# Patient Record
Sex: Male | Born: 1950 | Race: White | Hispanic: No | Marital: Single | Smoking: Never smoker
Health system: Southern US, Community
[De-identification: ages and names within clinical notes are randomized; demographics above are authoritative.]

## PROBLEM LIST (undated history)

## (undated) DIAGNOSIS — E538 Deficiency of other specified B group vitamins: Secondary | ICD-10-CM

## (undated) DIAGNOSIS — C8800 Waldenstrom macroglobulinemia not having achieved remission: Secondary | ICD-10-CM

## (undated) DIAGNOSIS — F0390 Unspecified dementia without behavioral disturbance: Secondary | ICD-10-CM

## (undated) DIAGNOSIS — I7789 Other specified disorders of arteries and arterioles: Secondary | ICD-10-CM

## (undated) DIAGNOSIS — H409 Unspecified glaucoma: Secondary | ICD-10-CM

## (undated) DIAGNOSIS — R350 Frequency of micturition: Secondary | ICD-10-CM

## (undated) DIAGNOSIS — N400 Enlarged prostate without lower urinary tract symptoms: Secondary | ICD-10-CM

## (undated) DIAGNOSIS — I10 Essential (primary) hypertension: Secondary | ICD-10-CM

## (undated) DIAGNOSIS — G629 Polyneuropathy, unspecified: Secondary | ICD-10-CM

## (undated) DIAGNOSIS — E119 Type 2 diabetes mellitus without complications: Secondary | ICD-10-CM

## (undated) HISTORY — DX: Frequency of micturition: R35.0

## (undated) HISTORY — DX: Deficiency of other specified B group vitamins: E53.8

## (undated) HISTORY — DX: Other specified disorders of arteries and arterioles: I77.89

## (undated) HISTORY — PX: KNEE ARTHROSCOPY: SUR90

## (undated) HISTORY — DX: Essential (primary) hypertension: I10

## (undated) HISTORY — PX: LASIK: SHX215

## (undated) HISTORY — PX: COLONOSCOPY: SHX174

## (undated) HISTORY — PX: CATARACT EXTRACTION W/ INTRAOCULAR LENS IMPLANT: SHX1309

---

## 2015-07-01 ENCOUNTER — Encounter: Payer: Self-pay | Admitting: *Deleted

## 2015-07-01 ENCOUNTER — Emergency Department
Admission: EM | Admit: 2015-07-01 | Discharge: 2015-07-01 | Disposition: A | Discharge status: Home / Self Care | Payer: 59 | Attending: Emergency Medicine | Admitting: Emergency Medicine

## 2015-07-01 ENCOUNTER — Emergency Department: Payer: 59

## 2015-07-01 DIAGNOSIS — R410 Disorientation, unspecified: Secondary | ICD-10-CM | POA: Insufficient documentation

## 2015-07-01 DIAGNOSIS — R739 Hyperglycemia, unspecified: Secondary | ICD-10-CM

## 2015-07-01 DIAGNOSIS — R61 Generalized hyperhidrosis: Secondary | ICD-10-CM | POA: Insufficient documentation

## 2015-07-01 DIAGNOSIS — G478 Other sleep disorders: Secondary | ICD-10-CM | POA: Insufficient documentation

## 2015-07-01 DIAGNOSIS — E538 Deficiency of other specified B group vitamins: Secondary | ICD-10-CM | POA: Diagnosis not present

## 2015-07-01 DIAGNOSIS — H409 Unspecified glaucoma: Secondary | ICD-10-CM | POA: Insufficient documentation

## 2015-07-01 DIAGNOSIS — G479 Sleep disorder, unspecified: Secondary | ICD-10-CM

## 2015-07-01 DIAGNOSIS — R413 Other amnesia: Secondary | ICD-10-CM | POA: Diagnosis present

## 2015-07-01 HISTORY — DX: Unspecified glaucoma: H40.9

## 2015-07-01 HISTORY — DX: Benign prostatic hyperplasia without lower urinary tract symptoms: N40.0

## 2015-07-01 LAB — COMPREHENSIVE METABOLIC PANEL
ALBUMIN: 3.8 g/dL (ref 3.5–5.0)
ALT: 11 U/L — ABNORMAL LOW (ref 17–63)
ANION GAP: 8 (ref 5–15)
AST: 18 U/L (ref 15–41)
Alkaline Phosphatase: 52 U/L (ref 38–126)
BILIRUBIN TOTAL: 0.7 mg/dL (ref 0.3–1.2)
BUN: 13 mg/dL (ref 6–20)
CALCIUM: 9.2 mg/dL (ref 8.9–10.3)
CO2: 28 mmol/L (ref 22–32)
Chloride: 103 mmol/L (ref 101–111)
Creatinine, Ser: 1.13 mg/dL (ref 0.61–1.24)
GLUCOSE: 152 mg/dL — AB (ref 65–99)
POTASSIUM: 3.8 mmol/L (ref 3.5–5.1)
Sodium: 139 mmol/L (ref 135–145)
TOTAL PROTEIN: 8.2 g/dL — AB (ref 6.5–8.1)

## 2015-07-01 LAB — ETHANOL: Alcohol, Ethyl (B): <5 mg/dL (ref <5)

## 2015-07-01 LAB — CBC
HCT: 42.3 % (ref 40.0–52.0)
HEMOGLOBIN: 13.7 g/dL (ref 13.0–18.0)
MCH: 30.2 pg (ref 26.0–34.0)
MCHC: 32.4 g/dL (ref 32.0–36.0)
MCV: 93.2 fL (ref 80.0–100.0)
Platelets: 299 10*3/uL (ref 150–440)
RBC: 4.54 MIL/uL (ref 4.40–5.90)
RDW: 13.5 % (ref 11.5–14.5)
WBC: 7 10*3/uL (ref 3.8–10.6)

## 2015-07-01 LAB — URINE DRUG SCREEN, QUALITATIVE (ARMC ONLY)
AMPHETAMINES, UR SCREEN: NOT DETECTED
BENZODIAZEPINE, UR SCRN: NOT DETECTED
Barbiturates, Ur Screen: NOT DETECTED
CANNABINOID 50 NG, UR ~~LOC~~: NOT DETECTED
Cocaine Metabolite,Ur ~~LOC~~: NOT DETECTED
MDMA (Ecstasy)Ur Screen: NOT DETECTED
METHADONE SCREEN, URINE: NOT DETECTED
OPIATE, UR SCREEN: NOT DETECTED
PHENCYCLIDINE (PCP) UR S: NOT DETECTED
TRICYCLIC, UR SCREEN: NOT DETECTED

## 2015-07-01 NOTE — ED Notes (Signed)
Patient reports His doctor in [place] had drawn labs and reported he needed vitamin B-12. Patient came to [place] to visit parents and came to ER to receive B-12 injection if needed. Patient has glaucoma and has some blurry vision and is currently on meds for Glaucoma.

## 2015-07-01 NOTE — ED Notes (Addendum)
Pt reports has had confusion over past few months over dates/numbers/calendars. PCP in [place], [place] believes it is due to Vitamin B12 deficiencies and is needing injections.

## 2015-07-01 NOTE — ED Provider Notes (Signed)
[facility] Emergency Department Provider Note  ____________________________________________  Time seen: Approximately 5:43 PM  I have reviewed the triage vital signs and the nursing notes.   HISTORY  Chief Complaint Memory Loss    HPI Adrian Valdez is a 64 y.o. male with a history of BPH and glaucoma presenting for 2 years of progressive memory loss. The patient normally lives in [place] and is currently living with his parents; they are in the emergency department today "to get evaluated faster because the primary care doctor cannot see [place] until next week." Both the patient and his father who are here denies any new symptoms today. The patient describes that over the past 2 years she has had a progressive decline in his vision due to glaucoma which has made it difficult for him to drive to work. In addition he has had episodes where he sees numbers" it turns into a different number." His father states that the patient has difficulty performing his ADLs and cannot administer his own medications." He is not safe to live by himself as why he is living with [place]." The patient has been evaluated by his primary care physician in [place] and has blood work including negative HIV negative RPR CBC and chemistry that are within normal limits. He has a vitamin B12 deficiency and would like to get this addressed today. The patient denies any fever, chills, acute trauma, substance abuse, nausea or vomiting, diarrhea, abdominal pain, chest pain, palpitations. The patient and his father are both difficult historians have difficulty keeping on track with the story. The patient doesn't have any history of psychiatric illness. He does state that he is having difficulty sleeping and usually sleeps 1-2 hours per night.   Past Medical History  Diagnosis Date  . Glaucoma   . 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  There are  no active problems to display for this patient.   Past Surgical History  Procedure Laterality Date  . Knee arthroscopy      No current outpatient prescriptions on file.  Allergies Review of patient's allergies indicates not on file.  No family history on file.  Social History Social History  Substance Use Topics  . Smoking status: Never Smoker   . Smokeless tobacco: None  . Alcohol Use: No    Review of Systems Constitutional: No fever/chills. No lightheadedness or syncope. Positive for progressive decline in ability to perform ADLs Eyes: No visual changes. ENT: No sore throat. Cardiovascular: Denies chest pain, palpitations. Respiratory: Denies shortness of breath.  No cough. Gastrointestinal: No abdominal pain.  No nausea, no vomiting.  No diarrhea.  No constipation. Genitourinary: Negative for dysuria. Musculoskeletal: Negative for back pain. Skin: Negative for rash. Neurological: Negative for headaches, focal weakness or numbness. Psychiatric:Positive sleep disturbance.  10-point ROS otherwise negative.  ____________________________________________   PHYSICAL EXAM:  VITAL SIGNS: ED Triage Vitals  Enc Vitals Group     BP 07/01/15 1419 148/92 mmHg     Pulse Rate 07/01/15 1419 78     Resp 07/01/15 1419 16     Temp 07/01/15 1419 98.6 F (37 C)     Temp Source 07/01/15 1419 Oral     SpO2 07/01/15 1419 99 %     Weight 07/01/15 1419 205 lb (92.987 kg)     Height 07/01/15 1419 5\' 11"  (1.803 m)     Head Cir --      Peak Flow --  Pain Score --      Pain Loc --      Pain Edu? --      Excl. in [place]? --     Constitutional: Patient is sitting in the bed with some mild diaphoresis, on affect, and difficulty making eye contact. He is alert and oriented and answers questions appropriately although he does get off track.  Eyes: Conjunctivae are normal.  EOMI. no scleral icterus. No discharge. Head: Atraumatic. No raccoon eyes or Battle sign. Nose: No  congestion/rhinnorhea. Mouth/Throat: Mucous membranes are moist.  Neck: No stridor.  Supple.  Trachea is midline. No JVD. Cardiovascular: Normal rate, regular rhythm. No murmurs, rubs or gallops.  Respiratory: Normal respiratory effort.  No retractions. Lungs CTAB.  No wheezes, rales or ronchi. Gastrointestinal: Soft and nontender. No distention. No peritoneal signs. Musculoskeletal: No LE edema.  Neurologic:  Patient is alert and oriented 3. Speech is clear. Face and smile are symmetric. Extra movements are intact and PERRLA. Neck is supple. Moves all extremity swell. Walks without ataxia.  Skin:  Skin is warm, dry and intact. No rash noted. Psychiatric: Patient has a bizarre affect and circuitous thought process. He does have reasonable judgment and is able to answer questions appropriately. He has good insight into why he is here and what his goals of care are.   ____________________________________________   LABS (all labs ordered are listed, but only abnormal results are displayed)  Labs Reviewed  COMPREHENSIVE METABOLIC PANEL - Abnormal; Notable for the following:    Glucose, Bld 152 (*)    Total Protein 8.2 (*)    ALT 11 (*)    All other components within normal limits  CBC  URINE DRUG SCREEN, QUALITATIVE ([facility] ONLY)  ETHANOL   ____________________________________________  EKG  Not indicated ____________________________________________  RADIOLOGY  Ct Head Wo Contrast  07/01/2015  CLINICAL DATA:  64 year old male with confusion for several months. EXAM: CT HEAD WITHOUT CONTRAST TECHNIQUE: Contiguous axial images were obtained from the base of the skull through the vertex without intravenous contrast. COMPARISON:  None. FINDINGS: Mild generalized cerebral atrophy noted. No acute intracranial abnormalities are identified, including mass lesion or mass effect, hydrocephalus, extra-axial fluid collection, midline shift, hemorrhage, or acute infarction. The visualized bony  calvarium is unremarkable. IMPRESSION: No evidence of acute intracranial abnormality. Mild atrophy. Electronically Signed   By: Margarette Canada M.D.   On: 07/01/2015 16:53    ____________________________________________   PROCEDURES  Procedure(s) performed: None  Critical Care performed: No ____________________________________________   INITIAL IMPRESSION / ASSESSMENT AND PLAN / ED COURSE  Pertinent labs & imaging results that were available during my care of the patient were reviewed by me and considered in my medical decision making (see chart for details).  64 y.o. male with a history of glaucoma and BPH presenting with a 2 year decline in ability to do ADLs. On my exam, the patient does have a bizarre affect and some difficulty with staying on course of the conversation. However he does not have any acute illness or any acute symptoms. There are no focal neurologic deficits that would be concerning for mass or CVA. I will check basic labs and get a CT of the head as the patient has not had his brain imaged. It is likely that an MRI will be more helpful in this case. If his workup in the emergency department is negative, I'll plan to speak with an internist to see if they're able to see him in clinic sooner than next  week.  ----------------------------------------- 6:00 PM on 07/01/2015 -----------------------------------------  The patient's workup in the emergency department is reassuring. His CT scan does not show any abnormalities and he does not have any electrolyte abnormalities or positive drug screen. Does have some isolated hyperglycemia. I've spoken with Dr. Gilford Rile at the [facility] clinic who will send a text message to Dr. Ouida Sills to see if he is able to move up the patient's appointment, which ER he has for next Tuesday. I have discussed the plan and the results with the patient and his father who are in agreement. Plan  discharge. ____________________________________________  FINAL CLINICAL IMPRESSION(S) / ED DIAGNOSES  Final diagnoses:  Confusion  Glaucoma  Sleep disturbance  B12 deficiency  Hyperglycemia      NEW MEDICATIONS STARTED DURING THIS VISIT:  New Prescriptions   No medications on file     Eula Listen, MD 07/01/15 1800

## 2015-07-01 NOTE — Discharge Instructions (Signed)
Please return to the emergency department if you have acute changes in your mental status, new confusion, headache, numbness, tingling, difficulty walking, inability to keep down fluids, fever or any other symptoms concerning to you.  Confusion Confusion is the inability to think with your usual speed or clarity. Confusion may come on quickly or slowly over time. How quickly the confusion comes on depends on the cause. Confusion can be due to any number of causes. CAUSES   Concussion, head injury, or head trauma.  Seizures.  Stroke.  Fever.  Brain tumor.  Age related decreased brain function (dementia).  Heightened emotional states like rage or terror.  Mental illness in which the person loses the ability to determine what is real and what is not (hallucinations).  Infections such as a urinary tract infection (UTI).  Toxic effects from alcohol, drugs, or prescription medicines.  Dehydration and an imbalance of salts in the body (electrolytes).  Lack of sleep.  Low blood sugar (diabetes).  Low levels of oxygen from conditions such as chronic lung disorders.  Drug interactions or other medicine side effects.  Nutritional deficiencies, especially niacin, thiamine, vitamin C, or vitamin B.  Sudden drop in body temperature (hypothermia).  Change in routine, such as when traveling or hospitalized. SIGNS AND SYMPTOMS  People often describe their thinking as cloudy or unclear when they are confused. Confusion can also include feeling disoriented. That means you are unaware of where or who you are. You may also not know what the date or time is. If confused, you may also have difficulty paying attention, remembering, and making decisions. Some people also act aggressively when they are confused.  DIAGNOSIS  The medical evaluation of confusion may include:  Blood and urine tests.  X-rays.  Brain and nervous system tests.  Analyzing your brain waves (electroencephalogram or  EEG).  Magnetic resonance imaging (MRI) of your head.  Computed tomography (CT) scan of your head.  Mental status tests in which your health care provider may ask many questions. Some of these questions may seem silly or strange, but they are a very important test to help diagnose and treat confusion. TREATMENT  An admission to the hospital may not be needed, but a person with confusion should not be left alone. Stay with a family member or friend until the confusion clears. Avoid alcohol, pain relievers, or sedative drugs until you have fully recovered. Do not drive until directed by your health care provider. HOME CARE INSTRUCTIONS  What family and friends can do:  To find out if someone is confused, ask the person to state his or her name, age, and the date. If the person is unsure or answers incorrectly, he or she is confused.  Always introduce yourself, no matter how well the person knows you.  Often remind the person of his or her location.  Place a calendar and clock near the confused person.  Help the person with his or her medicines. You may want to use a pill box, an alarm as a reminder, or give the person each dose as prescribed.  Talk about current events and plans for the day.  Try to keep the environment calm, quiet, and peaceful.  Make sure the person keeps follow-up visits with his or her health care provider. PREVENTION  Ways to prevent confusion:  Avoid alcohol.  Eat a balanced diet.  Get enough sleep.  Take medicine only as directed by your health care provider.  Do not become isolated. Spend time with other  people and make plans for your days.  Keep careful watch on your blood sugar levels if you are diabetic. SEEK IMMEDIATE MEDICAL CARE IF:   You develop severe headaches, repeated vomiting, seizures, blackouts, or slurred speech.  There is increasing confusion, weakness, numbness, restlessness, or personality changes.  You develop a loss of balance,  have marked dizziness, feel uncoordinated, or fall.  You have delusions, hallucinations, or develop severe anxiety.  Your family members think you need to be rechecked.   This information is not intended to replace advice given to you by your health care provider. Make sure you discuss any questions you have with your health care provider.   Document Released: 09/29/2004 Document Revised: 09/12/2014 Document Reviewed: 09/27/2013 Elsevier Interactive Patient Education [employer].

## 2015-07-07 ENCOUNTER — Other Ambulatory Visit
Admission: RE | Admit: 2015-07-07 | Discharge: 2015-07-07 | Disposition: A | Discharge status: Home / Self Care | Payer: 59 | Source: Ambulatory Visit | Attending: Internal Medicine | Admitting: Internal Medicine

## 2015-07-07 DIAGNOSIS — F321 Major depressive disorder, single episode, moderate: Secondary | ICD-10-CM | POA: Insufficient documentation

## 2015-07-07 DIAGNOSIS — R410 Disorientation, unspecified: Secondary | ICD-10-CM | POA: Diagnosis present

## 2015-07-07 DIAGNOSIS — I10 Essential (primary) hypertension: Secondary | ICD-10-CM | POA: Insufficient documentation

## 2015-07-07 DIAGNOSIS — F4489 Other dissociative and conversion disorders: Secondary | ICD-10-CM | POA: Insufficient documentation

## 2015-07-07 DIAGNOSIS — R350 Frequency of micturition: Secondary | ICD-10-CM | POA: Insufficient documentation

## 2015-07-07 LAB — AMMONIA: AMMONIA: 15 umol/L (ref 9–35)

## 2015-07-08 ENCOUNTER — Other Ambulatory Visit: Payer: Self-pay | Admitting: Internal Medicine

## 2015-07-08 DIAGNOSIS — R410 Disorientation, unspecified: Secondary | ICD-10-CM

## 2015-07-21 ENCOUNTER — Ambulatory Visit
Admission: RE | Admit: 2015-07-21 | Discharge: 2015-07-21 | Disposition: A | Discharge status: Home / Self Care | Payer: 59 | Source: Ambulatory Visit | Attending: Internal Medicine | Admitting: Internal Medicine

## 2015-07-21 DIAGNOSIS — R899 Unspecified abnormal finding in specimens from other organs, systems and tissues: Secondary | ICD-10-CM | POA: Diagnosis present

## 2015-07-21 DIAGNOSIS — R410 Disorientation, unspecified: Secondary | ICD-10-CM | POA: Diagnosis present

## 2015-07-24 ENCOUNTER — Inpatient Hospital Stay: Payer: 59

## 2015-07-24 ENCOUNTER — Encounter: Payer: Self-pay | Admitting: Internal Medicine

## 2015-07-24 ENCOUNTER — Inpatient Hospital Stay: Payer: 59 | Attending: Internal Medicine | Admitting: Internal Medicine

## 2015-07-24 VITALS — BP 148/90 | HR 65 | Temp 98.8°F | Resp 18 | Ht 71.0 in | Wt 200.8 lb

## 2015-07-24 DIAGNOSIS — D472 Monoclonal gammopathy: Secondary | ICD-10-CM | POA: Insufficient documentation

## 2015-07-24 DIAGNOSIS — R21 Rash and other nonspecific skin eruption: Secondary | ICD-10-CM | POA: Insufficient documentation

## 2015-07-24 DIAGNOSIS — G319 Degenerative disease of nervous system, unspecified: Secondary | ICD-10-CM | POA: Insufficient documentation

## 2015-07-24 DIAGNOSIS — E538 Deficiency of other specified B group vitamins: Secondary | ICD-10-CM | POA: Diagnosis not present

## 2015-07-24 DIAGNOSIS — R899 Unspecified abnormal finding in specimens from other organs, systems and tissues: Secondary | ICD-10-CM

## 2015-07-24 DIAGNOSIS — N401 Enlarged prostate with lower urinary tract symptoms: Secondary | ICD-10-CM | POA: Insufficient documentation

## 2015-07-24 DIAGNOSIS — R350 Frequency of micturition: Secondary | ICD-10-CM | POA: Insufficient documentation

## 2015-07-24 DIAGNOSIS — Z79899 Other long term (current) drug therapy: Secondary | ICD-10-CM | POA: Diagnosis not present

## 2015-07-24 DIAGNOSIS — R410 Disorientation, unspecified: Secondary | ICD-10-CM | POA: Insufficient documentation

## 2015-07-24 DIAGNOSIS — D649 Anemia, unspecified: Secondary | ICD-10-CM | POA: Insufficient documentation

## 2015-07-24 LAB — CBC WITH DIFFERENTIAL/PLATELET
Basophils Absolute: 0.1 10*3/uL (ref 0–0.1)
Basophils Relative: 1 %
EOS ABS: 0.2 10*3/uL (ref 0–0.7)
EOS PCT: 3 %
HCT: 41.4 % (ref 40.0–52.0)
HEMOGLOBIN: 13.6 g/dL (ref 13.0–18.0)
LYMPHS ABS: 1.4 10*3/uL (ref 1.0–3.6)
LYMPHS PCT: 20 %
MCH: 30.5 pg (ref 26.0–34.0)
MCHC: 32.9 g/dL (ref 32.0–36.0)
MCV: 92.5 fL (ref 80.0–100.0)
MONOS PCT: 8 %
Monocytes Absolute: 0.6 10*3/uL (ref 0.2–1.0)
Neutro Abs: 4.8 10*3/uL (ref 1.4–6.5)
Neutrophils Relative %: 68 %
PLATELETS: 331 10*3/uL (ref 150–440)
RBC: 4.48 MIL/uL (ref 4.40–5.90)
RDW: 13.4 % (ref 11.5–14.5)
WBC: 7 10*3/uL (ref 3.8–10.6)

## 2015-07-24 NOTE — Progress Notes (Signed)
[place] @ [facility] Telephone:([phone]  Fax:([phone]) [place] OB: 1951-07-14  MR#: [id number]  FVC#:[id number]  Patient Care Team: Kirk Ruths, MD as PCP - General (Internal Medicine)  CHIEF COMPLAINT:  Chief Complaint  Patient presents with  . New Evaluation  . abnormal blood work SPEP     No history exists.    No flowsheet data found.  HISTORY OF PRESENT ILLNESS:  Adrian Valdez was referred to our clinic for evaluation of newly discovered IgM kappa monoclonal protein. He recently moved to [facility] from [place], and was seen in the emergency room 2 weeks ago for concerns of progressing inability to perform activities of daily living, along with memory problems. He had CT of the head along with MRI of the head, which did not reveal any significant abnormalities, but total protein was elevated, so he was referred to Dr. Ouida Sills with [facility], who performed additional workup, which revealed presence of serum IgM kappa monoclonal protein at 1.7 g/dL, along with urine monoclonal protein. Also, reportedly patient was diagnosed with B12 deficiency, for which he received 2 injections of vitamin B12 over the past months, but no formal repletion has been done. Currently Adrian Valdez feels well. He denies any significant complaints except for acneform rash over his forehead and cheeks, which he attributes to using after shave cream which he purchased recently. Specifically, he denies weight loss, lymphadenopathy, nausea, vomiting, diarrhea, constipation, night sweats. He denies bone pain, but admits to urinary frequency.  REVIEW OF SYSTEMS:   Review of Systems  All other systems reviewed and are negative.    PAST MEDICAL HISTORY: Past Medical History  Diagnosis Date  . Glaucoma   . Enlarged prostate     PAST SURGICAL HISTORY: Past Surgical History  Procedure Laterality Date  . Knee arthroscopy      FAMILY HISTORY Family History  Problem  Relation Age of Onset  . Hypertension Mother   . Hypertension Father   . CAD Father   . Breast cancer Maternal Grandmother   . Ovarian cancer Paternal Grandmother     ADVANCED DIRECTIVES:  No flowsheet data found.  HEALTH MAINTENANCE: Social History  Substance Use Topics  . Smoking status: Never Smoker   . Smokeless tobacco: Never Used  . Alcohol Use: Yes     Comment: social drinker wine couple of times a year     Allergies  Allergen Reactions  . Oxybutynin Other (See Comments)    Headache    Current Outpatient Prescriptions  Medication Sig Dispense Refill  . ciprofloxacin (CIPRO) 500 MG tablet   0  . finasteride (PROSCAR) 5 MG tablet Take 5 mg by mouth daily.    . methazolamide (NEPTAZANE) 50 MG tablet TK 1 T PO BID  1   No current facility-administered medications for this visit.    OBJECTIVE:  There were no vitals filed for this visit.   There is no weight on file to calculate BMI.    ECOG FS:0 - Asymptomatic  Physical Exam  Constitutional: He is oriented to person, place, and time and well-developed, well-nourished, and in no distress. No distress.  HENT:  Head: Normocephalic and atraumatic.  Right Ear: External ear normal.  Left Ear: External ear normal.  Nose: Nose normal.  Mouth/Throat: Oropharynx is clear and moist. No oropharyngeal exudate.  Eyes: Conjunctivae and EOM are normal. Pupils are equal, round, and reactive to light. Right eye exhibits no discharge. Left eye exhibits no discharge. No  scleral icterus.  Neck: Normal range of motion. Neck supple. No JVD present. No tracheal deviation present. No thyromegaly present.  Cardiovascular: Normal rate, regular rhythm, normal heart sounds and intact distal pulses.  Exam reveals no gallop and no friction rub.   No murmur heard. Pulmonary/Chest: Effort normal and breath sounds normal. No stridor. No respiratory distress. He has no wheezes. He has no rales. He exhibits no tenderness.  Abdominal: Soft. Bowel  sounds are normal. He exhibits no distension and no mass. There is no tenderness. There is no rebound and no guarding.  Genitourinary:  Patient deferred  Musculoskeletal: Normal range of motion. He exhibits no edema or tenderness.  Lymphadenopathy:    He has no cervical adenopathy.  Neurological: He is alert and oriented to person, place, and time. He has normal reflexes. No cranial nerve deficit. He exhibits normal muscle tone. Gait normal. Coordination normal. GCS score is 15.  Skin: Skin is warm and dry. No rash noted. He is not diaphoretic. No erythema. No pallor.  Psychiatric: Mood, memory, affect and judgment normal.     LAB RESULTS:  CBC Latest Ref Rng 07/01/2015  WBC 3.8 - 10.6 K/uL 7.0  Hemoglobin 13.0 - 18.0 g/dL 13.7  Hematocrit 40.0 - 52.0 % 42.3  Platelets 150 - 440 K/uL 299    No visits with results within 5 Day(s) from this visit. Latest known visit with results is:  Hospital Outpatient Visit on 07/07/2015  Component Date Value Ref Range Status  . Ammonia 07/07/2015 15  9 - 35 umol/L Final   Care Everywhere Result Report  IFE and PE, Serum - [facility]11/11/2014  [facility]  Component Name Value Range  Total IgG - [facility] 598 (L) 700 - 1600 mg/dL  Immunoglobulin A, Qn, Serum - [facility] 85 61 - 437 mg/dL  IgM - [facility] 2521 (H) Comment: Results confirmed on dilution. 20 - 172 mg/dL  Protein Total - [facility] 7.1 6.0 - 8.5 g/dL  Albumin - [facility] 3.5 2.9 - 4.4 g/dL  Alpha-1-Globulin - [facility] 0.2 0.0 - 0.4 g/dL  Alpha-2-Globulin - [facility] 0.5 0.4 - 1.0 g/dL  Beta Globulin - [facility] 0.7 0.7 - 1.3 g/dL  Gamma Globulin - [facility] 2.2 (H) 0.4 - 1.8 g/dL  M-Spike - [facility] 1.7 (H) Not Observed g/dL  Globulin, Total - [facility] 3.6 2.2 - 3.9 g/dL  A/G Ratio - [facility] 1 0.7 - 1.7   IFE 1 - [facility] Comment Comment: Immunofixation shows IgM monoclonal protein with kappa light chain specificity.   Please note: - [facility] Comment Comment: Protein  electrophoresis scan will follow via computer, mail, or courier delivery.    CBC from 07/07/2015: WBC 6.8, RBC 4.5, hemoglobin 13.7, hematocrit 42.7, MCV 95.4, platelet count 320, absolute neutrophil count 4.26, absolute lymphocyte count 1.67, absolute monocyte count 0.66. CMP from 07/07/2015: Glucose 114, sodium 137, potassium 4.4, chloride 99, bicarbonate 30.1, BUN 10, creatinine 0.8, calcium 9.3, AST 13, ALT 9, alkaline phosphatase 45, albumin 3.9, total bilirubin 0.5, total protein 8.0, Quantitative immunoglobulins from 07/07/2015: IgG 598 (below normal), IgA 85 (within normal limits), IgM 2521 (above upper limits of normal)  Urine protein electrophoresis from 07/09/2015: M spike 32% percent TSH 1.1 (within normal) Sedimentation rate 91 (above upper limits of normal)  STUDIES: Ct Head Wo Contrast  07/01/2015  CLINICAL DATA:  64 year old male with confusion for several months. EXAM: CT HEAD WITHOUT CONTRAST TECHNIQUE: Contiguous axial images were obtained from the base of the skull through the vertex without intravenous contrast.  COMPARISON:  None. FINDINGS: Mild generalized cerebral atrophy noted. No acute intracranial abnormalities are identified, including mass lesion or mass effect, hydrocephalus, extra-axial fluid collection, midline shift, hemorrhage, or acute infarction. The visualized bony calvarium is unremarkable. IMPRESSION: No evidence of acute intracranial abnormality. Mild atrophy. Electronically Signed   By: Margarette Canada M.D.   On: 07/01/2015 16:53   Mr Jodene Valdez Head Wo Contrast  07/21/2015  CLINICAL DATA:  Altered mental status.  Confusion. EXAM: MRA HEAD WITHOUT CONTRAST TECHNIQUE: Angiographic images of the Circle of Willis were obtained using MRA technique without intravenous contrast. COMPARISON:  CT head 07/01/2015 FINDINGS: Teddy Spike is tortuous. This degrades image quality and causes decreased flow related signal in the tortuous vessels especially involving the middle  cerebral artery branches and left posterior cerebral artery. There is also decreased signal in the distal right vertebral artery which may be due to tortuosity. Allowing for these limitations, no definite intracranial atherosclerotic disease or aneurysm. IMPRESSION: Image quality is limited by tortuosity causing decreased signal in the circle Willis. Allowing for this, no large vessel occlusion or flow limiting stenosis is identified. If the patient has cerebral vascular symptoms, CTA may be a better test to evaluate the circle Willis. Electronically Signed   By: Franchot Gallo M.D.   On: 07/21/2015 09:59   I personally reviewed the available images.  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 They may include monoclonal gammopathy of undetermined significance, reactive monoclonal gammopathy, Walden strum disease and IgM multiple myeloma. We will perform a workup, including CT chest, abdomen, pelvis with contrast to evaluate for lymphadenopathy. We will also perform a bone survey to look for lytic bone lesions. We will measure serum free light chains, and at urine protein electrophoresis with immunofixation, to better characterize urine monoclonal protein. At this point we will not plan on performing bone marrow biopsy, but may need to do it if unable to clearly explain his anemia.  Anemia-anemia could be multifactorial, including vitamin B12 deficiency of plasma cell dyscrasia related. We will repeat vitamin B12 levels, check methylmalonic acid and RBC folate levels. We will not pursue bone marrow biopsy at this point, but if anemia persists and worsens, we may need to perform bone marrow biopsy in order to rule out bone marrow involvement with plasma cell dyscrasia or Waldenstrom disease.  Confusion-the exact  etiology is unclear. During regular interview Adrian Valdez appears to be logical, appropriate without bizarre ideation, with normal thought process. He may need to be evaluated by a memory specialist or psychiatrist if symptoms continue to worsen. We will check methylmalonic acid levels, to see if he could have underlying vitamin B12 deficiency as the reason for his neurologic/psychiatric issues.    Patient expressed understanding and was in agreement with this plan. He also understands that He can call clinic at any time with any questions, concerns, or complaints.    No matching staging information was found for the patient.  Roxana Hires, MD   07/24/2015 3:12 PM

## 2015-07-25 LAB — VITAMIN B12: VITAMIN B 12: 154 pg/mL — AB (ref 180–914)

## 2015-07-27 ENCOUNTER — Inpatient Hospital Stay: Payer: 59

## 2015-07-27 ENCOUNTER — Encounter: Payer: Self-pay | Admitting: *Deleted

## 2015-07-27 ENCOUNTER — Other Ambulatory Visit: Payer: Self-pay | Admitting: *Deleted

## 2015-07-27 ENCOUNTER — Telehealth: Payer: Self-pay | Admitting: *Deleted

## 2015-07-27 DIAGNOSIS — D472 Monoclonal gammopathy: Secondary | ICD-10-CM | POA: Diagnosis not present

## 2015-07-27 DIAGNOSIS — E538 Deficiency of other specified B group vitamins: Secondary | ICD-10-CM

## 2015-07-27 HISTORY — DX: Deficiency of other specified B group vitamins: E53.8

## 2015-07-27 LAB — PROTEIN ELECTROPHORESIS, SERUM
A/G Ratio: 0.7 (ref 0.7–1.7)
ALPHA-1-GLOBULIN: 0.3 g/dL (ref 0.0–0.4)
ALPHA-2-GLOBULIN: 0.7 g/dL (ref 0.4–1.0)
Albumin ELP: 3.4 g/dL (ref 2.9–4.4)
BETA GLOBULIN: 0.8 g/dL (ref 0.7–1.3)
Gamma Globulin: 2.9 g/dL — ABNORMAL HIGH (ref 0.4–1.8)
Globulin, Total: 4.7 g/dL — ABNORMAL HIGH (ref 2.2–3.9)
M-Spike, %: 2.4 g/dL — ABNORMAL HIGH
Total Protein ELP: 8.1 g/dL (ref 6.0–8.5)

## 2015-07-27 LAB — KAPPA/LAMBDA LIGHT CHAINS
KAPPA FREE LGHT CHN: 56.65 mg/L — AB (ref 3.30–19.40)
Kappa, lambda light chain ratio: 5.27 — ABNORMAL HIGH (ref 0.26–1.65)
LAMDA FREE LIGHT CHAINS: 10.75 mg/L (ref 5.71–26.30)

## 2015-07-27 LAB — FOLATE RBC
FOLATE, RBC: 979 ng/mL (ref >498)
Folate, Hemolysate: 408.2 ng/mL
HEMATOCRIT: 41.7 % (ref 37.5–51.0)

## 2015-07-27 LAB — IFE AND PE, RANDOM URINE
% BETA, Urine: 30.3 %
ALBUMIN, U: 9.6 %
ALPHA 1 URINE: 6.2 %
ALPHA 2 UR: 11.4 %
GAMMA GLOBULIN URINE: 42.5 %
M-SPIKE %, Urine: 30.8 % — ABNORMAL HIGH
TOTAL PROTEIN, URINE-UPE24: 9.5 mg/dL

## 2015-07-27 MED ORDER — CYANOCOBALAMIN 1000 MCG/ML IJ SOLN
1000.0000 ug | Freq: Once | INTRAMUSCULAR | Status: AC
  Administered 2015-07-27: 1000 ug via INTRAMUSCULAR
  Filled 2015-07-27: qty 1

## 2015-07-27 NOTE — Telephone Encounter (Signed)
Per Dr. Rudean Hitt pt was dx with b12 def nad only got one injection at [facility].  Per md pt needs start up injections at first dx. And wants the patient to come today, tues and wed of this week and the rest of the week the office is closed for thanksgiving.  Then mon and tues. Of next week.  I called home and spoke to the pt's Dad who comes with him due to pt's confusion.  They will be here today and get appt sch. Upstairs where after checking in downstairs where they need to be so that schedulers can give him all the other appts.Marland Kitchen

## 2015-07-28 ENCOUNTER — Inpatient Hospital Stay: Payer: 59

## 2015-07-28 DIAGNOSIS — D472 Monoclonal gammopathy: Secondary | ICD-10-CM | POA: Diagnosis not present

## 2015-07-28 DIAGNOSIS — E538 Deficiency of other specified B group vitamins: Secondary | ICD-10-CM

## 2015-07-28 MED ORDER — CYANOCOBALAMIN 1000 MCG/ML IJ SOLN
1000.0000 ug | Freq: Once | INTRAMUSCULAR | Status: AC
  Administered 2015-07-28: 1000 ug via INTRAMUSCULAR
  Filled 2015-07-28: qty 1

## 2015-07-29 ENCOUNTER — Inpatient Hospital Stay: Payer: 59

## 2015-07-29 DIAGNOSIS — E538 Deficiency of other specified B group vitamins: Secondary | ICD-10-CM

## 2015-07-29 DIAGNOSIS — D472 Monoclonal gammopathy: Secondary | ICD-10-CM | POA: Diagnosis not present

## 2015-07-29 MED ORDER — CYANOCOBALAMIN 1000 MCG/ML IJ SOLN
1000.0000 ug | Freq: Once | INTRAMUSCULAR | Status: AC
  Administered 2015-07-29: 1000 ug via INTRAMUSCULAR
  Filled 2015-07-29: qty 1

## 2015-07-30 LAB — METHYLMALONIC ACID, SERUM: Methylmalonic Acid, Quantitative: 411 nmol/L — ABNORMAL HIGH (ref 0–378)

## 2015-08-03 ENCOUNTER — Inpatient Hospital Stay: Payer: 59

## 2015-08-03 VITALS — BP 157/92 | HR 70 | Temp 96.2°F

## 2015-08-03 DIAGNOSIS — D472 Monoclonal gammopathy: Secondary | ICD-10-CM | POA: Diagnosis not present

## 2015-08-03 DIAGNOSIS — E538 Deficiency of other specified B group vitamins: Secondary | ICD-10-CM

## 2015-08-03 MED ORDER — CYANOCOBALAMIN 1000 MCG/ML IJ SOLN
1000.0000 ug | Freq: Once | INTRAMUSCULAR | Status: AC
  Administered 2015-08-03: 1000 ug via INTRAMUSCULAR
  Filled 2015-08-03: qty 1

## 2015-08-04 ENCOUNTER — Inpatient Hospital Stay: Payer: 59

## 2015-08-04 DIAGNOSIS — E538 Deficiency of other specified B group vitamins: Secondary | ICD-10-CM

## 2015-08-04 DIAGNOSIS — D472 Monoclonal gammopathy: Secondary | ICD-10-CM | POA: Diagnosis not present

## 2015-08-04 MED ORDER — CYANOCOBALAMIN 1000 MCG/ML IJ SOLN
1000.0000 ug | Freq: Once | INTRAMUSCULAR | Status: AC
  Administered 2015-08-04: 1000 ug via INTRAMUSCULAR
  Filled 2015-08-04: qty 1

## 2015-08-12 ENCOUNTER — Ambulatory Visit
Admission: RE | Admit: 2015-08-12 | Discharge: 2015-08-12 | Disposition: A | Discharge status: Home / Self Care | Payer: 59 | Source: Ambulatory Visit | Attending: Internal Medicine | Admitting: Internal Medicine

## 2015-08-12 DIAGNOSIS — D472 Monoclonal gammopathy: Secondary | ICD-10-CM | POA: Diagnosis not present

## 2015-08-12 DIAGNOSIS — R899 Unspecified abnormal finding in specimens from other organs, systems and tissues: Secondary | ICD-10-CM | POA: Diagnosis not present

## 2015-08-12 DIAGNOSIS — K5730 Diverticulosis of large intestine without perforation or abscess without bleeding: Secondary | ICD-10-CM | POA: Diagnosis not present

## 2015-08-12 DIAGNOSIS — K7689 Other specified diseases of liver: Secondary | ICD-10-CM | POA: Insufficient documentation

## 2015-08-12 MED ORDER — IOHEXOL 300 MG/ML  SOLN
100.0000 mL | Freq: Once | INTRAMUSCULAR | Status: AC | PRN
  Administered 2015-08-12: 100 mL via INTRAVENOUS

## 2015-08-14 ENCOUNTER — Inpatient Hospital Stay: Payer: 59

## 2015-08-14 ENCOUNTER — Inpatient Hospital Stay: Payer: 59 | Attending: Internal Medicine | Admitting: Internal Medicine

## 2015-08-14 VITALS — BP 150/92 | HR 75 | Temp 98.0°F | Resp 18 | Ht 71.0 in | Wt 198.4 lb

## 2015-08-14 DIAGNOSIS — R21 Rash and other nonspecific skin eruption: Secondary | ICD-10-CM | POA: Diagnosis not present

## 2015-08-14 DIAGNOSIS — I10 Essential (primary) hypertension: Secondary | ICD-10-CM | POA: Diagnosis not present

## 2015-08-14 DIAGNOSIS — D472 Monoclonal gammopathy: Secondary | ICD-10-CM | POA: Diagnosis not present

## 2015-08-14 DIAGNOSIS — E538 Deficiency of other specified B group vitamins: Secondary | ICD-10-CM

## 2015-08-14 DIAGNOSIS — D519 Vitamin B12 deficiency anemia, unspecified: Secondary | ICD-10-CM | POA: Diagnosis not present

## 2015-08-14 DIAGNOSIS — I712 Thoracic aortic aneurysm, without rupture: Secondary | ICD-10-CM | POA: Insufficient documentation

## 2015-08-14 DIAGNOSIS — R410 Disorientation, unspecified: Secondary | ICD-10-CM | POA: Insufficient documentation

## 2015-08-14 DIAGNOSIS — R350 Frequency of micturition: Secondary | ICD-10-CM

## 2015-08-14 DIAGNOSIS — N400 Enlarged prostate without lower urinary tract symptoms: Secondary | ICD-10-CM

## 2015-08-14 DIAGNOSIS — N401 Enlarged prostate with lower urinary tract symptoms: Secondary | ICD-10-CM | POA: Insufficient documentation

## 2015-08-14 DIAGNOSIS — Z79899 Other long term (current) drug therapy: Secondary | ICD-10-CM | POA: Insufficient documentation

## 2015-08-14 MED ORDER — CYANOCOBALAMIN 1000 MCG/ML IJ SOLN
1000.0000 ug | Freq: Once | INTRAMUSCULAR | Status: AC
  Administered 2015-08-14: 1000 ug via INTRAMUSCULAR
  Filled 2015-08-14: qty 1

## 2015-08-14 NOTE — Addendum Note (Signed)
Addended by: Luella Cook on: 08/14/2015 06:05 PM   Modules accepted: Orders

## 2015-08-14 NOTE — Progress Notes (Signed)
[place] @ [facility] [phone]  Fax:([phone]) [place] OB: [birth year]  MR#: [id number]  OEH#:[id number]  Patient Care Team: Kirk Ruths, MD as PCP - General (Internal Medicine)  CHIEF COMPLAINT:  No chief complaint on file.    No history exists.    Oncology Flowsheet 07/27/2015 07/28/2015 07/29/2015 08/03/2015 08/04/2015  cyanocobalamin ((VITAMIN B-12)) IM 1,000 mcg 1,000 mcg 1,000 mcg 1,000 mcg 1,000 mcg    HISTORY OF PRESENT ILLNESS:  Adrian Valdez was referred to our clinic for evaluation of newly discovered IgM kappa monoclonal protein. He recently moved to [facility] from [place], and was seen in the emergency room 2 weeks ago for concerns of progressing inability to perform activities of daily living, along with memory problems. He had CT of the head along with MRI of the head, which did not reveal any significant abnormalities, but total protein was elevated, so he was referred to Dr. Ouida Sills with [facility], who performed additional workup, which revealed presence of serum IgM kappa monoclonal protein at 1.7 g/dL, along with urine monoclonal protein. Also, reportedly patient was diagnosed with B12 deficiency, for which he received 2 injections of vitamin B12 over the past months, but no formal repletion has been done.   Current status: Adrian Valdez returns to our clinic for a follow-up appointment to discuss the results of the workup. He feels well overall and he has not had any new complaints since his previous appointment. He denies any significant complaints except for acneform rash over his forehead and cheeks, which he attributes to using after shave cream which he purchased recently. Specifically, he denies weight loss, lymphadenopathy, nausea, vomiting, diarrhea, constipation, night sweats. He denies bone pain, but admits to urinary frequency.  REVIEW OF SYSTEMS:   Review of Systems  All other systems reviewed and are  negative.    PAST MEDICAL HISTORY: Past Medical History  Diagnosis Date  . Glaucoma   . Enlarged prostate   . Vitamin B 12 deficiency   . Hypertension   . Urinary frequency   . B12 deficiency 07/27/2015    PAST SURGICAL HISTORY: Past Surgical History  Procedure Laterality Date  . Knee arthroscopy Right     FAMILY HISTORY Family History  Problem Relation Age of Onset  . Hypertension Mother   . Hypertension Father   . CAD Father   . Breast cancer Maternal Grandmother   . Ovarian cancer Paternal Grandmother     ADVANCED DIRECTIVES:  No flowsheet data found.  HEALTH MAINTENANCE: Social History  Substance Use Topics  . Smoking status: Never Smoker   . Smokeless tobacco: Never Used  . Alcohol Use: Yes     Comment: social drinker wine couple of times a year     Allergies  Allergen Reactions  . Oxybutynin Other (See Comments)    Headache    Current Outpatient Prescriptions  Medication Sig Dispense Refill  . ciprofloxacin (CIPRO) 500 MG tablet   0  . finasteride (PROSCAR) 5 MG tablet Take 5 mg by mouth daily.    . methazolamide (NEPTAZANE) 50 MG tablet TK 1 T PO BID  1   No current facility-administered medications for this visit.    OBJECTIVE:  Filed Vitals:   08/14/15 1521  BP: 150/92  Pulse: 75  Temp: 98 F (36.7 C)  Resp: 18     Body mass index is 27.69 kg/(m^2).    ECOG FS:0 - Asymptomatic  Physical Exam  Constitutional: He is oriented  to person, place, and time and well-developed, well-nourished, and in no distress. No distress.  HENT:  Head: Normocephalic and atraumatic.  Right Ear: External ear normal.  Left Ear: External ear normal.  Nose: Nose normal.  Mouth/Throat: Oropharynx is clear and moist. No oropharyngeal exudate.  Eyes: Conjunctivae and EOM are normal. Pupils are equal, round, and reactive to light. Right eye exhibits no discharge. Left eye exhibits no discharge. No scleral icterus.  Neck: Normal range of motion. Neck supple. No  JVD present. No tracheal deviation present. No thyromegaly present.  Cardiovascular: Normal rate, regular rhythm, normal heart sounds and intact distal pulses.  Exam reveals no gallop and no friction rub.   No murmur heard. Pulmonary/Chest: Effort normal and breath sounds normal. No stridor. No respiratory distress. He has no wheezes. He has no rales. He exhibits no tenderness.  Abdominal: Soft. Bowel sounds are normal. He exhibits no distension and no mass. There is no tenderness. There is no rebound and no guarding.  Genitourinary:  Patient deferred  Musculoskeletal: Normal range of motion. He exhibits no edema or tenderness.  Lymphadenopathy:    He has no cervical adenopathy.  Neurological: He is alert and oriented to person, place, and time. He has normal reflexes. No cranial nerve deficit. He exhibits normal muscle tone. Gait normal. Coordination normal. GCS score is 15.  Skin: Skin is warm and dry. No rash noted. He is not diaphoretic. No erythema. No pallor.  Psychiatric: Mood, memory, affect and judgment normal.     LAB RESULTS:  CBC Latest Ref Rng 07/24/2015 07/24/2015  WBC 3.8 - 10.6 K/uL 7.0 -  Hemoglobin 13.0 - 18.0 g/dL 13.6 -  Hematocrit 37.5 - 51.0 % 41.4 41.7  Platelets 150 - 440 K/uL 331 -    No visits with results within 5 Day(s) from this visit. Latest known visit with results is:  Clinical Support on 07/24/2015  Component Date Value Ref Range Status  . Kappa free light chain 07/24/2015 56.65* 3.30 - 19.40 mg/L Final  . Lamda free light chains 07/24/2015 10.75  5.71 - 26.30 mg/L Final  . Kappa, lamda light chain ratio 07/24/2015 5.27* 0.26 - 1.65 Final   Comment: (NOTE) Performed At: [facility] [place], [place] [id number] Lindon Romp MD [id number]   . WBC 07/24/2015 7.0  3.8 - 10.6 K/uL Final  . RBC 07/24/2015 4.48  4.40 - 5.90 MIL/uL Final  . Hemoglobin 07/24/2015 13.6  13.0 - 18.0 g/dL Final  . HCT 07/24/2015 41.4  40.0 -  52.0 % Final  . MCV 07/24/2015 92.5  80.0 - 100.0 fL Final  . MCH 07/24/2015 30.5  26.0 - 34.0 pg Final  . MCHC 07/24/2015 32.9  32.0 - 36.0 g/dL Final  . RDW 07/24/2015 13.4  11.5 - 14.5 % Final  . Platelets 07/24/2015 331  150 - 440 K/uL Final  . Neutrophils Relative % 07/24/2015 68   Final  . Neutro Abs 07/24/2015 4.8  1.4 - 6.5 K/uL Final  . Lymphocytes Relative 07/24/2015 20   Final  . Lymphs Abs 07/24/2015 1.4  1.0 - 3.6 K/uL Final  . Monocytes Relative 07/24/2015 8   Final  . Monocytes Absolute 07/24/2015 0.6  0.2 - 1.0 K/uL Final  . Eosinophils Relative 07/24/2015 3   Final  . Eosinophils Absolute 07/24/2015 0.2  0 - 0.7 K/uL Final  . Basophils Relative 07/24/2015 1   Final  . Basophils Absolute 07/24/2015 0.1  0 - 0.1 K/uL Final  .  Total Protein ELP 07/24/2015 8.1  6.0 - 8.5 g/dL Final  . Albumin ELP 07/24/2015 3.4  2.9 - 4.4 g/dL Final  . Alpha-1-Globulin 07/24/2015 0.3  0.0 - 0.4 g/dL Final  . Alpha-2-Globulin 07/24/2015 0.7  0.4 - 1.0 g/dL Final  . Beta Globulin 07/24/2015 0.8  0.7 - 1.3 g/dL Final  . Gamma Globulin 07/24/2015 2.9* 0.4 - 1.8 g/dL Final  . M-Spike, % 07/24/2015 2.4* Not Observed g/dL Final  . SPE Interp. 07/24/2015 Comment   Final   Comment: (NOTE) The SPE pattern demonstrates a single peak (M-spike) in the gamma region which may represent monoclonal protein. This peak may also be caused by circulating immune complexes, cryoglobulins, C-reactive protein, fibrinogen or hemolysis.  If clinically indicated, the presence of a monoclonal gammopathy may be confirmed by immuno- fixation, as well as an evaluation of the urine for the presence of Bence-Jones protein. Performed At: [facility] [place], [place] [id number] Lindon Romp MD VO:[id number]   . Comment 07/24/2015 Comment   Final   Comment: (NOTE) Protein electrophoresis scan will follow via computer, mail, or courier delivery.   Marland Kitchen GLOBULIN, TOTAL 07/24/2015 4.7* 2.2 -  3.9 g/dL Corrected  . A/G Ratio 07/24/2015 0.7  0.7 - 1.7 Corrected  . Vitamin B-12 07/24/2015 154* 180 - 914 pg/mL Final   Comment: (NOTE) This assay is not validated for testing neonatal or myeloproliferative syndrome specimens for Vitamin B12 levels. Performed at [facility]   . Folate, Hemolysate 07/24/2015 408.2  Not Estab. ng/mL Final  . Hematocrit 07/24/2015 41.7  37.5 - 51.0 % Final  . Folate, RBC 07/24/2015 979  >498 ng/mL Final   Comment: (NOTE) Performed At: [facility] [place], [place] [id number] Lindon Romp MD [id number]   . Total Protein, Urine 07/24/2015 9.5  Not Estab. mg/dL Final  . Albumin, U 07/24/2015 9.6   Final  . ALPHA 1 URINE 07/24/2015 6.2   Final  . Alpha 2, Urine 07/24/2015 11.4   Final  . % Beta 07/24/2015 30.3   Final  . GAMMA GLOBULIN URINE 07/24/2015 42.5   Final  . M-Spike, % 07/24/2015 30.8* Not Observed % Final  . Immunofixation Result, Urine 07/24/2015 Comment   Final   Bence Jones Protein positive; kappa type.  . Note: 07/24/2015 Comment   Final   Comment: (NOTE) Protein electrophoresis scan will follow via computer, mail, or courier delivery. Performed At: [facility] [place], [place] [id number] Lindon Romp MD [id number]    Care Everywhere Result Report  IFE and PE, Serum - [facility]11/11/2014  [facility]  Component Name Value Range  Total IgG - [facility] 598 (L) 700 - 1600 mg/dL  Immunoglobulin A, Qn, Serum - [facility] 85 61 - 437 mg/dL  IgM - [facility] 2521 (H) Comment: Results confirmed on dilution. 20 - 172 mg/dL  Protein Total - [facility] 7.1 6.0 - 8.5 g/dL  Albumin - [facility] 3.5 2.9 - 4.4 g/dL  Alpha-1-Globulin - [facility] 0.2 0.0 - 0.4 g/dL  Alpha-2-Globulin - [facility] 0.5 0.4 - 1.0 g/dL  Beta Globulin - [facility] 0.7 0.7 - 1.3 g/dL  Gamma Globulin - [facility] 2.2 (H) 0.4 - 1.8 g/dL  M-Spike - [facility] 1.7 (H) Not Observed g/dL  Globulin, Total -  [facility] 3.6 2.2 - 3.9 g/dL  A/G Ratio - [facility] 1 0.7 - 1.7   IFE 1 - [facility] Comment Comment: Immunofixation shows IgM monoclonal protein with kappa light  chain specificity.   Please note: - [facility] Comment Comment: Protein electrophoresis scan will follow via computer, mail, or courier delivery.    CBC from 07/07/2015: WBC 6.8, RBC 4.5, hemoglobin 13.7, hematocrit 42.7, MCV 95.4, platelet count 320, absolute neutrophil count 4.26, absolute lymphocyte count 1.67, absolute monocyte count 0.66. CMP from 07/07/2015: Glucose 114, sodium 137, potassium 4.4, chloride 99, bicarbonate 30.1, BUN 10, creatinine 0.8, calcium 9.3, AST 13, ALT 9, alkaline phosphatase 45, albumin 3.9, total bilirubin 0.5, total protein 8.0, Quantitative immunoglobulins from 07/07/2015: IgG 598 (below normal), IgA 85 (within normal limits), IgM 2521 (above upper limits of normal)  Urine protein electrophoresis from 07/09/2015: M spike 32% percent TSH 1.1 (within normal) Sedimentation rate 91 (above upper limits of normal)  STUDIES: Ct Chest W Contrast  08/12/2015  CLINICAL DATA:  IGG abnormality, possible monoclonal gammopathy EXAM: CT CHEST, ABDOMEN, AND PELVIS WITH CONTRAST TECHNIQUE: Multidetector CT imaging of the chest, abdomen and pelvis was performed following the standard protocol during bolus administration of intravenous contrast. CONTRAST:  165m OMNIPAQUE IOHEXOL 300 MG/ML  SOLN COMPARISON:  None. FINDINGS: CT CHEST FINDINGS On lung window images, there is no evidence of lung infiltrate. No pleural effusion is seen. No suspicious lung nodule is noted. The central airway is patent. On bone window images, the thoracic vertebrae are in normal alignment. No compression deformity is seen. No radiolucent lesions are evident. On soft tissue window images, the thyroid gland is unremarkable. The thoracic aorta opacifies. The mid ascending thoracic aorta measures 4.2 cm in diameter. Recommend semi-annual imaging followup  by CTA or MRA and referral to cardiothoracic surgery if not already obtained. This recommendation follows 2010 ACCF/AHA/AATS/ACR/ASA/SCA/SCAI/SIR/STS/SVM Guidelines for the Diagnosis and Management of Patients With Thoracic Aortic Disease. Circulation. 2010; 121: e266-e36 the pulmonary arteries are not as well opacified but no central abnormality is evident. There is calcification of the left anterior descending coronary artery. No pericardial effusion is seen. No mediastinal or hilar adenopathy is noted. CT ABDOMEN PELVIS FINDINGS The liver enhances and multiple low-attenuation lesions are present scattered throughout the entire liver consistent with multiple hepatic cysts. No definite solid hepatic lesion is seen. No ductal dilatation is noted. No calcified gallstones are seen. The pancreas is normal in size in the pancreatic duct is not dilated. The adrenal glands and spleen are unremarkable. The stomach is moderately fluid distended without definite abnormality. The kidneys enhance with no calculus or mass. There is a cyst emanating from the posterior right kidney of 3.0 cm. On delayed images, the pelvocaliceal systems are unremarkable and the proximal ureters are normal in caliber. The distal ureters are normal in caliber. There is a faint calcification in the right urinary bladder which could represent a recently passed calculus. Clinical correlation is recommended. There is also significant enlargement of the prostate noted with the prostate measuring 5.6 x 5.7 cm. The urinary bladder is not distended well but it is somewhat thick-walled which may indicate a degree of bladder outlet obstruction. Fat enters both inguinal canals. There are multiple rectosigmoid colon diverticula present. There is a moderate amount of feces throughout the entire colon. The terminal ileum is unremarkable as is the appendix. The lumbar vertebrae are in normal alignment with mild degenerative change. No compression deformity is  seen. No radiolucent bone lesion is evident by CT. IMPRESSION: 1. No radiolucent bone lesions are seen by CT. 2. No mass is seen in the chest and no mediastinal -hilar adenopathy is noted. 3. The ascending aorta measures 4.2 cm in  maximum diameter. Recommend semi-annual imaging followup by CTA or MRA and referral to cardiothoracic surgery if not already obtained. This recommendation follows 2010 ACCF/AHA/AATS/ACR/ASA/SCA/SCAI/SIR/STS/SVM Guidelines for the Diagnosis and Management of Patients With Thoracic Aortic Disease. Circulation. 2010; 121: e266-e36 4. Calcification of the left anterior descending coronary artery. 5. Multiple hepatic cysts. 6. A calcification appears to be within the posterior urinary bladder which may represent a recently passed calculus. Correlate clinically. 7. Very prominent prostate with somewhat thick-walled urinary bladder which is not distended. Consider a degree of bladder outlet obstruction. 8. Multiple rectosigmoid colon diverticula. Electronically Signed   By: Ivar Drape M.D.   On: 08/12/2015 11:42   Mr Jodene Nam Head Wo Contrast  07/21/2015  CLINICAL DATA:  Altered mental status.  Confusion. EXAM: MRA HEAD WITHOUT CONTRAST TECHNIQUE: Angiographic images of the Circle of Willis were obtained using MRA technique without intravenous contrast. COMPARISON:  CT head 07/01/2015 FINDINGS: Teddy Spike is tortuous. This degrades image quality and causes decreased flow related signal in the tortuous vessels especially involving the middle cerebral artery branches and left posterior cerebral artery. There is also decreased signal in the distal right vertebral artery which may be due to tortuosity. Allowing for these limitations, no definite intracranial atherosclerotic disease or aneurysm. IMPRESSION: Image quality is limited by tortuosity causing decreased signal in the circle Willis. Allowing for this, no large vessel occlusion or flow limiting stenosis is identified. If the patient has  cerebral vascular symptoms, CTA may be a better test to evaluate the circle Willis. Electronically Signed   By: Franchot Gallo M.D.   On: 07/21/2015 09:59   Ct Abdomen Pelvis W Contrast  08/12/2015  CLINICAL DATA:  IGG abnormality, possible monoclonal gammopathy EXAM: CT CHEST, ABDOMEN, AND PELVIS WITH CONTRAST TECHNIQUE: Multidetector CT imaging of the chest, abdomen and pelvis was performed following the standard protocol during bolus administration of intravenous contrast. CONTRAST:  144m OMNIPAQUE IOHEXOL 300 MG/ML  SOLN COMPARISON:  None. FINDINGS: CT CHEST FINDINGS On lung window images, there is no evidence of lung infiltrate. No pleural effusion is seen. No suspicious lung nodule is noted. The central airway is patent. On bone window images, the thoracic vertebrae are in normal alignment. No compression deformity is seen. No radiolucent lesions are evident. On soft tissue window images, the thyroid gland is unremarkable. The thoracic aorta opacifies. The mid ascending thoracic aorta measures 4.2 cm in diameter. Recommend semi-annual imaging followup by CTA or MRA and referral to cardiothoracic surgery if not already obtained. This recommendation follows 2010 ACCF/AHA/AATS/ACR/ASA/SCA/SCAI/SIR/STS/SVM Guidelines for the Diagnosis and Management of Patients With Thoracic Aortic Disease. Circulation. 2010; 121: [id number] the pulmonary arteries are not as well opacified but no central abnormality is evident. There is calcification of the left anterior descending coronary artery. No pericardial effusion is seen. No mediastinal or hilar adenopathy is noted. CT ABDOMEN PELVIS FINDINGS The liver enhances and multiple low-attenuation lesions are present scattered throughout the entire liver consistent with multiple hepatic cysts. No definite solid hepatic lesion is seen. No ductal dilatation is noted. No calcified gallstones are seen. The pancreas is normal in size in the pancreatic duct is not dilated. The  adrenal glands and spleen are unremarkable. The stomach is moderately fluid distended without definite abnormality. The kidneys enhance with no calculus or mass. There is a cyst emanating from the posterior right kidney of 3.0 cm. On delayed images, the pelvocaliceal systems are unremarkable and the proximal ureters are normal in caliber. The distal ureters are normal in caliber. There  is a faint calcification in the right urinary bladder which could represent a recently passed calculus. Clinical correlation is recommended. There is also significant enlargement of the prostate noted with the prostate measuring 5.6 x 5.7 cm. The urinary bladder is not distended well but it is somewhat thick-walled which may indicate a degree of bladder outlet obstruction. Fat enters both inguinal canals. There are multiple rectosigmoid colon diverticula present. There is a moderate amount of feces throughout the entire colon. The terminal ileum is unremarkable as is the appendix. The lumbar vertebrae are in normal alignment with mild degenerative change. No compression deformity is seen. No radiolucent bone lesion is evident by CT. IMPRESSION: 1. No radiolucent bone lesions are seen by CT. 2. No mass is seen in the chest and no mediastinal -hilar adenopathy is noted. 3. The ascending aorta measures 4.2 cm in maximum diameter. Recommend semi-annual imaging followup by CTA or MRA and referral to cardiothoracic surgery if not already obtained. This recommendation follows 2010 ACCF/AHA/AATS/ACR/ASA/SCA/SCAI/SIR/STS/SVM Guidelines for the Diagnosis and Management of Patients With Thoracic Aortic Disease. Circulation. 2010; 121: [id number] 4. Calcification of the left anterior descending coronary artery. 5. Multiple hepatic cysts. 6. A calcification appears to be within the posterior urinary bladder which may represent a recently passed calculus. Correlate clinically. 7. Very prominent prostate with somewhat thick-walled urinary bladder  which is not distended. Consider a degree of bladder outlet obstruction. 8. Multiple rectosigmoid colon diverticula. Electronically Signed   By: Ivar Drape M.D.   On: 08/12/2015 11:42   I personally reviewed the available images.  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 Etienne and his family members regarding the significance of this findings. We underlined the fact that this is a condition which has a very low risk of transformation to either Waldenstrom disease or multiple myeloma, likely around 1% per year, and in general it requires only once every 6 months bloodwork, and annual follow-up visits. We will institute this monitoring regimen.   Anemia- patient has a confirmed vitamin B12 deficiency anemia. We will continue with supplementation. He has completed a weekly portion of treatment, and from now on will be receiving a monthly injections.  Enlarged prostate-patient was previously seen by a urologist specialists in [facility] for [place]. We will refer the patient to our urology colleagues for additional follow-up.  Confusion-the exact etiology is unclear. During regular interview Mr. Diekman appears to be logical, appropriate without bizarre ideation, with normal thought process. He may need to be evaluated by a memory specialist or psychiatrist if symptoms continue to worsen.   Ascending thoracic aorta distention-will request CT of the chest with contrast in 6 months.  Patient expressed understanding and was in agreement with this plan. He also understands that He can call clinic at any time with any questions, concerns, or complaints.    No matching staging information was found for the patient.  Roxana Hires, MD   08/14/2015 3:04 PM

## 2015-08-18 ENCOUNTER — Other Ambulatory Visit: Payer: Self-pay | Admitting: Family Medicine

## 2015-08-18 ENCOUNTER — Encounter: Payer: Self-pay | Admitting: Family Medicine

## 2015-08-18 ENCOUNTER — Encounter: Payer: Self-pay | Admitting: *Deleted

## 2015-08-18 DIAGNOSIS — I7789 Other specified disorders of arteries and arterioles: Secondary | ICD-10-CM

## 2015-08-18 HISTORY — DX: Other specified disorders of arteries and arterioles: I77.89

## 2015-08-19 ENCOUNTER — Telehealth: Payer: Self-pay

## 2015-08-19 NOTE — Telephone Encounter (Signed)
Contacted patient regarding his urology appointment with [facility]. Informed him the appointment was scheduled with Carlyle Lipa at 0900 on December 20th. Patient stated understanding and repeated appointment time. He asked about the approximate cost of the appointment and I gave him the telephone number for [facility] so he could contact them. Patient thanked me for calling.

## 2015-08-25 ENCOUNTER — Telehealth: Payer: Self-pay | Admitting: *Deleted

## 2015-08-25 ENCOUNTER — Ambulatory Visit (INDEPENDENT_AMBULATORY_CARE_PROVIDER_SITE_OTHER): Payer: 59 | Admitting: Obstetrics and Gynecology

## 2015-08-25 ENCOUNTER — Encounter: Payer: Self-pay | Admitting: Obstetrics and Gynecology

## 2015-08-25 VITALS — BP 161/105 | HR 78 | Resp 16 | Ht 71.0 in | Wt 201.1 lb

## 2015-08-25 DIAGNOSIS — R350 Frequency of micturition: Secondary | ICD-10-CM | POA: Diagnosis not present

## 2015-08-25 LAB — MICROSCOPIC EXAMINATION
BACTERIA UA: NONE SEEN
Epithelial Cells (non renal): NONE SEEN /hpf (ref 0–10)
WBC, UA: NONE SEEN /hpf (ref 0–?)

## 2015-08-25 LAB — URINALYSIS, COMPLETE
Bilirubin, UA: NEGATIVE
KETONES UA: NEGATIVE
LEUKOCYTES UA: NEGATIVE
NITRITE UA: NEGATIVE
PH UA: 7 (ref 5.0–7.5)
Protein, UA: NEGATIVE
Specific Gravity, UA: 1.015 (ref 1.005–1.030)
UUROB: 0.2 mg/dL (ref 0.2–1.0)

## 2015-08-25 LAB — BLADDER SCAN AMB NON-IMAGING: Scan Result: 56

## 2015-08-25 NOTE — Telephone Encounter (Signed)
I contacted the pt to remind him of a PSA lab that he missed during his appointment.  He stated that he would be here tomorrow for the lab.  He also left a pair of gloves that he said he would get then.

## 2015-08-25 NOTE — Progress Notes (Signed)
08/25/2015 12:36 PM   Adrian Valdez [birth year] [id number]  Referring provider: Kirk Ruths, MD [facility] [place],  [address]  Chief Complaint  Patient presents with  . Urinary Frequency  . Establish Care    HPI: Patient is a 64 year old male with a history of BPH presenting today as a referral from his primary care provider to urologic care. He recently moved from [place]. Patient states that he was seen by urologist in [place] and was very displeased with his care. He states that he was placed on medications such as oxybutynin and Myrbetriq. He reports that he experienced "horrible" side effects from oxybutynin including hallucinations which ultimately did open and being hospitalized. With Myrbetriq he experienced very severe headaches.  Baseline urinary symptoms include nocturia. 4-5 times per night. Patient reports that this is fluid dependent. He does drink large amounts of coffee throughout the day. He does not restrict his fluid intake prior to bed. Her urinary symptoms include some urgency later in the day as well as a history of dysuria which patient reports has resolved over the last 2-3 weeks.  Patient was seen by his primary care provider on 08/06/15 with similar complaints and was prescribed Flomax. He reports that his urinary symptoms are not very bothersome at this time so it appears that the Flomax is helpful. He denies any adverse side effects from medication.  Today PVR 60mL.   After reviewing patient's previous visits with his primary care provider it is of note that patient is currently undergoing a thorough workup for increasing confusion over the last year. 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 CT head 07/01/15 noting no acute intracranial abnormality. Neurology consult pending.   PMH: Past Medical History  Diagnosis Date  . Glaucoma   . Enlarged prostate   . Vitamin B 12 deficiency   . Hypertension   . Urinary  frequency   . B12 deficiency 07/27/2015  . 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 Glaucoma     Surgical History: Past Surgical History  Procedure Laterality Date  . Knee arthroscopy Right     Home Medications:    Medication List       This list is accurate as of: 08/25/15 12:36 PM.  Always use your most recent med list.               finasteride 5 MG tablet  Commonly known as:  PROSCAR  Take 5 mg by mouth daily.     lisinopril 10 MG tablet  Commonly known as:  PRINIVIL,ZESTRIL  Take by mouth.     methazolamide 50 MG tablet  Commonly known as:  NEPTAZANE  TK 1 T PO BID     tamsulosin 0.4 MG Caps capsule  Commonly known as:  FLOMAX  Take by mouth. Reported on 08/25/2015     timolol 0.5 % ophthalmic solution  Commonly known as:  BETIMOL  Apply to eye.        Allergies:  Allergies  Allergen Reactions  . Oxybutynin Other (See Comments)    Headache  . Myrbetriq [Mirabegron] Other (See Comments)    migraine     Family History: Family History  Problem Relation Age of Onset  . Hypertension Mother   . Hypertension Father   . CAD Father   . Breast cancer Maternal Grandmother   . Ovarian cancer Paternal Grandmother     Social History:  reports that he has never smoked. He has  never used smokeless tobacco. He reports that he drinks alcohol. He reports that he does not use illicit drugs.  ROS: UROLOGY Frequent Urination?: Yes Hard to postpone urination?: Yes Burning/pain with urination?: Yes Get up at night to urinate?: Yes Leakage of urine?: Yes Urine stream starts and stops?: Yes Trouble starting stream?: No Do you have to strain to urinate?: No Blood in urine?: No Urinary tract infection?: No Sexually transmitted disease?: No Injury to kidneys or bladder?: No Painful intercourse?: No Weak stream?: No Erection problems?: No Penile pain?: No  Gastrointestinal Nausea?: No Vomiting?: No Indigestion/heartburn?: No Diarrhea?:  No Constipation?: No  Constitutional Fever: No Night sweats?: No Weight loss?: No Fatigue?: No  Skin Skin rash/lesions?: No Itching?: No  Eyes Blurred vision?: No Double vision?: No  Ears/Nose/Throat Sore throat?: No Sinus problems?: No  Hematologic/Lymphatic Swollen glands?: No Easy bruising?: No  Cardiovascular Leg swelling?: No Chest pain?: No  Respiratory Cough?: No Shortness of breath?: No  Endocrine Excessive thirst?: No  Musculoskeletal Back pain?: No Joint pain?: No  Neurological Headaches?: No Dizziness?: No  Psychologic Depression?: No Anxiety?: No  Physical Exam: BP 161/105 mmHg  Pulse 78  Resp 16  Ht 5\' 11"  (1.803 m)  Wt 201 lb 1.6 oz (91.218 kg)  BMI 28.06 kg/m2  Constitutional:  Alert and oriented, No acute distress. HEENT: [place] AT, moist mucus membranes.  Trachea midline, no masses. Cardiovascular: No clubbing, cyanosis, or edema. Respiratory: Normal respiratory effort, no increased work of breathing. GI: Abdomen is soft, nontender, nondistended, no abdominal masses GU: No CVA tenderness. Normal uncircumcised phallus with easily retractable foreskin, patent urinary meatus,  testicles descended bilaterally without masses or tenderness DRE: +3 prostate, smooth, nontender, no nodularity (chaperone: Richardson Landry CMA) Skin: No rashes, bruises or suspicious lesions. Lymph: No cervical or inguinal adenopathy. Neurologic: Grossly intact, no focal deficits, moving all 4 extremities. Psychiatric: Normal mood and affect.  Laboratory Data:   Urinalysis Results for orders placed or performed in visit on 08/25/15  BLADDER SCAN AMB NON-IMAGING  Result Value Ref Range   Scan Result 56 mL      Pertinent Imaging:   Assessment & Plan:    1. BPH with LUTS-  UA unremarkable. Prostate significantly enlarged on exam. Patient is currently taking daily Flomax for his urinary symptoms. He reports his symptoms are mild at this time. He has experienced  significant adverse reactions with medications in the past (oxybutin and Myrbetriq). I encouraged him to continue his daily Flomax because it seems that he is tolerating the medication well. I do not feel any additional medications need  to be initiated at this time. He is emptying his bladder bladder well. His PVR was minimal today. We will check his PSA.   I encouraged him to decrease his caffeine intake and limiting his fluids 4 hours prior to going to sleep. - Urinalysis, Complete - BLADDER SCAN AMB NON-IMAGING   Return in about 3 months (around 11/23/2015).  These notes generated with voice recognition software. I apologize for typographical errors.  Herbert Moors, [facility] [address], [place], [place] [address] [phone]

## 2015-08-26 LAB — PSA: Prostate Specific Ag, Serum: 3.6 ng/mL (ref 0.0–4.0)

## 2015-09-01 ENCOUNTER — Telehealth: Payer: Self-pay

## 2015-09-01 NOTE — Telephone Encounter (Signed)
Left message with his mother to have pt give a call back.

## 2015-09-01 NOTE — Telephone Encounter (Signed)
-----   Message from Roda Shutters, [place] sent at 08/27/2015  4:50 PM EST ----- Please notify patient that his PSA level was 3.6. This is within the normal range for his age. He should have this checked yearly from now on. I will see him at his follow-up appointment in 3 months. Thanks

## 2015-09-02 NOTE — Telephone Encounter (Signed)
Spoke with pt in reference to PSA results. Pt voiced understanding.  

## 2015-09-17 NOTE — Telephone Encounter (Signed)
Opened in error

## 2015-11-26 ENCOUNTER — Ambulatory Visit: Payer: 59 | Admitting: Urology

## 2015-11-30 ENCOUNTER — Encounter: Payer: Self-pay | Admitting: Urology

## 2015-11-30 ENCOUNTER — Ambulatory Visit (INDEPENDENT_AMBULATORY_CARE_PROVIDER_SITE_OTHER): Payer: 59 | Admitting: Urology

## 2015-11-30 VITALS — BP 160/103 | HR 66 | Ht 71.0 in | Wt 201.1 lb

## 2015-11-30 DIAGNOSIS — R300 Dysuria: Secondary | ICD-10-CM | POA: Diagnosis not present

## 2015-11-30 DIAGNOSIS — R351 Nocturia: Secondary | ICD-10-CM

## 2015-11-30 DIAGNOSIS — R3915 Urgency of urination: Secondary | ICD-10-CM | POA: Diagnosis not present

## 2015-11-30 LAB — URINALYSIS, COMPLETE
Bilirubin, UA: NEGATIVE
Ketones, UA: NEGATIVE
Leukocytes, UA: NEGATIVE
Nitrite, UA: NEGATIVE
Protein, UA: NEGATIVE
Specific Gravity, UA: 1.02 (ref 1.005–1.030)
Urobilinogen, Ur: 0.2 mg/dL (ref 0.2–1.0)
pH, UA: 7 (ref 5.0–7.5)

## 2015-11-30 LAB — MICROSCOPIC EXAMINATION
BACTERIA UA: NONE SEEN
Epithelial Cells (non renal): NONE SEEN /hpf (ref 0–10)

## 2015-11-30 NOTE — Progress Notes (Signed)
10:25 AM   Adrian Valdez Jan 22, 1951 [id number]  Referring provider: Kirk Ruths, MD [facility] [place],  [address]  Chief Complaint  Patient presents with  . Urinary Frequency    3 month follow up    HPI: Patient is a 65 year old Caucasian male who presents today for a 3 month follow up for his BPH with LUTS.  He is not taking the tamsulosin as it felt it was too similar to the oxybutynin and Myrbetriq medication.  He is only on the finasteride.  Background history Patient is a referral from his primary care provider to urologic care. He recently moved from [place]. Patient states that he was seen by urologist in [place] and was very displeased with his care. He states that he was placed on medications such as oxybutynin and Myrbetriq. He reports that he experienced "horrible" side effects from oxybutynin including hallucinations which ultimately did open and being hospitalized. With Myrbetriq he experienced very severe headaches.  His has decreased his coffee consumption to four cups a day and limited his fluid intake, but he has seen minimal benefit in his urinary symptoms.  Baseline urinary symptoms include nocturia. 4-5 times per night. His urinary symptoms include some urgency later in the day as well as a history of dysuria.   His previous PVR was 47mL.       IPSS      11/30/15 0900       International Prostate Symptom Score   How often have you had the sensation of not emptying your bladder? More than half the time     How often have you had to urinate less than every two hours? About half the time     How often have you found you stopped and started again several times when you urinated? Almost always     How often have you found it difficult to postpone urination? Almost always     How often have you had a weak urinary stream? Almost always     How often have you had to strain to start urination? Not at All     How many times  did you typically get up at night to urinate? 3 Times     Total IPSS Score 25     Quality of Life due to urinary symptoms   If you were to spend the rest of your life with your urinary condition just the way it is now how would you feel about that? Mixed        Score:  1-7 Mild 8-19 Moderate 20-35 Severe  After reviewing patient's previous visits with his primary care provider it is of note that patient is currently undergoing a thorough workup for increasing confusion over the last year.   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 CT head 07/01/15 noting no acute intracranial abnormality.  He has a hx of major depressive disorder and B12 deficiency.     PMH: Past Medical History  Diagnosis Date  . Glaucoma   . Enlarged prostate   . Vitamin B 12 deficiency   . Hypertension   . Urinary frequency   . B12 deficiency 07/27/2015  . 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 Glaucoma     Surgical History: Past Surgical History  Procedure Laterality Date  . Knee arthroscopy Right     Home Medications:    Medication List       This list is accurate  as of: 11/30/15 10:25 AM.  Always use your most recent med list.               finasteride 5 MG tablet  Commonly known as:  PROSCAR  Take 5 mg by mouth daily. Reported on 11/30/2015     lisinopril 10 MG tablet  Commonly known as:  PRINIVIL,ZESTRIL  Take by mouth. Reported on 11/30/2015     methazolamide 50 MG tablet  Commonly known as:  NEPTAZANE  TK 1 T PO BID     tamsulosin 0.4 MG Caps capsule  Commonly known as:  FLOMAX  Take by mouth. Reported on 11/30/2015     timolol 0.5 % ophthalmic solution  Commonly known as:  BETIMOL  Apply to eye. Reported on 11/30/2015     Travoprost (BAK Free) 0.004 % Soln ophthalmic solution  Commonly known as:  TRAVATAN  1 drop at bedtime.        Allergies:  Allergies  Allergen Reactions  . Oxybutynin Other (See Comments)    Headache  . Myrbetriq [Mirabegron] Other  (See Comments)    migraine     Family History: Family History  Problem Relation Age of Onset  . Hypertension Mother   . Hypertension Father   . CAD Father   . Breast cancer Maternal Grandmother   . Ovarian cancer Paternal Grandmother   . Kidney disease Neg Hx   . Prostate cancer Neg Hx     Social History:  reports that he has never smoked. He has never used smokeless tobacco. He reports that he drinks alcohol. He reports that he does not use illicit drugs.  ROS: UROLOGY Frequent Urination?: Yes Hard to postpone urination?: Yes Burning/pain with urination?: Yes Get up at night to urinate?: Yes Leakage of urine?: No Urine stream starts and stops?: No Trouble starting stream?: No Do you have to strain to urinate?: No Blood in urine?: No Urinary tract infection?: No Sexually transmitted disease?: No Injury to kidneys or bladder?: No Painful intercourse?: No Weak stream?: No Erection problems?: No Penile pain?: No  Gastrointestinal Nausea?: No Vomiting?: No Indigestion/heartburn?: No Diarrhea?: No Constipation?: No  Constitutional Fever: No Night sweats?: No Weight loss?: No Fatigue?: No  Skin Skin rash/lesions?: No Itching?: No  Eyes Blurred vision?: No Double vision?: No  Ears/Nose/Throat Sore throat?: No Sinus problems?: No  Hematologic/Lymphatic Swollen glands?: No Easy bruising?: No  Cardiovascular Leg swelling?: No Chest pain?: No  Respiratory Cough?: No Shortness of breath?: No  Endocrine Excessive thirst?: No  Musculoskeletal Back pain?: No Joint pain?: No  Neurological Headaches?: No Dizziness?: No  Psychologic Depression?: No Anxiety?: No  Physical Exam: BP 160/103 mmHg  Pulse 66  Ht 5\' 11"  (1.803 m)  Wt 201 lb 1.6 oz (91.218 kg)  BMI 28.06 kg/m2  Constitutional:  Alert and oriented, No acute distress. HEENT: [place] AT, moist mucus membranes.  Trachea midline, no masses. Cardiovascular: No clubbing, cyanosis, or  edema. Respiratory: Normal respiratory effort, no increased work of breathing. GI: Abdomen is soft, nontender, nondistended, no abdominal masses Skin: No rashes, bruises or suspicious lesions. Lymph: No cervical or inguinal adenopathy. Neurologic: Grossly intact, no focal deficits, moving all 4 extremities. Psychiatric: Normal mood and affect.  Laboratory Data:  Urinalysis Results for orders placed or performed in visit on 11/30/15  Microscopic Examination  Result Value Ref Range   WBC, UA 0-5 0 -  5 /hpf   RBC, UA 0-2 0 -  2 /hpf   Epithelial Cells (non renal) None  seen 0 - 10 /hpf   Mucus, UA Present (A) Not Estab.   Bacteria, UA None seen None seen/Few  Urinalysis, Complete  Result Value Ref Range   Specific Gravity, UA 1.020 1.005 - 1.030   pH, UA 7.0 5.0 - 7.5   Color, UA Yellow Yellow   Appearance Ur Clear Clear   Leukocytes, UA Negative Negative   Protein, UA Negative Negative/Trace   Glucose, UA Trace (A) Negative   Ketones, UA Negative Negative   RBC, UA Trace (A) Negative   Bilirubin, UA Negative Negative   Urobilinogen, Ur 0.2 0.2 - 1.0 mg/dL   Nitrite, UA Negative Negative   Microscopic Examination See below:      Assessment & Plan:    1. BPH with LUTS:   IPSS score is 25/3.  He will continue the finasteride 5 mg daily.  His main complaint is urinary urgency.  I did emphasized that the caffeine is irritating to the bladder and he should cut it out completely.  We did discuss PTNS therapy as he is opposed to trying any medication, but he is not interested in that treatment modality.  - Urinalysis, Complete  2. Dysuria:   Patient continues to have irritative voiding symptoms.  His UA is unremarkable at this time.  I would like him to undergo a cystoscopy at this time to evaluate for CIS.    3. Nocturia:   I discussed with the patient to limit fluids before bed time and to reduce his caffeine consumption even further to see if this will reduce his nocturia.      Return for cystoscopy to rule out CIS.  These notes generated with voice recognition software. I apologize for typographical errors.  Zara Council, [facility] [address] [place], [place] [address] [phone]

## 2015-12-01 ENCOUNTER — Telehealth: Payer: Self-pay | Admitting: Urology

## 2015-12-01 NOTE — Telephone Encounter (Signed)
Okay 

## 2015-12-01 NOTE — Telephone Encounter (Signed)
Pt called to cancel his cysto and did not want to reschedule.  He said he's decided against the procedure. Just F.Y.I.

## 2015-12-01 NOTE — Telephone Encounter (Signed)
Okay.  He will need to continue his finasteride and follow up in 6 months.

## 2015-12-14 ENCOUNTER — Other Ambulatory Visit: Payer: 59

## 2016-01-26 NOTE — Telephone Encounter (Signed)
Left message for pt to call back to schedule 6 month follow up.

## 2016-02-12 ENCOUNTER — Inpatient Hospital Stay: Payer: 59 | Attending: Family Medicine

## 2016-07-19 ENCOUNTER — Ambulatory Visit (INDEPENDENT_AMBULATORY_CARE_PROVIDER_SITE_OTHER): Payer: BLUE CROSS/BLUE SHIELD | Admitting: Urology

## 2016-07-19 ENCOUNTER — Telehealth: Payer: Self-pay | Admitting: Urology

## 2016-07-19 VITALS — Ht 71.0 in | Wt 216.3 lb

## 2016-07-19 DIAGNOSIS — N281 Cyst of kidney, acquired: Secondary | ICD-10-CM | POA: Diagnosis not present

## 2016-07-19 DIAGNOSIS — R351 Nocturia: Secondary | ICD-10-CM

## 2016-07-19 DIAGNOSIS — R300 Dysuria: Secondary | ICD-10-CM

## 2016-07-19 DIAGNOSIS — Z125 Encounter for screening for malignant neoplasm of prostate: Secondary | ICD-10-CM | POA: Insufficient documentation

## 2016-07-19 DIAGNOSIS — R3915 Urgency of urination: Secondary | ICD-10-CM | POA: Diagnosis not present

## 2016-07-19 LAB — MICROSCOPIC EXAMINATION
Bacteria, UA: NONE SEEN
Epithelial Cells (non renal): NONE SEEN /hpf (ref 0–10)

## 2016-07-19 LAB — URINALYSIS, COMPLETE
BILIRUBIN UA: NEGATIVE
GLUCOSE, UA: NEGATIVE
KETONES UA: NEGATIVE
Leukocytes, UA: NEGATIVE
NITRITE UA: NEGATIVE
UUROB: 0.2 mg/dL (ref 0.2–1.0)
pH, UA: 5 (ref 5.0–7.5)

## 2016-07-19 NOTE — Telephone Encounter (Signed)
Patient did not want to schd the cysto today. He said that he would rather think about it and call us back if he decides to have it done. He did not want to make a follow up appt.   Sharyn Lull

## 2016-07-19 NOTE — Progress Notes (Signed)
07/19/2016 6:18 AM   Adrian Valdez 25-Aug-1951 [id number]  Referring provider: Kirk Ruths, MD [facility] [place], [place] [address]  No chief complaint on file.   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 Hospitalized with hallucination with trial of anticholinergic. Prostate vol 119mL by CT 2016 with medain lobe. PVR 2017 " 24 mL"  2 - Prostate Screening -  2016 - PSA 3.6 (variable finasteride compliance), Vol 168ml, PSAD 0.026 (very low)  3 - Non-Complex Right Renal Cyst - 3cm noncomplex right mid renal cyst on abd CT 2016. No coarse calcifications, enhancing nodules, or mass effect.  PMH sig for major depression, he is retired professor from [employer] now living in [place] as he has family here.  Today Mr. Montan is seen in f/u above.    PMH: Past Medical History:  Diagnosis Date  . 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 B12 deficiency 07/27/2015  . Enlarged prostate   . Glaucoma   . Glaucoma   . Hypertension   . Urinary frequency   . Vitamin B 12 deficiency     Surgical History: Past Surgical History:  Procedure Laterality Date  . KNEE ARTHROSCOPY Right     Home Medications:    Medication List       Accurate as of 07/19/16  6:18 AM. Always use your most recent med list.          finasteride 5 MG tablet Commonly known as:  PROSCAR Take 5 mg by mouth daily. Reported on 11/30/2015   lisinopril 10 MG tablet Commonly known as:  PRINIVIL,ZESTRIL Take by mouth. Reported on 11/30/2015   methazolamide 50 MG tablet Commonly known as:  NEPTAZANE TK 1 T PO BID   tamsulosin 0.4 MG Caps capsule Commonly known as:  FLOMAX Take by mouth. Reported on 11/30/2015   timolol 0.5 % ophthalmic solution Commonly known as:  BETIMOL Apply to eye. Reported on 11/30/2015   Travoprost (BAK Free) 0.004 % Soln  ophthalmic solution Commonly known as:  TRAVATAN 1 drop at bedtime.       Allergies:  Allergies  Allergen Reactions  . Oxybutynin Other (See Comments)    Headache  . Myrbetriq [Mirabegron] Other (See Comments)    migraine     Family History: Family History  Problem Relation Age of Onset  . Hypertension Mother   . Hypertension Father   . CAD Father   . Breast cancer Maternal Grandmother   . Ovarian cancer Paternal Grandmother   . Kidney disease Neg Hx   . Prostate cancer Neg Hx     Social History:  reports that he has never smoked. He has never used smokeless tobacco. He reports that he drinks alcohol. He reports that he does not use drugs.  ROS:     Review of Systems  Gastrointestinal (upper)  : Negative for upper GI symptoms  Gastrointestinal (lower) : Negative for lower GI symptoms  Constitutional : Negative for symptoms  Skin: Negative for skin symptoms  Eyes: Negative for eye symptoms  Ear/Nose/Throat : Negative for Ear/Nose/Throat symptoms  Hematologic/Lymphatic: Negative for Hematologic/Lymphatic symptoms  Cardiovascular : Negative for cardiovascular symptoms  Respiratory : Negative for respiratory symptoms  Endocrine: Negative for endocrine symptoms  Musculoskeletal: Negative for musculoskeletal symptoms  Neurological: Negative for neurological symptoms  Psychologic: Negative for psychiatric symptoms  Physical Exam: There were no vitals taken for this visit.  Constitutional:  Alert and oriented, No acute distress. Very blunted affect. HEENT: [place] AT, moist mucus membranes.  Trachea midline, no masses. Cardiovascular: No clubbing, cyanosis, or edema. Respiratory: Normal respiratory effort, no increased work of breathing. GI: Abdomen is soft, nontender, nondistended, no abdominal masses GU: No CVA tenderness. DRE >80gm smooth.  Skin: No rashes, bruises or suspicious lesions. Lymph: No cervical or inguinal  adenopathy. Neurologic: Grossly intact, no focal deficits, moving all 4 extremities. Psychiatric: Normal mood and affect.  Laboratory Data: Lab Results  Component Value Date   WBC 7.0 07/24/2015   HGB 13.6 07/24/2015   HCT 41.4 07/24/2015   HCT 41.7 07/24/2015   MCV 92.5 07/24/2015   PLT 331 07/24/2015    Lab Results  Component Value Date   CREATININE 1.13 07/01/2015    No results found for: PSA  No results found for: TESTOSTERONE  No results found for: HGBA1C  Urinalysis    Component Value Date/Time   APPEARANCEUR Clear 11/30/2015 1009   GLUCOSEU Trace (A) 11/30/2015 1009   BILIRUBINUR Negative 11/30/2015 1009   PROTEINUR Negative 11/30/2015 1009   NITRITE Negative 11/30/2015 1009   LEUKOCYTESUR Negative 11/30/2015 1009    Pertinent Imaging: as per HPI  Assessment & Plan:    1 - Very Large Prostate with Lower Urinary Tract Symptoms - symptoms impressive despite max medical therapy. 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 Rec cysto and simple prostatectomy as most definitive means of management as this will otherwise likely continue to progress.   2 - Prostate Screening - up to date..   3 - Non-Complex Right Renal Cyst - no further evaluation warranted, low risk.   At this point he wants to think about surgery. Fortunatley he empties well and this is not emergent whatsoever.  RTC 4 weeks for cysto and rediscuss.     No Follow-up on file.  Alexis Frock, [facility] [address] [place], [place] [address] [phone]

## 2016-08-08 ENCOUNTER — Inpatient Hospital Stay: Payer: BLUE CROSS/BLUE SHIELD | Attending: Hematology and Oncology

## 2016-08-08 ENCOUNTER — Other Ambulatory Visit: Payer: Self-pay

## 2016-08-08 DIAGNOSIS — I10 Essential (primary) hypertension: Secondary | ICD-10-CM | POA: Insufficient documentation

## 2016-08-08 DIAGNOSIS — Z79899 Other long term (current) drug therapy: Secondary | ICD-10-CM | POA: Insufficient documentation

## 2016-08-08 DIAGNOSIS — N401 Enlarged prostate with lower urinary tract symptoms: Secondary | ICD-10-CM | POA: Insufficient documentation

## 2016-08-08 DIAGNOSIS — Z8041 Family history of malignant neoplasm of ovary: Secondary | ICD-10-CM | POA: Insufficient documentation

## 2016-08-08 DIAGNOSIS — G319 Degenerative disease of nervous system, unspecified: Secondary | ICD-10-CM | POA: Diagnosis not present

## 2016-08-08 DIAGNOSIS — R803 Bence Jones proteinuria: Secondary | ICD-10-CM | POA: Insufficient documentation

## 2016-08-08 DIAGNOSIS — C8300 Small cell B-cell lymphoma, unspecified site: Secondary | ICD-10-CM | POA: Diagnosis not present

## 2016-08-08 DIAGNOSIS — Z9181 History of falling: Secondary | ICD-10-CM | POA: Diagnosis not present

## 2016-08-08 DIAGNOSIS — Z803 Family history of malignant neoplasm of breast: Secondary | ICD-10-CM | POA: Insufficient documentation

## 2016-08-08 DIAGNOSIS — R443 Hallucinations, unspecified: Secondary | ICD-10-CM | POA: Diagnosis not present

## 2016-08-08 DIAGNOSIS — R5383 Other fatigue: Secondary | ICD-10-CM | POA: Insufficient documentation

## 2016-08-08 DIAGNOSIS — H409 Unspecified glaucoma: Secondary | ICD-10-CM | POA: Insufficient documentation

## 2016-08-08 DIAGNOSIS — C880 Waldenstrom macroglobulinemia: Secondary | ICD-10-CM | POA: Insufficient documentation

## 2016-08-08 DIAGNOSIS — R410 Disorientation, unspecified: Secondary | ICD-10-CM | POA: Diagnosis not present

## 2016-08-08 DIAGNOSIS — D472 Monoclonal gammopathy: Secondary | ICD-10-CM

## 2016-08-08 DIAGNOSIS — E538 Deficiency of other specified B group vitamins: Secondary | ICD-10-CM | POA: Insufficient documentation

## 2016-08-08 DIAGNOSIS — F0390 Unspecified dementia without behavioral disturbance: Secondary | ICD-10-CM | POA: Diagnosis not present

## 2016-08-08 LAB — COMPREHENSIVE METABOLIC PANEL
ALBUMIN: 4.1 g/dL (ref 3.5–5.0)
ALK PHOS: 53 U/L (ref 38–126)
ALT: 9 U/L — AB (ref 17–63)
AST: 17 U/L (ref 15–41)
Anion gap: 7 (ref 5–15)
BUN: 22 mg/dL — AB (ref 6–20)
CALCIUM: 9.4 mg/dL (ref 8.9–10.3)
CO2: 27 mmol/L (ref 22–32)
CREATININE: 0.96 mg/dL (ref 0.61–1.24)
Chloride: 101 mmol/L (ref 101–111)
GFR calc Af Amer: >60 mL/min (ref >60)
GFR calc non Af Amer: >60 mL/min (ref >60)
GLUCOSE: 191 mg/dL — AB (ref 65–99)
Potassium: 3.7 mmol/L (ref 3.5–5.1)
SODIUM: 135 mmol/L (ref 135–145)
Total Bilirubin: 0.5 mg/dL (ref 0.3–1.2)
Total Protein: 9.2 g/dL — ABNORMAL HIGH (ref 6.5–8.1)

## 2016-08-08 LAB — CBC WITH DIFFERENTIAL/PLATELET
BASOS ABS: 0.1 10*3/uL (ref 0–0.1)
BASOS PCT: 1 %
EOS ABS: 0.1 10*3/uL (ref 0–0.7)
Eosinophils Relative: 2 %
HCT: 42.2 % (ref 40.0–52.0)
HEMOGLOBIN: 14.3 g/dL (ref 13.0–18.0)
LYMPHS ABS: 1.9 10*3/uL (ref 1.0–3.6)
Lymphocytes Relative: 26 %
MCH: 31.2 pg (ref 26.0–34.0)
MCHC: 33.8 g/dL (ref 32.0–36.0)
MCV: 92.2 fL (ref 80.0–100.0)
Monocytes Absolute: 0.6 10*3/uL (ref 0.2–1.0)
Monocytes Relative: 9 %
NEUTROS PCT: 64 %
Neutro Abs: 4.6 10*3/uL (ref 1.4–6.5)
Platelets: 345 10*3/uL (ref 150–440)
RBC: 4.58 MIL/uL (ref 4.40–5.90)
RDW: 13.4 % (ref 11.5–14.5)
WBC: 7.3 10*3/uL (ref 3.8–10.6)

## 2016-08-09 LAB — KAPPA/LAMBDA LIGHT CHAINS
Kappa free light chain: 70.3 mg/L — ABNORMAL HIGH (ref 3.3–19.4)
Kappa, lambda light chain ratio: 6.28 — ABNORMAL HIGH (ref 0.26–1.65)
Lambda free light chains: 11.2 mg/L (ref 5.7–26.3)

## 2016-08-11 LAB — PROTEIN ELECTROPHORESIS, SERUM
A/G RATIO SPE: 0.8 (ref 0.7–1.7)
ALBUMIN ELP: 3.9 g/dL (ref 2.9–4.4)
ALPHA-1-GLOBULIN: 0.3 g/dL (ref 0.0–0.4)
Alpha-2-Globulin: 0.7 g/dL (ref 0.4–1.0)
BETA GLOBULIN: 0.9 g/dL (ref 0.7–1.3)
GAMMA GLOBULIN: 3.3 g/dL — AB (ref 0.4–1.8)
Globulin, Total: 5.2 g/dL — ABNORMAL HIGH (ref 2.2–3.9)
M-Spike, %: 2.8 g/dL — ABNORMAL HIGH
Total Protein ELP: 9.1 g/dL — ABNORMAL HIGH (ref 6.0–8.5)

## 2016-08-14 NOTE — Progress Notes (Signed)
[facility] day:  08/15/2016  Chief Complaint: Adrian Valdez is a 65 y.o. 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  HPI:  The patient previously lived in [place], [place]. He moved to [place] in late summer 2016.  He was seen in the emergency room at [facility] on 07/01/2015 with weight loss issues.  Family noted a 2 year decline in ability to perform activities of daily living.  On exam, he had a bizarre affect and some difficulty staying on course with the conversation. He underwent a workup.   Head CT without contrast on 07/01/2015 revealed mild generalized cerebral atrophy with no acute intracranial findings.  Head MRA on 07/21/2015 revealed no large vessel occlusion or flow-limiting stenosis. Scan quality was limited by tortuosity causing decreased signal in the circle of Willis.  Dr. Ouida Sills in the [facility] performed an additional workup. SPEP on 07/09/2015 revealed an 1.7 g/dL IgM monoclonal protein with kappa light chain specificity.  IgG was 598, IgM 2521 and IgA 85.  CBC on 07/07/2015 revealed a hematocrit of 42.7, hemoglobin 13.7, MCV 95.4, platelets 320,000, white count 6800 with an absolute neutrophil count of 4060. Creatinine was 0.8, calcium 9.3, protein 8.0 and albumin 3.9. Urine protein electrophoresis revealed M spike of 32%.  The patient was seen in consultation by Roxana Hires on 07/24/2015.  CBC included a hematocrit 41.4, he will 13.6, MCV 92.5, platelets 331,000, white count 7000 with an ANC of 4800.  SPEP revealed a 2.4 g/dL monoclonal spike.  Kappa free light chains were 56.65, lambda free light chains 10.75 with a ratio of 5.27 (0.26-1.65).  B12 was 154. Folate was 979. MMA was 441 (high).  UPEP revealed a 30.8% monoclonal spike, Bence Jones protein positive, kappa type.  Chest, abdomen and pelvic CT scan on 08/12/2015 revealed no  radiolucent bone lesions. There was no mediastinal or hilar adenopathy.  The ascending aorta measured 4.2 cm in diameter.  There were multiple hepatic cysts.  There was a calcification within the posterior urinary bladder possibly representing a recently passed calculus.  There was a very prominent prostate with a somewhat thickened wall urinary bladder or multiple rectosigmoid colonic diverticula.  Bone survey was ordered but not performed.  Discussions with Dr. Rudean Hitt and the patient on 08/14/2015 included completing weekly B12 then receiving monthly injections for documented B12 deficiency.  He was felt to have an IgM kappa monoclonal gammopathy of unknown significance (MGUS) for which follow-up was recommended every 6 months.  He was referred to urology. The etiology of his confusion was unclear.  Chest CT with contrast in 6 months was requested for his ascending thoracic aortic distention.  Labs on 08/08/2016 revealed a hematocrit of 42.2, hemoglobin 14.3, MCV 92.2, platelets 345,000, white count 7300 with an ANC of 4600.  Absolute ymphocyte count was 1900.  Creatinine was 0.96, calcium 9.4, protein 9.2 and albumin 4.1.  SPEP revealed a 2.8 g/dL monoclonal spike.  Kappa free light chains were 70.3, lambda free light chains 11.2 and ratio 6.28.  Symptomatically, he denies any fevers, sweats or weight loss. At some point, he had a fall onto ceramic tile.  Vision is foggy secondary to glaucoma.  He has had some issues with medications causing confusion/hallucinations.     Past Medical History:  Diagnosis Date  . 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 B12 deficiency 07/27/2015  . Enlarged prostate   .  Glaucoma   . Glaucoma   . Hypertension   . Urinary frequency   . Vitamin B 12 deficiency     Past Surgical History:  Procedure Laterality Date  . KNEE ARTHROSCOPY Right     Family History  Problem Relation Age of Onset  . Hypertension Mother   . Hypertension Father   . CAD  Father   . Breast cancer Maternal Grandmother   . Ovarian cancer Paternal Grandmother   . Kidney disease Neg Hx   . Prostate cancer Neg Hx     Social History:  reports that he has never smoked. He has never used smokeless tobacco. He reports that he drinks alcohol. He reports that he does not use drugs.  He notes that he has 6 PhDs in fine arts, music, etc from the [employer], [employer], and [employer].  He lives with his parents.  The patient is accompanied by his father  today.  Allergies:  Allergies  Allergen Reactions  . Oxybutynin Other (See Comments)    Headache  . Myrbetriq [Mirabegron] Other (See Comments)    Other reaction(s): Other (See Comments) migraine migraine     Current Medications: Current Outpatient Prescriptions  Medication Sig Dispense Refill  . erythromycin ophthalmic ointment 1 application at bedtime.    . finasteride (PROSCAR) 5 MG tablet Take 5 mg by mouth daily. Reported on 11/30/2015    . lisinopril (PRINIVIL,ZESTRIL) 10 MG tablet Take by mouth. Reported on 11/30/2015    . methazolamide (NEPTAZANE) 50 MG tablet TK 1 T PO BID  1  . timolol (BETIMOL) 0.5 % ophthalmic solution Apply to eye. Reported on 11/30/2015    . Travoprost, BAK Free, (TRAVATAN) 0.004 % SOLN ophthalmic solution 1 drop at bedtime.     No current facility-administered medications for this visit.     Review of Systems:  GENERAL:  Fatigue.  No fevers, sweats or weight loss. PERFORMANCE STATUS (ECOG):  2 HEENT:  Vision foggy secondary to glaucoma.  No runny nose, sore throat, mouth sores or tenderness. Lungs: No shortness of breath or cough.  No hemoptysis. Cardiac:  No chest pain, palpitations, orthopnea, or PND. GI:  No nausea, vomiting, diarrhea, constipation, melena or hematochezia. GU:  Prostate problems.  No urgency, frequency, dysuria, or hematuria. Musculoskeletal:  No back pain.  Right knee pain.  No muscle tenderness. Extremities:  No pain or  swelling. Skin:  No rashes or skin changes. Neuro:  Trouble with thinking.  No headache, numbness or weakness, balance or coordination issues. Endocrine:  No diabetes, thyroid issues, hot flashes or night sweats. Psych:  No mood changes, depression or anxiety. Pain:  No focal pain. Review of systems:  All other systems reviewed and found to be negative.  Physical Exam: Blood pressure 135/36, pulse 79, temperature 97.4 F (36.3 C), temperature source Tympanic, resp. rate 18, weight 210 lb 5.1 oz (95.4 kg). GENERAL:  Well developed, well nourished, sitting comfortably in the exam room in no acute distress. MENTAL STATUS:  Alert and oriented to person, place and time. HEAD:  Pearline Cables hair.  Male pattern baldness.  Normocephalic, atraumatic, face symmetric, no Cushingoid features. EYES: Pupils equal round and reactive to light and accomodation.  No conjunctivitis or scleral icterus. ENT:  Oropharynx clear without lesion.  Tongue normal. Mucous membranes moist.  RESPIRATORY:  Clear to auscultation without rales, wheezes or rhonchi. CARDIOVASCULAR:  Regular rate and rhythm without murmur, rub or gallop. ABDOMEN:  Soft, non-tender, with active bowel sounds, and  no hepatosplenomegaly.  No masses. SKIN:  No rashes, ulcers or lesions. EXTREMITIES: No edema, no skin discoloration or tenderness.  No palpable cords. LYMPH NODES: No palpable cervical, supraclavicular, axillary or inguinal adenopathy  NEUROLOGICAL: Unremarkable. PSYCH:  Appropriate.   No visits with results within 3 Day(s) from this visit.  Latest known visit with results is:  Orders Only on 08/08/2016  Component Date Value Ref Range Status  . Kappa free light chain 08/09/2016 70.3* 3.3 - 19.4 mg/L Final  . Lamda free light chains 08/09/2016 11.2  5.7 - 26.3 mg/L Final  . Kappa, lamda light chain ratio 08/09/2016 6.28* 0.26 - 1.65 Final   Comment: (NOTE) Performed At: [facility] [place], [place]  [id number] Lindon Romp MD [id number]   . Total Protein ELP 08/11/2016 9.1* 6.0 - 8.5 g/dL Final  . Albumin ELP 08/11/2016 3.9  2.9 - 4.4 g/dL Final  . Alpha-1-Globulin 08/11/2016 0.3  0.0 - 0.4 g/dL Final  . Alpha-2-Globulin 08/11/2016 0.7  0.4 - 1.0 g/dL Final  . Beta Globulin 08/11/2016 0.9  0.7 - 1.3 g/dL Final  . Gamma Globulin 08/11/2016 3.3* 0.4 - 1.8 g/dL Final  . M-Spike, % 08/11/2016 2.8* Not Observed g/dL Final  . SPE Interp. 08/11/2016 Comment   Final   Comment: (NOTE) The SPE pattern demonstrates a single peak (M-spike) in the gamma region which may represent monoclonal protein. This peak may also be caused by circulating immune complexes, cryoglobulins, C-reactive protein, fibrinogen or hemolysis.  If clinically indicated, the presence of a monoclonal gammopathy may be confirmed by immuno- fixation, as well as an evaluation of the urine for the presence of Bence-Jones protein.   . Comment 08/11/2016 Comment   Final   Comment: (NOTE) Protein electrophoresis scan will follow via computer, mail, or courier delivery.   Marland Kitchen GLOBULIN, TOTAL 08/11/2016 5.2* 2.2 - 3.9 g/dL Corrected  . A/G Ratio 08/11/2016 0.8  0.7 - 1.7 Corrected  . WBC 08/08/2016 7.3  3.8 - 10.6 K/uL Final  . RBC 08/08/2016 4.58  4.40 - 5.90 MIL/uL Final  . Hemoglobin 08/08/2016 14.3  13.0 - 18.0 g/dL Final  . HCT 08/08/2016 42.2  40.0 - 52.0 % Final  . MCV 08/08/2016 92.2  80.0 - 100.0 fL Final  . MCH 08/08/2016 31.2  26.0 - 34.0 pg Final  . MCHC 08/08/2016 33.8  32.0 - 36.0 g/dL Final  . RDW 08/08/2016 13.4  11.5 - 14.5 % Final  . Platelets 08/08/2016 345  150 - 440 K/uL Final  . Neutrophils Relative % 08/08/2016 64  % Final  . Neutro Abs 08/08/2016 4.6  1.4 - 6.5 K/uL Final  . Lymphocytes Relative 08/08/2016 26  % Final  . Lymphs Abs 08/08/2016 1.9  1.0 - 3.6 K/uL Final  . Monocytes Relative 08/08/2016 9  % Final  . Monocytes Absolute 08/08/2016 0.6  0.2 - 1.0 K/uL Final  . Eosinophils  Relative 08/08/2016 2  % Final  . Eosinophils Absolute 08/08/2016 0.1  0 - 0.7 K/uL Final  . Basophils Relative 08/08/2016 1  % Final  . Basophils Absolute 08/08/2016 0.1  0 - 0.1 K/uL Final  . Sodium 08/08/2016 135  135 - 145 mmol/L Final  . Potassium 08/08/2016 3.7  3.5 - 5.1 mmol/L Final  . Chloride 08/08/2016 101  101 - 111 mmol/L Final  . CO2 08/08/2016 27  22 - 32 mmol/L Final  . Glucose, Bld 08/08/2016 191* 65 - 99 mg/dL Final  . BUN 08/08/2016  22* 6 - 20 mg/dL Final  . Creatinine, Ser 08/08/2016 0.96  0.61 - 1.24 mg/dL Final  . Calcium 08/08/2016 9.4  8.9 - 10.3 mg/dL Final  . Total Protein 08/08/2016 9.2* 6.5 - 8.1 g/dL Final  . Albumin 08/08/2016 4.1  3.5 - 5.0 g/dL Final  . AST 08/08/2016 17  15 - 41 U/L Final  . ALT 08/08/2016 9* 17 - 63 U/L Final  . Alkaline Phosphatase 08/08/2016 53  38 - 126 U/L Final  . Total Bilirubin 08/08/2016 0.5  0.3 - 1.2 mg/dL Final  . GFR calc non Af Amer 08/08/2016 >60  >60 mL/min Final  . GFR calc Af Amer 08/08/2016 >60  >60 mL/min Final   Comment: (NOTE) The eGFR has been calculated using the CKD EPI equation. This calculation has not been validated in all clinical situations. eGFR's persistently <60 mL/min signify possible Chronic Kidney Disease.   . Anion gap 08/08/2016 7  5 - 15 Final    Assessment:  Adrian Valdez is a 65 y.o. male with a monoclonal IgM gammopathy with kappa light chain specificity diagnosed in 07/2015.  SPEP has been monitored:  1.6 gm/dL on 07/09/2015, 2.4 on 07/24/2015, and 2.8 on 08/11/2016.  Kappa free light chains have been monitored:  56.65 (ratio of 5.27) on 07/24/2015 and 70.3 (ratio 6.28) on 08/08/2016.  Chest, abdomen and pelvic CT scan on 08/12/2015 revealed no radiolucent bone lesions. There was no mediastinal or hilar adenopathy.  The ascending aorta measured 4.2 cm in diameter.    He has B12 deficiency on supplementation.  B12 was 154 on 07/24/2015 with an elevated MMA.  Folate was  normal.  Symptomatically, he has had a 2 year decline in his ability to perform activities of daily living.  Exam reveals no adenopathy or hepatosplenomegaly.  Plan: 1.  Discuss entire medical history, diagnosis of IgM monoclonal gammopathy and progression to Waldrenstrom's macroglobulinemia (smoldering or symptomatic), lymphoma, AL amyloidosis and IgM multiple myeloma (rare).  Discuss prior diagnosis of IgM MGUS. This requires an IgM of < 3 gm/dL, < 10% clonal plasma cells in the bone marrow, no CRAB criteria (hypercalciumia, renal insufficiency, anemia or bone lesions) or constitutional symptoms, hyperviscosity, adenopathy or hepatosplenomegaly.  Discuss need for bone marrow and bone survey.  Discuss checking serum viscosity given CNS symptoms. Discuss 24 hour urine.  2.  Labs today:  Immunoglobulins (IgG, IgA, IgM), serum viscosity, LDH, CRP, beta2-microglobulin, hepatitis B surface antigen, hepatitis B core antibody total, hepatitis C antibody.  Check B12 and folate given B12 deficiency on supplementation. 3.  24 hour UPEP with free light chains. 4.  Bone marrow aspirate and biopsy. 5.  Bone survey. 6.  RTC after above for MD assessment and discussion regarding direction of therapy.   Lequita Asal, MD  08/15/2016

## 2016-08-15 ENCOUNTER — Inpatient Hospital Stay (HOSPITAL_BASED_OUTPATIENT_CLINIC_OR_DEPARTMENT_OTHER): Payer: BLUE CROSS/BLUE SHIELD | Admitting: Hematology and Oncology

## 2016-08-15 ENCOUNTER — Other Ambulatory Visit: Payer: Self-pay

## 2016-08-15 ENCOUNTER — Ambulatory Visit: Payer: 59

## 2016-08-15 ENCOUNTER — Inpatient Hospital Stay: Payer: BLUE CROSS/BLUE SHIELD

## 2016-08-15 VITALS — BP 135/36 | HR 79 | Temp 97.4°F | Resp 18 | Wt 210.3 lb

## 2016-08-15 DIAGNOSIS — Z9181 History of falling: Secondary | ICD-10-CM

## 2016-08-15 DIAGNOSIS — E538 Deficiency of other specified B group vitamins: Secondary | ICD-10-CM

## 2016-08-15 DIAGNOSIS — C880 Waldenstrom macroglobulinemia: Secondary | ICD-10-CM | POA: Diagnosis not present

## 2016-08-15 DIAGNOSIS — I10 Essential (primary) hypertension: Secondary | ICD-10-CM

## 2016-08-15 DIAGNOSIS — R803 Bence Jones proteinuria: Secondary | ICD-10-CM

## 2016-08-15 DIAGNOSIS — R5383 Other fatigue: Secondary | ICD-10-CM

## 2016-08-15 DIAGNOSIS — R443 Hallucinations, unspecified: Secondary | ICD-10-CM

## 2016-08-15 DIAGNOSIS — Z8041 Family history of malignant neoplasm of ovary: Secondary | ICD-10-CM

## 2016-08-15 DIAGNOSIS — R410 Disorientation, unspecified: Secondary | ICD-10-CM

## 2016-08-15 DIAGNOSIS — Z79899 Other long term (current) drug therapy: Secondary | ICD-10-CM

## 2016-08-15 DIAGNOSIS — H409 Unspecified glaucoma: Secondary | ICD-10-CM

## 2016-08-15 DIAGNOSIS — G319 Degenerative disease of nervous system, unspecified: Secondary | ICD-10-CM

## 2016-08-15 DIAGNOSIS — D472 Monoclonal gammopathy: Secondary | ICD-10-CM

## 2016-08-15 DIAGNOSIS — Z803 Family history of malignant neoplasm of breast: Secondary | ICD-10-CM

## 2016-08-15 LAB — C-REACTIVE PROTEIN: CRP: <0.8 mg/dL (ref <1.0)

## 2016-08-15 LAB — FOLATE: Folate: 31 ng/mL (ref >5.9)

## 2016-08-15 LAB — LACTATE DEHYDROGENASE: LDH: 102 U/L (ref 98–192)

## 2016-08-15 LAB — VITAMIN B12: Vitamin B-12: 246 pg/mL (ref 180–914)

## 2016-08-15 NOTE — Progress Notes (Signed)
Patient states he had a little tightness in his chest last night.  This has since resolved with no further problems today.  Patient here today for lab results.

## 2016-08-16 ENCOUNTER — Telehealth: Payer: Self-pay | Admitting: *Deleted

## 2016-08-16 ENCOUNTER — Encounter: Payer: Self-pay | Admitting: Urology

## 2016-08-16 ENCOUNTER — Ambulatory Visit: Payer: BLUE CROSS/BLUE SHIELD | Admitting: Urology

## 2016-08-16 ENCOUNTER — Other Ambulatory Visit: Payer: Self-pay | Admitting: *Deleted

## 2016-08-16 VITALS — BP 137/86 | HR 66 | Ht 71.0 in | Wt 209.7 lb

## 2016-08-16 DIAGNOSIS — N401 Enlarged prostate with lower urinary tract symptoms: Secondary | ICD-10-CM

## 2016-08-16 DIAGNOSIS — N138 Other obstructive and reflux uropathy: Secondary | ICD-10-CM | POA: Diagnosis not present

## 2016-08-16 DIAGNOSIS — R3915 Urgency of urination: Secondary | ICD-10-CM | POA: Diagnosis not present

## 2016-08-16 DIAGNOSIS — Z125 Encounter for screening for malignant neoplasm of prostate: Secondary | ICD-10-CM | POA: Diagnosis not present

## 2016-08-16 DIAGNOSIS — R351 Nocturia: Secondary | ICD-10-CM

## 2016-08-16 DIAGNOSIS — E538 Deficiency of other specified B group vitamins: Secondary | ICD-10-CM

## 2016-08-16 DIAGNOSIS — N281 Cyst of kidney, acquired: Secondary | ICD-10-CM | POA: Diagnosis not present

## 2016-08-16 LAB — VISCOSITY, SERUM: Viscosity, Serum: 2.5 rel.saline — ABNORMAL HIGH (ref 1.6–1.9)

## 2016-08-16 LAB — MICROSCOPIC EXAMINATION

## 2016-08-16 LAB — HEPATITIS C ANTIBODY: HCV Ab: <0.1 s/co ratio (ref 0.0–0.9)

## 2016-08-16 LAB — URINALYSIS, COMPLETE
Bilirubin, UA: NEGATIVE
Ketones, UA: NEGATIVE
LEUKOCYTES UA: NEGATIVE
Nitrite, UA: NEGATIVE
PH UA: 5 (ref 5.0–7.5)
Specific Gravity, UA: >1.03 — ABNORMAL HIGH (ref 1.005–1.030)
UUROB: 0.2 mg/dL (ref 0.2–1.0)

## 2016-08-16 LAB — IGG, IGA, IGM
IgA: 86 mg/dL (ref 61–437)
IgG (Immunoglobin G), Serum: 726 mg/dL (ref 700–1600)
IgM, Serum: 3808 mg/dL — ABNORMAL HIGH (ref 20–172)

## 2016-08-16 LAB — HEPATITIS B SURFACE ANTIGEN: Hepatitis B Surface Ag: NEGATIVE

## 2016-08-16 LAB — HEPATITIS B CORE ANTIBODY, TOTAL: Hep B Core Total Ab: NEGATIVE

## 2016-08-16 MED ORDER — CIPROFLOXACIN HCL 500 MG PO TABS
500.0000 mg | ORAL_TABLET | Freq: Once | ORAL | Status: AC
  Administered 2016-08-16: 500 mg via ORAL

## 2016-08-16 MED ORDER — LIDOCAINE HCL 2 % EX GEL
1.0000 "application " | Freq: Once | CUTANEOUS | Status: AC
  Administered 2016-08-16: 1 via URETHRAL

## 2016-08-16 NOTE — Telephone Encounter (Signed)
Attempted to call patient r/t follow up appt, pt not home at present, will try again later.

## 2016-08-16 NOTE — Telephone Encounter (Signed)
Called patient and he was not home, mother Adrian Valdez notified of appointment for bone marrow biopsy on 08-23-16 at 9am NPO after midnight, followup with md on 12-28 at 26 pm.  Mother voiced understanding.

## 2016-08-16 NOTE — Progress Notes (Signed)
08/16/2016 6:52 AM   Adrian Valdez 07-Jul-1951 [id number]  Referring provider: Kirk Ruths, MD [place] [facility] [place], [place] [address]  No chief complaint on file.   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 Hospitalized with hallucination with trial of anticholinergic. Prostate vol 169mL by CT 2016 with medain lobe. PVR 2017 " 24 mL". Previously counseled towards simple prostatetomy / HoLEP. Cysto 08/2016 with very large trilobar hypertrophy only, no strictures / masses. Currently continuing dual medical therapy with consideration of HoLEP should bother progress substantially.   2 - Prostate Screening -  2016 - PSA 3.6 (variable finasteride compliance), Vol 127ml, PSAD 0.026 (very low)  3 - Non-Complex Right Renal Cyst - 3cm noncomplex right mid renal cyst on abd CT 2016. No coarse calcifications, enhancing nodules, or mass effect.  PMH sig for major depression, monoclonal antibody spike (follows Adrian Valdez heme-onc). 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  Today Adrian Valdez is seen in f/u above and cysto. No interval retention.    PMH: Past Medical History:  Diagnosis Date  . 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 B12 deficiency 07/27/2015  . Enlarged prostate   . Glaucoma   . Glaucoma   . Hypertension   . Urinary frequency   . Vitamin B 12 deficiency     Surgical History: Past Surgical History:  Procedure Laterality Date  . KNEE ARTHROSCOPY Right     Home Medications:    Medication List       Accurate as of 08/16/16  6:52 AM. Always use your most recent med list.          erythromycin ophthalmic ointment 1 application at bedtime.   finasteride 5 MG tablet Commonly known as:  PROSCAR Take 5 mg by mouth daily. Reported on 11/30/2015   lisinopril 10 MG  tablet Commonly known as:  PRINIVIL,ZESTRIL Take by mouth. Reported on 11/30/2015   methazolamide 50 MG tablet Commonly known as:  NEPTAZANE TK 1 T PO BID   timolol 0.5 % ophthalmic solution Commonly known as:  BETIMOL Apply to eye. Reported on 11/30/2015   Travoprost (BAK Free) 0.004 % Soln ophthalmic solution Commonly known as:  TRAVATAN 1 drop at bedtime.       Allergies:  Allergies  Allergen Reactions  . Oxybutynin Other (See Comments)    Headache  . Myrbetriq [Mirabegron] Other (See Comments)    Other reaction(s): Other (See Comments) migraine migraine     Family History: Family History  Problem Relation Age of Onset  . Hypertension Mother   . Hypertension Father   . CAD Father   . Breast cancer Maternal Grandmother   . Ovarian cancer Paternal Grandmother   . Kidney disease Neg Hx   . Prostate cancer Neg Hx     Social History:  reports that he has never smoked. He has never used smokeless tobacco. He reports that he drinks alcohol. He reports that he does not use drugs.  ROS:     Review of Systems  Gastrointestinal (upper)  : Negative for upper GI symptoms  Gastrointestinal (lower) : Negative for lower GI symptoms  Constitutional : Negative for symptoms  Skin: Negative for skin symptoms  Eyes: Negative for eye symptoms  Ear/Nose/Throat : Negative for Ear/Nose/Throat symptoms  Hematologic/Lymphatic: Negative for Hematologic/Lymphatic symptoms  Cardiovascular :  Negative for cardiovascular symptoms  Respiratory : Negative for respiratory symptoms  Endocrine: Negative for endocrine symptoms  Musculoskeletal: Negative for musculoskeletal symptoms  Neurological: Negative for neurological symptoms  Psychologic: Negative for psychiatric symptoms       Physical Exam: There were no vitals taken for this visit.  Constitutional:  Alert and oriented, No acute distress. Very blunted affect. HEENT: Adrian Valdez AT, moist mucus membranes.   Trachea midline, no masses. Cardiovascular: No clubbing, cyanosis, or edema. Respiratory: Normal respiratory effort, no increased work of breathing. GI: Abdomen is soft, nontender, nondistended, no abdominal masses GU: No CVA tenderness. DRE >80gm smooth.  Skin: No rashes, bruises or suspicious lesions. Lymph: No cervical or inguinal adenopathy. Neurologic: Grossly intact, no focal deficits, moving all 4 extremities. Psychiatric: Normal mood and affect.  Laboratory Data: Lab Results  Component Value Date   WBC 7.3 08/08/2016   HGB 14.3 08/08/2016   HCT 42.2 08/08/2016   MCV 92.2 08/08/2016   PLT 345 08/08/2016    Lab Results  Component Value Date   CREATININE 0.96 08/08/2016    No results found for: PSA  No results found for: TESTOSTERONE  No results found for: HGBA1C  Urinalysis    Component Value Date/Time   APPEARANCEUR Hazy (A) 07/19/2016 0836   GLUCOSEU Negative 07/19/2016 0836   BILIRUBINUR Negative 07/19/2016 0836   PROTEINUR 1+ (A) 07/19/2016 0836   NITRITE Negative 07/19/2016 0836   LEUKOCYTESUR Negative 07/19/2016 0836    Pertinent Imaging: as per HPI    08/16/16  CC: No chief complaint on file.   HPI:  There were no vitals taken for this visit. NED. A&Ox3. Very blunted affect.   No respiratory distress   Abd soft, NT, ND Normal phallus with bilateral descended testicles  Cystoscopy Procedure Note  Patient identification was confirmed, informed consent was obtained, and patient was prepped using Betadine solution.  Lidocaine jelly was administered per urethral meatus.    Preoperative abx where received prior to procedure.     Pre-Procedure: - Inspection reveals a normal caliber ureteral meatus.  Procedure: The flexible cystoscope was introduced without difficulty - No urethral strictures/lesions are present. 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 Rec simple prostatectomy v. HoLEP as most definitive means of management as this will otherwise likely continue to progress. Fortunately he empties well at present. At this point he wants to continue dual medical therapy and consider HoLEP should bother progress, this is certainly reasonable.   2 - Prostate Screening - up to date..   3 - Non-Complex Right Renal Cyst - no further evaluation warranted, low risk.   RTC 1mos with PVR, then anually with PVR if stable.    Alexis Frock, [facility] [address], [place], [place] [address] [phone]

## 2016-08-16 NOTE — Telephone Encounter (Signed)
Called patient to inform him that he needed to restart his B12 injections and have a lab drawn for MMA prior to injections, voiced understanding.  Informed of his appt 08-30-16 at 130 om. Voiced understanding.

## 2016-08-17 ENCOUNTER — Telehealth: Payer: Self-pay | Admitting: *Deleted

## 2016-08-17 DIAGNOSIS — C880 Waldenstrom macroglobulinemia: Secondary | ICD-10-CM | POA: Diagnosis not present

## 2016-08-17 LAB — BETA 2 MICROGLOBULIN, SERUM: Beta-2 Microglobulin: 2.5 mg/L — ABNORMAL HIGH (ref 0.6–2.4)

## 2016-08-18 ENCOUNTER — Telehealth: Payer: Self-pay | Admitting: *Deleted

## 2016-08-18 ENCOUNTER — Ambulatory Visit
Admission: RE | Admit: 2016-08-18 | Discharge: 2016-08-18 | Disposition: A | Discharge status: Home / Self Care | Payer: BLUE CROSS/BLUE SHIELD | Source: Ambulatory Visit | Attending: Internal Medicine | Admitting: Internal Medicine

## 2016-08-18 ENCOUNTER — Ambulatory Visit
Admission: RE | Admit: 2016-08-18 | Discharge: 2016-08-18 | Disposition: A | Discharge status: Home / Self Care | Payer: BLUE CROSS/BLUE SHIELD | Source: Ambulatory Visit | Attending: Hematology and Oncology | Admitting: Hematology and Oncology

## 2016-08-18 DIAGNOSIS — D472 Monoclonal gammopathy: Secondary | ICD-10-CM

## 2016-08-18 DIAGNOSIS — R899 Unspecified abnormal finding in specimens from other organs, systems and tissues: Secondary | ICD-10-CM

## 2016-08-18 NOTE — Telephone Encounter (Signed)
Spoke with mother and they prefer that he sees a neurologist in [place] [facility], [facility] called and patient made an appointment for 09-20-16 at 0900 with Dr. Manuella Ghazi.  Mother informed of appt voiced understanding.

## 2016-08-18 NOTE — Telephone Encounter (Signed)
Called patient and patient's mother reported that he has eye appointment with Dr. Karren Burly on 09-07-16, called [facility] spoke with [facility]  and asked them to add fundoscopic exam to eye appointment and fax results to Dr. Mike Gip.  Voiced understanding.

## 2016-08-19 ENCOUNTER — Other Ambulatory Visit: Payer: Self-pay | Admitting: *Deleted

## 2016-08-19 DIAGNOSIS — D472 Monoclonal gammopathy: Secondary | ICD-10-CM

## 2016-08-22 ENCOUNTER — Other Ambulatory Visit: Payer: Self-pay | Admitting: Radiology

## 2016-08-22 LAB — IFE+PROTEIN ELECTRO, 24-HR UR
% BETA, Urine: 14.3 %
ALPHA 1 URINE: 0.3 %
Albumin, U: 18.6 %
Alpha 2, Urine: 5.9 %
GAMMA GLOBULIN URINE: 60.8 %
M-SPIKE %, Urine: 32.8 % — ABNORMAL HIGH
M-Spike, Mg/24 Hr: 171 mg/24 hr — ABNORMAL HIGH
PDF: 0
Total Protein, Urine-Ur/day: 521 mg/24 hr — ABNORMAL HIGH (ref 30–150)
Total Protein, Urine: 40.1 mg/dL
Total Volume: 1300

## 2016-08-23 ENCOUNTER — Encounter: Payer: Self-pay | Admitting: Hematology and Oncology

## 2016-08-23 ENCOUNTER — Ambulatory Visit
Admission: RE | Admit: 2016-08-23 | Discharge: 2016-08-23 | Disposition: A | Discharge status: Home / Self Care | Payer: BLUE CROSS/BLUE SHIELD | Source: Ambulatory Visit | Attending: Hematology and Oncology | Admitting: Hematology and Oncology

## 2016-08-23 DIAGNOSIS — D472 Monoclonal gammopathy: Secondary | ICD-10-CM | POA: Diagnosis present

## 2016-08-23 DIAGNOSIS — C8510 Unspecified B-cell lymphoma, unspecified site: Secondary | ICD-10-CM | POA: Diagnosis not present

## 2016-08-23 HISTORY — DX: Unspecified dementia, unspecified severity, without behavioral disturbance, psychotic disturbance, mood disturbance, and anxiety: F03.90

## 2016-08-23 LAB — CBC
HCT: 37.7 % — ABNORMAL LOW (ref 40.0–52.0)
HEMOGLOBIN: 12.6 g/dL — AB (ref 13.0–18.0)
MCH: 31 pg (ref 26.0–34.0)
MCHC: 33.5 g/dL (ref 32.0–36.0)
MCV: 92.6 fL (ref 80.0–100.0)
Platelets: 247 10*3/uL (ref 150–440)
RBC: 4.07 MIL/uL — ABNORMAL LOW (ref 4.40–5.90)
RDW: 13.3 % (ref 11.5–14.5)
WBC: 6.2 10*3/uL (ref 3.8–10.6)

## 2016-08-23 LAB — DIFFERENTIAL
BASOS ABS: 0 10*3/uL (ref 0–0.1)
Band Neutrophils: 0 %
Basophils Relative: 0 %
Blasts: 0 %
EOS ABS: 0.1 10*3/uL (ref 0–0.7)
Eosinophils Relative: 2 %
LYMPHS ABS: 1.8 10*3/uL (ref 1.0–3.6)
Lymphocytes Relative: 29 %
METAMYELOCYTES PCT: 0 %
MONOS PCT: 7 %
MYELOCYTES: 0 %
Monocytes Absolute: 0.4 10*3/uL (ref 0.2–1.0)
NEUTROS PCT: 62 %
Neutro Abs: 3.9 10*3/uL (ref 1.4–6.5)
Other: 0 %
Promyelocytes Absolute: 0 %
nRBC: 0 /100 WBC

## 2016-08-23 MED ORDER — SODIUM CHLORIDE 0.9 % IV SOLN
INTRAVENOUS | Status: DC
  Administered 2016-08-23: 10:00:00 via INTRAVENOUS

## 2016-08-23 MED ORDER — HYDROCODONE-ACETAMINOPHEN 5-325 MG PO TABS
1.0000 | ORAL_TABLET | ORAL | Status: DC | PRN
  Filled 2016-08-23: qty 2

## 2016-08-23 MED ORDER — FENTANYL CITRATE (PF) 100 MCG/2ML IJ SOLN
INTRAMUSCULAR | Status: AC | PRN
  Administered 2016-08-23: 25 ug via INTRAVENOUS

## 2016-08-23 NOTE — Discharge Instructions (Signed)
Needle Biopsy, Care After °Introduction °Refer to this sheet in the next few weeks. These instructions provide you with information about caring for yourself after your procedure. Your health care provider may also give you more specific instructions. Your treatment has been planned according to current medical practices, but problems sometimes occur. Call your health care provider if you have any problems or questions after your procedure. °What can I expect after the procedure? °After your procedure, it is common to have soreness, bruising, or mild pain at the biopsy site. This should go away in a few days. °Follow these instructions at home: °· Rest as directed by your health care provider. °· Take medicines only as directed by your health care provider. °· There are many different ways to close and cover the biopsy site, including stitches (sutures), skin glue, and adhesive strips. Follow your health care provider's instructions about: °¨ Biopsy site care. °¨ Bandage (dressing) changes and removal. °¨ Biopsy site closure removal. °· Check your biopsy site every day for signs of infection. Watch for: °¨ Redness, swelling, or pain. °¨ Fluid, blood, or pus. °Contact a health care provider if: °· You have a fever. °· You have redness, swelling, or pain at the biopsy site that lasts longer than a few days. °· You have fluid, blood, or pus coming from the biopsy site. °· You feel nauseous. °· You vomit. °Get help right away if: °· You have shortness of breath. °· You have trouble breathing. °· You have chest pain. °· You feel dizzy or you faint. °· You have bleeding that does not stop with pressure or a bandage. °· You cough up blood. °· You have pain in your abdomen. °This information is not intended to replace advice given to you by your health care provider. Make sure you discuss any questions you have with your health care provider. °Document Released: 01/06/2015 Document Revised: 01/28/2016 Document Reviewed:  08/18/2014 °© 2017 Elsevier ° °

## 2016-08-23 NOTE — Procedures (Signed)
CT Bone Marrow Biopsy  Complications:  None  Blood Loss: none  See dictation in canopy pacs

## 2016-08-30 ENCOUNTER — Inpatient Hospital Stay: Payer: BLUE CROSS/BLUE SHIELD

## 2016-08-30 DIAGNOSIS — E538 Deficiency of other specified B group vitamins: Secondary | ICD-10-CM

## 2016-08-30 DIAGNOSIS — C880 Waldenstrom macroglobulinemia: Secondary | ICD-10-CM | POA: Diagnosis not present

## 2016-08-30 MED ORDER — CYANOCOBALAMIN 1000 MCG/ML IJ SOLN
1000.0000 ug | Freq: Once | INTRAMUSCULAR | Status: AC
  Administered 2016-08-30: 1000 ug via INTRAMUSCULAR
  Filled 2016-08-30: qty 1

## 2016-08-31 ENCOUNTER — Encounter: Payer: Self-pay | Admitting: Hematology and Oncology

## 2016-09-01 ENCOUNTER — Inpatient Hospital Stay (HOSPITAL_BASED_OUTPATIENT_CLINIC_OR_DEPARTMENT_OTHER): Payer: BLUE CROSS/BLUE SHIELD | Admitting: Hematology and Oncology

## 2016-09-01 VITALS — BP 130/85 | HR 67 | Temp 96.8°F | Resp 18 | Wt 212.3 lb

## 2016-09-01 DIAGNOSIS — Z8041 Family history of malignant neoplasm of ovary: Secondary | ICD-10-CM

## 2016-09-01 DIAGNOSIS — H409 Unspecified glaucoma: Secondary | ICD-10-CM

## 2016-09-01 DIAGNOSIS — Z79899 Other long term (current) drug therapy: Secondary | ICD-10-CM

## 2016-09-01 DIAGNOSIS — E538 Deficiency of other specified B group vitamins: Secondary | ICD-10-CM | POA: Diagnosis not present

## 2016-09-01 DIAGNOSIS — C880 Waldenstrom macroglobulinemia: Secondary | ICD-10-CM

## 2016-09-01 DIAGNOSIS — R4182 Altered mental status, unspecified: Secondary | ICD-10-CM

## 2016-09-01 DIAGNOSIS — R803 Bence Jones proteinuria: Secondary | ICD-10-CM | POA: Diagnosis not present

## 2016-09-01 DIAGNOSIS — I10 Essential (primary) hypertension: Secondary | ICD-10-CM

## 2016-09-01 DIAGNOSIS — C8300 Small cell B-cell lymphoma, unspecified site: Secondary | ICD-10-CM | POA: Diagnosis not present

## 2016-09-01 DIAGNOSIS — F0390 Unspecified dementia without behavioral disturbance: Secondary | ICD-10-CM

## 2016-09-01 DIAGNOSIS — Z803 Family history of malignant neoplasm of breast: Secondary | ICD-10-CM

## 2016-09-01 DIAGNOSIS — N401 Enlarged prostate with lower urinary tract symptoms: Secondary | ICD-10-CM

## 2016-09-01 DIAGNOSIS — G319 Degenerative disease of nervous system, unspecified: Secondary | ICD-10-CM

## 2016-09-01 DIAGNOSIS — Z9181 History of falling: Secondary | ICD-10-CM

## 2016-09-01 LAB — METHYLMALONIC ACID, SERUM: Methylmalonic Acid, Quantitative: 226 nmol/L (ref 0–378)

## 2016-09-01 NOTE — Progress Notes (Signed)
[facility] day:  09/01/2016  Chief Complaint: Adrian Valdez is a 65 y.o. 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  HPI:  The patient was last seen in the medical oncology clinic on 08/15/2016.  At that time he was seen for initial assessment by me.  He had a 2 year decline in his ability to perform activities of daily living.  Exam revealed no adenopathy or hepatosplenomegaly.  Because of a concern for progression to Waldenstrom's macroglobulinemia, decision was made to pursue additional testing (labs, bone survey, and bone marrow).  Labs on 08/08/2016 revealed a hematocrit of 42.2, hemoglobin 14.3, MCV 92.2, platelets 345,000, white count 7300 with an ANC of 4600.  Absolute ymphocyte count was 1900.  Creatinine was 0.96, calcium 9.4, protein 9.2 and albumin 4.1.  SPEP revealed a 2.8 g/dL monoclonal spike.  Kappa free light chains were 70.3, lambda free light chains 11.2 and ratio 6.28.  Labs on 08/15/2016 revealed an IgM of 3808.  Serum viscosity was 2.5 (1.6-1.9).  Hepatitis B surface antigen, hepatitis B core antibody, and hepatitis C antibody were negative.  LDH was 102 (normal).  CRP was < 0.8.  Beta2-microglobulin was 2.5 (0.6-2.4).  Folate was 31 and B12 was 246 (low normal).  Bone survey on 08/18/2016 revealed no evidence of bony metastatic disease.  Bone marrow biopsy on 08/23/2016 revealed a mature B-cell lymphoma with plasmacytic differentiation, predominantly paratrabecular infiltrate of lymphoplasmacytic infiltrate accounts for 10-15% of marrow space involvement.  Marrow was normocellular for age (50-60%) with trilineage hematopoiesis.  There was mild to moderate paratrabecular increase in reticulin fibers associated with lymphoplasmacytic infiltrate.  Congo red stain was negative for amyloid.  Storage iron was  present.    Bone marrow flow cytometry revealed a monotypic B-cell population (0.6% of sample) with a non-specific immunophenotype.  There was abnormal/monotypic plasma cell population (< 0.1% of sample).  Cytogenetics were normal (46, XY).  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  24 hour UPEP on 08/17/2016 revealed 521 mg protein/24 hours with an M-spike of 32.8% (171 mg/24 hours).  Bence Jones protein +, kappa type with immunofixation revealing IgM monoclonal protein with kappa light chain specificity.  Symptomatically, he denies any new symptoms.  He was seen by Dr. Alexis Valdez, urologist, for his enlarged prostate and lower urinary tract symptoms on 08/16/2016.  Decision was to continue medical management.  He notes ongoing "lack of clarity" in vision felt secondary to glaucoma.  He denies any headache, numbness/tingling in fingers or toes.  He denies any B symptoms.    He has an appointment at [facility] on 09/07/2016.  He has an appointment with Dr. Manuella Valdez, neurologist on 09/08/2016.   Past Medical History:  Diagnosis Date  . 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 B12 deficiency 07/27/2015  . Dementia   . Enlarged prostate   . Glaucoma   . Glaucoma   . Hypertension   . Urinary frequency   . Vitamin B 12 deficiency     Past Surgical History:  Procedure Laterality Date  . KNEE ARTHROSCOPY Right   . LASIK      Family History  Problem Relation Age of Onset  . Hypertension Mother   . Hypertension Father   . CAD Father   . Breast cancer Maternal Grandmother   . Ovarian cancer Paternal Grandmother   .  Kidney disease Neg Hx   . Prostate cancer Neg Hx     Social History:  reports that he has never smoked. He has never used smokeless tobacco. He reports that he drinks alcohol. He reports that he does not use drugs.  He has 6 PhDs in fine arts, music, etc from the [employer] and [employer].  He  lives with his parents.  The patient is accompanied by his father  today.  Allergies:  Allergies  Allergen Reactions  . Oxybutynin Other (See Comments)    Headache  . Myrbetriq [Mirabegron] Other (See Comments)    Other reaction(s): Other (See Comments) migraine migraine     Current Medications: Current Outpatient Prescriptions  Medication Sig Dispense Refill  . erythromycin ophthalmic ointment 1 application at bedtime.    . finasteride (PROSCAR) 5 MG tablet Take 5 mg by mouth daily. Reported on 11/30/2015    . lisinopril (PRINIVIL,ZESTRIL) 10 MG tablet Take by mouth. Reported on 11/30/2015    . methazolamide (NEPTAZANE) 50 MG tablet TK 1 T PO BID  1  . timolol (BETIMOL) 0.5 % ophthalmic solution Apply to eye. Reported on 11/30/2015    . Travoprost, BAK Free, (TRAVATAN) 0.004 % SOLN ophthalmic solution 1 drop at bedtime.     No current facility-administered medications for this visit.     Review of Systems:  GENERAL:  No new symptoms.  No fevers, sweats or weight loss. PERFORMANCE STATUS (ECOG):  2 HEENT:  "Lack of clarity in vision".  Glaucoma.  No runny nose, sore throat, mouth sores or tenderness. Lungs: No shortness of breath or cough.  No hemoptysis. Cardiac:  No chest pain, palpitations, orthopnea, or PND. GI:  No nausea, vomiting, diarrhea, constipation, melena or hematochezia. GU:  Lower urinary tract symptoms secondary to enlarged prostate (see HPI).  No urgency, frequency, dysuria, or hematuria. Musculoskeletal:  No back pain.  No joint pain.  No muscle tenderness. Extremities:  No pain or swelling. Skin:  No rashes or skin changes. Neuro:  Trouble thinking.  No headache, numbness or weakness, balance or coordination issues. Endocrine:  No diabetes, thyroid issues, hot flashes or night sweats. Psych:  No mood changes, depression or anxiety. Pain:  No focal pain. Review of systems:  All other systems reviewed and found to be negative.  Physical Exam: Blood pressure  135/36, pulse 79, temperature 97.4 F (36.3 C), temperature source Tympanic, resp. rate 18, weight 210 lb 5.1 oz (95.4 kg). GENERAL:  Well developed, well nourished, gentleman sitting comfortably in the exam room in no acute distress. MENTAL STATUS:  Alert and oriented to person, place and time. HEAD:  Pearline Cables hair.  Male pattern baldness.  Normocephalic, atraumatic, face symmetric, no Cushingoid features. EYES:  No conjunctivitis or scleral icterus. NEUROLOGICAL: Unremarkable. PSYCH:  Flat affect.   Appointment on 08/30/2016  Component Date Value Ref Range Status  . Methylmalonic Acid, Quantitative 09/01/2016 226  0 - 378 nmol/L Final   Comment: (NOTE) Performed At: [facility] [place], [place] [id number] Lindon Romp MD KK:[id number]     Assessment:  Adrian Valdez is a 65 y.o. 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  Work-up on 08/15/2016 revealed an IgM of 3808.  Serum viscosity was 2.5 (1.6-1.9).  Beta2-microglobulin was 2.5 (0.6-2.4).  Negative studies included:  hepatitis B surface antigen, hepatitis B core antibody, hepatitis  C antibody, LDH, and CRP.   Bone marrow biopsy on 08/23/2016 revealed a mature B-cell lymphoma with plasmacytic differentiation, predominantly paratrabecular infiltrate of lymphoplasmacytic infiltrate accounts for 10-15% of marrow space involvement.  Marrow was normocellular for age (50-60%) with trilineage hematopoiesis.  There was mild to moderate paratrabecular increase in reticulin fibers associated with lymphoplasmacytic infiltrate.  Congo red stain was negative for amyloid.  Storage iron was present.    Bone marrow flow cytometry revealed a monotypic B-cell population (0.6% of sample) with a non-specific immunophenotype.  There was abnormal/monotypic plasma cell population (< 0.1%  of sample).  Cytogenetics were normal (46, XY).  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  Bone survey on 08/18/2016 revealed no evidence of bony metastatic disease.  There are no CRAB criteria (hypercalciumia, renal insufficiency, anemia or bone lesions).  Concern is raised for possible contribution of Waldenstrom's to decline in performance status (mentation).  Serum viscosity is mildly elevated.  He has no palpable adenopathy or hepatosplenomegaly.   SPEP has been monitored:  1.6 gm/dL on 07/09/2015, 2.4 on 07/24/2015, and 2.8 on 08/11/2016.  Kappa free light chains have been monitored:  56.65 (ratio of 5.27) on 07/24/2015 and 70.3 (ratio 6.28) on 08/08/2016.  Chest, abdomen and pelvic CT scan on 08/12/2015 revealed no radiolucent bone lesions. There was no mediastinal or hilar adenopathy.  The ascending aorta measured 4.2 cm in diameter.    He has B12 deficiency on supplementation.  B12 was 154 on 07/24/2015 with an elevated MMA.  Folate was 31 and B12 was 246 (low normal) on 08/15/2016.  MMA was 226 (normal) on 08/30/2016.  He receives B12 monthly (last 08/30/2016).  Symptomatically, he has had a 2 year decline in his ability to perform activities of daily living.  He denies any B symptoms.  He denies any headache or paresthesias.  He has visual symptoms felt secondary to glaucoma.  Exam reveals no adenopathy or hepatosplenomegaly.  Plan: 1.  Review bone survey.  No lytic lesions.  No evidence of myeloma. 2.  Review bone marrow.  Diagnosis of Waldenstrom's macroglobulinemia with lymphoplasmacytic lymphoma.  Discuss indications for treatment.  Discuss treatment options.  Discuss head MRI and chest, abdomen, and pelvic CT scans. 3.  Schedule open head MRI in [place]. 4.  Schedule chest, abdomen, and pelvic CT scan: assess for adenopathy or hepatosplenomegaly. 5.  Discuss follow-up with opthalmology. 6.  Discuss follow-up with neurology. 7.  RTC for MD assessment  after interval studies and evaluations.   Lequita Asal, MD  09/01/2016, 3:58 PM

## 2016-09-01 NOTE — Progress Notes (Signed)
Patient here today for CT results. 

## 2016-09-08 ENCOUNTER — Ambulatory Visit (HOSPITAL_COMMUNITY)
Admission: RE | Admit: 2016-09-08 | Discharge: 2016-09-08 | Disposition: A | Discharge status: Home / Self Care | Payer: BLUE CROSS/BLUE SHIELD | Source: Ambulatory Visit | Attending: Hematology and Oncology | Admitting: Hematology and Oncology

## 2016-09-08 DIAGNOSIS — Z8673 Personal history of transient ischemic attack (TIA), and cerebral infarction without residual deficits: Secondary | ICD-10-CM | POA: Diagnosis not present

## 2016-09-08 DIAGNOSIS — R4182 Altered mental status, unspecified: Secondary | ICD-10-CM | POA: Diagnosis not present

## 2016-09-08 DIAGNOSIS — C880 Waldenstrom macroglobulinemia: Secondary | ICD-10-CM | POA: Insufficient documentation

## 2016-09-08 DIAGNOSIS — G319 Degenerative disease of nervous system, unspecified: Secondary | ICD-10-CM | POA: Insufficient documentation

## 2016-09-08 MED ORDER — GADOBENATE DIMEGLUMINE 529 MG/ML IV SOLN
20.0000 mL | Freq: Once | INTRAVENOUS | Status: AC
  Administered 2016-09-08: 20 mL via INTRAVENOUS

## 2016-09-12 ENCOUNTER — Ambulatory Visit
Admission: RE | Admit: 2016-09-12 | Discharge: 2016-09-12 | Disposition: A | Discharge status: Home / Self Care | Payer: BLUE CROSS/BLUE SHIELD | Source: Ambulatory Visit | Attending: Hematology and Oncology | Admitting: Hematology and Oncology

## 2016-09-12 DIAGNOSIS — I712 Thoracic aortic aneurysm, without rupture: Secondary | ICD-10-CM | POA: Insufficient documentation

## 2016-09-12 DIAGNOSIS — C880 Waldenstrom macroglobulinemia: Secondary | ICD-10-CM | POA: Diagnosis present

## 2016-09-12 DIAGNOSIS — N400 Enlarged prostate without lower urinary tract symptoms: Secondary | ICD-10-CM | POA: Diagnosis not present

## 2016-09-12 DIAGNOSIS — K5730 Diverticulosis of large intestine without perforation or abscess without bleeding: Secondary | ICD-10-CM | POA: Diagnosis not present

## 2016-09-12 DIAGNOSIS — K7689 Other specified diseases of liver: Secondary | ICD-10-CM | POA: Insufficient documentation

## 2016-09-12 DIAGNOSIS — K4020 Bilateral inguinal hernia, without obstruction or gangrene, not specified as recurrent: Secondary | ICD-10-CM | POA: Diagnosis not present

## 2016-09-12 MED ORDER — IOPAMIDOL (ISOVUE-300) INJECTION 61%
100.0000 mL | Freq: Once | INTRAVENOUS | Status: AC | PRN
  Administered 2016-09-12: 100 mL via INTRAVENOUS

## 2016-09-15 ENCOUNTER — Inpatient Hospital Stay: Payer: BLUE CROSS/BLUE SHIELD | Attending: Hematology and Oncology | Admitting: Hematology and Oncology

## 2016-09-15 ENCOUNTER — Telehealth: Payer: Self-pay | Admitting: *Deleted

## 2016-09-15 VITALS — BP 148/95 | HR 78 | Temp 95.4°F | Resp 18 | Wt 215.8 lb

## 2016-09-15 DIAGNOSIS — R451 Restlessness and agitation: Secondary | ICD-10-CM | POA: Diagnosis not present

## 2016-09-15 DIAGNOSIS — Z803 Family history of malignant neoplasm of breast: Secondary | ICD-10-CM | POA: Diagnosis not present

## 2016-09-15 DIAGNOSIS — Z79899 Other long term (current) drug therapy: Secondary | ICD-10-CM | POA: Insufficient documentation

## 2016-09-15 DIAGNOSIS — R4182 Altered mental status, unspecified: Secondary | ICD-10-CM

## 2016-09-15 DIAGNOSIS — N400 Enlarged prostate without lower urinary tract symptoms: Secondary | ICD-10-CM | POA: Diagnosis not present

## 2016-09-15 DIAGNOSIS — R51 Headache: Secondary | ICD-10-CM | POA: Diagnosis not present

## 2016-09-15 DIAGNOSIS — N320 Bladder-neck obstruction: Secondary | ICD-10-CM | POA: Diagnosis not present

## 2016-09-15 DIAGNOSIS — Z8673 Personal history of transient ischemic attack (TIA), and cerebral infarction without residual deficits: Secondary | ICD-10-CM | POA: Diagnosis not present

## 2016-09-15 DIAGNOSIS — H409 Unspecified glaucoma: Secondary | ICD-10-CM | POA: Diagnosis not present

## 2016-09-15 DIAGNOSIS — D472 Monoclonal gammopathy: Secondary | ICD-10-CM | POA: Insufficient documentation

## 2016-09-15 DIAGNOSIS — R630 Anorexia: Secondary | ICD-10-CM | POA: Insufficient documentation

## 2016-09-15 DIAGNOSIS — K4020 Bilateral inguinal hernia, without obstruction or gangrene, not specified as recurrent: Secondary | ICD-10-CM | POA: Diagnosis not present

## 2016-09-15 DIAGNOSIS — C880 Waldenstrom macroglobulinemia: Secondary | ICD-10-CM | POA: Diagnosis not present

## 2016-09-15 DIAGNOSIS — F0390 Unspecified dementia without behavioral disturbance: Secondary | ICD-10-CM | POA: Insufficient documentation

## 2016-09-15 DIAGNOSIS — E538 Deficiency of other specified B group vitamins: Secondary | ICD-10-CM | POA: Insufficient documentation

## 2016-09-15 DIAGNOSIS — H538 Other visual disturbances: Secondary | ICD-10-CM | POA: Insufficient documentation

## 2016-09-15 DIAGNOSIS — I10 Essential (primary) hypertension: Secondary | ICD-10-CM | POA: Diagnosis not present

## 2016-09-15 DIAGNOSIS — K7689 Other specified diseases of liver: Secondary | ICD-10-CM | POA: Diagnosis not present

## 2016-09-15 DIAGNOSIS — Z8041 Family history of malignant neoplasm of ovary: Secondary | ICD-10-CM | POA: Diagnosis not present

## 2016-09-15 DIAGNOSIS — R634 Abnormal weight loss: Secondary | ICD-10-CM | POA: Insufficient documentation

## 2016-09-15 NOTE — Progress Notes (Signed)
Patient refused to wear ID bracelet today.  Patient accompanied by father and sister in law, Juliann Pulse.  Juliann Pulse was carrying the bracelet for ID purposes.  Patient seemed agitated when asked to state his name and DOB.  I explained to him the hospital policy for ID and assured him our policy is for his protection/safety.  He hesitated with every question that was asked on the assessment.  Patient states he has not slept in months.  Patient concerned about his progression and anxious about CT results.

## 2016-09-15 NOTE — Patient Instructions (Signed)
Rituximab injection What is this medicine? RITUXIMAB (ri TUX i mab) is a monoclonal antibody. It is used to treat non-Hodgkin lymphoma and chronic lymphocytic leukemia. It is also used to treat rheumatoid arthritis (RA). In RA, this medicine slows the inflammatory process and help reduce joint pain and swelling. This medicine is often used with other cancer or arthritis medications. This medicine may be used for other purposes; ask your health care provider or pharmacist if you have questions. COMMON BRAND NAME(S): Rituxan What should I tell my health care provider before I take this medicine? They need to know if you have any of these conditions: -heart disease -infection (especially a virus infection such as hepatitis B, chickenpox, cold sores, or herpes) -immune system problems -irregular heartbeat -kidney disease -lung or breathing disease, like asthma -recently received or scheduled to receive a vaccine -an unusual or allergic reaction to rituximab, mouse proteins, other medicines, foods, dyes, or preservatives -pregnant or trying to get pregnant -breast-feeding How should I use this medicine? This medicine is for infusion into a vein. It is administered in a hospital or clinic by a specially trained health care professional. A special MedGuide will be given to you by the pharmacist with each prescription and refill. Be sure to read this information carefully each time. Talk to your pediatrician regarding the use of this medicine in children. This medicine is not approved for use in children. Overdosage: If you think you have taken too much of this medicine contact a poison control center or emergency room at once. NOTE: This medicine is only for you. Do not share this medicine with others. What if I miss a dose? It is important not to miss a dose. Call your doctor or health care professional if you are unable to keep an appointment. What may interact with this  medicine? -cisplatin -medicines for blood pressure -some other medicines for arthritis -vaccines This list may not describe all possible interactions. Give your health care provider a list of all the medicines, herbs, non-prescription drugs, or dietary supplements you use. Also tell them if you smoke, drink alcohol, or use illegal drugs. Some items may interact with your medicine. What should I watch for while using this medicine? Report any side effects that you notice during your treatment right away, such as changes in your breathing, fever, chills, dizziness or lightheadedness. These effects are more common with the first dose. Visit your prescriber or health care professional for checks on your progress. You will need to have regular blood work. Report any other side effects. The side effects of this medicine can continue after you finish your treatment. Continue your course of treatment even though you feel ill unless your doctor tells you to stop. Call your doctor or health care professional for advice if you get a fever, chills or sore throat, or other symptoms of a cold or flu. Do not treat yourself. This drug decreases your body's ability to fight infections. Try to avoid being around people who are sick. This medicine may increase your risk to bruise or bleed. Call your doctor or health care professional if you notice any unusual bleeding. Be careful brushing and flossing your teeth or using a toothpick because you may get an infection or bleed more easily. If you have any dental work done, tell your dentist you are receiving this medicine. Avoid taking products that contain aspirin, acetaminophen, ibuprofen, naproxen, or ketoprofen unless instructed by your doctor. These medicines may hide a fever. Do not become pregnant   while taking this medicine. Women should inform their doctor if they wish to become pregnant or think they might be pregnant. There is a potential for serious side effects  to an unborn child. Talk to your health care professional or pharmacist for more information. Do not breast-feed an infant while taking this medicine. What side effects may I notice from receiving this medicine? Side effects that you should report to your doctor or health care professional as soon as possible: -allergic reactions like skin rash, itching or hives, swelling of the face, lips, or tongue -low blood counts - this medicine may decrease the number of white blood cells, red blood cells and platelets. You may be at increased risk for infections and bleeding. -signs of infection - fever or chills, cough, sore throat, pain or difficulty passing urine -signs of decreased platelets or bleeding - bruising, pinpoint red spots on the skin, black, tarry stools, blood in the urine -signs of decreased red blood cells - unusually weak or tired, fainting spells, lightheadedness -breathing problems -confused, not responsive -chest pain -fast, irregular heartbeat -feeling faint or lightheaded, falls -mouth sores -redness, blistering, peeling or loosening of the skin, including inside the mouth -stomach pain -swelling of the ankles, feet, or hands -trouble passing urine or change in the amount of urine Side effects that usually do not require medical attention (report to your doctor or health care professional if they continue or are bothersome): -anxiety -headache -loss of appetite -muscle aches -nausea -night sweats This list may not describe all possible side effects. Call your doctor for medical advice about side effects. You may report side effects to FDA at 1-800-FDA-1088. Where should I keep my medicine? This drug is given in a hospital or clinic and will not be stored at home. NOTE: This sheet is a summary. It may not cover all possible information. If you have questions about this medicine, talk to your doctor, pharmacist, or health care provider.  2017 Elsevier/Gold Standard  (2016-03-03 17:23:26)  

## 2016-09-15 NOTE — Progress Notes (Signed)
[facility] day:  09/15/2016  Chief Complaint: Adrian Valdez is a 66 y.o. male with Waldenstrom's macroglobulinemia who is seen for review of interval CT scans and head MRI and discussion regarding direction of therapy.  HPI:  The patient was last seen in the medical oncology clinic on 09/01/2016.  At that time, interval bone marrow and bone survey were reviewed.  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  We discussed indications for treatment.  Head MRI on 09/08/2016 revealed no acute or reversible findings.  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  There was mild to moderate cerebral atrophy.  Chest, abdomen, and pelvic CT scan on 09/12/2016 revealed mild fusiform enlargement of the ascending aorta (4.2 x 4.1 cm).  There was no adenopathy in the chest, abdomen, or pelvis.  There were numerous hepatic cysts, colonic diverticulosis, an enlarged prostate with partial bladder outlet obstruction, and bilateral inguinal hernias.  Symptomatically, he is not feeling well today.  His blood pressure is elevated.  He states that his face feels hot.  He has a headache.  His vision remains cloudy.  He was seen at [facility].  He was told that he needs an operation (trabeculectomy) for his glaucoma.  He has an appointment with Dr. Manuella Ghazi, neurologist in the near future.   Past Medical History:  Diagnosis Date  . 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 B12 deficiency 07/27/2015  . Dementia   . Enlarged prostate   . Glaucoma   . Glaucoma   . Hypertension   . Urinary frequency   . Vitamin B 12 deficiency     Past Surgical History:  Procedure Laterality Date  . KNEE ARTHROSCOPY Right   . LASIK      Family History  Problem Relation Age of Onset  . Hypertension Mother   . Hypertension Father   . CAD Father   . Breast cancer Maternal Grandmother   . Ovarian cancer Paternal  Grandmother   . Kidney disease Neg Hx   . Prostate cancer Neg Hx     Social History:  reports that he has never smoked. He has never used smokeless tobacco. He reports that he drinks alcohol. He reports that he does not use drugs.  He notes that he has 6 PhDs in fine arts, music, etc from the [place], [place], and [employer].  He lives with his parents.  The patient is accompanied by his father and sister-in-law, Juliann Pulse, today.   Allergies:  Allergies  Allergen Reactions  . Oxybutynin Other (See Comments)    Headache  . Myrbetriq [Mirabegron] Other (See Comments)    Other reaction(s): Other (See Comments) migraine migraine     Current Medications: Current Outpatient Prescriptions  Medication Sig Dispense Refill  . erythromycin ophthalmic ointment 1 application at bedtime.    . finasteride (PROSCAR) 5 MG tablet Take 5 mg by mouth daily. Reported on 11/30/2015    . lisinopril (PRINIVIL,ZESTRIL) 10 MG tablet Take by mouth. Reported on 11/30/2015    . methazolamide (NEPTAZANE) 50 MG tablet TK 1 T PO BID  1  . timolol (BETIMOL) 0.5 % ophthalmic solution Apply to eye. Reported on 11/30/2015    . Travoprost, BAK Free, (TRAVATAN) 0.004 % SOLN ophthalmic solution 1 drop at bedtime.     No current facility-administered medications for this visit.     Review of Systems:  GENERAL:  Feels bad today.  No  fevers, sweats or weight loss. PERFORMANCE STATUS (ECOG):  2 HEENT:  Vision foggy secondary to glaucoma (needs surgery- see HPI).  No runny nose, sore throat, mouth sores or tenderness. Lungs: No shortness of breath or cough.  No hemoptysis. Cardiac:  No chest pain, palpitations, orthopnea, or PND. GI:  Appetite slightly decreased.  No nausea, vomiting, diarrhea, constipation, melena or hematochezia. GU:  Enlarged prostate with urinary symptoms.  No urgency, frequency, dysuria, or hematuria. Musculoskeletal:  No back pain.  Right knee pain.  No muscle  tenderness. Extremities:  No pain or swelling. Skin:  No rashes or skin changes. Neuro:  Trouble with thinking.  Headache today .  No numbness or weakness, balance or coordination issues. Endocrine:  No diabetes, thyroid issues, hot flashes or night sweats. Psych:  Not sleeping well.  No mood changes, depression or anxiety. Pain:  No focal pain. Review of systems:  All other systems reviewed and found to be negative.  Physical Exam: Blood pressure 135/36, pulse 79, temperature 97.4 F (36.3 C), temperature source Tympanic, resp. rate 18, weight 210 lb 5.1 oz (95.4 kg). GENERAL:  Well developed, well nourished, gentleman sitting in the exam room.  He is somewhat uncomfortable/agitated today. MENTAL STATUS:  Alert and oriented to person, place and time. HEAD:  Pearline Cables hair.  Male pattern baldness.  Normocephalic, atraumatic, face symmetric, no Cushingoid features. EYES:  No conjunctivitis or scleral icterus. NEUROLOGICAL: Unremarkable. PSYCH:  Flat affect.  Slighty anxious.   No visits with results within 3 Day(s) from this visit.  Latest known visit with results is:  Appointment on 08/30/2016  Component Date Value Ref Range Status  . Methylmalonic Acid, Quantitative 09/01/2016 226  0 - 378 nmol/L Final   Comment: (NOTE) Performed At: [facility] [place], [place] [id number] Lindon Romp MD WU:[id number]     Assessment:  [name] is a 66 y.o. 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  Work-up on 08/15/2016 revealed an IgM of 3808.  Serum viscosity was 2.5 (1.6-1.9).  Beta2-microglobulin was 2.5 (0.6-2.4).  Negative studies included:  hepatitis B surface antigen, hepatitis B core antibody, hepatitis C antibody, LDH, and CRP.   Bone marrow biopsy on 08/23/2016 revealed a mature B-cell lymphoma with  plasmacytic differentiation, predominantly paratrabecular infiltrate of lymphoplasmacytic infiltrate accounts for 10-15% of marrow space involvement.  Marrow was normocellular for age (50-60%) with trilineage hematopoiesis.  There was mild to moderate paratrabecular increase in reticulin fibers associated with lymphoplasmacytic infiltrate.  Congo red stain was negative for amyloid.  Storage iron was present.    Bone marrow flow cytometry revealed a monotypic B-cell population (0.6% of sample) with a non-specific immunophenotype.  There was abnormal/monotypic plasma cell population (< 0.1% of sample).  Cytogenetics were normal (46, XY).  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  Bone survey on 08/18/2016 revealed no evidence of bony metastatic disease.  There are no CRAB criteria (hypercalciumia, renal insufficiency, anemia or bone lesions).  Concern is raised for possible contribution of Waldenstrom's to decline in performance status (mentation).  Serum viscosity is mildly elevated.  He has no palpable adenopathy or hepatosplenomegaly.   SPEP has been monitored:  1.6 gm/dL on 07/09/2015, 2.4 on 07/24/2015, and 2.8 on 08/11/2016.  Kappa free light chains have been monitored:  56.65 (ratio of 5.27) on 07/24/2015 and 70.3 (ratio 6.28) on 08/08/2016.  Chest, abdomen and  pelvic CT scan on 08/12/2015 revealed no radiolucent bone lesions. There was no mediastinal or hilar adenopathy.  The ascending aorta measured 4.2 cm in diameter.  Chest, abdomen, and pelvic CT scan on 09/12/2016 revealed no adenopathy or hepatosplenomegaly.  There was mild fusiform enlargement of the ascending aorta (4.2 x 4.1 cm).  There were numerous hepatic cysts, colonic diverticulosis, an enlarged prostate with partial bladder outlet obstruction, and bilateral inguinal hernias.  Head MRI on 09/08/2016 revealed no acute or reversible findings.  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  There  was mild to moderate cerebral atrophy.  He has B12 deficiency on supplementation.  B12 was 154 on 07/24/2015 with an elevated MMA.  Folate was 31 and B12 was 246 (low normal) on 08/15/2016.  MMA was 226 (normal) on 08/30/2016.  He receives B12 monthly (last 08/30/2016).  Symptomatically, he has had a 2 year decline in his ability to perform activities of daily living. He denies any B symptoms.  He denies any headache or paresthesias.  He has visual symptoms felt secondary to glaucoma.  Exam reveals no adenopathy or hepatosplenomegaly.  Plan: 1.  Review head MRI.  No acute abnormality. 2.  Review chest, abdomen, and pelvic CT scan.  No adenopathy.  Discuss need to follow-up aneurysm. 3.  Discuss indications for treatment: hyperviscosity, neuropathy, organomegaly, adenopathy, amyloidosis, cryoglobulinemia, or cytopenia.  Discuss no indication for treatment except for possible hyperviscosity affecting vision and mentation.  Information on Rituxan. 4.  Present at tumor board. 5.  Phone follow-up with neurology and opthalmology. 6.  RTC on 09/23/2016 for MD assessment.   Lequita Asal, MD  09/15/2016, 3:54 AM

## 2016-09-15 NOTE — Telephone Encounter (Signed)
Called Dr. Tonette Bihari office and spoke with RN Patty and reported pts increased BP to him.  He stated that he would call the patient this afternoon and possibly adjust his bp medication.  Pt and family informed, voiced understanding.

## 2016-09-22 ENCOUNTER — Encounter: Payer: Self-pay | Admitting: Hematology and Oncology

## 2016-09-22 DIAGNOSIS — R4182 Altered mental status, unspecified: Secondary | ICD-10-CM | POA: Insufficient documentation

## 2016-09-23 ENCOUNTER — Inpatient Hospital Stay (HOSPITAL_BASED_OUTPATIENT_CLINIC_OR_DEPARTMENT_OTHER): Payer: BLUE CROSS/BLUE SHIELD | Admitting: Hematology and Oncology

## 2016-09-23 VITALS — BP 154/97 | HR 67 | Temp 97.9°F | Resp 18 | Wt 211.9 lb

## 2016-09-23 DIAGNOSIS — N400 Enlarged prostate without lower urinary tract symptoms: Secondary | ICD-10-CM

## 2016-09-23 DIAGNOSIS — Z8673 Personal history of transient ischemic attack (TIA), and cerebral infarction without residual deficits: Secondary | ICD-10-CM

## 2016-09-23 DIAGNOSIS — Z79899 Other long term (current) drug therapy: Secondary | ICD-10-CM

## 2016-09-23 DIAGNOSIS — H538 Other visual disturbances: Secondary | ICD-10-CM

## 2016-09-23 DIAGNOSIS — R4182 Altered mental status, unspecified: Secondary | ICD-10-CM | POA: Diagnosis not present

## 2016-09-23 DIAGNOSIS — Z803 Family history of malignant neoplasm of breast: Secondary | ICD-10-CM

## 2016-09-23 DIAGNOSIS — E538 Deficiency of other specified B group vitamins: Secondary | ICD-10-CM | POA: Diagnosis not present

## 2016-09-23 DIAGNOSIS — D472 Monoclonal gammopathy: Secondary | ICD-10-CM

## 2016-09-23 DIAGNOSIS — R451 Restlessness and agitation: Secondary | ICD-10-CM

## 2016-09-23 DIAGNOSIS — Z8041 Family history of malignant neoplasm of ovary: Secondary | ICD-10-CM

## 2016-09-23 DIAGNOSIS — N320 Bladder-neck obstruction: Secondary | ICD-10-CM

## 2016-09-23 DIAGNOSIS — H409 Unspecified glaucoma: Secondary | ICD-10-CM

## 2016-09-23 DIAGNOSIS — C880 Waldenstrom macroglobulinemia: Secondary | ICD-10-CM

## 2016-09-23 DIAGNOSIS — F0390 Unspecified dementia without behavioral disturbance: Secondary | ICD-10-CM

## 2016-09-23 DIAGNOSIS — R634 Abnormal weight loss: Secondary | ICD-10-CM

## 2016-09-23 DIAGNOSIS — K4020 Bilateral inguinal hernia, without obstruction or gangrene, not specified as recurrent: Secondary | ICD-10-CM

## 2016-09-23 DIAGNOSIS — I10 Essential (primary) hypertension: Secondary | ICD-10-CM

## 2016-09-23 DIAGNOSIS — R630 Anorexia: Secondary | ICD-10-CM

## 2016-09-23 DIAGNOSIS — K7689 Other specified diseases of liver: Secondary | ICD-10-CM

## 2016-09-23 NOTE — Progress Notes (Signed)
Adrian Valdez day:  09/23/2016  Chief Complaint: Adrian Valdez is a 66 y.o. male with Adrian Valdez who is seen for 1 week assessment after interval neurology consult.  HPI:  The patient was last seen in the medical oncology [facility]dez on 09/15/2016.  At that time, he was not feeling well.  His blood pressure was elevated.  He had a headache.  His vision remained cloudy.  We reviewed his CT scans.  There was no adenopathy or hepatosplenomegaly.  We discussed no indication for treatment except for possible hyperviscosity affecting vision and mentation.  Information was provided on Rituxan.   During the interim, he took extra doses of Aricept which caused him to have emesis.  His sister-in-law is now helping him with his pills.  He states that he is "not letting anything into my body ever again".  He has follow-up with Dr. Manuella Ghazi.  He has an EEG on 09/27/2016.   Past Medical History:  Diagnosis Date  . 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 B12 deficiency 07/27/2015  . Dementia   . Enlarged prostate   . Glaucoma   . Glaucoma   . Hypertension   . Urinary frequency   . Vitamin B 12 deficiency     Past Surgical History:  Procedure Laterality Date  . KNEE ARTHROSCOPY Right   . LASIK      Family History  Problem Relation Age of Onset  . Hypertension Mother   . Hypertension Father   . CAD Father   . Breast cancer Maternal Grandmother   . Ovarian cancer Paternal Grandmother   . Kidney disease Neg Hx   . Prostate cancer Neg Hx     Social History:  reports that he has never smoked. He has never used smokeless tobacco. He reports that he drinks alcohol. He reports that he does not use drugs.  He notes that he has 6 PhDs in fine arts, music, etc from the [place], [place], and [employer].  He lives with his parents.  The patient is accompanied by his father today.   Allergies:  Allergies   Allergen Reactions  . Oxybutynin Other (See Comments)    Headache  . Myrbetriq [Mirabegron] Other (See Comments)    Other reaction(s): Other (See Comments) migraine migraine     Current Medications: Current Outpatient Prescriptions  Medication Sig Dispense Refill  . donepezil (ARICEPT) 5 MG tablet Take by mouth.    . erythromycin ophthalmic ointment 1 application at bedtime.    . finasteride (PROSCAR) 5 MG tablet Take 5 mg by mouth daily. Reported on 11/30/2015    . lisinopril (PRINIVIL,ZESTRIL) 10 MG tablet Take by mouth. Reported on 11/30/2015    . methazolamide (NEPTAZANE) 50 MG tablet TK 1 T PO BID  1  . tamsulosin (FLOMAX) 0.4 MG CAPS capsule TAKE ONE CAPSULE BY MOUTH ONCE DAILY, 30 MINUTES AFTER THE SAME MEAL EACH DAY    . timolol (BETIMOL) 0.5 % ophthalmic solution Apply to eye. Reported on 11/30/2015    . Travoprost, BAK Free, (TRAVATAN) 0.004 % SOLN ophthalmic solution 1 drop at bedtime.    . donepezil (ARICEPT) 5 MG tablet   1   No current facility-administered medications for this visit.     Review of Systems:  GENERAL:  Feels "not very well".  No fevers or sweats.  Weight loss of 4 pounds. PERFORMANCE STATUS (ECOG):  2 HEENT:  Vision foggy secondary to glaucoma.  No runny nose, sore throat, mouth sores or tenderness. Lungs: No shortness of breath or cough.  No hemoptysis. Cardiac:  No chest pain, palpitations, orthopnea, or PND. GI:  Nausea and vomiting after taking excess Aricept.  Constipation.  No further nausea, vomiting, diarrhea, melena or hematochezia. GU:  Enlarged prostate.  No urgency, frequency, dysuria, or hematuria. Musculoskeletal:  No back pain.  Right knee issues.  No muscle tenderness. Extremities:  No pain or swelling. Skin:  No rashes or skin changes. Neuro:  Trouble with thinking.  No headache, numbness or weakness, balance or coordination issues. Endocrine:  No diabetes, thyroid issues, hot flashes or night sweats. Psych:  Not sleeping well.  No  mood changes, depression or anxiety. Pain:  No focal pain. Review of systems:  All other systems reviewed and found to be negative.  Physical Exam: Blood pressure 135/36, pulse 79, temperature 97.4 F (36.3 C), temperature source Tympanic, resp. rate 18, weight 210 lb 5.1 oz (95.4 kg). GENERAL:  Well developed, well nourished, gentleman sitting in the exam room.  He is somewhat uncomfortable/agitated today. MENTAL STATUS:  Alert and oriented to person and place.  Knows it is 2018. HEAD:  Pearline Cables hair.  Male pattern baldness.  Normocephalic, atraumatic, face symmetric, no Cushingoid features. EYES:  No conjunctivitis or scleral icterus. NEUROLOGICAL: Trouble with multi-step commands.  Confusion with numbers.  Unable to remember 3 words. PSYCH:  Flat affect.  Slighty anxious.   No visits with results within 3 Day(s) from this visit.  Latest known visit with results is:  Appointment on 08/30/2016  Component Date Value Ref Range Status  . Methylmalonic Acid, Quantitative 08/30/2016 226  0 - 378 nmol/L Final   Comment: (NOTE) Performed At: [facility] [place], [place] [id number] Lindon Romp MD OV:[id number]     Assessment:  Adrian Valdez is a 66 y.o. male with Adrian Valdez secondary to lymphoplasmacytic lymphoma.  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  Work-up on 08/15/2016 revealed an IgM of 3808.  Serum viscosity was 2.5 (1.6-1.9).  Beta2-microglobulin was 2.5 (0.6-2.4).  Negative studies included:  hepatitis B surface antigen, hepatitis B core antibody, hepatitis C antibody, LDH, and CRP.   Bone marrow biopsy on 08/23/2016 revealed a mature B-cell lymphoma with plasmacytic differentiation, predominantly paratrabecular infiltrate of lymphoplasmacytic infiltrate accounts for 10-15% of marrow space involvement.  Marrow was normocellular for age (50-60%) with trilineage  hematopoiesis.  There was mild to moderate paratrabecular increase in reticulin fibers associated with lymphoplasmacytic infiltrate.  Congo red stain was negative for amyloid.  Storage iron was present.    Bone marrow flow cytometry revealed a monotypic B-cell population (0.6% of sample) with a non-specific immunophenotype.  There was abnormal/monotypic plasma cell population (< 0.1% of sample).  Cytogenetics were normal (46, XY).  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  Bone survey on 08/18/2016 revealed no evidence of bony metastatic disease.  There are no CRAB criteria (hypercalciumia, renal insufficiency, anemia or bone lesions).  Concern is raised for possible contribution of Adrian to decline in performance status (mentation).  Serum viscosity is mildly elevated.  He has no palpable adenopathy or hepatosplenomegaly.   SPEP has been monitored:  1.6 gm/dL on 07/09/2015, 2.4 on 07/24/2015, and 2.8 on 08/11/2016.  Kappa free light chains have been monitored:  56.65 (ratio of 5.27) on 07/24/2015 and 70.3 (ratio 6.28) on 08/08/2016.  Chest, abdomen and pelvic CT scan on 08/12/2015  revealed no radiolucent bone lesions. There was no mediastinal or hilar adenopathy.  The ascending aorta measured 4.2 cm in diameter.  Chest, abdomen, and pelvic CT scan on 09/12/2016 revealed no adenopathy or hepatosplenomegaly.  There was mild fusiform enlargement of the ascending aorta (4.2 x 4.1 cm).  There were numerous hepatic cysts, colonic diverticulosis, an enlarged prostate with partial bladder outlet obstruction, and bilateral inguinal hernias.  Head MRI on 09/08/2016 revealed no acute or reversible findings.  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  There was mild to moderate cerebral atrophy.  He has B12 deficiency on supplementation.  B12 was 154 on 07/24/2015 with an elevated MMA.  Folate was 31 and B12 was 246 (low normal) on 08/15/2016.  MMA was 226  (normal) on 08/30/2016.  He receives B12 monthly (last 08/30/2016).  Symptomatically, he has had a 2 year decline in his ability to perform activities of daily living. He recently took an excess amount of Aricept with resultant nausea and vomiting.  He denies any B symptoms.  He denies any headache or paresthesias.  He has visual symptoms felt secondary to glaucoma.  Exam reveals no adenopathy or hepatosplenomegaly.  Plan: 1.  Discuss interval events. 3.  Discuss indications for treatment: hyperviscosity, neuropathy, organomegaly, adenopathy, amyloidosis, cryoglobulinemia, or cytopenia.  Discuss no indication for treatment except for possible hyperviscosity affecting vision and mentation.  Patient hesistant about any treatment given recent event with Aricept.  Reviewed side effects of Rituxan.  Discus potential chemotherapy. 4.  Present at tumor board on 09/29/2016. 5.  Phone follow-up with opthalmology. 6.  Anticipate EEG and lumbar puncture. 7.  Continue monthly B12.  Next due 09/27/2016. 8.  RTC after upcoming neurology assessment (patient';s father to call).   Lequita Asal, MD  09/23/2016, 11:31 AM

## 2016-09-23 NOTE — Progress Notes (Signed)
Patient states he was very sick for about 4 days post treatment.  States he was very nauseated and threw up multiple times each day for 4 days.  BP elevated today. 161/100 HR 74.  Recheck 162/97 HR 75

## 2016-09-27 ENCOUNTER — Inpatient Hospital Stay: Payer: BLUE CROSS/BLUE SHIELD

## 2016-09-27 DIAGNOSIS — C880 Waldenstrom macroglobulinemia: Secondary | ICD-10-CM | POA: Diagnosis not present

## 2016-09-27 DIAGNOSIS — E538 Deficiency of other specified B group vitamins: Secondary | ICD-10-CM

## 2016-09-27 MED ORDER — CYANOCOBALAMIN 1000 MCG/ML IJ SOLN
1000.0000 ug | Freq: Once | INTRAMUSCULAR | Status: AC
  Administered 2016-09-27: 1000 ug via INTRAMUSCULAR
  Filled 2016-09-27: qty 1

## 2016-10-11 ENCOUNTER — Telehealth: Payer: Self-pay | Admitting: *Deleted

## 2016-10-11 NOTE — Telephone Encounter (Signed)
Called patient's father and he reported that he saw the neurologist about 2 weeks ago, information sent to scheduling to have patient to have a f/u with Dr. Mike Gip. Father informed they would call him with appt. Voiced understanding.

## 2016-10-20 ENCOUNTER — Encounter: Payer: Self-pay | Admitting: Hematology and Oncology

## 2016-10-20 ENCOUNTER — Inpatient Hospital Stay: Payer: BLUE CROSS/BLUE SHIELD | Attending: Hematology and Oncology | Admitting: Hematology and Oncology

## 2016-10-20 VITALS — BP 129/89 | HR 75 | Temp 94.8°F | Resp 18 | Wt 208.1 lb

## 2016-10-20 DIAGNOSIS — Z803 Family history of malignant neoplasm of breast: Secondary | ICD-10-CM | POA: Diagnosis not present

## 2016-10-20 DIAGNOSIS — Z8041 Family history of malignant neoplasm of ovary: Secondary | ICD-10-CM | POA: Diagnosis not present

## 2016-10-20 DIAGNOSIS — I10 Essential (primary) hypertension: Secondary | ICD-10-CM | POA: Diagnosis not present

## 2016-10-20 DIAGNOSIS — R7989 Other specified abnormal findings of blood chemistry: Secondary | ICD-10-CM | POA: Insufficient documentation

## 2016-10-20 DIAGNOSIS — G4700 Insomnia, unspecified: Secondary | ICD-10-CM

## 2016-10-20 DIAGNOSIS — Z5111 Encounter for antineoplastic chemotherapy: Secondary | ICD-10-CM | POA: Diagnosis not present

## 2016-10-20 DIAGNOSIS — N400 Enlarged prostate without lower urinary tract symptoms: Secondary | ICD-10-CM | POA: Diagnosis not present

## 2016-10-20 DIAGNOSIS — F0390 Unspecified dementia without behavioral disturbance: Secondary | ICD-10-CM | POA: Insufficient documentation

## 2016-10-20 DIAGNOSIS — Z79899 Other long term (current) drug therapy: Secondary | ICD-10-CM | POA: Diagnosis not present

## 2016-10-20 DIAGNOSIS — E538 Deficiency of other specified B group vitamins: Secondary | ICD-10-CM | POA: Diagnosis not present

## 2016-10-20 DIAGNOSIS — H538 Other visual disturbances: Secondary | ICD-10-CM

## 2016-10-20 DIAGNOSIS — R410 Disorientation, unspecified: Secondary | ICD-10-CM | POA: Diagnosis not present

## 2016-10-20 DIAGNOSIS — F329 Major depressive disorder, single episode, unspecified: Secondary | ICD-10-CM | POA: Insufficient documentation

## 2016-10-20 DIAGNOSIS — C880 Waldenstrom macroglobulinemia: Secondary | ICD-10-CM | POA: Diagnosis present

## 2016-10-20 DIAGNOSIS — H409 Unspecified glaucoma: Secondary | ICD-10-CM | POA: Diagnosis not present

## 2016-10-20 NOTE — Progress Notes (Addendum)
[facility]-  Cancer Center  Clinic day:  10/20/2016  Chief Complaint: Adrian Valdez is a 65 y.o. male with Waldenstrom's macroglobulinemia who is seen for 1 month assessment.  HPI:  The patient was last seen in the medical oncology clinic on 09/23/2016.  At that time, he had some recent medication confusion with resultant nausea and vomiting.  He was hesistant about any treatment.   He was presented at tumor board on 09/29/2016.  Although it was unclear if treatment would result in improvement in his mentation, discussion was held regarding initiating treatment.  Testing was sent for MYD88.  Testing could not be done.  I spoke to his opthalmologist's staff.  He has no retinal vein abnormalities.  Vision issues are related to glaucoma.  He has followed up with Dr. Shah.  He underwent EEG.  EEG on 09/27/2016 revealed right temporal epileptiform discharge and left temporal focal slowing.  Findings do not necessarily indicate seizure activity, but more cortical irritability.  He does not have epileptic encephalopathy.  Paraneoplastic panel was negative.  He has not has a lumbar puncture secondary to concerns about costs.  Symptomatically, he denies any new complaints.   Past Medical History:  Diagnosis Date  . 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 B12 deficiency 07/27/2015  . Dementia   . Enlarged prostate   . Glaucoma   . Glaucoma   . Hypertension   . Urinary frequency   . Vitamin B 12 deficiency     Past Surgical History:  Procedure Laterality Date  . KNEE ARTHROSCOPY Right   . LASIK      Family History  Problem Relation Age of Onset  . Hypertension Mother   . Hypertension Father   . CAD Father   . Breast cancer Maternal Grandmother   . Ovarian cancer Paternal Grandmother   . Kidney disease Neg Hx   . Prostate cancer Neg Hx     Social History:  reports that he has never smoked. He has never used smokeless tobacco. He reports that he drinks  alcohol. He reports that he does not use drugs.  He notes that he has 6 PhDs in fine arts, music, etc from the [employer], [employer], and [employer].  He lives with his parents.  The patient is accompanied by his his brother, [name], today.   Allergies:  Allergies  Allergen Reactions  . Oxybutynin Other (See Comments)    Headache  . Myrbetriq [Mirabegron] Other (See Comments)    Other reaction(s): Other (See Comments) migraine migraine     Current Medications: Current Outpatient Prescriptions  Medication Sig Dispense Refill  . donepezil (ARICEPT) 5 MG tablet Take by mouth.    . donepezil (ARICEPT) 5 MG tablet   1  . erythromycin ophthalmic ointment 1 application at bedtime.    . finasteride (PROSCAR) 5 MG tablet Take 5 mg by mouth daily. Reported on 11/30/2015    . lisinopril (PRINIVIL,ZESTRIL) 10 MG tablet Take by mouth. Reported on 11/30/2015    . methazolamide (NEPTAZANE) 50 MG tablet TK 1 T PO BID  1  . tamsulosin (FLOMAX) 0.4 MG CAPS capsule TAKE ONE CAPSULE BY MOUTH ONCE DAILY, 30 MINUTES AFTER THE SAME MEAL EACH DAY    . timolol (BETIMOL) 0.5 % ophthalmic solution Apply to eye. Reported on 11/30/2015    . Travoprost, BAK Free, (TRAVATAN) 0.004 % SOLN ophthalmic solution 1 drop at bedtime.     No current facility-administered medications for this   visit.     Review of Systems:  GENERAL:  Feels "okl".  No fevers or sweats.  Weight loss of 3 pounds. PERFORMANCE STATUS (ECOG):  2 HEENT:  Vision foggy secondary to glaucoma.  No runny nose, sore throat, mouth sores or tenderness. Lungs: No shortness of breath or cough.  No hemoptysis. Cardiac:  No chest pain, palpitations, orthopnea, or PND. GI:  Appetite 75%.  No further nausea, vomiting, diarrhea, constipation, melena or hematochezia. GU:  Enlarged prostate.  No urgency, frequency, dysuria, or hematuria. Musculoskeletal:  No back pain.  Right knee issues.  No muscle tenderness. Extremities:  No pain or  swelling. Skin:  No rashes or skin changes. Neuro:  Trouble with thinking.  No headache, numbness or weakness, balance or coordination issues. Endocrine:  No diabetes, thyroid issues, hot flashes or night sweats. Psych:  Poor sleep.  No mood changes, depression or anxiety. Pain:  No focal pain. Review of systems:  All other systems reviewed and found to be negative.  Physical Exam: Blood pressure 135/36, pulse 79, temperature 97.4 F (36.3 C), temperature source Tympanic, resp. rate 18, weight 210 lb 5.1 oz (95.4 kg). GENERAL:  Well developed, well nourished, gentleman sitting in the exam room.  He is somewhat uncomfortable/agitated today. MENTAL STATUS:  Alert and oriented to person and place.  Date: January 2017 (at first states his birthday). HEAD:  Gray hair.  Male pattern baldness.  Normocephalic, atraumatic, face symmetric, no Cushingoid features. EYES:  No conjunctivitis or scleral icterus. NEUROLOGICAL:  Knows current and past president.  Counting backward by 3 (100, 96, 94, 91 then "can't go").  Remembers 3 of 3 words immediately and only 1 of 3 even with prompting. PSYCH:  Flat affect.  Quiet.   No visits with results within 3 Day(s) from this visit.  Latest known visit with results is:  Appointment on 08/30/2016  Component Date Value Ref Range Status  . Methylmalonic Acid, Quantitative 08/30/2016 226  0 - 378 nmol/L Final   Comment: (NOTE) Performed At: [facility] [address] [place], [place] [id number] [place] William F MD Ph:[phone]     Assessment:  Adrian Valdez is a 65 y.o. 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  Work-up on 08/15/2016 revealed an IgM of 3808.  Serum viscosity was 2.5 (1.6-1.9).  Beta2-microglobulin was 2.5 (0.6-2.4).  Negative studies included:  hepatitis B surface antigen, hepatitis B  core antibody, hepatitis C antibody, LDH, and CRP.   Bone marrow biopsy on 08/23/2016 revealed a mature B-cell lymphoma with plasmacytic differentiation, predominantly paratrabecular infiltrate of lymphoplasmacytic infiltrate accounts for 10-15% of marrow space involvement.  Marrow was normocellular for age (50-60%) with trilineage hematopoiesis.  There was mild to moderate paratrabecular increase in reticulin fibers associated with lymphoplasmacytic infiltrate.  Congo red stain was negative for amyloid.  Storage iron was present.    Bone marrow flow cytometry revealed a monotypic B-cell population (0.6% of sample) with a non-specific immunophenotype.  There was abnormal/monotypic plasma cell population (< 0.1% of sample).  Cytogenetics were normal (46, XY).  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  Bone survey on 08/18/2016 revealed no evidence of bony metastatic disease.  There are no CRAB criteria (hypercalciumia, renal insufficiency, anemia or bone lesions).  Concern is raised for possible contribution of Waldenstrom's to decline in performance status (mentation).  Serum viscosity is mildly elevated.  He has no   palpable adenopathy or hepatosplenomegaly.   SPEP has been monitored:  1.6 gm/dL on 07/09/2015, 2.4 on 07/24/2015, and 2.8 on 08/11/2016.  Kappa free light chains have been monitored:  56.65 (ratio of 5.27) on 07/24/2015 and 70.3 (ratio 6.28) on 08/08/2016.  Chest, abdomen and pelvic CT scan on 08/12/2015 revealed no radiolucent bone lesions. There was no mediastinal or hilar adenopathy.  The ascending aorta measured 4.2 cm in diameter.  Chest, abdomen, and pelvic CT scan on 09/12/2016 revealed no adenopathy or hepatosplenomegaly.  There was mild fusiform enlargement of the ascending aorta (4.2 x 4.1 cm).  There were numerous hepatic cysts, colonic diverticulosis, an enlarged prostate with partial bladder outlet obstruction, and bilateral inguinal  hernias.  Head MRI on 09/08/2016 revealed no acute or reversible findings.  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  There was mild to moderate cerebral atrophy.  EEG on 09/27/2016 revealed right temporal epileptiform discharge and left temporal focal slowing.  Paraneoplastic panel was negative.  He has not had a LP.  He has B12 deficiency on supplementation.  B12 was 154 on 07/24/2015 with an elevated MMA.  Folate was 31 and B12 was 246 (low normal) on 08/15/2016.  MMA was 226 (normal) on 08/30/2016.  He receives B12 monthly (last 09/27/2016).  Symptomatically, he has had a 2 year decline in his ability to perform activities of daily living.  He denies any B symptoms.  He denies any headache or paresthesias.  He has visual symptoms secondary to glaucoma.  Exam reveals no adenopathy or hepatosplenomegaly.  Plan: 1.  Discuss interval events.  Discuss EEG.  Discuss patient and family's thoughts about an LP.  Procedure discussed in detail with patient.  Patient plans on following up with Dr. Shah tomorrow. 2.  Discuss tumor board recommendations to try treatment.  Unclear if improvement in IgM will improve mentation.  Discuss planned treatment with Rituxan, Velcade and Decadron.  Side effects of therapy discussed in detail.  Discuss plan to begin Rituxan with second cycle secondary to potential IgM flare and hyperviscosity. Based on study published in [facility] (04/2008; Primary therapy of Waldenstrom's macroglobulinemia with bortezomib, dexamethasone, and rituximab: [facility] clinical trial 05-180), anticipate overall response rate of 96% and major response rate of 83%.  Responses occurred in a median of 1.4 months.  Discussed prophylaxis for herpes zoster. 3.  Planned treatment:            Induction:  Velcade 1.3 mg/m2 SQ on day 1,4,8, and 11.  Decadron 20-40 mg IV on day 1,4,8,11. Rituxan 375 mg/m2 IV on day 11 every 21 days x 4 cycles.           Followed by: Velcade 1.3 mg/m2 SQ on day  1,4,8, and 11.  Decadron 20-40 mg IV on day 1,4,8,and 11.  Rituxan 375 mg/m2 on day 1.  Cycle length is every 3 months x 4 cycles starting 12 weeks after the completion of induction. 4.  Phone follow-up with Dr. Shah-done. 5.  Preauth Rituxan, Velcade, Decadron. 6.  Chemotherapy class. 7.  RTC on 10/31/2016 for MD assessment, labs (CBC with diff, CMP, SPEP, IgM), and initiation of Velcade on day 1, 4, 8, 11.   Melissa C Corcoran, MD  10/20/2016, 4:17 AM  

## 2016-10-20 NOTE — Progress Notes (Signed)
Patient on plan of care prior to pathways. 

## 2016-10-20 NOTE — Patient Instructions (Signed)

## 2016-10-20 NOTE — Progress Notes (Signed)
Patient does have questions for MD today.

## 2016-10-25 ENCOUNTER — Inpatient Hospital Stay: Payer: BLUE CROSS/BLUE SHIELD

## 2016-10-30 ENCOUNTER — Other Ambulatory Visit: Payer: Self-pay | Admitting: Hematology and Oncology

## 2016-10-30 DIAGNOSIS — Z5111 Encounter for antineoplastic chemotherapy: Secondary | ICD-10-CM | POA: Insufficient documentation

## 2016-10-30 NOTE — Progress Notes (Signed)
[facility] day:  10/31/2016  Chief Complaint: Adrian Valdez is a 66 y.o. male with Waldenstrom's macroglobulinemia who is seen for assessment prior to initiation of Velcade, Decadron, and Rituxan.  HPI:  The patient was last seen in the medical oncology clinic on 10/20/2016.  At that time, he denied any new complaints.  We discuss plans to initiate chemotherapy with Velcade, Decadron, and Rituxan.  He was scheduled to see Dr. Manuella Ghazi the next day.  He saw Dr. Manuella Ghazi on 10/21/2016.  The patient was concerned about the cost of having a lumbar puncture performed.  He has decided to postpone the lumbar puncture.  He attended chemotherapy class.  During the interim, he states he has been feeling depressed. He is ready to begin treatment today. He attended chemotherapy class and does not have any additional questions. He is still uncertain about lumbar puncture due to the cost.  He denies any worsening of symptoms.   Past Medical History:  Diagnosis Date  . Ascending aorta enlargement ([place]) 08/18/2015  . B12 deficiency 07/27/2015  . Dementia   . Enlarged prostate   . Glaucoma   . Glaucoma   . Hypertension   . Urinary frequency   . Vitamin B 12 deficiency     Past Surgical History:  Procedure Laterality Date  . KNEE ARTHROSCOPY Right   . LASIK      Family History  Problem Relation Age of Onset  . Hypertension Mother   . Hypertension Father   . CAD Father   . Breast cancer Maternal Grandmother   . Ovarian cancer Paternal Grandmother   . Kidney disease Neg Hx   . Prostate cancer Neg Hx     Social History:  reports that he has never smoked. He has never used smokeless tobacco. He reports that he drinks alcohol. He reports that he does not use drugs.  He notes that he has 6 PhDs in fine arts, music, etc from the [employer], and [employer].  He lives with his parents.  The patient is accompanied by his his  sister-in-law, Tye [place], today.   Allergies:  Allergies  Allergen Reactions  . Oxybutynin Other (See Comments)    Headache  . Myrbetriq [Mirabegron] Other (See Comments)    Other reaction(s): Other (See Comments) migraine migraine     Current Medications: Current Outpatient Prescriptions  Medication Sig Dispense Refill  . donepezil (ARICEPT) 5 MG tablet Take 5 mg by mouth at bedtime.     . finasteride (PROSCAR) 5 MG tablet Take 5 mg by mouth daily. Reported on 11/30/2015    . lisinopril (PRINIVIL,ZESTRIL) 10 MG tablet Take by mouth. Reported on 11/30/2015    . methazolamide (NEPTAZANE) 50 MG tablet TK 1 T PO BID  1  . timolol (BETIMOL) 0.5 % ophthalmic solution Apply to eye. Reported on 11/30/2015    . Travoprost, BAK Free, (TRAVATAN) 0.004 % SOLN ophthalmic solution 1 drop at bedtime.    Marland Kitchen acyclovir (ZOVIRAX) 400 MG tablet Take 1 tablet (400 mg total) by mouth 2 (two) times daily. 60 tablet 3  . dexamethasone (DECADRON) 4 MG tablet Take 5 tablets in the morning prior to chemotherapy (day 1,4,8, and 11). 20 tablet 3  . erythromycin ophthalmic ointment 1 application at bedtime.    . ondansetron (ZOFRAN) 8 MG tablet Take 1 tablet (8 mg total) by mouth 2 (two) times daily as needed (Nausea or vomiting). 30 tablet 1  .  tamsulosin (FLOMAX) 0.4 MG CAPS capsule TAKE ONE CAPSULE BY MOUTH ONCE DAILY, 30 MINUTES AFTER THE SAME MEAL EACH DAY     No current facility-administered medications for this visit.     Review of Systems:  GENERAL:  Feels "ok".  No fevers or sweats.  Weight up 3 pounds. PERFORMANCE STATUS (ECOG):  2 HEENT:  Vision foggy secondary to glaucoma.  No runny nose, sore throat, mouth sores or tenderness. Lungs: No shortness of breath or cough.  No hemoptysis. Cardiac:  No chest pain, palpitations, orthopnea, or PND. GI:  Appetite ok.  No  nausea, vomiting, diarrhea, constipation, melena or hematochezia. GU:  Enlarged prostate.  No urgency, frequency, dysuria, or  hematuria. Musculoskeletal:  No back pain.  Right knee issues.  No muscle tenderness. Extremities:  No pain or swelling. Skin:  No rashes or skin changes. Neuro:  Trouble with thinking.  No headache, numbness or weakness, balance or coordination issues. Endocrine:  No diabetes, thyroid issues, hot flashes or night sweats. Psych:  Poor sleep.  No mood changes, depression or anxiety. Pain:  No focal pain. Review of systems:  All other systems reviewed and found to be negative.  Physical Exam: Blood pressure 148/96, pulse 67, temperature 97.7 F (36.3 C), temperature source Tympanic, resp. rate 18, weight 211 lb 5.1 oz (95.4 kg). GENERAL:  Well developed, well nourished, gentleman sitting in the exam room.  He is somewhat uncomfortable/agitated today. MENTAL STATUS:  Alert and oriented to person and place. Unsure of date today.  HEAD:  Pearline Cables hair.  Male pattern baldness.  Normocephalic, atraumatic, face symmetric, no Cushingoid features. EYES: Pupils equal round and reactive to light and accomodation.  No conjunctivitis or scleral icterus. ENT:  Oropharynx clear without lesion.  Tongue normal. Mucous membranes moist.  RESPIRATORY:  Clear to auscultation without rales, wheezes or rhonchi. CARDIOVASCULAR:  Regular rate and rhythm without murmur, rub or gallop. ABDOMEN:  Soft, non-tender, with active bowel sounds, and no hepatosplenomegaly.  No masses. SKIN:  No rashes, ulcers or lesions. EXTREMITIES: No edema, no skin discoloration or tenderness.  No palpable cords. LYMPH NODES: No palpable cervical, supraclavicular, axillary or inguinal adenopathy  NEUROLOGICAL:  No change. PSYCH:  Flat affect.   Appointment on 10/31/2016  Component Date Value Ref Range Status  . WBC 10/31/2016 6.3  3.8 - 10.6 K/uL Final  . RBC 10/31/2016 4.20* 4.40 - 5.90 MIL/uL Final  . Hemoglobin 10/31/2016 13.4  13.0 - 18.0 g/dL Final  . HCT 10/31/2016 38.8* 40.0 - 52.0 % Final  . MCV 10/31/2016 92.4  80.0 - 100.0 fL  Final  . MCH 10/31/2016 32.0  26.0 - 34.0 pg Final  . MCHC 10/31/2016 34.6  32.0 - 36.0 g/dL Final  . RDW 10/31/2016 12.9  11.5 - 14.5 % Final  . Platelets 10/31/2016 294  150 - 440 K/uL Final  . Neutrophils Relative % 10/31/2016 65  % Final  . Neutro Abs 10/31/2016 4.2  1.4 - 6.5 K/uL Final  . Lymphocytes Relative 10/31/2016 23  % Final  . Lymphs Abs 10/31/2016 1.4  1.0 - 3.6 K/uL Final  . Monocytes Relative 10/31/2016 8  % Final  . Monocytes Absolute 10/31/2016 0.5  0.2 - 1.0 K/uL Final  . Eosinophils Relative 10/31/2016 3  % Final  . Eosinophils Absolute 10/31/2016 0.2  0 - 0.7 K/uL Final  . Basophils Relative 10/31/2016 1  % Final  . Basophils Absolute 10/31/2016 0.1  0 - 0.1 K/uL Final  . Sodium 10/31/2016 136  135 - 145 mmol/L Final  . Potassium 10/31/2016 3.8  3.5 - 5.1 mmol/L Final  . Chloride 10/31/2016 101  101 - 111 mmol/L Final  . CO2 10/31/2016 29  22 - 32 mmol/L Final  . Glucose, Bld 10/31/2016 176* 65 - 99 mg/dL Final  . BUN 10/31/2016 13  6 - 20 mg/dL Final  . Creatinine, Ser 10/31/2016 0.86  0.61 - 1.24 mg/dL Final  . Calcium 10/31/2016 8.9  8.9 - 10.3 mg/dL Final  . Total Protein 10/31/2016 8.8* 6.5 - 8.1 g/dL Final  . Albumin 10/31/2016 3.8  3.5 - 5.0 g/dL Final  . AST 10/31/2016 20  15 - 41 U/L Final  . ALT 10/31/2016 12* 17 - 63 U/L Final  . Alkaline Phosphatase 10/31/2016 55  38 - 126 U/L Final  . Total Bilirubin 10/31/2016 0.8  0.3 - 1.2 mg/dL Final  . GFR calc non Af Amer 10/31/2016 >60  >60 mL/min Final  . GFR calc Af Amer 10/31/2016 >60  >60 mL/min Final   Comment: (NOTE) The eGFR has been calculated using the CKD EPI equation. This calculation has not been validated in all clinical situations. eGFR's persistently <60 mL/min signify possible Chronic Kidney Disease.   . Anion gap 10/31/2016 6  5 - 15 Final    Assessment:  Adrian Valdez is a 66 y.o. 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  Work-up on 08/15/2016 revealed an IgM of 3808.  Serum viscosity was 2.5 (1.6-1.9).  Beta2-microglobulin was 2.5 (0.6-2.4).  Negative studies included:  hepatitis B surface antigen, hepatitis B core antibody, hepatitis C antibody, LDH, and CRP.   Bone marrow biopsy on 08/23/2016 revealed a mature B-cell lymphoma with plasmacytic differentiation, predominantly paratrabecular infiltrate of lymphoplasmacytic infiltrate accounts for 10-15% of marrow space involvement.  Marrow was normocellular for age (50-60%) with trilineage hematopoiesis.  There was mild to moderate paratrabecular increase in reticulin fibers associated with lymphoplasmacytic infiltrate.  Congo red stain was negative for amyloid.  Storage iron was present.    Bone marrow flow cytometry revealed a monotypic B-cell population (0.6% of sample) with a non-specific immunophenotype.  There was abnormal/monotypic plasma cell population (< 0.1% of sample).  Cytogenetics were normal (46, XY).  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  Bone survey on 08/18/2016 revealed no evidence of bony metastatic disease.  There are no CRAB criteria (hypercalciumia, renal insufficiency, anemia or bone lesions).  Concern is raised for possible contribution of Waldenstrom's to decline in performance status (mentation).  Serum viscosity is mildly elevated.  He has no palpable adenopathy or hepatosplenomegaly.   SPEP has been monitored:  1.6 gm/dL on 07/09/2015, 2.4 on 07/24/2015, and 2.8 on 08/11/2016.  Kappa free light chains have been monitored:  56.65 (ratio of 5.27) on 07/24/2015 and 70.3 (ratio 6.28) on 08/08/2016.  Chest, abdomen and pelvic CT scan on 08/12/2015 revealed no radiolucent bone lesions. There was no mediastinal or hilar adenopathy.  The ascending aorta measured 4.2 cm in diameter.  Chest, abdomen, and pelvic CT scan on  09/12/2016 revealed no adenopathy or hepatosplenomegaly.  There was mild fusiform enlargement of the ascending aorta (4.2 x 4.1 cm).  There were numerous hepatic cysts, colonic diverticulosis, an enlarged prostate with partial bladder outlet obstruction, and bilateral inguinal hernias.  Head MRI on 09/08/2016 revealed no acute or reversible findings.  There were a few old small vessel cerebellar  infarctions.  There was mild to moderate cerebral atrophy.  EEG on 09/27/2016 revealed right temporal epileptiform discharge and left temporal focal slowing.  Paraneoplastic panel was negative.  He has not had an LP.  He has B12 deficiency on supplementation.  B12 was 154 on 07/24/2015 with an elevated MMA.  Folate was 31 and B12 was 246 (low normal) on 08/15/2016.  MMA was 226 (normal) on 08/30/2016.  He receives B12 monthly (last 09/27/2016).  Symptomatically, he is feeling depressed. He is ready to begin treatment. He denies any B symptoms. He has visual symptoms secondary to glaucoma. Exam reveals no adenopathy or hepatosplenomegaly.  Plan: 1.  Labs today:  CBC with diff, CMP, SPEP, IgM. 2.  Discuss treatment plan.  Discuss Velcade SQ day 1,4,8, and 11.  Discuss oral antiemetics (ondansetron).  Discuss Decadron and potential side effects (hypertension, hyperglycemia, and potential mood changes).  Discuss trying 10 mg IV today to ensure he does well.  If he does well, will increase to 20 mg orally to be given at home on the morning of his treatments.  Patient consented to treatment. 3.  Discuss acyclovir for zoster prophylaxis.   4.  Rx: Decadron, ondansetron and acyclovir. 5.  Monthly B12 today. 6.  RTC for Velcade on day 4, 8, 11 (already scheduled) 7.  RTC on day 8 (11/07/2016) for MD assessment, labs (CBC with diff, CMP, Mg), and Velcade  The patient was seen and examined.  The assessment and plan was discussed with the patient.  Numerous questions were asked and answered.   Faythe Casa,  NP  Lequita Asal, MD  10/31/2016, 5:34 PM

## 2016-10-31 ENCOUNTER — Inpatient Hospital Stay (HOSPITAL_BASED_OUTPATIENT_CLINIC_OR_DEPARTMENT_OTHER): Payer: BLUE CROSS/BLUE SHIELD | Admitting: Hematology and Oncology

## 2016-10-31 ENCOUNTER — Inpatient Hospital Stay: Payer: BLUE CROSS/BLUE SHIELD

## 2016-10-31 ENCOUNTER — Other Ambulatory Visit: Payer: Self-pay | Admitting: Hematology and Oncology

## 2016-10-31 ENCOUNTER — Ambulatory Visit: Payer: BLUE CROSS/BLUE SHIELD

## 2016-10-31 ENCOUNTER — Encounter: Payer: Self-pay | Admitting: Hematology and Oncology

## 2016-10-31 VITALS — BP 148/96 | HR 67 | Temp 97.7°F | Resp 18 | Wt 211.1 lb

## 2016-10-31 DIAGNOSIS — Z79899 Other long term (current) drug therapy: Secondary | ICD-10-CM

## 2016-10-31 DIAGNOSIS — H409 Unspecified glaucoma: Secondary | ICD-10-CM

## 2016-10-31 DIAGNOSIS — G4700 Insomnia, unspecified: Secondary | ICD-10-CM

## 2016-10-31 DIAGNOSIS — F0390 Unspecified dementia without behavioral disturbance: Secondary | ICD-10-CM | POA: Diagnosis not present

## 2016-10-31 DIAGNOSIS — F329 Major depressive disorder, single episode, unspecified: Secondary | ICD-10-CM

## 2016-10-31 DIAGNOSIS — I10 Essential (primary) hypertension: Secondary | ICD-10-CM

## 2016-10-31 DIAGNOSIS — C880 Waldenstrom macroglobulinemia: Secondary | ICD-10-CM

## 2016-10-31 DIAGNOSIS — E538 Deficiency of other specified B group vitamins: Secondary | ICD-10-CM

## 2016-10-31 DIAGNOSIS — N400 Enlarged prostate without lower urinary tract symptoms: Secondary | ICD-10-CM | POA: Diagnosis not present

## 2016-10-31 DIAGNOSIS — H538 Other visual disturbances: Secondary | ICD-10-CM

## 2016-10-31 DIAGNOSIS — Z5111 Encounter for antineoplastic chemotherapy: Secondary | ICD-10-CM

## 2016-10-31 DIAGNOSIS — R7989 Other specified abnormal findings of blood chemistry: Secondary | ICD-10-CM

## 2016-10-31 DIAGNOSIS — Z803 Family history of malignant neoplasm of breast: Secondary | ICD-10-CM

## 2016-10-31 DIAGNOSIS — Z8041 Family history of malignant neoplasm of ovary: Secondary | ICD-10-CM

## 2016-10-31 LAB — CBC WITH DIFFERENTIAL/PLATELET
Basophils Absolute: 0.1 10*3/uL (ref 0–0.1)
Basophils Relative: 1 %
Eosinophils Absolute: 0.2 10*3/uL (ref 0–0.7)
Eosinophils Relative: 3 %
HCT: 38.8 % — ABNORMAL LOW (ref 40.0–52.0)
Hemoglobin: 13.4 g/dL (ref 13.0–18.0)
Lymphocytes Relative: 23 %
Lymphs Abs: 1.4 10*3/uL (ref 1.0–3.6)
MCH: 32 pg (ref 26.0–34.0)
MCHC: 34.6 g/dL (ref 32.0–36.0)
MCV: 92.4 fL (ref 80.0–100.0)
Monocytes Absolute: 0.5 10*3/uL (ref 0.2–1.0)
Monocytes Relative: 8 %
Neutro Abs: 4.2 10*3/uL (ref 1.4–6.5)
Neutrophils Relative %: 65 %
Platelets: 294 10*3/uL (ref 150–440)
RBC: 4.2 MIL/uL — ABNORMAL LOW (ref 4.40–5.90)
RDW: 12.9 % (ref 11.5–14.5)
WBC: 6.3 10*3/uL (ref 3.8–10.6)

## 2016-10-31 LAB — COMPREHENSIVE METABOLIC PANEL
ALT: 12 U/L — ABNORMAL LOW (ref 17–63)
AST: 20 U/L (ref 15–41)
Albumin: 3.8 g/dL (ref 3.5–5.0)
Alkaline Phosphatase: 55 U/L (ref 38–126)
Anion gap: 6 (ref 5–15)
BUN: 13 mg/dL (ref 6–20)
CO2: 29 mmol/L (ref 22–32)
Calcium: 8.9 mg/dL (ref 8.9–10.3)
Chloride: 101 mmol/L (ref 101–111)
Creatinine, Ser: 0.86 mg/dL (ref 0.61–1.24)
GFR calc Af Amer: >60 mL/min (ref >60)
GFR calc non Af Amer: >60 mL/min (ref >60)
Glucose, Bld: 176 mg/dL — ABNORMAL HIGH (ref 65–99)
Potassium: 3.8 mmol/L (ref 3.5–5.1)
Sodium: 136 mmol/L (ref 135–145)
Total Bilirubin: 0.8 mg/dL (ref 0.3–1.2)
Total Protein: 8.8 g/dL — ABNORMAL HIGH (ref 6.5–8.1)

## 2016-10-31 MED ORDER — ONDANSETRON HCL 8 MG PO TABS: 8.0000 mg | ORAL_TABLET | Freq: Two times a day (BID) | ORAL | 1 refills | Status: DC | PRN

## 2016-10-31 MED ORDER — BORTEZOMIB CHEMO SQ INJECTION 3.5 MG (2.5MG/ML)
1.3000 mg/m2 | Freq: Once | INTRAMUSCULAR | Status: AC
  Administered 2016-10-31: 2.75 mg via SUBCUTANEOUS
  Filled 2016-10-31: qty 2.75

## 2016-10-31 MED ORDER — ONDANSETRON HCL 4 MG PO TABS
8.0000 mg | ORAL_TABLET | Freq: Once | ORAL | Status: AC
  Administered 2016-10-31: 8 mg via ORAL
  Filled 2016-10-31: qty 2

## 2016-10-31 MED ORDER — DEXAMETHASONE 4 MG PO TABS: ORAL_TABLET | ORAL | 3 refills | Status: DC

## 2016-10-31 MED ORDER — ACYCLOVIR 400 MG PO TABS: 400.0000 mg | ORAL_TABLET | Freq: Two times a day (BID) | ORAL | 3 refills | Status: DC

## 2016-10-31 MED ORDER — SODIUM CHLORIDE 0.9 % IV SOLN
10.0000 mg | Freq: Once | INTRAVENOUS | Status: AC
  Administered 2016-10-31: 10 mg via INTRAVENOUS
  Filled 2016-10-31: qty 1

## 2016-10-31 MED ORDER — CYANOCOBALAMIN 1000 MCG/ML IJ SOLN
1000.0000 ug | Freq: Once | INTRAMUSCULAR | Status: AC
  Administered 2016-10-31: 1000 ug via INTRAMUSCULAR
  Filled 2016-10-31: qty 1

## 2016-10-31 MED ORDER — SODIUM CHLORIDE 0.9 % IV SOLN
Freq: Once | INTRAVENOUS | Status: AC
  Administered 2016-10-31: 12:00:00 via INTRAVENOUS
  Filled 2016-10-31: qty 1000

## 2016-11-01 LAB — PROTEIN ELECTROPHORESIS, SERUM
A/G Ratio: 1 (ref 0.7–1.7)
Albumin ELP: 4.2 g/dL (ref 2.9–4.4)
Alpha-1-Globulin: 0.2 g/dL (ref 0.0–0.4)
Alpha-2-Globulin: 0.6 g/dL (ref 0.4–1.0)
Beta Globulin: 0.7 g/dL (ref 0.7–1.3)
Gamma Globulin: 2.6 g/dL — ABNORMAL HIGH (ref 0.4–1.8)
Globulin, Total: 4.1 g/dL — ABNORMAL HIGH (ref 2.2–3.9)
M-Spike, %: 2 g/dL — ABNORMAL HIGH
Total Protein ELP: 8.3 g/dL (ref 6.0–8.5)

## 2016-11-01 LAB — IGM: IgM, Serum: 3531 mg/dL — ABNORMAL HIGH (ref 20–172)

## 2016-11-03 ENCOUNTER — Other Ambulatory Visit: Payer: Self-pay | Admitting: Hematology and Oncology

## 2016-11-03 ENCOUNTER — Inpatient Hospital Stay: Payer: BLUE CROSS/BLUE SHIELD | Attending: Hematology and Oncology

## 2016-11-03 DIAGNOSIS — N400 Enlarged prostate without lower urinary tract symptoms: Secondary | ICD-10-CM | POA: Diagnosis not present

## 2016-11-03 DIAGNOSIS — Z79899 Other long term (current) drug therapy: Secondary | ICD-10-CM | POA: Diagnosis not present

## 2016-11-03 DIAGNOSIS — E538 Deficiency of other specified B group vitamins: Secondary | ICD-10-CM | POA: Insufficient documentation

## 2016-11-03 DIAGNOSIS — H409 Unspecified glaucoma: Secondary | ICD-10-CM | POA: Insufficient documentation

## 2016-11-03 DIAGNOSIS — R739 Hyperglycemia, unspecified: Secondary | ICD-10-CM | POA: Insufficient documentation

## 2016-11-03 DIAGNOSIS — R634 Abnormal weight loss: Secondary | ICD-10-CM | POA: Insufficient documentation

## 2016-11-03 DIAGNOSIS — F0390 Unspecified dementia without behavioral disturbance: Secondary | ICD-10-CM | POA: Diagnosis not present

## 2016-11-03 DIAGNOSIS — Z8673 Personal history of transient ischemic attack (TIA), and cerebral infarction without residual deficits: Secondary | ICD-10-CM | POA: Insufficient documentation

## 2016-11-03 DIAGNOSIS — C880 Waldenstrom macroglobulinemia: Secondary | ICD-10-CM | POA: Insufficient documentation

## 2016-11-03 DIAGNOSIS — Z8041 Family history of malignant neoplasm of ovary: Secondary | ICD-10-CM | POA: Insufficient documentation

## 2016-11-03 DIAGNOSIS — Z5111 Encounter for antineoplastic chemotherapy: Secondary | ICD-10-CM | POA: Diagnosis not present

## 2016-11-03 DIAGNOSIS — D472 Monoclonal gammopathy: Secondary | ICD-10-CM | POA: Diagnosis not present

## 2016-11-03 DIAGNOSIS — K5900 Constipation, unspecified: Secondary | ICD-10-CM | POA: Insufficient documentation

## 2016-11-03 DIAGNOSIS — R0789 Other chest pain: Secondary | ICD-10-CM | POA: Diagnosis not present

## 2016-11-03 DIAGNOSIS — I10 Essential (primary) hypertension: Secondary | ICD-10-CM | POA: Insufficient documentation

## 2016-11-03 DIAGNOSIS — B370 Candidal stomatitis: Secondary | ICD-10-CM | POA: Diagnosis not present

## 2016-11-03 DIAGNOSIS — Z803 Family history of malignant neoplasm of breast: Secondary | ICD-10-CM | POA: Insufficient documentation

## 2016-11-03 DIAGNOSIS — G4700 Insomnia, unspecified: Secondary | ICD-10-CM | POA: Insufficient documentation

## 2016-11-03 MED ORDER — ONDANSETRON HCL 4 MG PO TABS
8.0000 mg | ORAL_TABLET | Freq: Once | ORAL | Status: AC
  Administered 2016-11-03: 8 mg via ORAL
  Filled 2016-11-03: qty 2

## 2016-11-03 MED ORDER — BORTEZOMIB CHEMO SQ INJECTION 3.5 MG (2.5MG/ML)
1.3000 mg/m2 | Freq: Once | INTRAMUSCULAR | Status: AC
  Administered 2016-11-03: 2.75 mg via SUBCUTANEOUS
  Filled 2016-11-03: qty 2.75

## 2016-11-04 ENCOUNTER — Ambulatory Visit: Payer: BLUE CROSS/BLUE SHIELD

## 2016-11-06 NOTE — Progress Notes (Signed)
[facility] day:  11/09/2016  Chief Complaint: Therin Vetsch is a 66 y.o. male with Waldenstrom's macroglobulinemia who is seen for assessment prior to initiation of Velcade, Decadron, and Rituxan.  HPI:  Patient returns to clinic today for further evaluation and continuation of treatment. He continues to have problems urinating but this is chronic and unchanged. He also admits to occasional constipation. He otherwise feels well. He has no neurologic complaints. He does not complain of weakness fatigue today. He denies any recent fevers. He has no chest pain or shortness of breath. He denies any nausea, vomiting, or diarrhea. Patient offers no further specific complaints today.   Past Medical History:  Diagnosis Date  . 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 B12 deficiency 07/27/2015  . Dementia   . Enlarged prostate   . Glaucoma   . Glaucoma   . Hypertension   . Urinary frequency   . Vitamin B 12 deficiency     Past Surgical History:  Procedure Laterality Date  . KNEE ARTHROSCOPY Right   . LASIK      Family History  Problem Relation Age of Onset  . Hypertension Mother   . Hypertension Father   . CAD Father   . Breast cancer Maternal Grandmother   . Ovarian cancer Paternal Grandmother   . Kidney disease Neg Hx   . Prostate cancer Neg Hx     Social History:  reports that he has never smoked. He has never used smokeless tobacco. He reports that he drinks alcohol. He reports that he does not use drugs.  He notes that he has 6 PhDs in fine arts, music, etc from the [place], [place], and [employer].  He lives with his parents.  The patient is accompanied by his his sister-in-law, Tye [place], today.   Allergies:  Allergies  Allergen Reactions  . Oxybutynin Other (See Comments)    Headache  . Myrbetriq [Mirabegron] Other (See Comments)    Other reaction(s): Other (See Comments) migraine migraine      Current Medications: Current Outpatient Prescriptions  Medication Sig Dispense Refill  . acyclovir (ZOVIRAX) 400 MG tablet Take 1 tablet (400 mg total) by mouth 2 (two) times daily. 60 tablet 3  . dexamethasone (DECADRON) 4 MG tablet Take 5 tablets in the morning prior to chemotherapy (day 1,4,8, and 11). 20 tablet 3  . donepezil (ARICEPT) 5 MG tablet Take 5 mg by mouth at bedtime.     Marland Kitchen erythromycin ophthalmic ointment 1 application at bedtime.    Marland Kitchen lisinopril (PRINIVIL,ZESTRIL) 10 MG tablet Take by mouth. Reported on 11/30/2015    . methazolamide (NEPTAZANE) 50 MG tablet TK 1 T PO BID  1  . ondansetron (ZOFRAN) 8 MG tablet Take 1 tablet (8 mg total) by mouth 2 (two) times daily as needed (Nausea or vomiting). 30 tablet 1  . tamsulosin (FLOMAX) 0.4 MG CAPS capsule TAKE ONE CAPSULE BY MOUTH ONCE DAILY, 30 MINUTES AFTER THE SAME MEAL EACH DAY    . timolol (BETIMOL) 0.5 % ophthalmic solution Apply to eye. Reported on 11/30/2015    . Travoprost, BAK Free, (TRAVATAN) 0.004 % SOLN ophthalmic solution 1 drop at bedtime.    . finasteride (PROSCAR) 5 MG tablet Take 5 mg by mouth daily. Reported on 11/30/2015     No current facility-administered medications for this visit.     Review of Systems:  GENERAL:  Feels "ok".  No fevers or sweats.  PERFORMANCE STATUS (ECOG):  2 HEENT:  Vision foggy secondary to glaucoma.  No runny nose, sore throat, mouth sores or tenderness. Lungs: No shortness of breath or cough.  No hemoptysis. Cardiac:  No chest pain, palpitations, orthopnea, or PND. GI:  Appetite ok.  No  nausea, vomiting, diarrhea, constipation, melena or hematochezia. GU:  Enlarged prostate.  No urgency, frequency, dysuria, or hematuria. Musculoskeletal:  No back pain.  Right knee issues.  No muscle tenderness. Extremities:  No pain or swelling. Skin:  No rashes or skin changes. Neuro:  Trouble with thinking.  No headache, numbness or weakness, balance or coordination issues. Endocrine:  No  diabetes, thyroid issues, hot flashes or night sweats. Psych:  Poor sleep.  No mood changes, depression or anxiety. Pain:  No focal pain.  Review of systems:  All other systems reviewed and found to be negative.  Physical Exam: Vitals:   11/07/16 1335  BP: (!) 163/93  Pulse: 71  Resp: 18  Temp: 98.2 F (36.8 C)    GENERAL:  Well developed, well nourished, gentleman sitting in the exam room.  He is somewhat uncomfortable/agitated today. MENTAL STATUS:  Alert and oriented to person and place.   HEAD:  Pearline Cables hair.  Male pattern baldness.  Normocephalic, atraumatic, face symmetric, no Cushingoid features. EYES: Pupils equal round and reactive to light and accomodation.  No conjunctivitis or scleral icterus. ENT:  Oropharynx clear without lesion.  Tongue normal. Mucous membranes moist.  RESPIRATORY:  Clear to auscultation without rales, wheezes or rhonchi. CARDIOVASCULAR:  Regular rate and rhythm without murmur, rub or gallop. ABDOMEN:  Soft, non-tender, with active bowel sounds, and no hepatosplenomegaly.  No masses. SKIN:  No rashes, ulcers or lesions. EXTREMITIES: No edema, no skin discoloration or tenderness.  No palpable cords. LYMPH NODES: No palpable cervical, supraclavicular, axillary or inguinal adenopathy  NEUROLOGICAL:  No change. PSYCH:  Flat affect.   Appointment on 11/07/2016  Component Date Value Ref Range Status  . Sodium 11/07/2016 133* 135 - 145 mmol/L Final  . Potassium 11/07/2016 3.6  3.5 - 5.1 mmol/L Final  . Chloride 11/07/2016 99* 101 - 111 mmol/L Final  . CO2 11/07/2016 30  22 - 32 mmol/L Final  . Glucose, Bld 11/07/2016 247* 65 - 99 mg/dL Final  . BUN 11/07/2016 17  6 - 20 mg/dL Final  . Creatinine, Ser 11/07/2016 0.78  0.61 - 1.24 mg/dL Final  . Calcium 11/07/2016 9.2  8.9 - 10.3 mg/dL Final  . Total Protein 11/07/2016 7.8  6.5 - 8.1 g/dL Final  . Albumin 11/07/2016 3.5  3.5 - 5.0 g/dL Final  . AST 11/07/2016 14* 15 - 41 U/L Final  . ALT 11/07/2016 11*  17 - 63 U/L Final  . Alkaline Phosphatase 11/07/2016 54  38 - 126 U/L Final  . Total Bilirubin 11/07/2016 0.4  0.3 - 1.2 mg/dL Final  . GFR calc non Af Amer 11/07/2016 >60  >60 mL/min Final  . GFR calc Af Amer 11/07/2016 >60  >60 mL/min Final   Comment: (NOTE) The eGFR has been calculated using the CKD EPI equation. This calculation has not been validated in all clinical situations. eGFR's persistently <60 mL/min signify possible Chronic Kidney Disease.   . Anion gap 11/07/2016 4* 5 - 15 Final  . WBC 11/07/2016 7.9  3.8 - 10.6 K/uL Final  . RBC 11/07/2016 3.99* 4.40 - 5.90 MIL/uL Final  . Hemoglobin 11/07/2016 12.6* 13.0 - 18.0 g/dL Final  . HCT 11/07/2016 37.2* 40.0 - 52.0 %  Final  . MCV 11/07/2016 93.1  80.0 - 100.0 fL Final  . MCH 11/07/2016 31.6  26.0 - 34.0 pg Final  . MCHC 11/07/2016 34.0  32.0 - 36.0 g/dL Final  . RDW 11/07/2016 13.2  11.5 - 14.5 % Final  . Platelets 11/07/2016 252  150 - 440 K/uL Final  . Neutrophils Relative % 11/07/2016 66  % Final  . Neutro Abs 11/07/2016 5.3  1.4 - 6.5 K/uL Final  . Lymphocytes Relative 11/07/2016 23  % Final  . Lymphs Abs 11/07/2016 1.8  1.0 - 3.6 K/uL Final  . Monocytes Relative 11/07/2016 8  % Final  . Monocytes Absolute 11/07/2016 0.6  0.2 - 1.0 K/uL Final  . Eosinophils Relative 11/07/2016 2  % Final  . Eosinophils Absolute 11/07/2016 0.1  0 - 0.7 K/uL Final  . Basophils Relative 11/07/2016 1  % Final  . Basophils Absolute 11/07/2016 0.0  0 - 0.1 K/uL Final  . Magnesium 11/07/2016 2.0  1.7 - 2.4 mg/dL Final    Assessment:  Coden Brayon Bielefeld is a 66 y.o. 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  Work-up on 08/15/2016 revealed an IgM of 3808.  Serum viscosity was 2.5 (1.6-1.9).  Beta2-microglobulin was 2.5 (0.6-2.4).  Negative studies included:  hepatitis B surface antigen, hepatitis B  core antibody, hepatitis C antibody, LDH, and CRP.   Bone marrow biopsy on 08/23/2016 revealed a mature B-cell lymphoma with plasmacytic differentiation, predominantly paratrabecular infiltrate of lymphoplasmacytic infiltrate accounts for 10-15% of marrow space involvement.  Marrow was normocellular for age (50-60%) with trilineage hematopoiesis.  There was mild to moderate paratrabecular increase in reticulin fibers associated with lymphoplasmacytic infiltrate.  Congo red stain was negative for amyloid.  Storage iron was present.    Bone marrow flow cytometry revealed a monotypic B-cell population (0.6% of sample) with a non-specific immunophenotype.  There was abnormal/monotypic plasma cell population (< 0.1% of sample).  Cytogenetics were normal (46, XY).  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  Bone survey on 08/18/2016 revealed no evidence of bony metastatic disease.  There are no CRAB criteria (hypercalciumia, renal insufficiency, anemia or bone lesions).  Concern is raised for possible contribution of Waldenstrom's to decline in performance status (mentation).  Serum viscosity is mildly elevated.  He has no palpable adenopathy or hepatosplenomegaly.   SPEP has been monitored:  1.6 gm/dL on 07/09/2015, 2.4 on 07/24/2015, and 2.8 on 08/11/2016.  Kappa free light chains have been monitored:  56.65 (ratio of 5.27) on 07/24/2015 and 70.3 (ratio 6.28) on 08/08/2016.  Chest, abdomen and pelvic CT scan on 08/12/2015 revealed no radiolucent bone lesions. There was no mediastinal or hilar adenopathy.  The ascending aorta measured 4.2 cm in diameter.  Chest, abdomen, and pelvic CT scan on 09/12/2016 revealed no adenopathy or hepatosplenomegaly.  There was mild fusiform enlargement of the ascending aorta (4.2 x 4.1 cm).  There were numerous hepatic cysts, colonic diverticulosis, an enlarged prostate with partial bladder outlet obstruction, and bilateral inguinal  hernias.  Head MRI on 09/08/2016 revealed no acute or reversible findings.  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  There was mild to moderate cerebral atrophy.  EEG on 09/27/2016 revealed right temporal epileptiform discharge and left temporal focal slowing.  Paraneoplastic panel was negative.  He has not had an LP.  He has B12 deficiency on supplementation.  B12 was 154 on  07/24/2015 with an elevated MMA.  Folate was 31 and B12 was 246 (low normal) on 08/15/2016.  MMA was 226 (normal) on 08/30/2016.  He receives B12 monthly (last 09/27/2016).   Plan:  1.  Labs today:  CBC with diff, CMP, SPEP, IgM. 2.  Discuss treatment plan.  Continue sub-cutaneous Velcade today. 3.  Continue acyclovir for zoster prophylaxis.   4.  Continue Decadron, ondansetron and acyclovir as prescribed. 5.  Monthly B12 today. 6.  RTC on Thursday for further evaluation, and continuation of Velcade, an additional treatment planning. 7. Hypertension: Continue current treatment. Monitor.   Lloyd Huger, MD  11/09/2016, 3:38 PM

## 2016-11-07 ENCOUNTER — Other Ambulatory Visit: Payer: Self-pay | Admitting: Hematology and Oncology

## 2016-11-07 ENCOUNTER — Inpatient Hospital Stay: Payer: BLUE CROSS/BLUE SHIELD

## 2016-11-07 ENCOUNTER — Inpatient Hospital Stay (HOSPITAL_BASED_OUTPATIENT_CLINIC_OR_DEPARTMENT_OTHER): Payer: BLUE CROSS/BLUE SHIELD | Admitting: Oncology

## 2016-11-07 VITALS — BP 163/93 | HR 71 | Temp 98.2°F | Resp 18 | Wt 214.7 lb

## 2016-11-07 DIAGNOSIS — D472 Monoclonal gammopathy: Secondary | ICD-10-CM | POA: Diagnosis not present

## 2016-11-07 DIAGNOSIS — F0390 Unspecified dementia without behavioral disturbance: Secondary | ICD-10-CM

## 2016-11-07 DIAGNOSIS — C880 Waldenstrom macroglobulinemia: Secondary | ICD-10-CM

## 2016-11-07 DIAGNOSIS — E538 Deficiency of other specified B group vitamins: Secondary | ICD-10-CM

## 2016-11-07 DIAGNOSIS — H409 Unspecified glaucoma: Secondary | ICD-10-CM | POA: Diagnosis not present

## 2016-11-07 DIAGNOSIS — K5900 Constipation, unspecified: Secondary | ICD-10-CM

## 2016-11-07 DIAGNOSIS — Z8041 Family history of malignant neoplasm of ovary: Secondary | ICD-10-CM | POA: Diagnosis not present

## 2016-11-07 DIAGNOSIS — Z803 Family history of malignant neoplasm of breast: Secondary | ICD-10-CM | POA: Diagnosis not present

## 2016-11-07 DIAGNOSIS — Z79899 Other long term (current) drug therapy: Secondary | ICD-10-CM

## 2016-11-07 DIAGNOSIS — I10 Essential (primary) hypertension: Secondary | ICD-10-CM

## 2016-11-07 DIAGNOSIS — Z5111 Encounter for antineoplastic chemotherapy: Secondary | ICD-10-CM

## 2016-11-07 DIAGNOSIS — N400 Enlarged prostate without lower urinary tract symptoms: Secondary | ICD-10-CM

## 2016-11-07 LAB — CBC WITH DIFFERENTIAL/PLATELET
Basophils Absolute: 0 10*3/uL (ref 0–0.1)
Basophils Relative: 1 %
Eosinophils Absolute: 0.1 10*3/uL (ref 0–0.7)
Eosinophils Relative: 2 %
HCT: 37.2 % — ABNORMAL LOW (ref 40.0–52.0)
Hemoglobin: 12.6 g/dL — ABNORMAL LOW (ref 13.0–18.0)
Lymphocytes Relative: 23 %
Lymphs Abs: 1.8 10*3/uL (ref 1.0–3.6)
MCH: 31.6 pg (ref 26.0–34.0)
MCHC: 34 g/dL (ref 32.0–36.0)
MCV: 93.1 fL (ref 80.0–100.0)
Monocytes Absolute: 0.6 10*3/uL (ref 0.2–1.0)
Monocytes Relative: 8 %
Neutro Abs: 5.3 10*3/uL (ref 1.4–6.5)
Neutrophils Relative %: 66 %
Platelets: 252 10*3/uL (ref 150–440)
RBC: 3.99 MIL/uL — ABNORMAL LOW (ref 4.40–5.90)
RDW: 13.2 % (ref 11.5–14.5)
WBC: 7.9 10*3/uL (ref 3.8–10.6)

## 2016-11-07 LAB — COMPREHENSIVE METABOLIC PANEL
ALT: 11 U/L — ABNORMAL LOW (ref 17–63)
AST: 14 U/L — ABNORMAL LOW (ref 15–41)
Albumin: 3.5 g/dL (ref 3.5–5.0)
Alkaline Phosphatase: 54 U/L (ref 38–126)
Anion gap: 4 — ABNORMAL LOW (ref 5–15)
BUN: 17 mg/dL (ref 6–20)
CO2: 30 mmol/L (ref 22–32)
Calcium: 9.2 mg/dL (ref 8.9–10.3)
Chloride: 99 mmol/L — ABNORMAL LOW (ref 101–111)
Creatinine, Ser: 0.78 mg/dL (ref 0.61–1.24)
GFR calc Af Amer: >60 mL/min (ref >60)
GFR calc non Af Amer: >60 mL/min (ref >60)
Glucose, Bld: 247 mg/dL — ABNORMAL HIGH (ref 65–99)
Potassium: 3.6 mmol/L (ref 3.5–5.1)
Sodium: 133 mmol/L — ABNORMAL LOW (ref 135–145)
Total Bilirubin: 0.4 mg/dL (ref 0.3–1.2)
Total Protein: 7.8 g/dL (ref 6.5–8.1)

## 2016-11-07 LAB — MAGNESIUM: Magnesium: 2 mg/dL (ref 1.7–2.4)

## 2016-11-07 MED ORDER — BORTEZOMIB CHEMO SQ INJECTION 3.5 MG (2.5MG/ML)
1.3000 mg/m2 | Freq: Once | INTRAMUSCULAR | Status: AC
  Administered 2016-11-07: 2.75 mg via SUBCUTANEOUS
  Filled 2016-11-07: qty 2.75

## 2016-11-07 MED ORDER — ONDANSETRON HCL 4 MG PO TABS
8.0000 mg | ORAL_TABLET | Freq: Once | ORAL | Status: AC
  Administered 2016-11-07: 8 mg via ORAL
  Filled 2016-11-07: qty 2

## 2016-11-07 NOTE — Progress Notes (Signed)
Patient states he has problems urinating. Also having problems moving his bowels. States he does not sleep well and is not eating as well as he has been.  BP elevated  today 163/93 HR 71.  *Patient refused to allow registration to put on his ID bracelet.  Had patient show me the bracelet and give me his name and birthdate.

## 2016-11-08 ENCOUNTER — Ambulatory Visit: Payer: BLUE CROSS/BLUE SHIELD

## 2016-11-09 ENCOUNTER — Other Ambulatory Visit: Payer: Self-pay | Admitting: *Deleted

## 2016-11-09 DIAGNOSIS — N400 Enlarged prostate without lower urinary tract symptoms: Secondary | ICD-10-CM

## 2016-11-10 ENCOUNTER — Inpatient Hospital Stay: Payer: BLUE CROSS/BLUE SHIELD

## 2016-11-10 ENCOUNTER — Ambulatory Visit: Payer: BLUE CROSS/BLUE SHIELD

## 2016-11-10 ENCOUNTER — Other Ambulatory Visit: Payer: Self-pay | Admitting: Hematology and Oncology

## 2016-11-10 ENCOUNTER — Inpatient Hospital Stay (HOSPITAL_BASED_OUTPATIENT_CLINIC_OR_DEPARTMENT_OTHER): Payer: BLUE CROSS/BLUE SHIELD | Admitting: Hematology and Oncology

## 2016-11-10 VITALS — BP 124/84 | HR 73 | Temp 96.9°F | Resp 18 | Wt 218.7 lb

## 2016-11-10 DIAGNOSIS — C880 Waldenstrom macroglobulinemia: Secondary | ICD-10-CM

## 2016-11-10 DIAGNOSIS — I10 Essential (primary) hypertension: Secondary | ICD-10-CM

## 2016-11-10 DIAGNOSIS — Z5112 Encounter for antineoplastic immunotherapy: Secondary | ICD-10-CM

## 2016-11-10 DIAGNOSIS — Z8673 Personal history of transient ischemic attack (TIA), and cerebral infarction without residual deficits: Secondary | ICD-10-CM | POA: Diagnosis not present

## 2016-11-10 DIAGNOSIS — E538 Deficiency of other specified B group vitamins: Secondary | ICD-10-CM

## 2016-11-10 DIAGNOSIS — G4700 Insomnia, unspecified: Secondary | ICD-10-CM | POA: Diagnosis not present

## 2016-11-10 DIAGNOSIS — K5900 Constipation, unspecified: Secondary | ICD-10-CM | POA: Diagnosis not present

## 2016-11-10 DIAGNOSIS — Z803 Family history of malignant neoplasm of breast: Secondary | ICD-10-CM

## 2016-11-10 DIAGNOSIS — H409 Unspecified glaucoma: Secondary | ICD-10-CM | POA: Diagnosis not present

## 2016-11-10 DIAGNOSIS — Z8041 Family history of malignant neoplasm of ovary: Secondary | ICD-10-CM

## 2016-11-10 DIAGNOSIS — N400 Enlarged prostate without lower urinary tract symptoms: Secondary | ICD-10-CM

## 2016-11-10 DIAGNOSIS — F0390 Unspecified dementia without behavioral disturbance: Secondary | ICD-10-CM

## 2016-11-10 DIAGNOSIS — R0789 Other chest pain: Secondary | ICD-10-CM

## 2016-11-10 DIAGNOSIS — Z79899 Other long term (current) drug therapy: Secondary | ICD-10-CM | POA: Diagnosis not present

## 2016-11-10 DIAGNOSIS — D472 Monoclonal gammopathy: Secondary | ICD-10-CM

## 2016-11-10 DIAGNOSIS — Z5111 Encounter for antineoplastic chemotherapy: Secondary | ICD-10-CM

## 2016-11-10 LAB — COMPREHENSIVE METABOLIC PANEL
ALT: 11 U/L — ABNORMAL LOW (ref 17–63)
AST: 16 U/L (ref 15–41)
Albumin: 3.5 g/dL (ref 3.5–5.0)
Alkaline Phosphatase: 54 U/L (ref 38–126)
Anion gap: 10 (ref 5–15)
BUN: 20 mg/dL (ref 6–20)
CO2: 29 mmol/L (ref 22–32)
Calcium: 9.1 mg/dL (ref 8.9–10.3)
Chloride: 95 mmol/L — ABNORMAL LOW (ref 101–111)
Creatinine, Ser: 0.83 mg/dL (ref 0.61–1.24)
GFR calc Af Amer: >60 mL/min (ref >60)
GFR calc non Af Amer: >60 mL/min (ref >60)
Glucose, Bld: 303 mg/dL — ABNORMAL HIGH (ref 65–99)
Potassium: 3.6 mmol/L (ref 3.5–5.1)
Sodium: 134 mmol/L — ABNORMAL LOW (ref 135–145)
Total Bilirubin: 0.5 mg/dL (ref 0.3–1.2)
Total Protein: 7.7 g/dL (ref 6.5–8.1)

## 2016-11-10 LAB — URIC ACID: Uric Acid, Serum: 3.8 mg/dL — ABNORMAL LOW (ref 4.4–7.6)

## 2016-11-10 LAB — CBC WITH DIFFERENTIAL/PLATELET
Basophils Absolute: 0.1 10*3/uL (ref 0–0.1)
Basophils Relative: 1 %
Eosinophils Absolute: 0.1 10*3/uL (ref 0–0.7)
Eosinophils Relative: 1 %
HCT: 39 % — ABNORMAL LOW (ref 40.0–52.0)
Hemoglobin: 13.2 g/dL (ref 13.0–18.0)
Lymphocytes Relative: 18 %
Lymphs Abs: 2 10*3/uL (ref 1.0–3.6)
MCH: 31.7 pg (ref 26.0–34.0)
MCHC: 33.7 g/dL (ref 32.0–36.0)
MCV: 93.9 fL (ref 80.0–100.0)
Monocytes Absolute: 1.1 10*3/uL — ABNORMAL HIGH (ref 0.2–1.0)
Monocytes Relative: 10 %
Neutro Abs: 8.1 10*3/uL — ABNORMAL HIGH (ref 1.4–6.5)
Neutrophils Relative %: 70 %
Platelets: 243 10*3/uL (ref 150–440)
RBC: 4.15 MIL/uL — ABNORMAL LOW (ref 4.40–5.90)
RDW: 13.5 % (ref 11.5–14.5)
WBC: 11.3 10*3/uL — ABNORMAL HIGH (ref 3.8–10.6)

## 2016-11-10 MED ORDER — BORTEZOMIB CHEMO SQ INJECTION 3.5 MG (2.5MG/ML)
1.3000 mg/m2 | Freq: Once | INTRAMUSCULAR | Status: AC
  Administered 2016-11-10: 2.75 mg via SUBCUTANEOUS
  Filled 2016-11-10: qty 2.75

## 2016-11-10 MED ORDER — DIPHENHYDRAMINE HCL 50 MG/ML IJ SOLN
50.0000 mg | Freq: Once | INTRAMUSCULAR | Status: AC
  Administered 2016-11-10: 50 mg via INTRAVENOUS
  Filled 2016-11-10: qty 1

## 2016-11-10 MED ORDER — ACETAMINOPHEN 325 MG PO TABS
650.0000 mg | ORAL_TABLET | Freq: Once | ORAL | Status: AC
  Administered 2016-11-10: 650 mg via ORAL
  Filled 2016-11-10: qty 2

## 2016-11-10 MED ORDER — SODIUM CHLORIDE 0.9 % IV SOLN
Freq: Once | INTRAVENOUS | Status: AC
  Administered 2016-11-10: 12:00:00 via INTRAVENOUS
  Filled 2016-11-10: qty 1000

## 2016-11-10 MED ORDER — SODIUM CHLORIDE 0.9 % IV SOLN
375.0000 mg/m2 | Freq: Once | INTRAVENOUS | Status: AC
  Administered 2016-11-10: 800 mg via INTRAVENOUS
  Filled 2016-11-10: qty 50

## 2016-11-10 MED ORDER — ONDANSETRON HCL 4 MG PO TABS
8.0000 mg | ORAL_TABLET | Freq: Once | ORAL | Status: AC
  Administered 2016-11-10: 8 mg via ORAL
  Filled 2016-11-10: qty 2

## 2016-11-10 NOTE — Progress Notes (Signed)
Patient states for past 2 weeks he has had tightness in his mid chest, made worse with movement.  Otherwise no complaints.

## 2016-11-10 NOTE — Progress Notes (Signed)
[facility] day:  11/10/2016  Chief Complaint: Adrian Valdez is a 66 y.o. male with Waldenstrom's macroglobulinemia who is seen for assessment prior day 11 of cycle #1 Velcade, Decadron, and Rituxan.  HPI:  The patient was last seen in the medical oncology clinic by me on 10/31/2016.  At that time, he received day 1 Velcade and Decadron 10 mg IV.  He tolerated treatment well.  SPEP revealed a 2.0 gm/dL monoclonal spike.  IgM level was 3531.  He received day 4 on 11/03/2016 with oral Decdaron 20 mg.  He saw Dr. Grayland Ormond in my absence for day 8 treatment on 11/07/2016. He received SQ Velcade.  He took oral Decadron at home.  During the interim, he has noticed some chest tightness, worse with movement.  He denies any shortness of breath.  He denies any side effects with his oral Decadon.   Past Medical History:  Diagnosis Date  . 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 B12 deficiency 07/27/2015  . Dementia   . Enlarged prostate   . Glaucoma   . Glaucoma   . Hypertension   . Urinary frequency   . Vitamin B 12 deficiency     Past Surgical History:  Procedure Laterality Date  . KNEE ARTHROSCOPY Right   . LASIK      Family History  Problem Relation Age of Onset  . Hypertension Mother   . Hypertension Father   . CAD Father   . Breast cancer Maternal Grandmother   . Ovarian cancer Paternal Grandmother   . Kidney disease Neg Hx   . Prostate cancer Neg Hx     Social History:  reports that he has never smoked. He has never used smokeless tobacco. He reports that he drinks alcohol. He reports that he does not use drugs.  He notes that he has 6 PhDs in fine arts, music, etc from the [employer], [place], and [employer].  He lives with his parents.  The patient is accompanied by his his sister-in-law, Tye [place], today.   Allergies:  Allergies  Allergen Reactions  . Oxybutynin Other (See Comments)    Headache   . Myrbetriq [Mirabegron] Other (See Comments)    Other reaction(s): Other (See Comments) migraine migraine     Current Medications: Current Outpatient Prescriptions  Medication Sig Dispense Refill  . acyclovir (ZOVIRAX) 400 MG tablet Take 1 tablet (400 mg total) by mouth 2 (two) times daily. 60 tablet 3  . dexamethasone (DECADRON) 4 MG tablet Take 5 tablets in the morning prior to chemotherapy (day 1,4,8, and 11). 20 tablet 3  . donepezil (ARICEPT) 5 MG tablet Take 5 mg by mouth at bedtime.     Marland Kitchen erythromycin ophthalmic ointment 1 application at bedtime.    . finasteride (PROSCAR) 5 MG tablet Take 5 mg by mouth daily. Reported on 11/30/2015    . lisinopril (PRINIVIL,ZESTRIL) 10 MG tablet Take by mouth. Reported on 11/30/2015    . methazolamide (NEPTAZANE) 50 MG tablet TK 1 T PO BID  1  . ondansetron (ZOFRAN) 8 MG tablet Take 1 tablet (8 mg total) by mouth 2 (two) times daily as needed (Nausea or vomiting). 30 tablet 1  . tamsulosin (FLOMAX) 0.4 MG CAPS capsule TAKE ONE CAPSULE BY MOUTH ONCE DAILY, 30 MINUTES AFTER THE SAME MEAL EACH DAY    . timolol (BETIMOL) 0.5 % ophthalmic solution Apply to eye. Reported on 11/30/2015    . Travoprost, BAK  Free, (TRAVATAN) 0.004 % SOLN ophthalmic solution 1 drop at bedtime.     No current facility-administered medications for this visit.     Review of Systems:  GENERAL:  Feels "ok".  No fevers or sweats.  Weight up 4 pounds. PERFORMANCE STATUS (ECOG):  2 HEENT:  Vision foggy secondary to glaucoma.  No runny nose, sore throat, mouth sores or tenderness. Lungs: No shortness of breath or cough.  Some chest tightness, exacerbated by movement.  No hemoptysis. Cardiac:  No chest pain, palpitations, orthopnea, or PND. GI:  Appetite ok.  No  nausea, vomiting, diarrhea, constipation, melena or hematochezia. GU:  Enlarged prostate.  No urgency, frequency, dysuria, or hematuria. Musculoskeletal:  No back pain.  Right knee issues.  No muscle  tenderness. Extremities:  No pain or swelling. Skin:  No rashes or skin changes. Neuro:  Trouble with thinking.  No headache, numbness or weakness, balance or coordination issues. Endocrine:  No diabetes, thyroid issues, hot flashes or night sweats. Psych:  Poor sleep.  No mood changes, depression or anxiety. Pain:  No focal pain. Review of systems:  All other systems reviewed and found to be negative.  Physical Exam: BP 124/84 (BP Location: Left Arm, Patient Position: Sitting)   Pulse 73   Temp (!) 96.9 F (36.1 C)   Resp 18   Wt 218 lb 11.1 oz (99.2 kg)   BMI 30.50 kg/m   GENERAL:  Well developed, well nourished, gentleman sitting in the exam room. MENTAL STATUS:  Alert and oriented to person and place. Unsure of date today.  HEAD:  Pearline Cables hair.  Male pattern baldness.  Normocephalic, atraumatic, face symmetric, no Cushingoid features. EYES: Pupils equal round and reactive to light and accomodation.  No conjunctivitis or scleral icterus. ENT:  Oropharynx clear without lesion.  Tongue normal. Mucous membranes moist.  RESPIRATORY:  Clear to auscultation without rales, wheezes or rhonchi. CARDIOVASCULAR:  Regular rate and rhythm without murmur, rub or gallop. ABDOMEN:  Soft, non-tender, with active bowel sounds, and no hepatosplenomegaly.  No masses. SKIN:  No rashes, ulcers or lesions. EXTREMITIES: No edema, no skin discoloration or tenderness.  No palpable cords. LYMPH NODES: No palpable cervical, supraclavicular, axillary or inguinal adenopathy  NEUROLOGICAL:  No change. PSYCH:  Flat affect. Conversant.   Appointment on 11/10/2016  Component Date Value Ref Range Status  . WBC 11/10/2016 11.3* 3.8 - 10.6 K/uL Final  . RBC 11/10/2016 4.15* 4.40 - 5.90 MIL/uL Final  . Hemoglobin 11/10/2016 13.2  13.0 - 18.0 g/dL Final  . HCT 11/10/2016 39.0* 40.0 - 52.0 % Final  . MCV 11/10/2016 93.9  80.0 - 100.0 fL Final  . MCH 11/10/2016 31.7  26.0 - 34.0 pg Final  . MCHC 11/10/2016 33.7   32.0 - 36.0 g/dL Final  . RDW 11/10/2016 13.5  11.5 - 14.5 % Final  . Platelets 11/10/2016 243  150 - 440 K/uL Final  . Neutrophils Relative % 11/10/2016 70  % Final  . Neutro Abs 11/10/2016 8.1* 1.4 - 6.5 K/uL Final  . Lymphocytes Relative 11/10/2016 18  % Final  . Lymphs Abs 11/10/2016 2.0  1.0 - 3.6 K/uL Final  . Monocytes Relative 11/10/2016 10  % Final  . Monocytes Absolute 11/10/2016 1.1* 0.2 - 1.0 K/uL Final  . Eosinophils Relative 11/10/2016 1  % Final  . Eosinophils Absolute 11/10/2016 0.1  0 - 0.7 K/uL Final  . Basophils Relative 11/10/2016 1  % Final  . Basophils Absolute 11/10/2016 0.1  0 - 0.1  K/uL Final  . Sodium 11/10/2016 134* 135 - 145 mmol/L Final  . Potassium 11/10/2016 3.6  3.5 - 5.1 mmol/L Final  . Chloride 11/10/2016 95* 101 - 111 mmol/L Final  . CO2 11/10/2016 29  22 - 32 mmol/L Final  . Glucose, Bld 11/10/2016 303* 65 - 99 mg/dL Final  . BUN 11/10/2016 20  6 - 20 mg/dL Final  . Creatinine, Ser 11/10/2016 0.83  0.61 - 1.24 mg/dL Final  . Calcium 11/10/2016 9.1  8.9 - 10.3 mg/dL Final  . Total Protein 11/10/2016 7.7  6.5 - 8.1 g/dL Final  . Albumin 11/10/2016 3.5  3.5 - 5.0 g/dL Final  . AST 11/10/2016 16  15 - 41 U/L Final  . ALT 11/10/2016 11* 17 - 63 U/L Final  . Alkaline Phosphatase 11/10/2016 54  38 - 126 U/L Final  . Total Bilirubin 11/10/2016 0.5  0.3 - 1.2 mg/dL Final  . GFR calc non Af Amer 11/10/2016 >60  >60 mL/min Final  . GFR calc Af Amer 11/10/2016 >60  >60 mL/min Final   Comment: (NOTE) The eGFR has been calculated using the CKD EPI equation. This calculation has not been validated in all clinical situations. eGFR's persistently <60 mL/min signify possible Chronic Kidney Disease.   . Anion gap 11/10/2016 10  5 - 15 Final  . Uric Acid, Serum 11/10/2016 3.8* 4.4 - 7.6 mg/dL Final    Assessment:  Adrian Valdez is a 66 y.o. 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  He was diagnosed with a monoclonal  IgM gammopathy with kappa light chain specificity diagnosed in 07/2015.  Work-up on 08/15/2016 revealed an IgM of 3808.  Serum viscosity was 2.5 (1.6-1.9).  Beta2-microglobulin was 2.5 (0.6-2.4).  Negative studies included:  hepatitis B surface antigen, hepatitis B core antibody, hepatitis C antibody, LDH, and CRP.   Bone marrow biopsy on 08/23/2016 revealed a mature B-cell lymphoma with plasmacytic differentiation, predominantly paratrabecular infiltrate of lymphoplasmacytic infiltrate accounts for 10-15% of marrow space involvement.  Marrow was normocellular for age (50-60%) with trilineage hematopoiesis.  There was mild to moderate paratrabecular increase in reticulin fibers associated with lymphoplasmacytic infiltrate.  Congo red stain was negative for amyloid.  Storage iron was present.    Bone marrow flow cytometry revealed a monotypic B-cell population (0.6% of sample) with a non-specific immunophenotype.  There was abnormal/monotypic plasma cell population (< 0.1% of sample).  Cytogenetics were normal (46, XY).  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  Bone survey on 08/18/2016 revealed no evidence of bony metastatic disease.  There are no CRAB criteria (hypercalciumia, renal insufficiency, anemia or bone lesions).  Concern is raised for possible contribution of Waldenstrom's to decline in performance status (mentation).  Serum viscosity is mildly elevated.  He has no palpable adenopathy or hepatosplenomegaly.   SPEP has been monitored:  1.6 gm/dL on 07/09/2015, 2.4 on 07/24/2015, 2.8 on 08/11/2016, and 2.0 on 10/31/2016.  IgM has been monitored:  3808 on 08/15/2016 and 3531 on 10/25/2016.  Kappa free light chains have been monitored:  56.65 (ratio of 5.27) on 07/24/2015 and 70.3 (ratio 6.28) on 08/08/2016.  Chest, abdomen and pelvic CT scan on 08/12/2015 revealed no radiolucent bone lesions. There was no mediastinal or hilar adenopathy.  The ascending aorta  measured 4.2 cm in diameter.  Chest, abdomen, and pelvic CT scan on 09/12/2016 revealed no adenopathy or hepatosplenomegaly.  There was mild fusiform enlargement of the ascending aorta (4.2 x 4.1 cm).  There were numerous hepatic  cysts, colonic diverticulosis, an enlarged prostate with partial bladder outlet obstruction, and bilateral inguinal hernias.  Head MRI on 09/08/2016 revealed no acute or reversible findings.  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  There was mild to moderate cerebral atrophy.  EEG on 09/27/2016 revealed right temporal epileptiform discharge and left temporal focal slowing.  Paraneoplastic panel was negative.  He has not had an LP.  He has B12 deficiency on supplementation.  B12 was 154 on 07/24/2015 with an elevated MMA.  Folate was 31 and B12 was 246 (low normal) on 08/15/2016.  MMA was 226 (normal) on 08/30/2016.  He receives B12 monthly (last 10/31/2016).  He is day 11 of cycle #1 Velcade, Decadron, and Rituxan (began 10/31/2016).  He is tolerating treatment well.  Symptomatically, he notes some chest tightness exacerbated by movement.  Exam reveals no adenopathy or hepatosplenomegaly.  Plan: 1.  Labs today:  CBC with diff, CMP, uric acid. 2.  Review chemotherapy plan today.  Patient has received Velcade and Decadron.  Discuss 1st infusion of Rituxan today.  Potential side effects reviewed.  Discussed possibility of IgM flare (increased IgM) with increased viscosity.  On protocol, patients underwent prophylactic plasmapheresis if IgM >= 5000.  Level is 3531.  Review signs and symptoms of hyperviscosity and plan to call if any concerns. 3.  Discuss symptoms of chest tightness.  No shortness of breath or chest pain.  Patient minimizes symptoms.  Discuss follow-up in clinic or ER if concerning symptoms. 4.  Discuss hyperglycemia.  Phone follow-up with Dr. Ouida Sills today. 5.  Day 11 today (Velcade and Rituxan).  Patient took Decadron at home. 6.   Continue acyclovir prophylaxis. 7.  Continue monthly B12 (next due 11/28/2016). 8.  RTC on 11/21/2016 for MD assessment, labs (CBC with diff, CMP, Mg, SPEP, IgM), and day 1 of cycle #2 Velcade, Decadron, and Rituxan.   Lequita Asal, MD  11/10/2016, 10:31 AM

## 2016-11-11 ENCOUNTER — Ambulatory Visit: Payer: BLUE CROSS/BLUE SHIELD

## 2016-11-20 ENCOUNTER — Encounter: Payer: Self-pay | Admitting: Hematology and Oncology

## 2016-11-20 DIAGNOSIS — Z5112 Encounter for antineoplastic immunotherapy: Secondary | ICD-10-CM

## 2016-11-20 DIAGNOSIS — Z7189 Other specified counseling: Secondary | ICD-10-CM | POA: Insufficient documentation

## 2016-11-21 ENCOUNTER — Encounter: Payer: Self-pay | Admitting: Hematology and Oncology

## 2016-11-21 ENCOUNTER — Inpatient Hospital Stay (HOSPITAL_BASED_OUTPATIENT_CLINIC_OR_DEPARTMENT_OTHER): Payer: BLUE CROSS/BLUE SHIELD | Admitting: Hematology and Oncology

## 2016-11-21 ENCOUNTER — Inpatient Hospital Stay: Payer: BLUE CROSS/BLUE SHIELD

## 2016-11-21 ENCOUNTER — Other Ambulatory Visit: Payer: Self-pay | Admitting: Hematology and Oncology

## 2016-11-21 ENCOUNTER — Other Ambulatory Visit: Payer: Self-pay | Admitting: *Deleted

## 2016-11-21 VITALS — BP 171/96 | HR 76 | Temp 98.2°F | Resp 18 | Ht 71.0 in | Wt 213.8 lb

## 2016-11-21 DIAGNOSIS — H409 Unspecified glaucoma: Secondary | ICD-10-CM

## 2016-11-21 DIAGNOSIS — N400 Enlarged prostate without lower urinary tract symptoms: Secondary | ICD-10-CM

## 2016-11-21 DIAGNOSIS — E538 Deficiency of other specified B group vitamins: Secondary | ICD-10-CM

## 2016-11-21 DIAGNOSIS — R634 Abnormal weight loss: Secondary | ICD-10-CM | POA: Diagnosis not present

## 2016-11-21 DIAGNOSIS — C880 Waldenstrom macroglobulinemia: Secondary | ICD-10-CM

## 2016-11-21 DIAGNOSIS — I10 Essential (primary) hypertension: Secondary | ICD-10-CM

## 2016-11-21 DIAGNOSIS — D472 Monoclonal gammopathy: Secondary | ICD-10-CM

## 2016-11-21 DIAGNOSIS — Z79899 Other long term (current) drug therapy: Secondary | ICD-10-CM | POA: Diagnosis not present

## 2016-11-21 DIAGNOSIS — B370 Candidal stomatitis: Secondary | ICD-10-CM

## 2016-11-21 DIAGNOSIS — G4700 Insomnia, unspecified: Secondary | ICD-10-CM | POA: Diagnosis not present

## 2016-11-21 DIAGNOSIS — F0390 Unspecified dementia without behavioral disturbance: Secondary | ICD-10-CM | POA: Diagnosis not present

## 2016-11-21 DIAGNOSIS — Z5111 Encounter for antineoplastic chemotherapy: Secondary | ICD-10-CM

## 2016-11-21 DIAGNOSIS — Z8673 Personal history of transient ischemic attack (TIA), and cerebral infarction without residual deficits: Secondary | ICD-10-CM

## 2016-11-21 DIAGNOSIS — R739 Hyperglycemia, unspecified: Secondary | ICD-10-CM | POA: Diagnosis not present

## 2016-11-21 LAB — COMPREHENSIVE METABOLIC PANEL
ALT: 11 U/L — ABNORMAL LOW (ref 17–63)
AST: 16 U/L (ref 15–41)
Albumin: 3.5 g/dL (ref 3.5–5.0)
Alkaline Phosphatase: 54 U/L (ref 38–126)
Anion gap: 6 (ref 5–15)
BUN: 16 mg/dL (ref 6–20)
CO2: 28 mmol/L (ref 22–32)
Calcium: 9.1 mg/dL (ref 8.9–10.3)
Chloride: 101 mmol/L (ref 101–111)
Creatinine, Ser: 0.9 mg/dL (ref 0.61–1.24)
GFR calc Af Amer: >60 mL/min (ref >60)
GFR calc non Af Amer: >60 mL/min (ref >60)
Glucose, Bld: 217 mg/dL — ABNORMAL HIGH (ref 65–99)
Potassium: 3.7 mmol/L (ref 3.5–5.1)
Sodium: 135 mmol/L (ref 135–145)
Total Bilirubin: 0.5 mg/dL (ref 0.3–1.2)
Total Protein: 7.8 g/dL (ref 6.5–8.1)

## 2016-11-21 LAB — CBC WITH DIFFERENTIAL/PLATELET
Basophils Absolute: 0 10*3/uL (ref 0–0.1)
Basophils Relative: 1 %
Eosinophils Absolute: 0.1 10*3/uL (ref 0–0.7)
Eosinophils Relative: 2 %
HCT: 37.9 % — ABNORMAL LOW (ref 40.0–52.0)
Hemoglobin: 13 g/dL (ref 13.0–18.0)
Lymphocytes Relative: 19 %
Lymphs Abs: 1.1 10*3/uL (ref 1.0–3.6)
MCH: 31.9 pg (ref 26.0–34.0)
MCHC: 34.4 g/dL (ref 32.0–36.0)
MCV: 92.7 fL (ref 80.0–100.0)
Monocytes Absolute: 0.4 10*3/uL (ref 0.2–1.0)
Monocytes Relative: 8 %
Neutro Abs: 4.1 10*3/uL (ref 1.4–6.5)
Neutrophils Relative %: 70 %
Platelets: 313 10*3/uL (ref 150–440)
RBC: 4.09 MIL/uL — ABNORMAL LOW (ref 4.40–5.90)
RDW: 13 % (ref 11.5–14.5)
WBC: 5.9 10*3/uL (ref 3.8–10.6)

## 2016-11-21 LAB — MAGNESIUM: Magnesium: 1.9 mg/dL (ref 1.7–2.4)

## 2016-11-21 MED ORDER — NYSTATIN 100000 UNIT/ML MT SUSP: 5.0000 mL | Freq: Four times a day (QID) | OROMUCOSAL | 1 refills | Status: DC

## 2016-11-21 MED ORDER — BORTEZOMIB CHEMO SQ INJECTION 3.5 MG (2.5MG/ML)
1.3000 mg/m2 | Freq: Once | INTRAMUSCULAR | Status: AC
  Administered 2016-11-21: 2.75 mg via SUBCUTANEOUS
  Filled 2016-11-21: qty 2.75

## 2016-11-21 MED ORDER — ONDANSETRON HCL 4 MG PO TABS
8.0000 mg | ORAL_TABLET | Freq: Once | ORAL | Status: AC
  Administered 2016-11-21: 8 mg via ORAL
  Filled 2016-11-21: qty 2

## 2016-11-21 NOTE — Progress Notes (Signed)
[facility] day:  11/21/2016  Chief Complaint: Adrian Valdez is a 66 y.o. male with Waldenstrom's macroglobulinemia who is seen for assessment prior day 1 of cycle #2 Velcade, Decadron, and Rituxan.  HPI:  The patient was last seen in the medical oncology clinic on 11/10/2016.  At that time, he received day 11 of cycle #1 Velcade, Decadron, and Rituxan.  He tolerated his first Rituxan infusion well.  He notes "no difference" in how he feels.  He denies any side effects of treatment.  He has had issues with his blood sugar (elevated).  He is on a "medication for blood sugar".  He takes his Decadron prior to treatment days.  He is taking acyclovir prophylaxis.   Past Medical History:  Diagnosis Date  . 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 B12 deficiency 07/27/2015  . Dementia   . Enlarged prostate   . Glaucoma   . Glaucoma   . Hypertension   . Urinary frequency   . Vitamin B 12 deficiency     Past Surgical History:  Procedure Laterality Date  . KNEE ARTHROSCOPY Right   . LASIK      Family History  Problem Relation Age of Onset  . Hypertension Mother   . Hypertension Father   . CAD Father   . Breast cancer Maternal Grandmother   . Ovarian cancer Paternal Grandmother   . Kidney disease Neg Hx   . Prostate cancer Neg Hx     Social History:  reports that he has never smoked. He has never used smokeless tobacco. He reports that he drinks alcohol. He reports that he does not use drugs.  He notes that he has 6 PhDs in fine arts, music, etc from the [employer] and [employer].  He lives with his parents.  The patient is accompanied by his his father today.   Allergies:  Allergies  Allergen Reactions  . Oxybutynin Other (See Comments)    Headache  . Myrbetriq [Mirabegron] Other (See Comments)    Other reaction(s): Other (See Comments) migraine migraine     Current  Medications: Current Outpatient Prescriptions  Medication Sig Dispense Refill  . acyclovir (ZOVIRAX) 400 MG tablet Take 1 tablet (400 mg total) by mouth 2 (two) times daily. 60 tablet 3  . dexamethasone (DECADRON) 4 MG tablet Take 5 tablets in the morning prior to chemotherapy (day 1,4,8, and 11). 20 tablet 3  . erythromycin ophthalmic ointment 1 application at bedtime.    . finasteride (PROSCAR) 5 MG tablet Take 5 mg by mouth daily. Reported on 11/30/2015    . lisinopril (PRINIVIL,ZESTRIL) 10 MG tablet Take by mouth. Reported on 11/30/2015    . methazolamide (NEPTAZANE) 50 MG tablet TK 1 T PO BID  1  . ondansetron (ZOFRAN) 8 MG tablet Take 1 tablet (8 mg total) by mouth 2 (two) times daily as needed (Nausea or vomiting). 30 tablet 1  . tamsulosin (FLOMAX) 0.4 MG CAPS capsule TAKE ONE CAPSULE BY MOUTH ONCE DAILY, 30 MINUTES AFTER THE SAME MEAL EACH DAY    . timolol (BETIMOL) 0.5 % ophthalmic solution Apply to eye. Reported on 11/30/2015    . Travoprost, BAK Free, (TRAVATAN) 0.004 % SOLN ophthalmic solution 1 drop at bedtime.    . donepezil (ARICEPT) 5 MG tablet Take 5 mg by mouth at bedtime.      No current facility-administered medications for this visit.     Review of  Systems:  GENERAL:  Feels "the same".  No fevers or sweats.  Weight down 5 pounds. PERFORMANCE STATUS (ECOG):  2 HEENT:  Vision foggy secondary to glaucoma.  No runny nose, sore throat, mouth sores or tenderness. Lungs: No shortness of breath or cough.  No hemoptysis. Cardiac:  No chest pain, palpitations, orthopnea, or PND. GI:  Appetite "the same".  No  nausea, vomiting, diarrhea, constipation, melena or hematochezia. GU:  Enlarged prostate.  No urgency, frequency, dysuria, or hematuria. Musculoskeletal:  No back pain.  Right knee issues.  No muscle tenderness. Extremities:  No pain or swelling. Skin:  No rashes or skin changes. Neuro:  Trouble with thinking.  No headache, numbness or weakness, balance or coordination  issues. Endocrine:  Elevated blood sugar.  No thyroid issues, hot flashes or night sweats. Psych:  Poor sleep.  No mood changes, depression or anxiety. Pain:  No focal pain. Review of systems:  All other systems reviewed and found to be negative.  Physical Exam: BP (!) 171/96   Pulse 76   Temp 98.2 F (36.8 C) (Tympanic)   Resp 18   Ht _0  (1.803 m)   Wt 213 lb 13.5 oz (97 kg)   BMI 29.83 kg/m   GENERAL:  Well developed, well nourished, gentleman sitting comfortably in the exam room. MENTAL STATUS:  Alert and oriented to person and place. Unsure of date today.  HEAD:  Pearline Cables hair.  Male pattern baldness.  Normocephalic, atraumatic, face symmetric, no Cushingoid features. EYES: Pupils equal round and reactive to light and accomodation.  No conjunctivitis or scleral icterus. ENT:  Buccal mucosa thrush.  Tongue normal. Mucous membranes moist.  RESPIRATORY:  Clear to auscultation without rales, wheezes or rhonchi. CARDIOVASCULAR:  Regular rate and rhythm without murmur, rub or gallop. ABDOMEN:  Soft, non-tender, with active bowel sounds, and no hepatosplenomegaly.  No masses. SKIN:  No rashes, ulcers or lesions. EXTREMITIES: No edema, no skin discoloration or tenderness.  No palpable cords. LYMPH NODES: No palpable cervical, supraclavicular, axillary or inguinal adenopathy  NEUROLOGICAL:  No change. PSYCH:  Flat affect. Conversant.   Appointment on 11/21/2016  Component Date Value Ref Range Status  . WBC 11/21/2016 5.9  3.8 - 10.6 K/uL Final  . RBC 11/21/2016 4.09* 4.40 - 5.90 MIL/uL Final  . Hemoglobin 11/21/2016 13.0  13.0 - 18.0 g/dL Final  . HCT 11/21/2016 37.9* 40.0 - 52.0 % Final  . MCV 11/21/2016 92.7  80.0 - 100.0 fL Final  . MCH 11/21/2016 31.9  26.0 - 34.0 pg Final  . MCHC 11/21/2016 34.4  32.0 - 36.0 g/dL Final  . RDW 11/21/2016 13.0  11.5 - 14.5 % Final  . Platelets 11/21/2016 313  150 - 440 K/uL Final  . Neutrophils Relative % 11/21/2016 70  % Final  . Neutro Abs  11/21/2016 4.1  1.4 - 6.5 K/uL Final  . Lymphocytes Relative 11/21/2016 19  % Final  . Lymphs Abs 11/21/2016 1.1  1.0 - 3.6 K/uL Final  . Monocytes Relative 11/21/2016 8  % Final  . Monocytes Absolute 11/21/2016 0.4  0.2 - 1.0 K/uL Final  . Eosinophils Relative 11/21/2016 2  % Final  . Eosinophils Absolute 11/21/2016 0.1  0 - 0.7 K/uL Final  . Basophils Relative 11/21/2016 1  % Final  . Basophils Absolute 11/21/2016 0.0  0 - 0.1 K/uL Final  . Sodium 11/21/2016 135  135 - 145 mmol/L Final  . Potassium 11/21/2016 3.7  3.5 - 5.1 mmol/L Final  .  Chloride 11/21/2016 101  101 - 111 mmol/L Final  . CO2 11/21/2016 28  22 - 32 mmol/L Final  . Glucose, Bld 11/21/2016 217* 65 - 99 mg/dL Final  . BUN 11/21/2016 16  6 - 20 mg/dL Final  . Creatinine, Ser 11/21/2016 0.90  0.61 - 1.24 mg/dL Final  . Calcium 11/21/2016 9.1  8.9 - 10.3 mg/dL Final  . Total Protein 11/21/2016 7.8  6.5 - 8.1 g/dL Final  . Albumin 11/21/2016 3.5  3.5 - 5.0 g/dL Final  . AST 11/21/2016 16  15 - 41 U/L Final  . ALT 11/21/2016 11* 17 - 63 U/L Final  . Alkaline Phosphatase 11/21/2016 54  38 - 126 U/L Final  . Total Bilirubin 11/21/2016 0.5  0.3 - 1.2 mg/dL Final  . GFR calc non Af Amer 11/21/2016 >60  >60 mL/min Final  . GFR calc Af Amer 11/21/2016 >60  >60 mL/min Final   Comment: (NOTE) The eGFR has been calculated using the CKD EPI equation. This calculation has not been validated in all clinical situations. eGFR's persistently <60 mL/min signify possible Chronic Kidney Disease.   . Anion gap 11/21/2016 6  5 - 15 Final  . Magnesium 11/21/2016 1.9  1.7 - 2.4 mg/dL Final    Assessment:  Adrian Valdez is a 66 y.o. 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  Work-up on 08/15/2016 revealed an IgM of 3808.  Serum viscosity was 2.5 (1.6-1.9).  Beta2-microglobulin was 2.5  (0.6-2.4).  Negative studies included:  hepatitis B surface antigen, hepatitis B core antibody, hepatitis C antibody, LDH, and CRP.   Bone marrow biopsy on 08/23/2016 revealed a mature B-cell lymphoma with plasmacytic differentiation, predominantly paratrabecular infiltrate of lymphoplasmacytic infiltrate accounts for 10-15% of marrow space involvement.  Marrow was normocellular for age (50-60%) with trilineage hematopoiesis.  There was mild to moderate paratrabecular increase in reticulin fibers associated with lymphoplasmacytic infiltrate.  Congo red stain was negative for amyloid.  Storage iron was present.    Bone marrow flow cytometry revealed a monotypic B-cell population (0.6% of sample) with a non-specific immunophenotype.  There was abnormal/monotypic plasma cell population (< 0.1% of sample).  Cytogenetics were normal (46, XY).  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  Bone survey on 08/18/2016 revealed no evidence of bony metastatic disease.  There are no CRAB criteria (hypercalciumia, renal insufficiency, anemia or bone lesions).  Concern is raised for possible contribution of Waldenstrom's to decline in performance status (mentation).  Serum viscosity is mildly elevated.  He has no palpable adenopathy or hepatosplenomegaly.   SPEP has been monitored:  1.6 gm/dL on 07/09/2015, 2.4 on 07/24/2015, 2.8 on 08/11/2016, 2.0 on 10/31/2016, and 1.8 on 11/21/2016.  IgM has been monitored:  3808 on 08/15/2016, 3531 on 10/25/2016, and 2757 on 11/21/2016.  Kappa free light chains have been monitored:  56.65 (ratio of 5.27) on 07/24/2015 and 70.3 (ratio 6.28) on 08/08/2016.  Chest, abdomen and pelvic CT scan on 08/12/2015 revealed no radiolucent bone lesions. There was no mediastinal or hilar adenopathy.  The ascending aorta measured 4.2 cm in diameter.  Chest, abdomen, and pelvic CT scan on 09/12/2016 revealed no adenopathy or hepatosplenomegaly.  There was mild fusiform  enlargement of the ascending aorta (4.2 x 4.1 cm).  There were numerous hepatic cysts, colonic diverticulosis, an enlarged prostate with partial bladder outlet obstruction, and bilateral inguinal hernias.  Head MRI on  09/08/2016 revealed no acute or reversible findings.  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  There was mild to moderate cerebral atrophy.  EEG on 09/27/2016 revealed right temporal epileptiform discharge and left temporal focal slowing.  Paraneoplastic panel was negative.  He has not had an LP.  He has B12 deficiency on supplementation.  B12 was 154 on 07/24/2015 with an elevated MMA.  Folate was 31 and B12 was 246 (low normal) on 08/15/2016.  MMA was 226 (normal) on 08/30/2016.  He receives B12 monthly (last 10/31/2016).  He is day 1of cycle #2 Velcade, Decadron, and Rituxan (10/31/2016 - 11/21/2016).  He is tolerating treatment well.  Symptomatically, he denies any complaints.  His blood sugar has been elevated.  Exam reveals no adenopathy or hepatosplenomegaly.  He has thrush.  Plan: 1.  Labs today:  CBC with diff, CMP, Mg, SPEP, IgM. 2.  Discuss hyperglycemia.  Phone follow-up with Dr. Ouida Sills today. 3.  Continue acyclovir prophylaxis. 4.  Continue monthly B12 (next due 11/28/2016). 5.  Rx:  Nystatin swish and spit. 6.  Day 1 of cycle #2 Velcade today. 7.  RTC on 11/24/2016 and 3/26 for labs (CBC with diff, CMP) and Velcade. 8.  RTC on 12/01/2016 for MD assessment, labs (CBC with diff, CMP), Velcade and Rituxan. 9.  RTC in 3 weeks for MD assessment, labs (CBC with diff, CMP, SPEP, IgM, Mg), and day 1 of cycle #3 Velcade   Lequita Asal, MD  11/21/2016, 9:01 AM

## 2016-11-21 NOTE — Progress Notes (Signed)
Pt anxious today, tired of coming over. States he is eating and drinking and no pain.

## 2016-11-22 LAB — PROTEIN ELECTROPHORESIS, SERUM
A/G Ratio: 0.9 (ref 0.7–1.7)
Albumin ELP: 3.4 g/dL (ref 2.9–4.4)
Alpha-1-Globulin: 0.2 g/dL (ref 0.0–0.4)
Alpha-2-Globulin: 0.7 g/dL (ref 0.4–1.0)
Beta Globulin: 0.8 g/dL (ref 0.7–1.3)
Gamma Globulin: 2.2 g/dL — ABNORMAL HIGH (ref 0.4–1.8)
Globulin, Total: 3.9 g/dL (ref 2.2–3.9)
M-Spike, %: 1.8 g/dL — ABNORMAL HIGH
Total Protein ELP: 7.3 g/dL (ref 6.0–8.5)

## 2016-11-22 LAB — IGM: IgM, Serum: 2757 mg/dL — ABNORMAL HIGH (ref 20–172)

## 2016-11-25 ENCOUNTER — Inpatient Hospital Stay: Payer: BLUE CROSS/BLUE SHIELD

## 2016-11-25 VITALS — BP 132/85 | HR 84 | Temp 97.9°F | Resp 18

## 2016-11-25 DIAGNOSIS — C880 Waldenstrom macroglobulinemia: Secondary | ICD-10-CM | POA: Diagnosis not present

## 2016-11-25 LAB — COMPREHENSIVE METABOLIC PANEL
ALT: 11 U/L — ABNORMAL LOW (ref 17–63)
AST: 16 U/L (ref 15–41)
Albumin: 3.5 g/dL (ref 3.5–5.0)
Alkaline Phosphatase: 58 U/L (ref 38–126)
Anion gap: 7 (ref 5–15)
BUN: 18 mg/dL (ref 6–20)
CO2: 28 mmol/L (ref 22–32)
Calcium: 8.7 mg/dL — ABNORMAL LOW (ref 8.9–10.3)
Chloride: 98 mmol/L — ABNORMAL LOW (ref 101–111)
Creatinine, Ser: 0.85 mg/dL (ref 0.61–1.24)
GFR calc Af Amer: >60 mL/min (ref >60)
GFR calc non Af Amer: >60 mL/min (ref >60)
Glucose, Bld: 290 mg/dL — ABNORMAL HIGH (ref 65–99)
Potassium: 3.7 mmol/L (ref 3.5–5.1)
Sodium: 133 mmol/L — ABNORMAL LOW (ref 135–145)
Total Bilirubin: 0.6 mg/dL (ref 0.3–1.2)
Total Protein: 7.8 g/dL (ref 6.5–8.1)

## 2016-11-25 LAB — CBC WITH DIFFERENTIAL/PLATELET
Basophils Absolute: 0 10*3/uL (ref 0–0.1)
Basophils Relative: 1 %
Eosinophils Absolute: 0.1 10*3/uL (ref 0–0.7)
Eosinophils Relative: 1 %
HCT: 37.1 % — ABNORMAL LOW (ref 40.0–52.0)
Hemoglobin: 12.5 g/dL — ABNORMAL LOW (ref 13.0–18.0)
Lymphocytes Relative: 15 %
Lymphs Abs: 1 10*3/uL (ref 1.0–3.6)
MCH: 31.5 pg (ref 26.0–34.0)
MCHC: 33.7 g/dL (ref 32.0–36.0)
MCV: 93.5 fL (ref 80.0–100.0)
Monocytes Absolute: 0.5 10*3/uL (ref 0.2–1.0)
Monocytes Relative: 8 %
Neutro Abs: 5 10*3/uL (ref 1.4–6.5)
Neutrophils Relative %: 75 %
Platelets: 293 10*3/uL (ref 150–440)
RBC: 3.97 MIL/uL — ABNORMAL LOW (ref 4.40–5.90)
RDW: 13.2 % (ref 11.5–14.5)
WBC: 6.6 10*3/uL (ref 3.8–10.6)

## 2016-11-25 MED ORDER — ONDANSETRON HCL 4 MG PO TABS
8.0000 mg | ORAL_TABLET | Freq: Once | ORAL | Status: AC
  Administered 2016-11-25: 8 mg via ORAL
  Filled 2016-11-25: qty 2

## 2016-11-25 MED ORDER — BORTEZOMIB CHEMO SQ INJECTION 3.5 MG (2.5MG/ML)
1.3000 mg/m2 | Freq: Once | INTRAMUSCULAR | Status: AC
  Administered 2016-11-25: 2.75 mg via SUBCUTANEOUS
  Filled 2016-11-25: qty 2.75

## 2016-11-28 ENCOUNTER — Inpatient Hospital Stay: Payer: BLUE CROSS/BLUE SHIELD

## 2016-11-28 ENCOUNTER — Other Ambulatory Visit: Payer: Self-pay | Admitting: Hematology and Oncology

## 2016-11-28 VITALS — BP 132/80 | HR 67 | Temp 96.9°F | Resp 16

## 2016-11-28 DIAGNOSIS — C880 Waldenstrom macroglobulinemia: Secondary | ICD-10-CM

## 2016-11-28 LAB — CBC WITH DIFFERENTIAL/PLATELET
Basophils Absolute: 0 10*3/uL (ref 0–0.1)
Basophils Relative: 0 %
Eosinophils Absolute: 0.1 10*3/uL (ref 0–0.7)
Eosinophils Relative: 1 %
HCT: 36.3 % — ABNORMAL LOW (ref 40.0–52.0)
Hemoglobin: 12.1 g/dL — ABNORMAL LOW (ref 13.0–18.0)
Lymphocytes Relative: 13 %
Lymphs Abs: 1 10*3/uL (ref 1.0–3.6)
MCH: 31.3 pg (ref 26.0–34.0)
MCHC: 33.2 g/dL (ref 32.0–36.0)
MCV: 94.3 fL (ref 80.0–100.0)
Monocytes Absolute: 0.7 10*3/uL (ref 0.2–1.0)
Monocytes Relative: 9 %
Neutro Abs: 5.5 10*3/uL (ref 1.4–6.5)
Neutrophils Relative %: 77 %
Platelets: 199 10*3/uL (ref 150–440)
RBC: 3.85 MIL/uL — ABNORMAL LOW (ref 4.40–5.90)
RDW: 13.5 % (ref 11.5–14.5)
WBC: 7.2 10*3/uL (ref 3.8–10.6)

## 2016-11-28 LAB — COMPREHENSIVE METABOLIC PANEL
ALT: 12 U/L — ABNORMAL LOW (ref 17–63)
AST: 15 U/L (ref 15–41)
Albumin: 3.3 g/dL — ABNORMAL LOW (ref 3.5–5.0)
Alkaline Phosphatase: 46 U/L (ref 38–126)
Anion gap: 6 (ref 5–15)
BUN: 25 mg/dL — ABNORMAL HIGH (ref 6–20)
CO2: 29 mmol/L (ref 22–32)
Calcium: 8.5 mg/dL — ABNORMAL LOW (ref 8.9–10.3)
Chloride: 100 mmol/L — ABNORMAL LOW (ref 101–111)
Creatinine, Ser: 0.92 mg/dL (ref 0.61–1.24)
GFR calc Af Amer: >60 mL/min (ref >60)
GFR calc non Af Amer: >60 mL/min (ref >60)
Glucose, Bld: 316 mg/dL — ABNORMAL HIGH (ref 65–99)
Potassium: 3.8 mmol/L (ref 3.5–5.1)
Sodium: 135 mmol/L (ref 135–145)
Total Bilirubin: 0.6 mg/dL (ref 0.3–1.2)
Total Protein: 7.2 g/dL (ref 6.5–8.1)

## 2016-11-28 MED ORDER — BORTEZOMIB CHEMO SQ INJECTION 3.5 MG (2.5MG/ML)
1.3000 mg/m2 | Freq: Once | INTRAMUSCULAR | Status: AC
  Administered 2016-11-28: 2.75 mg via SUBCUTANEOUS
  Filled 2016-11-28: qty 2.75

## 2016-11-28 MED ORDER — ONDANSETRON HCL 4 MG PO TABS
8.0000 mg | ORAL_TABLET | Freq: Once | ORAL | Status: AC
  Administered 2016-11-28: 8 mg via ORAL
  Filled 2016-11-28: qty 2

## 2016-11-28 NOTE — Progress Notes (Signed)
Blood glucose noted to be 316 today.  Okay to proceed with velcade treatment today per Dr. Mike Gip.

## 2016-12-01 ENCOUNTER — Inpatient Hospital Stay: Payer: BLUE CROSS/BLUE SHIELD

## 2016-12-01 ENCOUNTER — Inpatient Hospital Stay: Payer: BLUE CROSS/BLUE SHIELD | Admitting: Hematology and Oncology

## 2016-12-01 ENCOUNTER — Other Ambulatory Visit: Payer: BLUE CROSS/BLUE SHIELD

## 2016-12-01 ENCOUNTER — Ambulatory Visit: Payer: BLUE CROSS/BLUE SHIELD

## 2016-12-02 ENCOUNTER — Inpatient Hospital Stay: Payer: BLUE CROSS/BLUE SHIELD

## 2016-12-02 ENCOUNTER — Other Ambulatory Visit: Payer: Self-pay | Admitting: Hematology and Oncology

## 2016-12-02 DIAGNOSIS — C880 Waldenstrom macroglobulinemia: Secondary | ICD-10-CM

## 2016-12-02 DIAGNOSIS — E538 Deficiency of other specified B group vitamins: Secondary | ICD-10-CM

## 2016-12-02 LAB — COMPREHENSIVE METABOLIC PANEL
ALT: 10 U/L — ABNORMAL LOW (ref 17–63)
AST: 14 U/L — ABNORMAL LOW (ref 15–41)
Albumin: 3.4 g/dL — ABNORMAL LOW (ref 3.5–5.0)
Alkaline Phosphatase: 43 U/L (ref 38–126)
Anion gap: 5 (ref 5–15)
BUN: 24 mg/dL — ABNORMAL HIGH (ref 6–20)
CO2: 27 mmol/L (ref 22–32)
Calcium: 8.5 mg/dL — ABNORMAL LOW (ref 8.9–10.3)
Chloride: 101 mmol/L (ref 101–111)
Creatinine, Ser: 0.87 mg/dL (ref 0.61–1.24)
GFR calc Af Amer: >60 mL/min (ref >60)
GFR calc non Af Amer: >60 mL/min (ref >60)
Glucose, Bld: 230 mg/dL — ABNORMAL HIGH (ref 65–99)
Potassium: 3.8 mmol/L (ref 3.5–5.1)
Sodium: 133 mmol/L — ABNORMAL LOW (ref 135–145)
Total Bilirubin: 0.6 mg/dL (ref 0.3–1.2)
Total Protein: 7.5 g/dL (ref 6.5–8.1)

## 2016-12-02 LAB — CBC WITH DIFFERENTIAL/PLATELET
Basophils Absolute: 0 10*3/uL (ref 0–0.1)
Basophils Relative: 0 %
Eosinophils Absolute: 0.1 10*3/uL (ref 0–0.7)
Eosinophils Relative: 1 %
HCT: 35.9 % — ABNORMAL LOW (ref 40.0–52.0)
Hemoglobin: 12.4 g/dL — ABNORMAL LOW (ref 13.0–18.0)
Lymphocytes Relative: 20 %
Lymphs Abs: 1.3 10*3/uL (ref 1.0–3.6)
MCH: 31.9 pg (ref 26.0–34.0)
MCHC: 34.4 g/dL (ref 32.0–36.0)
MCV: 92.6 fL (ref 80.0–100.0)
Monocytes Absolute: 0.7 10*3/uL (ref 0.2–1.0)
Monocytes Relative: 11 %
Neutro Abs: 4.5 10*3/uL (ref 1.4–6.5)
Neutrophils Relative %: 68 %
Platelets: 161 10*3/uL (ref 150–440)
RBC: 3.88 MIL/uL — ABNORMAL LOW (ref 4.40–5.90)
RDW: 13.1 % (ref 11.5–14.5)
WBC: 6.6 10*3/uL (ref 3.8–10.6)

## 2016-12-02 MED ORDER — SODIUM CHLORIDE 0.9 % IV SOLN
375.0000 mg/m2 | Freq: Once | INTRAVENOUS | Status: AC
  Administered 2016-12-02: 800 mg via INTRAVENOUS
  Filled 2016-12-02: qty 50

## 2016-12-02 MED ORDER — CYANOCOBALAMIN 1000 MCG/ML IJ SOLN
1000.0000 ug | Freq: Once | INTRAMUSCULAR | Status: AC
  Administered 2016-12-02: 1000 ug via INTRAMUSCULAR
  Filled 2016-12-02: qty 1

## 2016-12-02 MED ORDER — BORTEZOMIB CHEMO SQ INJECTION 3.5 MG (2.5MG/ML)
1.3000 mg/m2 | Freq: Once | INTRAMUSCULAR | Status: AC
Start: 2016-12-02 — End: 2016-12-02
  Administered 2016-12-02: 2.75 mg via SUBCUTANEOUS
  Filled 2016-12-02: qty 2.75

## 2016-12-02 MED ORDER — SODIUM CHLORIDE 0.9 % IV SOLN
Freq: Once | INTRAVENOUS | Status: AC
  Administered 2016-12-02: 09:00:00 via INTRAVENOUS
  Filled 2016-12-02: qty 1000

## 2016-12-02 MED ORDER — ACETAMINOPHEN 325 MG PO TABS
650.0000 mg | ORAL_TABLET | Freq: Once | ORAL | Status: AC
  Administered 2016-12-02: 650 mg via ORAL
  Filled 2016-12-02: qty 2

## 2016-12-02 MED ORDER — SODIUM CHLORIDE 0.9 % IV SOLN: 375.0000 mg/m2 | Freq: Once | INTRAVENOUS | Status: DC

## 2016-12-02 MED ORDER — DIPHENHYDRAMINE HCL 50 MG/ML IJ SOLN
50.0000 mg | Freq: Once | INTRAMUSCULAR | Status: AC
  Administered 2016-12-02: 50 mg via INTRAVENOUS
  Filled 2016-12-02: qty 1

## 2016-12-02 MED ORDER — ONDANSETRON HCL 4 MG PO TABS
8.0000 mg | ORAL_TABLET | Freq: Once | ORAL | Status: AC
  Administered 2016-12-02: 8 mg via ORAL
  Filled 2016-12-02: qty 2

## 2016-12-12 ENCOUNTER — Inpatient Hospital Stay (HOSPITAL_BASED_OUTPATIENT_CLINIC_OR_DEPARTMENT_OTHER): Payer: BLUE CROSS/BLUE SHIELD | Admitting: Hematology and Oncology

## 2016-12-12 ENCOUNTER — Inpatient Hospital Stay: Payer: BLUE CROSS/BLUE SHIELD | Attending: Hematology and Oncology

## 2016-12-12 ENCOUNTER — Encounter: Payer: Self-pay | Admitting: Hematology and Oncology

## 2016-12-12 ENCOUNTER — Inpatient Hospital Stay: Payer: BLUE CROSS/BLUE SHIELD

## 2016-12-12 ENCOUNTER — Other Ambulatory Visit: Payer: Self-pay | Admitting: Hematology and Oncology

## 2016-12-12 ENCOUNTER — Ambulatory Visit
Admission: RE | Admit: 2016-12-12 | Discharge: 2016-12-12 | Disposition: A | Discharge status: Home / Self Care | Payer: BLUE CROSS/BLUE SHIELD | Source: Ambulatory Visit | Attending: Hematology and Oncology | Admitting: Hematology and Oncology

## 2016-12-12 VITALS — BP 140/88 | HR 93 | Temp 97.4°F | Resp 18 | Wt 221.2 lb

## 2016-12-12 DIAGNOSIS — Z5111 Encounter for antineoplastic chemotherapy: Secondary | ICD-10-CM

## 2016-12-12 DIAGNOSIS — D472 Monoclonal gammopathy: Secondary | ICD-10-CM | POA: Diagnosis not present

## 2016-12-12 DIAGNOSIS — E538 Deficiency of other specified B group vitamins: Secondary | ICD-10-CM | POA: Insufficient documentation

## 2016-12-12 DIAGNOSIS — H409 Unspecified glaucoma: Secondary | ICD-10-CM | POA: Insufficient documentation

## 2016-12-12 DIAGNOSIS — Z8041 Family history of malignant neoplasm of ovary: Secondary | ICD-10-CM

## 2016-12-12 DIAGNOSIS — Z7984 Long term (current) use of oral hypoglycemic drugs: Secondary | ICD-10-CM | POA: Diagnosis not present

## 2016-12-12 DIAGNOSIS — R739 Hyperglycemia, unspecified: Secondary | ICD-10-CM | POA: Diagnosis not present

## 2016-12-12 DIAGNOSIS — R600 Localized edema: Secondary | ICD-10-CM

## 2016-12-12 DIAGNOSIS — Z8673 Personal history of transient ischemic attack (TIA), and cerebral infarction without residual deficits: Secondary | ICD-10-CM | POA: Diagnosis not present

## 2016-12-12 DIAGNOSIS — F0390 Unspecified dementia without behavioral disturbance: Secondary | ICD-10-CM

## 2016-12-12 DIAGNOSIS — Z803 Family history of malignant neoplasm of breast: Secondary | ICD-10-CM

## 2016-12-12 DIAGNOSIS — R197 Diarrhea, unspecified: Secondary | ICD-10-CM | POA: Diagnosis not present

## 2016-12-12 DIAGNOSIS — Z79899 Other long term (current) drug therapy: Secondary | ICD-10-CM | POA: Diagnosis not present

## 2016-12-12 DIAGNOSIS — C8800 Waldenstrom macroglobulinemia not having achieved remission: Secondary | ICD-10-CM

## 2016-12-12 DIAGNOSIS — R21 Rash and other nonspecific skin eruption: Secondary | ICD-10-CM | POA: Diagnosis not present

## 2016-12-12 DIAGNOSIS — N400 Enlarged prostate without lower urinary tract symptoms: Secondary | ICD-10-CM

## 2016-12-12 DIAGNOSIS — R350 Frequency of micturition: Secondary | ICD-10-CM | POA: Diagnosis not present

## 2016-12-12 DIAGNOSIS — Z5112 Encounter for antineoplastic immunotherapy: Secondary | ICD-10-CM | POA: Insufficient documentation

## 2016-12-12 DIAGNOSIS — I10 Essential (primary) hypertension: Secondary | ICD-10-CM | POA: Diagnosis not present

## 2016-12-12 DIAGNOSIS — R1030 Lower abdominal pain, unspecified: Secondary | ICD-10-CM | POA: Insufficient documentation

## 2016-12-12 DIAGNOSIS — R3915 Urgency of urination: Secondary | ICD-10-CM | POA: Diagnosis not present

## 2016-12-12 LAB — COMPREHENSIVE METABOLIC PANEL
ALT: 13 U/L — ABNORMAL LOW (ref 17–63)
AST: 18 U/L (ref 15–41)
Albumin: 3.5 g/dL (ref 3.5–5.0)
Alkaline Phosphatase: 50 U/L (ref 38–126)
Anion gap: 8 (ref 5–15)
BUN: 17 mg/dL (ref 6–20)
CO2: 27 mmol/L (ref 22–32)
Calcium: 9 mg/dL (ref 8.9–10.3)
Chloride: 101 mmol/L (ref 101–111)
Creatinine, Ser: 0.79 mg/dL (ref 0.61–1.24)
GFR calc Af Amer: >60 mL/min (ref >60)
GFR calc non Af Amer: >60 mL/min (ref >60)
Glucose, Bld: 269 mg/dL — ABNORMAL HIGH (ref 65–99)
Potassium: 3.6 mmol/L (ref 3.5–5.1)
Sodium: 136 mmol/L (ref 135–145)
Total Bilirubin: 0.6 mg/dL (ref 0.3–1.2)
Total Protein: 7.5 g/dL (ref 6.5–8.1)

## 2016-12-12 LAB — CBC WITH DIFFERENTIAL/PLATELET
Basophils Absolute: 0 10*3/uL (ref 0–0.1)
Basophils Relative: 1 %
Eosinophils Absolute: 0.1 10*3/uL (ref 0–0.7)
Eosinophils Relative: 1 %
HCT: 37.5 % — ABNORMAL LOW (ref 40.0–52.0)
Hemoglobin: 12.8 g/dL — ABNORMAL LOW (ref 13.0–18.0)
Lymphocytes Relative: 15 %
Lymphs Abs: 1.1 10*3/uL (ref 1.0–3.6)
MCH: 31.8 pg (ref 26.0–34.0)
MCHC: 34.2 g/dL (ref 32.0–36.0)
MCV: 93 fL (ref 80.0–100.0)
Monocytes Absolute: 0.7 10*3/uL (ref 0.2–1.0)
Monocytes Relative: 9 %
Neutro Abs: 5.5 10*3/uL (ref 1.4–6.5)
Neutrophils Relative %: 74 %
Platelets: 245 10*3/uL (ref 150–440)
RBC: 4.03 MIL/uL — ABNORMAL LOW (ref 4.40–5.90)
RDW: 13.2 % (ref 11.5–14.5)
WBC: 7.4 10*3/uL (ref 3.8–10.6)

## 2016-12-12 MED ORDER — ONDANSETRON HCL 4 MG PO TABS
8.0000 mg | ORAL_TABLET | Freq: Once | ORAL | Status: AC
  Administered 2016-12-12: 8 mg via ORAL
  Filled 2016-12-12: qty 2

## 2016-12-12 MED ORDER — BORTEZOMIB CHEMO SQ INJECTION 3.5 MG (2.5MG/ML)
1.3000 mg/m2 | Freq: Once | INTRAMUSCULAR | Status: AC
  Administered 2016-12-12: 2.75 mg via SUBCUTANEOUS
  Filled 2016-12-12: qty 2.75

## 2016-12-12 NOTE — Progress Notes (Signed)
Patient is accompanied by his sister in law who is interested in how the treatment is working.

## 2016-12-12 NOTE — Progress Notes (Signed)
[facility] day:  12/12/2016  Chief Complaint: Adrian Valdez is a 66 y.o. male with Waldenstrom's macroglobulinemia who is seen for assessment prior day 1 of cycle #3 Velcade, Decadron, and Rituxan.  HPI:  The patient was last seen in the medical oncology clinic on 11/21/2016.  At that time, he was seen for assessment prior to cycle #2.  He had thrush.  Nystatin swish and spit was prescribed.  He had issues with hyperglycemia on Decadron.  He had follow-up with Dr. Ouida Sills.  He received Velcade on 03/19, 03/23, 03/26, and 12/02/2016.  He received Rituxan on 12/02/2016.  During the interim, he has has issues with hyperglycemia.  He is on metformin.  He notes diarrhea yesterday.  He beleeves it is from contrast  Taken from his CT scan (09/2016).  He denies any sick contacts.  No there family members have diarrhea.  He denies any new medications.  His vision remains poor.  He has glaucoma and is seeing Dr. Remo Lipps Dingeldein at [facility].  He has a rash on the right side of his face.  His mentation remains the same.   Past Medical History:  Diagnosis Date  . 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 B12 deficiency 07/27/2015  . Dementia   . Enlarged prostate   . Glaucoma   . Glaucoma   . Hypertension   . Urinary frequency   . Vitamin B 12 deficiency     Past Surgical History:  Procedure Laterality Date  . KNEE ARTHROSCOPY Right   . LASIK      Family History  Problem Relation Age of Onset  . Hypertension Mother   . Hypertension Father   . CAD Father   . Breast cancer Maternal Grandmother   . Ovarian cancer Paternal Grandmother   . Kidney disease Neg Hx   . Prostate cancer Neg Hx     Social History:  reports that he has never smoked. He has never used smokeless tobacco. He reports that he drinks alcohol. He reports that he does not use drugs.  He notes that he has 6 PhDs in fine arts, music, etc from the [employer], and [employer].  He lives with his parents.  The patient is accompanied by his sister-in-law today.  Allergies:  Allergies  Allergen Reactions  . Oxybutynin Other (See Comments)    Headache  . Myrbetriq [Mirabegron] Other (See Comments)    Other reaction(s): Other (See Comments) migraine migraine     Current Medications: Current Outpatient Prescriptions  Medication Sig Dispense Refill  . acyclovir (ZOVIRAX) 400 MG tablet Take 1 tablet (400 mg total) by mouth 2 (two) times daily. 60 tablet 3  . dexamethasone (DECADRON) 4 MG tablet Take 5 tablets in the morning prior to chemotherapy (day 1,4,8, and 11). 20 tablet 3  . donepezil (ARICEPT) 5 MG tablet Take 5 mg by mouth at bedtime.     Valdez Kitchen erythromycin ophthalmic ointment 1 application at bedtime.    . finasteride (PROSCAR) 5 MG tablet Take 5 mg by mouth daily. Reported on 11/30/2015    . lisinopril (PRINIVIL,ZESTRIL) 10 MG tablet Take by mouth. Reported on 11/30/2015    . metFORMIN (GLUMETZA) 500 MG (MOD) 24 hr tablet Take 500 mg by mouth. Take 2 tablets (1000 mg) by mouth daily with dinner.    . methazolamide (NEPTAZANE) 50 MG tablet TK 1 T PO BID  1  . nystatin (MYCOSTATIN)  100000 UNIT/ML suspension Take 5 mLs (500,000 Units total) by mouth 4 (four) times daily. 200 mL 1  . ondansetron (ZOFRAN) 8 MG tablet Take 1 tablet (8 mg total) by mouth 2 (two) times daily as needed (Nausea or vomiting). 30 tablet 1  . tamsulosin (FLOMAX) 0.4 MG CAPS capsule TAKE ONE CAPSULE BY MOUTH ONCE DAILY, 30 MINUTES AFTER THE SAME MEAL EACH DAY    . timolol (BETIMOL) 0.5 % ophthalmic solution Apply to eye. Reported on 11/30/2015    . Travoprost, BAK Free, (TRAVATAN) 0.004 % SOLN ophthalmic solution 1 drop at bedtime.     No current facility-administered medications for this visit.     Review of Systems:  GENERAL:  Feels "the same".  No fevers or sweats.  Weight up 8 pounds. PERFORMANCE STATUS (ECOG):  2 HEENT:  Vision  cloudy secondary to glaucoma.  No runny nose, sore throat, mouth sores or tenderness. Lungs: No shortness of breath or cough.  No hemoptysis. Cardiac:  No chest pain, palpitations, orthopnea, or PND. GI:  Appetite good.  Diarrhea.  No  nausea, diarrhea, constipation, melena or hematochezia. GU:  Enlarged prostate.  No urgency, frequency, dysuria, or hematuria. Musculoskeletal:  No back pain.  Right knee issues.  No muscle tenderness. Extremities:  No pain or swelling. Skin:  Right sided facial rash.  Neuro:  Trouble with thinking (chronic).  No headache, numbness or weakness, balance or coordination issues. Endocrine:  Elevated blood sugar on Metformin.  No thyroid issues, hot flashes or night sweats. Psych:  Poor sleep.  No mood changes, depression or anxiety. Pain:  No focal pain. Review of systems:  All other systems reviewed and found to be negative.  Physical Exam: BP (!) 146/91 (BP Location: Left Arm, Patient Position: Sitting)   Pulse 83   Temp 97.4 F (36.3 C) (Tympanic)   Resp 18   Wt 221 lb 3 oz (100.3 kg)   BMI 30.85 kg/m   GENERAL:  Well developed, well nourished, gentleman sitting comfortably in the exam room. MENTAL STATUS:  Alert and oriented to person and place. Unsure of date today.  HEAD:  Pearline Cables hair.  Male pattern baldness.  Normocephalic, atraumatic, face symmetric, no Cushingoid features. EYES: Left eye stye.  Pupils equal round and reactive to light and accomodation.  No conjunctivitis or scleral icterus. ENT:  No oral lesions.  Tongue normal. Mucous membranes moist.  RESPIRATORY:  Clear to auscultation without rales, wheezes or rhonchi. CARDIOVASCULAR:  Regular rate and rhythm without murmur, rub or gallop. ABDOMEN:  Soft, non-tender, with active bowel sounds, and no hepatosplenomegaly.  No masses. SKIN:  Right sided slightly brown facial rash.  No vesicles.  No ulcers or lesions. EXTREMITIES: Right lower extremity edema.  No skin discoloration or tenderness.  No  palpable cords. LYMPH NODES: No palpable cervical, supraclavicular, axillary or inguinal adenopathy  NEUROLOGICAL:  No change. PSYCH:  Flat affect. Conversant.   Appointment on 12/12/2016  Component Date Value Ref Range Status  . WBC 12/12/2016 7.4  3.8 - 10.6 K/uL Final  . RBC 12/12/2016 4.03* 4.40 - 5.90 MIL/uL Final  . Hemoglobin 12/12/2016 12.8* 13.0 - 18.0 g/dL Final  . HCT 12/12/2016 37.5* 40.0 - 52.0 % Final  . MCV 12/12/2016 93.0  80.0 - 100.0 fL Final  . MCH 12/12/2016 31.8  26.0 - 34.0 pg Final  . MCHC 12/12/2016 34.2  32.0 - 36.0 g/dL Final  . RDW 12/12/2016 13.2  11.5 - 14.5 % Final  . Platelets 12/12/2016 245  150 - 440 K/uL Final  . Neutrophils Relative % 12/12/2016 74  % Final  . Neutro Abs 12/12/2016 5.5  1.4 - 6.5 K/uL Final  . Lymphocytes Relative 12/12/2016 15  % Final  . Lymphs Abs 12/12/2016 1.1  1.0 - 3.6 K/uL Final  . Monocytes Relative 12/12/2016 9  % Final  . Monocytes Absolute 12/12/2016 0.7  0.2 - 1.0 K/uL Final  . Eosinophils Relative 12/12/2016 1  % Final  . Eosinophils Absolute 12/12/2016 0.1  0 - 0.7 K/uL Final  . Basophils Relative 12/12/2016 1  % Final  . Basophils Absolute 12/12/2016 0.0  0 - 0.1 K/uL Final  . Sodium 12/12/2016 136  135 - 145 mmol/L Final  . Potassium 12/12/2016 3.6  3.5 - 5.1 mmol/L Final  . Chloride 12/12/2016 101  101 - 111 mmol/L Final  . CO2 12/12/2016 27  22 - 32 mmol/L Final  . Glucose, Bld 12/12/2016 269* 65 - 99 mg/dL Final  . BUN 12/12/2016 17  6 - 20 mg/dL Final  . Creatinine, Ser 12/12/2016 0.79  0.61 - 1.24 mg/dL Final  . Calcium 12/12/2016 9.0  8.9 - 10.3 mg/dL Final  . Total Protein 12/12/2016 7.5  6.5 - 8.1 g/dL Final  . Albumin 12/12/2016 3.5  3.5 - 5.0 g/dL Final  . AST 12/12/2016 18  15 - 41 U/L Final  . ALT 12/12/2016 13* 17 - 63 U/L Final  . Alkaline Phosphatase 12/12/2016 50  38 - 126 U/L Final  . Total Bilirubin 12/12/2016 0.6  0.3 - 1.2 mg/dL Final  . GFR calc non Af Amer 12/12/2016 >60  >60 mL/min  Final  . GFR calc Af Amer 12/12/2016 >60  >60 mL/min Final   Comment: (NOTE) The eGFR has been calculated using the CKD EPI equation. This calculation has not been validated in all clinical situations. eGFR's persistently <60 mL/min signify possible Chronic Kidney Disease.   . Anion gap 12/12/2016 8  5 - 15 Final    Assessment:  Adrian Valdez is a 66 y.o. 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  Work-up on 08/15/2016 revealed an IgM of 3808.  Serum viscosity was 2.5 (1.6-1.9).  Beta2-microglobulin was 2.5 (0.6-2.4).  Negative studies included:  hepatitis B surface antigen, hepatitis B core antibody, hepatitis C antibody, LDH, and CRP.   Bone marrow biopsy on 08/23/2016 revealed a mature B-cell lymphoma with plasmacytic differentiation, predominantly paratrabecular infiltrate of lymphoplasmacytic infiltrate accounts for 10-15% of marrow space involvement.  Marrow was normocellular for age (50-60%) with trilineage hematopoiesis.  There was mild to moderate paratrabecular increase in reticulin fibers associated with lymphoplasmacytic infiltrate.  Congo red stain was negative for amyloid.  Storage iron was present.    Bone marrow flow cytometry revealed a monotypic B-cell population (0.6% of sample) with a non-specific immunophenotype.  There was abnormal/monotypic plasma cell population (< 0.1% of sample).  Cytogenetics were normal (46, XY).  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  Bone survey on 08/18/2016 revealed no evidence of bony metastatic disease.  There are no CRAB criteria (hypercalciumia, renal insufficiency, anemia or bone lesions).  Concern is raised for possible contribution of Waldenstrom's to decline in performance status (mentation).  Serum viscosity is mildly elevated.  He has no palpable adenopathy or  hepatosplenomegaly.   SPEP has been monitored:  1.6 gm/dL on 07/09/2015, 2.4 on 07/24/2015, 2.8 on 08/11/2016, 2.0 on  10/31/2016, and 1.8 on 11/21/2016.  IgM has been monitored:  3808 on 08/15/2016, 3531 on 10/25/2016, and 2757 on 11/21/2016.  Kappa free light chains have been monitored:  56.65 (ratio of 5.27) on 07/24/2015 and 70.3 (ratio 6.28) on 08/08/2016.  Chest, abdomen and pelvic CT scan on 08/12/2015 revealed no radiolucent bone lesions. There was no mediastinal or hilar adenopathy.  The ascending aorta measured 4.2 cm in diameter.  Chest, abdomen, and pelvic CT scan on 09/12/2016 revealed no adenopathy or hepatosplenomegaly.  There was mild fusiform enlargement of the ascending aorta (4.2 x 4.1 cm).  There were numerous hepatic cysts, colonic diverticulosis, an enlarged prostate with partial bladder outlet obstruction, and bilateral inguinal hernias.  Head MRI on 09/08/2016 revealed no acute or reversible findings.  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  There was mild to moderate cerebral atrophy.  EEG on 09/27/2016 revealed right temporal epileptiform discharge and left temporal focal slowing.  Paraneoplastic panel was negative.  He has not had an LP.  He has B12 deficiency on supplementation.  B12 was 154 on 07/24/2015 with an elevated MMA.  Folate was 31 and B12 was 246 (low normal) on 08/15/2016.  MMA was 226 (normal) on 08/30/2016.  He receives B12 monthly (last 12/02/2016).  He is day 1 of cycle #3 Velcade, Decadron, and Rituxan (10/31/2016 - 12/12/2016).  He is tolerating treatment well.  Symptomatically, his blood sugar has been elevated.  He has a 1 day history of diarrhea.  Exam reveals no adenopathy or hepatosplenomegaly.  He has a new right sided facial rash.  Blood sugar is 269.  Plan: 1.  Labs today:  CBC with diff, CMP, Mg, SPEP, IgM. 2.  Begin cycle #3 day 1 Velcade today. 3.  Right lower extremity duplex today after chemotherapy. 4.  Stool  studies (C diff, ova and parasites). 5.  Dermatology consult- right facial rash 6.  Continue acyclovir prophylaxis. 7.  Continue monthly B12 (next due 12/30/2016). 8.  RTC on 12/15/2016 and 04/16 for labs (CBC with diff, CMP) and Velcade. 9.  RTC on 12/22/2016 for MD assessment, labs (CBC with diff, CMP), Velcade and Rituxan. 10.  RTC in 3 weeks for MD assessment, labs (CBC with diff, CMP, SPEP, IgM, Mg), and day 1 of cycle #4 Velcade.  Addendum:  Right lower extremity duplex today revealed no evidence of DVT.   Lequita Asal, MD  12/12/2016, 11:00 AM

## 2016-12-15 ENCOUNTER — Other Ambulatory Visit: Payer: Self-pay | Admitting: Hematology and Oncology

## 2016-12-15 ENCOUNTER — Inpatient Hospital Stay: Payer: BLUE CROSS/BLUE SHIELD

## 2016-12-15 VITALS — BP 131/82 | HR 81 | Temp 98.9°F

## 2016-12-15 DIAGNOSIS — Z5111 Encounter for antineoplastic chemotherapy: Secondary | ICD-10-CM

## 2016-12-15 DIAGNOSIS — R600 Localized edema: Secondary | ICD-10-CM

## 2016-12-15 DIAGNOSIS — R21 Rash and other nonspecific skin eruption: Secondary | ICD-10-CM

## 2016-12-15 DIAGNOSIS — R197 Diarrhea, unspecified: Secondary | ICD-10-CM

## 2016-12-15 DIAGNOSIS — E538 Deficiency of other specified B group vitamins: Secondary | ICD-10-CM

## 2016-12-15 DIAGNOSIS — C880 Waldenstrom macroglobulinemia: Secondary | ICD-10-CM | POA: Diagnosis not present

## 2016-12-15 LAB — COMPREHENSIVE METABOLIC PANEL
ALT: 14 U/L — ABNORMAL LOW (ref 17–63)
AST: 19 U/L (ref 15–41)
Albumin: 3.6 g/dL (ref 3.5–5.0)
Alkaline Phosphatase: 51 U/L (ref 38–126)
Anion gap: 6 (ref 5–15)
BUN: 23 mg/dL — ABNORMAL HIGH (ref 6–20)
CO2: 26 mmol/L (ref 22–32)
Calcium: 8.9 mg/dL (ref 8.9–10.3)
Chloride: 104 mmol/L (ref 101–111)
Creatinine, Ser: 0.77 mg/dL (ref 0.61–1.24)
GFR calc Af Amer: >60 mL/min (ref >60)
GFR calc non Af Amer: >60 mL/min (ref >60)
Glucose, Bld: 192 mg/dL — ABNORMAL HIGH (ref 65–99)
Potassium: 3.7 mmol/L (ref 3.5–5.1)
Sodium: 136 mmol/L (ref 135–145)
Total Bilirubin: 0.4 mg/dL (ref 0.3–1.2)
Total Protein: 7.5 g/dL (ref 6.5–8.1)

## 2016-12-15 LAB — CBC WITH DIFFERENTIAL/PLATELET
Basophils Absolute: 0.1 10*3/uL (ref 0–0.1)
Basophils Relative: 1 %
Eosinophils Absolute: 0.1 10*3/uL (ref 0–0.7)
Eosinophils Relative: 2 %
HCT: 37.4 % — ABNORMAL LOW (ref 40.0–52.0)
Hemoglobin: 12.8 g/dL — ABNORMAL LOW (ref 13.0–18.0)
Lymphocytes Relative: 16 %
Lymphs Abs: 1.4 10*3/uL (ref 1.0–3.6)
MCH: 32.1 pg (ref 26.0–34.0)
MCHC: 34.3 g/dL (ref 32.0–36.0)
MCV: 93.4 fL (ref 80.0–100.0)
Monocytes Absolute: 0.7 10*3/uL (ref 0.2–1.0)
Monocytes Relative: 8 %
Neutro Abs: 6.6 10*3/uL — ABNORMAL HIGH (ref 1.4–6.5)
Neutrophils Relative %: 73 %
Platelets: 282 10*3/uL (ref 150–440)
RBC: 4 MIL/uL — ABNORMAL LOW (ref 4.40–5.90)
RDW: 13.2 % (ref 11.5–14.5)
WBC: 8.9 10*3/uL (ref 3.8–10.6)

## 2016-12-15 MED ORDER — BORTEZOMIB CHEMO SQ INJECTION 3.5 MG (2.5MG/ML)
1.3000 mg/m2 | Freq: Once | INTRAMUSCULAR | Status: AC
  Administered 2016-12-15: 2.75 mg via SUBCUTANEOUS
  Filled 2016-12-15: qty 2.75

## 2016-12-15 MED ORDER — ONDANSETRON HCL 4 MG PO TABS
8.0000 mg | ORAL_TABLET | Freq: Once | ORAL | Status: AC
  Administered 2016-12-15: 8 mg via ORAL
  Filled 2016-12-15: qty 2

## 2016-12-19 ENCOUNTER — Inpatient Hospital Stay: Payer: BLUE CROSS/BLUE SHIELD

## 2016-12-19 VITALS — BP 128/87 | HR 66 | Temp 96.7°F | Resp 16

## 2016-12-19 DIAGNOSIS — C880 Waldenstrom macroglobulinemia: Secondary | ICD-10-CM | POA: Diagnosis not present

## 2016-12-19 DIAGNOSIS — R21 Rash and other nonspecific skin eruption: Secondary | ICD-10-CM

## 2016-12-19 DIAGNOSIS — R197 Diarrhea, unspecified: Secondary | ICD-10-CM

## 2016-12-19 DIAGNOSIS — E538 Deficiency of other specified B group vitamins: Secondary | ICD-10-CM

## 2016-12-19 DIAGNOSIS — Z5111 Encounter for antineoplastic chemotherapy: Secondary | ICD-10-CM

## 2016-12-19 DIAGNOSIS — R600 Localized edema: Secondary | ICD-10-CM

## 2016-12-19 LAB — CBC WITH DIFFERENTIAL/PLATELET
Basophils Absolute: 0.1 10*3/uL (ref 0–0.1)
Basophils Relative: 1 %
Eosinophils Absolute: 0.1 10*3/uL (ref 0–0.7)
Eosinophils Relative: 2 %
HCT: 37.2 % — ABNORMAL LOW (ref 40.0–52.0)
Hemoglobin: 12.7 g/dL — ABNORMAL LOW (ref 13.0–18.0)
Lymphocytes Relative: 18 %
Lymphs Abs: 1.2 10*3/uL (ref 1.0–3.6)
MCH: 32.1 pg (ref 26.0–34.0)
MCHC: 34.1 g/dL (ref 32.0–36.0)
MCV: 94.3 fL (ref 80.0–100.0)
Monocytes Absolute: 0.6 10*3/uL (ref 0.2–1.0)
Monocytes Relative: 10 %
Neutro Abs: 4.7 10*3/uL (ref 1.4–6.5)
Neutrophils Relative %: 69 %
Platelets: 220 10*3/uL (ref 150–440)
RBC: 3.94 MIL/uL — ABNORMAL LOW (ref 4.40–5.90)
RDW: 13.5 % (ref 11.5–14.5)
WBC: 6.7 10*3/uL (ref 3.8–10.6)

## 2016-12-19 LAB — COMPREHENSIVE METABOLIC PANEL
ALT: 11 U/L — ABNORMAL LOW (ref 17–63)
AST: 14 U/L — ABNORMAL LOW (ref 15–41)
Albumin: 3.6 g/dL (ref 3.5–5.0)
Alkaline Phosphatase: 41 U/L (ref 38–126)
Anion gap: 6 (ref 5–15)
BUN: 21 mg/dL — ABNORMAL HIGH (ref 6–20)
CO2: 29 mmol/L (ref 22–32)
Calcium: 9.2 mg/dL (ref 8.9–10.3)
Chloride: 101 mmol/L (ref 101–111)
Creatinine, Ser: 0.82 mg/dL (ref 0.61–1.24)
GFR calc Af Amer: >60 mL/min (ref >60)
GFR calc non Af Amer: >60 mL/min (ref >60)
Glucose, Bld: 189 mg/dL — ABNORMAL HIGH (ref 65–99)
Potassium: 3.5 mmol/L (ref 3.5–5.1)
Sodium: 136 mmol/L (ref 135–145)
Total Bilirubin: 0.5 mg/dL (ref 0.3–1.2)
Total Protein: 7.2 g/dL (ref 6.5–8.1)

## 2016-12-19 MED ORDER — ONDANSETRON HCL 4 MG PO TABS
8.0000 mg | ORAL_TABLET | Freq: Once | ORAL | Status: AC
  Administered 2016-12-19: 8 mg via ORAL
  Filled 2016-12-19: qty 2

## 2016-12-19 MED ORDER — BORTEZOMIB CHEMO SQ INJECTION 3.5 MG (2.5MG/ML)
1.3000 mg/m2 | Freq: Once | INTRAMUSCULAR | Status: AC
  Administered 2016-12-19: 2.75 mg via SUBCUTANEOUS
  Filled 2016-12-19: qty 2.75

## 2016-12-22 ENCOUNTER — Inpatient Hospital Stay (HOSPITAL_BASED_OUTPATIENT_CLINIC_OR_DEPARTMENT_OTHER): Payer: BLUE CROSS/BLUE SHIELD | Admitting: Hematology and Oncology

## 2016-12-22 ENCOUNTER — Other Ambulatory Visit: Payer: Self-pay | Admitting: Hematology and Oncology

## 2016-12-22 ENCOUNTER — Encounter: Payer: Self-pay | Admitting: Hematology and Oncology

## 2016-12-22 ENCOUNTER — Inpatient Hospital Stay: Payer: BLUE CROSS/BLUE SHIELD

## 2016-12-22 ENCOUNTER — Telehealth: Payer: Self-pay | Admitting: *Deleted

## 2016-12-22 VITALS — BP 136/90 | HR 76 | Temp 97.4°F | Resp 18 | Wt 229.6 lb

## 2016-12-22 DIAGNOSIS — R600 Localized edema: Secondary | ICD-10-CM

## 2016-12-22 DIAGNOSIS — E538 Deficiency of other specified B group vitamins: Secondary | ICD-10-CM | POA: Diagnosis not present

## 2016-12-22 DIAGNOSIS — Z8041 Family history of malignant neoplasm of ovary: Secondary | ICD-10-CM

## 2016-12-22 DIAGNOSIS — N400 Enlarged prostate without lower urinary tract symptoms: Secondary | ICD-10-CM

## 2016-12-22 DIAGNOSIS — I10 Essential (primary) hypertension: Secondary | ICD-10-CM | POA: Diagnosis not present

## 2016-12-22 DIAGNOSIS — Z5111 Encounter for antineoplastic chemotherapy: Secondary | ICD-10-CM

## 2016-12-22 DIAGNOSIS — C880 Waldenstrom macroglobulinemia: Secondary | ICD-10-CM

## 2016-12-22 DIAGNOSIS — F0390 Unspecified dementia without behavioral disturbance: Secondary | ICD-10-CM

## 2016-12-22 DIAGNOSIS — Z7984 Long term (current) use of oral hypoglycemic drugs: Secondary | ICD-10-CM

## 2016-12-22 DIAGNOSIS — Z8673 Personal history of transient ischemic attack (TIA), and cerebral infarction without residual deficits: Secondary | ICD-10-CM

## 2016-12-22 DIAGNOSIS — R739 Hyperglycemia, unspecified: Secondary | ICD-10-CM

## 2016-12-22 DIAGNOSIS — Z5112 Encounter for antineoplastic immunotherapy: Secondary | ICD-10-CM | POA: Diagnosis not present

## 2016-12-22 DIAGNOSIS — R21 Rash and other nonspecific skin eruption: Secondary | ICD-10-CM

## 2016-12-22 DIAGNOSIS — Z803 Family history of malignant neoplasm of breast: Secondary | ICD-10-CM

## 2016-12-22 DIAGNOSIS — Z79899 Other long term (current) drug therapy: Secondary | ICD-10-CM

## 2016-12-22 DIAGNOSIS — R197 Diarrhea, unspecified: Secondary | ICD-10-CM

## 2016-12-22 DIAGNOSIS — D472 Monoclonal gammopathy: Secondary | ICD-10-CM

## 2016-12-22 DIAGNOSIS — H409 Unspecified glaucoma: Secondary | ICD-10-CM

## 2016-12-22 LAB — COMPREHENSIVE METABOLIC PANEL
ALT: 11 U/L — ABNORMAL LOW (ref 17–63)
AST: 13 U/L — ABNORMAL LOW (ref 15–41)
Albumin: 3.3 g/dL — ABNORMAL LOW (ref 3.5–5.0)
Alkaline Phosphatase: 47 U/L (ref 38–126)
Anion gap: 3 — ABNORMAL LOW (ref 5–15)
BUN: 23 mg/dL — ABNORMAL HIGH (ref 6–20)
CO2: 30 mmol/L (ref 22–32)
Calcium: 8.7 mg/dL — ABNORMAL LOW (ref 8.9–10.3)
Chloride: 102 mmol/L (ref 101–111)
Creatinine, Ser: 0.93 mg/dL (ref 0.61–1.24)
GFR calc Af Amer: >60 mL/min (ref >60)
GFR calc non Af Amer: >60 mL/min (ref >60)
Glucose, Bld: 283 mg/dL — ABNORMAL HIGH (ref 65–99)
Potassium: 3.7 mmol/L (ref 3.5–5.1)
Sodium: 135 mmol/L (ref 135–145)
Total Bilirubin: 0.5 mg/dL (ref 0.3–1.2)
Total Protein: 6.8 g/dL (ref 6.5–8.1)

## 2016-12-22 LAB — CBC WITH DIFFERENTIAL/PLATELET
Basophils Absolute: 0 10*3/uL (ref 0–0.1)
Basophils Relative: 0 %
Eosinophils Absolute: 0.1 10*3/uL (ref 0–0.7)
Eosinophils Relative: 1 %
HCT: 36.9 % — ABNORMAL LOW (ref 40.0–52.0)
Hemoglobin: 12.4 g/dL — ABNORMAL LOW (ref 13.0–18.0)
Lymphocytes Relative: 15 %
Lymphs Abs: 1.3 10*3/uL (ref 1.0–3.6)
MCH: 32 pg (ref 26.0–34.0)
MCHC: 33.7 g/dL (ref 32.0–36.0)
MCV: 95.2 fL (ref 80.0–100.0)
Monocytes Absolute: 1 10*3/uL (ref 0.2–1.0)
Monocytes Relative: 12 %
Neutro Abs: 5.8 10*3/uL (ref 1.4–6.5)
Neutrophils Relative %: 72 %
Platelets: 151 10*3/uL (ref 150–440)
RBC: 3.88 MIL/uL — ABNORMAL LOW (ref 4.40–5.90)
RDW: 14.1 % (ref 11.5–14.5)
WBC: 8.2 10*3/uL (ref 3.8–10.6)

## 2016-12-22 MED ORDER — DIPHENHYDRAMINE HCL 50 MG/ML IJ SOLN
50.0000 mg | Freq: Once | INTRAMUSCULAR | Status: AC
  Administered 2016-12-22: 50 mg via INTRAVENOUS
  Filled 2016-12-22: qty 1

## 2016-12-22 MED ORDER — SODIUM CHLORIDE 0.9 % IV SOLN
375.0000 mg/m2 | Freq: Once | INTRAVENOUS | Status: AC
  Administered 2016-12-22: 800 mg via INTRAVENOUS
  Filled 2016-12-22: qty 50

## 2016-12-22 MED ORDER — ONDANSETRON HCL 4 MG PO TABS
8.0000 mg | ORAL_TABLET | Freq: Once | ORAL | Status: AC
  Administered 2016-12-22: 8 mg via ORAL
  Filled 2016-12-22: qty 2

## 2016-12-22 MED ORDER — ACETAMINOPHEN 325 MG PO TABS
650.0000 mg | ORAL_TABLET | Freq: Once | ORAL | Status: AC
  Administered 2016-12-22: 650 mg via ORAL
  Filled 2016-12-22: qty 2

## 2016-12-22 MED ORDER — BORTEZOMIB CHEMO SQ INJECTION 3.5 MG (2.5MG/ML)
1.3000 mg/m2 | Freq: Once | INTRAMUSCULAR | Status: AC
  Administered 2016-12-22: 2.75 mg via SUBCUTANEOUS
  Filled 2016-12-22: qty 2.75

## 2016-12-22 MED ORDER — SODIUM CHLORIDE 0.9 % IV SOLN: 375.0000 mg/m2 | Freq: Once | INTRAVENOUS | Status: DC

## 2016-12-22 MED ORDER — SODIUM CHLORIDE 0.9 % IV SOLN
Freq: Once | INTRAVENOUS | Status: AC
  Administered 2016-12-22: 10:00:00 via INTRAVENOUS
  Filled 2016-12-22: qty 1000

## 2016-12-22 NOTE — Progress Notes (Signed)
[facility]  Clinic day:  12/22/2016  Chief Complaint: Adrian Valdez is a 66 y.o. male with Waldenstrom's macroglobulinemia who is seen for assessment prior day 11 of cycle #3 Velcade, Decadron, and Rituxan.  HPI:  The patient was last seen in the medical oncology clinic on 12/12/2016.  At that time, he had ongoing issues with elevated blood sugars up. He had a 1 day history of diarrhea. He had a new right-sided facial rash.  He had a right lower extremity edema.  Duplex study revealed no evidence of DVT.  He began cycle #3 Velcade, Decadron and Rituxan.  During the interim, he has been "fine, I think".  He did not turn in a stool sample as his diarrhea resolved.  He has 2 formed bowel movements a day. He continues to have issues with his blood sugar.  He has a rash on his abdomen at the sites of his Velcade.   Past Medical History:  Diagnosis Date  . 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 B12 deficiency 07/27/2015  . Dementia   . Enlarged prostate   . Glaucoma   . Glaucoma   . Hypertension   . Urinary frequency   . Vitamin B 12 deficiency     Past Surgical History:  Procedure Laterality Date  . KNEE ARTHROSCOPY Right   . LASIK      Family History  Problem Relation Age of Onset  . Hypertension Mother   . Hypertension Father   . CAD Father   . Breast cancer Maternal Grandmother   . Ovarian cancer Paternal Grandmother   . Kidney disease Neg Hx   . Prostate cancer Neg Hx     Social History:  reports that he has never smoked. He has never used smokeless tobacco. He reports that he drinks alcohol. He reports that he does not use drugs.  He notes that he has 6 PhDs in fine arts, music, etc from the [employer] [place], [address], [place], and [employer].  He lives with his parents.  The patient is accompanied by his sister-in-law today.  Allergies:  Allergies  Allergen Reactions  . Oxybutynin Other (See Comments)     Headache  . Myrbetriq [Mirabegron] Other (See Comments)    Other reaction(s): Other (See Comments) migraine migraine     Current Medications: Current Outpatient Prescriptions  Medication Sig Dispense Refill  . acyclovir (ZOVIRAX) 400 MG tablet Take 1 tablet (400 mg total) by mouth 2 (two) times daily. 60 tablet 3  . dexamethasone (DECADRON) 4 MG tablet Take 5 tablets in the morning prior to chemotherapy (day 1,4,8, and 11). 20 tablet 3  . donepezil (ARICEPT) 5 MG tablet Take 5 mg by mouth at bedtime.     Marland Kitchen erythromycin ophthalmic ointment 1 application at bedtime.    . finasteride (PROSCAR) 5 MG tablet Take 5 mg by mouth daily. Reported on 11/30/2015    . lisinopril (PRINIVIL,ZESTRIL) 10 MG tablet Take by mouth. Reported on 11/30/2015    . metFORMIN (GLUMETZA) 500 MG (MOD) 24 hr tablet Take 500 mg by mouth. Take 2 tablets (1000 mg) by mouth daily with dinner.    . methazolamide (NEPTAZANE) 50 MG tablet TK 1 T PO BID  1  . nystatin (MYCOSTATIN) 100000 UNIT/ML suspension Take 5 mLs (500,000 Units total) by mouth 4 (four) times daily. 200 mL 1  . ondansetron (ZOFRAN) 8 MG tablet Take 1 tablet (8 mg total) by mouth 2 (two) times daily  as needed (Nausea or vomiting). 30 tablet 1  . tamsulosin (FLOMAX) 0.4 MG CAPS capsule TAKE ONE CAPSULE BY MOUTH ONCE DAILY, 30 MINUTES AFTER THE SAME MEAL EACH DAY    . timolol (BETIMOL) 0.5 % ophthalmic solution Apply to eye. Reported on 11/30/2015    . Travoprost, BAK Free, (TRAVATAN) 0.004 % SOLN ophthalmic solution 1 drop at bedtime.     No current facility-administered medications for this visit.     Review of Systems:  GENERAL:  Feels "fine".  No fevers or sweats.  Weight up 8 pounds. PERFORMANCE STATUS (ECOG):  2 HEENT:  Vision cloudy secondary to glaucoma (stable).  No runny nose, sore throat, mouth sores or tenderness. Lungs: No shortness of breath or cough.  No hemoptysis. Cardiac:  No chest pain, palpitations, orthopnea, or PND. GI:  Appetite  good. Two formed bowel movements/day.  No nausea, diarrhea, constipation, melena or hematochezia. GU:  Enlarged prostate.  No urgency, frequency, dysuria, or hematuria. Musculoskeletal:  No back pain.  Right knee issues.  No muscle tenderness. Extremities:  No pain or swelling. Skin:  Right sided facial rash.  Abdominal rash s/p Velcade injections. Neuro:  Trouble thinking (chronic).  No headache, numbness or weakness, balance or coordination issues. Endocrine:  Elevated blood sugar on Metformin.  No thyroid issues, hot flashes or night sweats. Psych:  Poor sleep.  No mood changes, depression or anxiety. Pain:  No focal pain. Review of systems:  All other systems reviewed and found to be negative.  Physical Exam: BP 136/90 (BP Location: Left Arm, Patient Position: Sitting)   Pulse 76   Temp 97.4 F (36.3 C) (Tympanic)   Resp 18   Wt 229 lb 9 oz (104.1 kg)   BMI 32.02 kg/m  GENERAL:  Well developed, well nourished, gentleman sitting comfortably in the exam room. MENTAL STATUS:  Alert and oriented to person and place. Unsure of date today.  HEAD:  Pearline Cables hair.  Male pattern baldness.  Normocephalic, atraumatic, face symmetric, no Cushingoid features. EYES:  Waring dark glasses.  Pupils equal round and reactive to light and accomodation.  No conjunctivitis or scleral icterus. ENT:  No oral lesions.  Tongue normal. Mucous membranes moist.  RESPIRATORY:  Clear to auscultation without rales, wheezes or rhonchi. CARDIOVASCULAR:  Regular rate and rhythm without murmur, rub or gallop. ABDOMEN:  Soft, non-tender, with active bowel sounds, and no hepatosplenomegaly.  No masses. SKIN:  Right sided slightly brown facial rash.  Ecchymosis s/p Velcade injections.  No vesicles.  No ulcers or lesions. EXTREMITIES:  Chronic 1+ right lower extremity edema.  No skin discoloration or tenderness.  No palpable cords. LYMPH NODES: No palpable cervical, supraclavicular, axillary or inguinal adenopathy   NEUROLOGICAL:  No change. PSYCH:  Flat affect. Conversant.   Appointment on 12/22/2016  Component Date Value Ref Range Status  . Sodium 12/22/2016 135  135 - 145 mmol/L Final  . Potassium 12/22/2016 3.7  3.5 - 5.1 mmol/L Final  . Chloride 12/22/2016 102  101 - 111 mmol/L Final  . CO2 12/22/2016 30  22 - 32 mmol/L Final  . Glucose, Bld 12/22/2016 283* 65 - 99 mg/dL Final  . BUN 12/22/2016 23* 6 - 20 mg/dL Final  . Creatinine, Ser 12/22/2016 0.93  0.61 - 1.24 mg/dL Final  . Calcium 12/22/2016 8.7* 8.9 - 10.3 mg/dL Final  . Total Protein 12/22/2016 6.8  6.5 - 8.1 g/dL Final  . Albumin 12/22/2016 3.3* 3.5 - 5.0 g/dL Final  . AST 12/22/2016 13* 15 -  41 U/L Final  . ALT 12/22/2016 11* 17 - 63 U/L Final  . Alkaline Phosphatase 12/22/2016 47  38 - 126 U/L Final  . Total Bilirubin 12/22/2016 0.5  0.3 - 1.2 mg/dL Final  . GFR calc non Af Amer 12/22/2016 >60  >60 mL/min Final  . GFR calc Af Amer 12/22/2016 >60  >60 mL/min Final   Comment: (NOTE) The eGFR has been calculated using the CKD EPI equation. This calculation has not been validated in all clinical situations. eGFR's persistently <60 mL/min signify possible Chronic Kidney Disease.   . Anion gap 12/22/2016 3* 5 - 15 Final  . WBC 12/22/2016 8.2  3.8 - 10.6 K/uL Final  . RBC 12/22/2016 3.88* 4.40 - 5.90 MIL/uL Final  . Hemoglobin 12/22/2016 12.4* 13.0 - 18.0 g/dL Final  . HCT 12/22/2016 36.9* 40.0 - 52.0 % Final  . MCV 12/22/2016 95.2  80.0 - 100.0 fL Final  . MCH 12/22/2016 32.0  26.0 - 34.0 pg Final  . MCHC 12/22/2016 33.7  32.0 - 36.0 g/dL Final  . RDW 12/22/2016 14.1  11.5 - 14.5 % Final  . Platelets 12/22/2016 151  150 - 440 K/uL Final  . Neutrophils Relative % 12/22/2016 72  % Final  . Neutro Abs 12/22/2016 5.8  1.4 - 6.5 K/uL Final  . Lymphocytes Relative 12/22/2016 15  % Final  . Lymphs Abs 12/22/2016 1.3  1.0 - 3.6 K/uL Final  . Monocytes Relative 12/22/2016 12  % Final  . Monocytes Absolute 12/22/2016 1.0  0.2 - 1.0  K/uL Final  . Eosinophils Relative 12/22/2016 1  % Final  . Eosinophils Absolute 12/22/2016 0.1  0 - 0.7 K/uL Final  . Basophils Relative 12/22/2016 0  % Final  . Basophils Absolute 12/22/2016 0.0  0 - 0.1 K/uL Final    Assessment:  Adrian Valdez is a 67 y.o. 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  Work-up on 08/15/2016 revealed an IgM of 3808.  Serum viscosity was 2.5 (1.6-1.9).  Beta2-microglobulin was 2.5 (0.6-2.4).  Negative studies included:  hepatitis B surface antigen, hepatitis B core antibody, hepatitis C antibody, LDH, and CRP.   Bone marrow biopsy on 08/23/2016 revealed a mature B-cell lymphoma with plasmacytic differentiation, predominantly paratrabecular infiltrate of lymphoplasmacytic infiltrate accounts for 10-15% of marrow space involvement.  Marrow was normocellular for age (50-60%) with trilineage hematopoiesis.  There was mild to moderate paratrabecular increase in reticulin fibers associated with lymphoplasmacytic infiltrate.  Congo red stain was negative for amyloid.  Storage iron was present.    Bone marrow flow cytometry revealed a monotypic B-cell population (0.6% of sample) with a non-specific immunophenotype.  There was abnormal/monotypic plasma cell population (< 0.1% of sample).  Cytogenetics were normal (46, XY).  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  Bone survey on 08/18/2016 revealed no evidence of bony metastatic disease.  There are no CRAB criteria (hypercalciumia, renal insufficiency, anemia or bone lesions).  Concern is raised for possible contribution of Waldenstrom's to decline in performance status (mentation).  Serum viscosity is mildly elevated.  He has no palpable adenopathy or hepatosplenomegaly.   SPEP has been monitored:  1.6 gm/dL on 07/09/2015, 2.4 on 07/24/2015, 2.8 on  08/11/2016, 2.0 on 10/31/2016, and 1.8 on 11/21/2016.  IgM has been monitored:  3808 on 08/15/2016, 3531 on 10/25/2016, and 2757 on 11/21/2016.  Kappa free light chains have been monitored:  56.65 (ratio of 5.27) on 07/24/2015 and 70.3 (ratio 6.28) on 08/08/2016.  Chest, abdomen and pelvic CT scan on 08/12/2015 revealed no radiolucent bone lesions. There was no mediastinal or hilar adenopathy.  The ascending aorta measured 4.2 cm in diameter.  Chest, abdomen, and pelvic CT scan on 09/12/2016 revealed no adenopathy or hepatosplenomegaly.  There was mild fusiform enlargement of the ascending aorta (4.2 x 4.1 cm).  There were numerous hepatic cysts, colonic diverticulosis, an enlarged prostate with partial bladder outlet obstruction, and bilateral inguinal hernias.  Head MRI on 09/08/2016 revealed no acute or reversible findings.  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  There was mild to moderate cerebral atrophy.  EEG on 09/27/2016 revealed right temporal epileptiform discharge and left temporal focal slowing.  Paraneoplastic panel was negative.  He has not had an LP.  He has B12 deficiency on supplementation.  B12 was 154 on 07/24/2015 with an elevated MMA.  Folate was 31 and B12 was 246 (low normal) on 08/15/2016.  MMA was 226 (normal) on 08/30/2016.  He receives B12 monthly (last 12/02/2016).  He is day 11 of cycle #3 Velcade, Decadron, and Rituxan (10/31/2016 - 12/12/2016).  He is tolerating treatment well.  Symptomatically, his blood sugar remains elevated.  Exam reveals no adenopathy or hepatosplenomegaly.   Plan: 1.  Labs today:  CBC with diff, CMP, Mg, SPEP, IgM. 2.  Day 11 of cycle #3 Velcade and Rituxan today. 3.  Nurse to contact Dr. Tonette Bihari office re: blood sugar 4.  Continue monthly B12 (next due 12/30/2016). 5.  RTC on 01/02/2017 for MD assessment, labs (CBC with diff, CMP, SPEP, IgM, Mg), and day 1 of cycle #4 Velcade   Lequita Asal, MD   12/22/2016, 9:02 AM

## 2016-12-22 NOTE — Progress Notes (Signed)
Patient offers no complaints. 

## 2016-12-22 NOTE — Telephone Encounter (Signed)
Message left for Dr. Tonette Bihari nurse that the patients blood glucose was elevated to 283 with thee use of steroids in his treatment plan.

## 2017-01-01 ENCOUNTER — Other Ambulatory Visit: Payer: Self-pay | Admitting: Hematology and Oncology

## 2017-01-01 NOTE — Progress Notes (Signed)
[facility] day:  01/02/2017  Chief Complaint: Adrian Valdez is a 66 y.o. male with Waldenstrom's macroglobulinemia who is seen for assessment prior day 1 of cycle #4 Velcade, Decadron, and Rituxan.  HPI:  The patient was last seen in the medical oncology clinic on 12/22/2016.  At that time, he denied any complaint.  Blood sugar remained elevated.  He received day 11 of cycle #3 Velcade and Rituxan.  During the interim, he denies any new complaints.  He has had some urinary symptoms (increased frequency, burning, urgency and lower abdominal discomfort).  His thinking may be a little better.   Past Medical History:  Diagnosis Date  . Ascending aorta enlargement ([place]) 08/18/2015  . B12 deficiency 07/27/2015  . Dementia   . Enlarged prostate   . Glaucoma   . Glaucoma   . Hypertension   . Urinary frequency   . Vitamin B 12 deficiency     Past Surgical History:  Procedure Laterality Date  . KNEE ARTHROSCOPY Right   . LASIK      Family History  Problem Relation Age of Onset  . Hypertension Mother   . Hypertension Father   . CAD Father   . Breast cancer Maternal Grandmother   . Ovarian cancer Paternal Grandmother   . Kidney disease Neg Hx   . Prostate cancer Neg Hx     Social History:  reports that he has never smoked. He has never used smokeless tobacco. He reports that he drinks alcohol. He reports that he does not use drugs.  He notes that he has 6 PhDs in fine arts, music, etc from the [employer].  He lives with his parents.  The patient is accompanied by his sister-in-law today.  Allergies:  Allergies  Allergen Reactions  . Oxybutynin Other (See Comments)    Headache  . Myrbetriq [Mirabegron] Other (See Comments)    Other reaction(s): Other (See Comments) migraine migraine     Current Medications: Current Outpatient Prescriptions  Medication Sig Dispense Refill  .  acyclovir (ZOVIRAX) 400 MG tablet Take 1 tablet (400 mg total) by mouth 2 (two) times daily. 60 tablet 3  . dexamethasone (DECADRON) 4 MG tablet Take 5 tablets in the morning prior to chemotherapy (day 1,4,8, and 11). 20 tablet 3  . finasteride (PROSCAR) 5 MG tablet Take 5 mg by mouth daily. Reported on 11/30/2015    . lisinopril (PRINIVIL,ZESTRIL) 10 MG tablet Take by mouth. Reported on 11/30/2015    . metFORMIN (GLUMETZA) 500 MG (MOD) 24 hr tablet Take 500 mg by mouth. Take 2 tablets (1000 mg) by mouth daily with dinner.    . tamsulosin (FLOMAX) 0.4 MG CAPS capsule TAKE ONE CAPSULE BY MOUTH ONCE DAILY, 30 MINUTES AFTER THE SAME MEAL EACH DAY    . timolol (BETIMOL) 0.5 % ophthalmic solution Apply to eye. Reported on 11/30/2015    . Travoprost, BAK Free, (TRAVATAN) 0.004 % SOLN ophthalmic solution 1 drop at bedtime.    . donepezil (ARICEPT) 5 MG tablet Take 5 mg by mouth at bedtime.     Marland Kitchen erythromycin ophthalmic ointment 1 application at bedtime.    . methazolamide (NEPTAZANE) 50 MG tablet TK 1 T PO BID  1  . nystatin (MYCOSTATIN) 100000 UNIT/ML suspension Take 5 mLs (500,000 Units total) by mouth 4 (four) times daily. (Patient not taking: Reported on 01/02/2017) 200 mL 1  . ondansetron (ZOFRAN) 8 MG tablet  Take 1 tablet (8 mg total) by mouth 2 (two) times daily as needed (Nausea or vomiting). (Patient not taking: Reported on 01/02/2017) 30 tablet 1   No current facility-administered medications for this visit.     Review of Systems:  GENERAL:  Feels "ok".  No fevers or sweats.  Weight down 1 pound. PERFORMANCE STATUS (ECOG):  2 HEENT:  Vision cloudy secondary to glaucoma (stable).  No runny nose, sore throat, mouth sores or tenderness. Lungs: No shortness of breath or cough.  No hemoptysis. Cardiac:  No chest pain, palpitations, orthopnea, or PND. GI:  Appetite good. Two formed bowel movements/day.  No nausea, diarrhea, constipation, melena or hematochezia. GU:  Enlarged prostate.  Urgency,  frequency, burning.  No hematuria. Musculoskeletal:  No back pain.  Right knee issues.  No muscle tenderness. Extremities:  No pain or swelling. Skin:  Right sided facial rash, resolved.  Abdominal bruising  s/p Velcade injections. Neuro:  Trouble thinking (improved).  No headache, numbness or weakness, balance or coordination issues. Endocrine:  Elevated blood sugar on Metformin.  No thyroid issues, hot flashes or night sweats. Psych:  Poor sleep.  No mood changes, depression or anxiety. Pain:  No focal pain. Review of systems:  All other systems reviewed and found to be negative.  Physical Exam: BP (!) 142/75 (BP Location: Right Arm, Patient Position: Sitting)   Pulse 77   Temp 97.4 F (36.3 C) (Tympanic)   Resp 16   Wt 230 lb 14.4 oz (104.7 kg)   BMI 32.20 kg/m  GENERAL:  Well developed, well nourished, gentleman sitting comfortably in the exam room. MENTAL STATUS:  Alert and oriented to person and place. Believes it is 1997 today.  HEAD:  Pearline Cables hair.  Male pattern baldness.  Normocephalic, atraumatic, face symmetric, no Cushingoid features. EYES:  Waring dark glasses.  Pupils equal round and reactive to light and accomodation.  No conjunctivitis or scleral icterus. ENT:  No oral lesions.  Tongue normal. Mucous membranes moist.  RESPIRATORY:  Clear to auscultation without rales, wheezes or rhonchi. CARDIOVASCULAR:  Regular rate and rhythm without murmur, rub or gallop. ABDOMEN:  Soft, non-tender, with active bowel sounds, and no hepatosplenomegaly.  No masses. SKIN:  Right sided slightly brown facial rash.  Ecchymosis s/p Velcade injections.  No vesicles.  No ulcers or lesions. EXTREMITIES:  Chronic 1+ right lower extremity edema.  No skin discoloration or tenderness.  No palpable cords. LYMPH NODES: No palpable cervical, supraclavicular, axillary or inguinal adenopathy  NEUROLOGICAL:  Knows the current president and past 2 president.  Knows current and past talk show hosts. Unknown  season. Knows 4 quarters in a dollar. Unsure of how many nickels are in a quarter.  Remembers 3 of 3 objects immediately and 2 of 3 in 5 minutes with prompting. PSYCH:  Flat affect. Conversant.   Appointment on 01/02/2017  Component Date Value Ref Range Status  . WBC 01/02/2017 8.2  3.8 - 10.6 K/uL Final  . RBC 01/02/2017 3.99* 4.40 - 5.90 MIL/uL Final  . Hemoglobin 01/02/2017 12.7* 13.0 - 18.0 g/dL Final  . HCT 01/02/2017 37.4* 40.0 - 52.0 % Final  . MCV 01/02/2017 93.7  80.0 - 100.0 fL Final  . MCH 01/02/2017 31.9  26.0 - 34.0 pg Final  . MCHC 01/02/2017 34.0  32.0 - 36.0 g/dL Final  . RDW 01/02/2017 13.8  11.5 - 14.5 % Final  . Platelets 01/02/2017 243  150 - 440 K/uL Final  . Neutrophils Relative % 01/02/2017 71  %  Final  . Neutro Abs 01/02/2017 5.8  1.4 - 6.5 K/uL Final  . Lymphocytes Relative 01/02/2017 17  % Final  . Lymphs Abs 01/02/2017 1.4  1.0 - 3.6 K/uL Final  . Monocytes Relative 01/02/2017 10  % Final  . Monocytes Absolute 01/02/2017 0.8  0.2 - 1.0 K/uL Final  . Eosinophils Relative 01/02/2017 1  % Final  . Eosinophils Absolute 01/02/2017 0.1  0 - 0.7 K/uL Final  . Basophils Relative 01/02/2017 1  % Final  . Basophils Absolute 01/02/2017 0.1  0 - 0.1 K/uL Final  . Sodium 01/02/2017 135  135 - 145 mmol/L Final  . Potassium 01/02/2017 3.6  3.5 - 5.1 mmol/L Final  . Chloride 01/02/2017 102  101 - 111 mmol/L Final  . CO2 01/02/2017 27  22 - 32 mmol/L Final  . Glucose, Bld 01/02/2017 217* 65 - 99 mg/dL Final  . BUN 01/02/2017 14  6 - 20 mg/dL Final  . Creatinine, Ser 01/02/2017 0.78  0.61 - 1.24 mg/dL Final  . Calcium 01/02/2017 8.9  8.9 - 10.3 mg/dL Final  . Total Protein 01/02/2017 7.0  6.5 - 8.1 g/dL Final  . Albumin 01/02/2017 3.5  3.5 - 5.0 g/dL Final  . AST 01/02/2017 17  15 - 41 U/L Final  . ALT 01/02/2017 13* 17 - 63 U/L Final  . Alkaline Phosphatase 01/02/2017 63  38 - 126 U/L Final  . Total Bilirubin 01/02/2017 0.5  0.3 - 1.2 mg/dL Final  . GFR calc non Af  Amer 01/02/2017 >60  >60 mL/min Final  . GFR calc Af Amer 01/02/2017 >60  >60 mL/min Final   Comment: (NOTE) The eGFR has been calculated using the CKD EPI equation. This calculation has not been validated in all clinical situations. eGFR's persistently <60 mL/min signify possible Chronic Kidney Disease.   . Anion gap 01/02/2017 6  5 - 15 Final  . Magnesium 01/02/2017 1.9  1.7 - 2.4 mg/dL Final    Assessment:  Athol Sahith Nurse is a 66 y.o. 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  Work-up on 08/15/2016 revealed an IgM of 3808.  Serum viscosity was 2.5 (1.6-1.9).  Beta2-microglobulin was 2.5 (0.6-2.4).  Negative studies included:  hepatitis B surface antigen, hepatitis B core antibody, hepatitis C antibody, LDH, and CRP.   Bone marrow biopsy on 08/23/2016 revealed a mature B-cell lymphoma with plasmacytic differentiation, predominantly paratrabecular infiltrate of lymphoplasmacytic infiltrate accounts for 10-15% of marrow space involvement.  Marrow was normocellular for age (50-60%) with trilineage hematopoiesis.  There was mild to moderate paratrabecular increase in reticulin fibers associated with lymphoplasmacytic infiltrate.  Congo red stain was negative for amyloid.  Storage iron was present.    Bone marrow flow cytometry revealed a monotypic B-cell population (0.6% of sample) with a non-specific immunophenotype.  There was abnormal/monotypic plasma cell population (< 0.1% of sample).  Cytogenetics were normal (46, XY).  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  Bone survey on 08/18/2016 revealed no evidence of bony metastatic disease.  There are no CRAB criteria (hypercalciumia, renal insufficiency, anemia or bone lesions).  Concern is raised for possible contribution of Waldenstrom's to decline in performance status  (mentation).  Serum viscosity is mildly elevated.  He has no palpable adenopathy or hepatosplenomegaly.   SPEP has been monitored:  1.6 gm/dL on 07/09/2015, 2.4 on 07/24/2015, 2.8 on 08/11/2016, 2.0 on 10/31/2016, and 1.8  on 11/21/2016.  IgM has been monitored:  3808 on 08/15/2016, 3531 on 10/25/2016, and 2757 on 11/21/2016.  Kappa free light chains have been monitored:  56.65 (ratio of 5.27) on 07/24/2015 and 70.3 (ratio 6.28) on 08/08/2016.  Chest, abdomen and pelvic CT scan on 08/12/2015 revealed no radiolucent bone lesions. There was no mediastinal or hilar adenopathy.  The ascending aorta measured 4.2 cm in diameter.  Chest, abdomen, and pelvic CT scan on 09/12/2016 revealed no adenopathy or hepatosplenomegaly.  There was mild fusiform enlargement of the ascending aorta (4.2 x 4.1 cm).  There were numerous hepatic cysts, colonic diverticulosis, an enlarged prostate with partial bladder outlet obstruction, and bilateral inguinal hernias.  Head MRI on 09/08/2016 revealed no acute or reversible findings.  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  There was mild to moderate cerebral atrophy.  EEG on 09/27/2016 revealed right temporal epileptiform discharge and left temporal focal slowing.  Paraneoplastic panel was negative.  He has not had an LP.  He has B12 deficiency on supplementation.  B12 was 154 on 07/24/2015 with an elevated MMA.  Folate was 31 and B12 was 246 (low normal) on 08/15/2016.  MMA was 226 (normal) on 08/30/2016.  He receives B12 monthly (last 12/02/2016).  He is day 1 of cycle #4 Velcade, Decadron, and Rituxan (10/31/2016 - 01/02/2017).  He is tolerating treatment well.  Symptomatically, he has urgency, frequency, and dysuria.  Memory is slightly improved.  Exam reveals no adenopathy or hepatosplenomegaly.   Plan: 1.  Labs today:  CBC with diff, CMP, Mg, SPEP, IgM. 2.  Day 1 of cycle #4 Velcade today. 3.  B12 today and monthly. 4.  Urinalysis and  culture. 5.  Discuss the plan for maintenance chemotherapy after this cycle.  Begin 12 weeks after this cycle.  At that time, continue current regimen every 3 months. 6.  RTC on 01/05/2017 and 01/09/2017 for labs (CBC with diff, CMP) and Velcade. 7.  RTC on 01/12/2017 for labs (CBC with diff, CMP) and Velcade + Rituxan. 8.  RTC on 01/31/2017 for MD assessment and labs (CBC with diff, CMP, SPEP, IgM) 9.  RTC in 3 months for MD assessment, labs (CBC with diff, CMP, SPEP, IgM) and cycle #1 maintenance RVD.  Addendum:  Urinalysis reveals no evidence of infection.  Urine reveals excess glucose (> 500 mg/dL).   Lequita Asal, MD  01/02/2017, 2:31 PM

## 2017-01-02 ENCOUNTER — Inpatient Hospital Stay: Payer: BLUE CROSS/BLUE SHIELD

## 2017-01-02 ENCOUNTER — Encounter: Payer: Self-pay | Admitting: Hematology and Oncology

## 2017-01-02 ENCOUNTER — Inpatient Hospital Stay (HOSPITAL_BASED_OUTPATIENT_CLINIC_OR_DEPARTMENT_OTHER): Payer: BLUE CROSS/BLUE SHIELD | Admitting: Hematology and Oncology

## 2017-01-02 ENCOUNTER — Other Ambulatory Visit: Payer: Self-pay

## 2017-01-02 ENCOUNTER — Other Ambulatory Visit: Payer: Self-pay | Admitting: *Deleted

## 2017-01-02 VITALS — BP 142/75 | HR 77 | Temp 97.4°F | Resp 16 | Wt 230.9 lb

## 2017-01-02 DIAGNOSIS — C880 Waldenstrom macroglobulinemia: Secondary | ICD-10-CM

## 2017-01-02 DIAGNOSIS — D472 Monoclonal gammopathy: Secondary | ICD-10-CM | POA: Diagnosis not present

## 2017-01-02 DIAGNOSIS — Z5112 Encounter for antineoplastic immunotherapy: Secondary | ICD-10-CM

## 2017-01-02 DIAGNOSIS — E538 Deficiency of other specified B group vitamins: Secondary | ICD-10-CM | POA: Diagnosis not present

## 2017-01-02 DIAGNOSIS — R350 Frequency of micturition: Secondary | ICD-10-CM

## 2017-01-02 DIAGNOSIS — R1030 Lower abdominal pain, unspecified: Secondary | ICD-10-CM

## 2017-01-02 DIAGNOSIS — N400 Enlarged prostate without lower urinary tract symptoms: Secondary | ICD-10-CM

## 2017-01-02 DIAGNOSIS — R739 Hyperglycemia, unspecified: Secondary | ICD-10-CM

## 2017-01-02 DIAGNOSIS — Z803 Family history of malignant neoplasm of breast: Secondary | ICD-10-CM

## 2017-01-02 DIAGNOSIS — Z5111 Encounter for antineoplastic chemotherapy: Secondary | ICD-10-CM

## 2017-01-02 DIAGNOSIS — H409 Unspecified glaucoma: Secondary | ICD-10-CM

## 2017-01-02 DIAGNOSIS — R600 Localized edema: Secondary | ICD-10-CM

## 2017-01-02 DIAGNOSIS — Z7984 Long term (current) use of oral hypoglycemic drugs: Secondary | ICD-10-CM

## 2017-01-02 DIAGNOSIS — R3915 Urgency of urination: Secondary | ICD-10-CM

## 2017-01-02 DIAGNOSIS — I10 Essential (primary) hypertension: Secondary | ICD-10-CM | POA: Diagnosis not present

## 2017-01-02 DIAGNOSIS — Z8673 Personal history of transient ischemic attack (TIA), and cerebral infarction without residual deficits: Secondary | ICD-10-CM

## 2017-01-02 DIAGNOSIS — R21 Rash and other nonspecific skin eruption: Secondary | ICD-10-CM

## 2017-01-02 DIAGNOSIS — Z8041 Family history of malignant neoplasm of ovary: Secondary | ICD-10-CM

## 2017-01-02 DIAGNOSIS — F0390 Unspecified dementia without behavioral disturbance: Secondary | ICD-10-CM

## 2017-01-02 DIAGNOSIS — Z79899 Other long term (current) drug therapy: Secondary | ICD-10-CM

## 2017-01-02 DIAGNOSIS — R197 Diarrhea, unspecified: Secondary | ICD-10-CM

## 2017-01-02 LAB — CBC WITH DIFFERENTIAL/PLATELET
Basophils Absolute: 0.1 10*3/uL (ref 0–0.1)
Basophils Relative: 1 %
Eosinophils Absolute: 0.1 10*3/uL (ref 0–0.7)
Eosinophils Relative: 1 %
HCT: 37.4 % — ABNORMAL LOW (ref 40.0–52.0)
Hemoglobin: 12.7 g/dL — ABNORMAL LOW (ref 13.0–18.0)
Lymphocytes Relative: 17 %
Lymphs Abs: 1.4 10*3/uL (ref 1.0–3.6)
MCH: 31.9 pg (ref 26.0–34.0)
MCHC: 34 g/dL (ref 32.0–36.0)
MCV: 93.7 fL (ref 80.0–100.0)
Monocytes Absolute: 0.8 10*3/uL (ref 0.2–1.0)
Monocytes Relative: 10 %
Neutro Abs: 5.8 10*3/uL (ref 1.4–6.5)
Neutrophils Relative %: 71 %
Platelets: 243 10*3/uL (ref 150–440)
RBC: 3.99 MIL/uL — ABNORMAL LOW (ref 4.40–5.90)
RDW: 13.8 % (ref 11.5–14.5)
WBC: 8.2 10*3/uL (ref 3.8–10.6)

## 2017-01-02 LAB — COMPREHENSIVE METABOLIC PANEL
ALT: 13 U/L — ABNORMAL LOW (ref 17–63)
AST: 17 U/L (ref 15–41)
Albumin: 3.5 g/dL (ref 3.5–5.0)
Alkaline Phosphatase: 63 U/L (ref 38–126)
Anion gap: 6 (ref 5–15)
BUN: 14 mg/dL (ref 6–20)
CO2: 27 mmol/L (ref 22–32)
Calcium: 8.9 mg/dL (ref 8.9–10.3)
Chloride: 102 mmol/L (ref 101–111)
Creatinine, Ser: 0.78 mg/dL (ref 0.61–1.24)
GFR calc Af Amer: >60 mL/min (ref >60)
GFR calc non Af Amer: >60 mL/min (ref >60)
Glucose, Bld: 217 mg/dL — ABNORMAL HIGH (ref 65–99)
Potassium: 3.6 mmol/L (ref 3.5–5.1)
Sodium: 135 mmol/L (ref 135–145)
Total Bilirubin: 0.5 mg/dL (ref 0.3–1.2)
Total Protein: 7 g/dL (ref 6.5–8.1)

## 2017-01-02 LAB — URINALYSIS, COMPLETE (UACMP) WITH MICROSCOPIC
Bilirubin Urine: NEGATIVE
Glucose, UA: >=500 mg/dL — AB
Hgb urine dipstick: NEGATIVE
Ketones, ur: NEGATIVE mg/dL
Leukocytes, UA: NEGATIVE
Nitrite: NEGATIVE
Protein, ur: NEGATIVE mg/dL
Specific Gravity, Urine: 1.016 (ref 1.005–1.030)
Squamous Epithelial / LPF: NONE SEEN
pH: 6 (ref 5.0–8.0)

## 2017-01-02 LAB — MAGNESIUM: Magnesium: 1.9 mg/dL (ref 1.7–2.4)

## 2017-01-02 MED ORDER — BORTEZOMIB CHEMO SQ INJECTION 3.5 MG (2.5MG/ML)
1.3000 mg/m2 | Freq: Once | INTRAMUSCULAR | Status: AC
  Administered 2017-01-02: 2.75 mg via SUBCUTANEOUS
  Filled 2017-01-02: qty 2.75

## 2017-01-02 MED ORDER — CYANOCOBALAMIN 1000 MCG/ML IJ SOLN
1000.0000 ug | Freq: Once | INTRAMUSCULAR | Status: AC
  Administered 2017-01-02: 1000 ug via INTRAMUSCULAR
  Filled 2017-01-02: qty 1

## 2017-01-02 MED ORDER — ONDANSETRON HCL 4 MG PO TABS
8.0000 mg | ORAL_TABLET | Freq: Once | ORAL | Status: AC
  Administered 2017-01-02: 8 mg via ORAL
  Filled 2017-01-02: qty 2

## 2017-01-02 NOTE — Progress Notes (Signed)
Patient complains of UTI symptoms x 1 day. Reports increased frequency, burning, urgency and pressure to lower abdomen area. Denies history of UTI's. Also reports stronger odor to urine.

## 2017-01-03 ENCOUNTER — Telehealth: Payer: Self-pay | Admitting: *Deleted

## 2017-01-03 LAB — PROTEIN ELECTROPHORESIS, SERUM
A/G Ratio: 1.1 (ref 0.7–1.7)
Albumin ELP: 3.5 g/dL (ref 2.9–4.4)
Alpha-1-Globulin: 0.2 g/dL (ref 0.0–0.4)
Alpha-2-Globulin: 0.7 g/dL (ref 0.4–1.0)
Beta Globulin: 0.8 g/dL (ref 0.7–1.3)
Gamma Globulin: 1.5 g/dL (ref 0.4–1.8)
Globulin, Total: 3.2 g/dL (ref 2.2–3.9)
M-Spike, %: 1.1 g/dL — ABNORMAL HIGH
Total Protein ELP: 6.7 g/dL (ref 6.0–8.5)

## 2017-01-03 LAB — IGM: IgM, Serum: 1740 mg/dL — ABNORMAL HIGH (ref 20–172)

## 2017-01-03 NOTE — Telephone Encounter (Signed)
Notified father.

## 2017-01-03 NOTE — Telephone Encounter (Signed)
  He had symptoms of a UTI yesterday.  UA did not reveal UTI- no antibiotics needed.  M

## 2017-01-03 NOTE — Telephone Encounter (Signed)
Called and spoke with father and informed him that the patients urine was without infection and that no antibiotics were needed at this time, voiced understanding.

## 2017-01-03 NOTE — Telephone Encounter (Signed)
Patient called and said he was supposed to have had a prescription sent to pharmacy yesterday.  States he went by pharmacy and they did not have anything.  Patient was told we would check on it and call him when sent.

## 2017-01-04 LAB — URINE CULTURE

## 2017-01-05 ENCOUNTER — Other Ambulatory Visit: Payer: Self-pay | Admitting: Hematology and Oncology

## 2017-01-05 ENCOUNTER — Inpatient Hospital Stay: Payer: BLUE CROSS/BLUE SHIELD

## 2017-01-05 ENCOUNTER — Inpatient Hospital Stay: Payer: BLUE CROSS/BLUE SHIELD | Attending: Hematology and Oncology

## 2017-01-05 VITALS — BP 165/99 | HR 87 | Temp 98.4°F | Resp 20

## 2017-01-05 DIAGNOSIS — C8800 Waldenstrom macroglobulinemia not having achieved remission: Secondary | ICD-10-CM

## 2017-01-05 DIAGNOSIS — I10 Essential (primary) hypertension: Secondary | ICD-10-CM | POA: Diagnosis not present

## 2017-01-05 DIAGNOSIS — Z7984 Long term (current) use of oral hypoglycemic drugs: Secondary | ICD-10-CM | POA: Insufficient documentation

## 2017-01-05 DIAGNOSIS — Z5112 Encounter for antineoplastic immunotherapy: Secondary | ICD-10-CM | POA: Diagnosis not present

## 2017-01-05 DIAGNOSIS — M7989 Other specified soft tissue disorders: Secondary | ICD-10-CM | POA: Diagnosis not present

## 2017-01-05 DIAGNOSIS — N400 Enlarged prostate without lower urinary tract symptoms: Secondary | ICD-10-CM | POA: Diagnosis not present

## 2017-01-05 DIAGNOSIS — D472 Monoclonal gammopathy: Secondary | ICD-10-CM | POA: Insufficient documentation

## 2017-01-05 DIAGNOSIS — F0390 Unspecified dementia without behavioral disturbance: Secondary | ICD-10-CM | POA: Diagnosis not present

## 2017-01-05 DIAGNOSIS — I358 Other nonrheumatic aortic valve disorders: Secondary | ICD-10-CM | POA: Insufficient documentation

## 2017-01-05 DIAGNOSIS — Z79899 Other long term (current) drug therapy: Secondary | ICD-10-CM | POA: Diagnosis not present

## 2017-01-05 DIAGNOSIS — E538 Deficiency of other specified B group vitamins: Secondary | ICD-10-CM | POA: Insufficient documentation

## 2017-01-05 DIAGNOSIS — Z8673 Personal history of transient ischemic attack (TIA), and cerebral infarction without residual deficits: Secondary | ICD-10-CM | POA: Insufficient documentation

## 2017-01-05 DIAGNOSIS — Z803 Family history of malignant neoplasm of breast: Secondary | ICD-10-CM | POA: Insufficient documentation

## 2017-01-05 DIAGNOSIS — Z853 Personal history of malignant neoplasm of breast: Secondary | ICD-10-CM | POA: Diagnosis not present

## 2017-01-05 LAB — COMPREHENSIVE METABOLIC PANEL
ALT: 13 U/L — ABNORMAL LOW (ref 17–63)
AST: 17 U/L (ref 15–41)
Albumin: 3.5 g/dL (ref 3.5–5.0)
Alkaline Phosphatase: 57 U/L (ref 38–126)
Anion gap: 7 (ref 5–15)
BUN: 21 mg/dL — ABNORMAL HIGH (ref 6–20)
CO2: 28 mmol/L (ref 22–32)
Calcium: 8.9 mg/dL (ref 8.9–10.3)
Chloride: 99 mmol/L — ABNORMAL LOW (ref 101–111)
Creatinine, Ser: 0.79 mg/dL (ref 0.61–1.24)
GFR calc Af Amer: >60 mL/min (ref >60)
GFR calc non Af Amer: >60 mL/min (ref >60)
Glucose, Bld: 247 mg/dL — ABNORMAL HIGH (ref 65–99)
Potassium: 3.7 mmol/L (ref 3.5–5.1)
Sodium: 134 mmol/L — ABNORMAL LOW (ref 135–145)
Total Bilirubin: 0.6 mg/dL (ref 0.3–1.2)
Total Protein: 6.7 g/dL (ref 6.5–8.1)

## 2017-01-05 LAB — CBC WITH DIFFERENTIAL/PLATELET
Basophils Absolute: 0 10*3/uL (ref 0–0.1)
Basophils Relative: 1 %
Eosinophils Absolute: 0.1 10*3/uL (ref 0–0.7)
Eosinophils Relative: 1 %
HCT: 38 % — ABNORMAL LOW (ref 40.0–52.0)
Hemoglobin: 12.7 g/dL — ABNORMAL LOW (ref 13.0–18.0)
Lymphocytes Relative: 14 %
Lymphs Abs: 1.3 10*3/uL (ref 1.0–3.6)
MCH: 31.6 pg (ref 26.0–34.0)
MCHC: 33.5 g/dL (ref 32.0–36.0)
MCV: 94.5 fL (ref 80.0–100.0)
Monocytes Absolute: 0.7 10*3/uL (ref 0.2–1.0)
Monocytes Relative: 7 %
Neutro Abs: 7.2 10*3/uL — ABNORMAL HIGH (ref 1.4–6.5)
Neutrophils Relative %: 77 %
Platelets: 236 10*3/uL (ref 150–440)
RBC: 4.02 MIL/uL — ABNORMAL LOW (ref 4.40–5.90)
RDW: 13.8 % (ref 11.5–14.5)
WBC: 9.3 10*3/uL (ref 3.8–10.6)

## 2017-01-05 MED ORDER — ONDANSETRON HCL 4 MG PO TABS
8.0000 mg | ORAL_TABLET | Freq: Once | ORAL | Status: AC
  Administered 2017-01-05: 8 mg via ORAL
  Filled 2017-01-05: qty 2

## 2017-01-05 MED ORDER — SODIUM CHLORIDE 0.9 % IV SOLN
Freq: Once | INTRAVENOUS | Status: DC
  Filled 2017-01-05: qty 1000

## 2017-01-05 MED ORDER — BORTEZOMIB CHEMO SQ INJECTION 3.5 MG (2.5MG/ML)
1.3000 mg/m2 | Freq: Once | INTRAMUSCULAR | Status: AC
  Administered 2017-01-05: 2.75 mg via SUBCUTANEOUS
  Filled 2017-01-05: qty 2.75

## 2017-01-09 ENCOUNTER — Inpatient Hospital Stay: Payer: BLUE CROSS/BLUE SHIELD

## 2017-01-09 VITALS — BP 153/95 | HR 71 | Temp 98.6°F | Resp 18

## 2017-01-09 DIAGNOSIS — C880 Waldenstrom macroglobulinemia: Secondary | ICD-10-CM

## 2017-01-09 LAB — CBC WITH DIFFERENTIAL/PLATELET
Basophils Absolute: 0 10*3/uL (ref 0–0.1)
Basophils Relative: 0 %
Eosinophils Absolute: 0.1 10*3/uL (ref 0–0.7)
Eosinophils Relative: 1 %
HCT: 38.3 % — ABNORMAL LOW (ref 40.0–52.0)
Hemoglobin: 13 g/dL (ref 13.0–18.0)
Lymphocytes Relative: 22 %
Lymphs Abs: 1.4 10*3/uL (ref 1.0–3.6)
MCH: 31.8 pg (ref 26.0–34.0)
MCHC: 33.9 g/dL (ref 32.0–36.0)
MCV: 93.8 fL (ref 80.0–100.0)
Monocytes Absolute: 0.7 10*3/uL (ref 0.2–1.0)
Monocytes Relative: 10 %
Neutro Abs: 4.2 10*3/uL (ref 1.4–6.5)
Neutrophils Relative %: 67 %
Platelets: 191 10*3/uL (ref 150–440)
RBC: 4.08 MIL/uL — ABNORMAL LOW (ref 4.40–5.90)
RDW: 13.5 % (ref 11.5–14.5)
WBC: 6.4 10*3/uL (ref 3.8–10.6)

## 2017-01-09 LAB — COMPREHENSIVE METABOLIC PANEL
ALT: 10 U/L — ABNORMAL LOW (ref 17–63)
AST: 13 U/L — ABNORMAL LOW (ref 15–41)
Albumin: 3.7 g/dL (ref 3.5–5.0)
Alkaline Phosphatase: 51 U/L (ref 38–126)
Anion gap: 6 (ref 5–15)
BUN: 17 mg/dL (ref 6–20)
CO2: 28 mmol/L (ref 22–32)
Calcium: 8.9 mg/dL (ref 8.9–10.3)
Chloride: 102 mmol/L (ref 101–111)
Creatinine, Ser: 0.88 mg/dL (ref 0.61–1.24)
GFR calc Af Amer: >60 mL/min (ref >60)
GFR calc non Af Amer: >60 mL/min (ref >60)
Glucose, Bld: 126 mg/dL — ABNORMAL HIGH (ref 65–99)
Potassium: 3.4 mmol/L — ABNORMAL LOW (ref 3.5–5.1)
Sodium: 136 mmol/L (ref 135–145)
Total Bilirubin: 0.7 mg/dL (ref 0.3–1.2)
Total Protein: 7.1 g/dL (ref 6.5–8.1)

## 2017-01-09 MED ORDER — ONDANSETRON HCL 4 MG PO TABS
8.0000 mg | ORAL_TABLET | Freq: Once | ORAL | Status: AC
  Administered 2017-01-09: 8 mg via ORAL
  Filled 2017-01-09: qty 2

## 2017-01-09 MED ORDER — BORTEZOMIB CHEMO SQ INJECTION 3.5 MG (2.5MG/ML)
1.3000 mg/m2 | Freq: Once | INTRAMUSCULAR | Status: AC
  Administered 2017-01-09: 2.75 mg via SUBCUTANEOUS
  Filled 2017-01-09: qty 2.75

## 2017-01-11 ENCOUNTER — Telehealth: Payer: Self-pay | Admitting: *Deleted

## 2017-01-11 NOTE — Telephone Encounter (Signed)
Called father and discussed that patients potassium was a little low and to increase potassium rich foods in diet, examples given, voiced understandings.

## 2017-01-12 ENCOUNTER — Inpatient Hospital Stay: Payer: BLUE CROSS/BLUE SHIELD

## 2017-01-12 ENCOUNTER — Other Ambulatory Visit: Payer: Self-pay | Admitting: Hematology and Oncology

## 2017-01-12 VITALS — BP 134/87 | HR 69 | Temp 97.3°F | Resp 18

## 2017-01-12 DIAGNOSIS — C880 Waldenstrom macroglobulinemia: Secondary | ICD-10-CM | POA: Diagnosis not present

## 2017-01-12 LAB — CBC WITH DIFFERENTIAL/PLATELET
Basophils Absolute: 0 10*3/uL (ref 0–0.1)
Basophils Relative: 0 %
Eosinophils Absolute: 0.1 10*3/uL (ref 0–0.7)
Eosinophils Relative: 1 %
HCT: 38 % — ABNORMAL LOW (ref 40.0–52.0)
Hemoglobin: 12.9 g/dL — ABNORMAL LOW (ref 13.0–18.0)
Lymphocytes Relative: 18 %
Lymphs Abs: 1.3 10*3/uL (ref 1.0–3.6)
MCH: 31.8 pg (ref 26.0–34.0)
MCHC: 33.9 g/dL (ref 32.0–36.0)
MCV: 94 fL (ref 80.0–100.0)
Monocytes Absolute: 0.8 10*3/uL (ref 0.2–1.0)
Monocytes Relative: 11 %
Neutro Abs: 5.1 10*3/uL (ref 1.4–6.5)
Neutrophils Relative %: 70 %
Platelets: 156 10*3/uL (ref 150–440)
RBC: 4.04 MIL/uL — ABNORMAL LOW (ref 4.40–5.90)
RDW: 13.3 % (ref 11.5–14.5)
WBC: 7.2 10*3/uL (ref 3.8–10.6)

## 2017-01-12 LAB — COMPREHENSIVE METABOLIC PANEL
ALT: 9 U/L — ABNORMAL LOW (ref 17–63)
AST: 13 U/L — ABNORMAL LOW (ref 15–41)
Albumin: 3.4 g/dL — ABNORMAL LOW (ref 3.5–5.0)
Alkaline Phosphatase: 46 U/L (ref 38–126)
Anion gap: 6 (ref 5–15)
BUN: 19 mg/dL (ref 6–20)
CO2: 29 mmol/L (ref 22–32)
Calcium: 8.9 mg/dL (ref 8.9–10.3)
Chloride: 101 mmol/L (ref 101–111)
Creatinine, Ser: 0.87 mg/dL (ref 0.61–1.24)
GFR calc Af Amer: >60 mL/min (ref >60)
GFR calc non Af Amer: >60 mL/min (ref >60)
Glucose, Bld: 170 mg/dL — ABNORMAL HIGH (ref 65–99)
Potassium: 3.7 mmol/L (ref 3.5–5.1)
Sodium: 136 mmol/L (ref 135–145)
Total Bilirubin: 0.6 mg/dL (ref 0.3–1.2)
Total Protein: 6.7 g/dL (ref 6.5–8.1)

## 2017-01-12 MED ORDER — BORTEZOMIB CHEMO SQ INJECTION 3.5 MG (2.5MG/ML)
1.3000 mg/m2 | Freq: Once | INTRAMUSCULAR | Status: AC
  Administered 2017-01-12: 2.75 mg via SUBCUTANEOUS
  Filled 2017-01-12: qty 2.75

## 2017-01-12 MED ORDER — DIPHENHYDRAMINE HCL 50 MG/ML IJ SOLN
50.0000 mg | Freq: Once | INTRAMUSCULAR | Status: AC
  Administered 2017-01-12: 50 mg via INTRAVENOUS
  Filled 2017-01-12: qty 1

## 2017-01-12 MED ORDER — HEPARIN SOD (PORK) LOCK FLUSH 100 UNIT/ML IV SOLN
500.0000 [IU] | Freq: Once | INTRAVENOUS | Status: DC | PRN
  Filled 2017-01-12: qty 5

## 2017-01-12 MED ORDER — ACETAMINOPHEN 325 MG PO TABS
650.0000 mg | ORAL_TABLET | Freq: Once | ORAL | Status: AC
  Administered 2017-01-12: 650 mg via ORAL
  Filled 2017-01-12: qty 2

## 2017-01-12 MED ORDER — SODIUM CHLORIDE 0.9 % IV SOLN: 375.0000 mg/m2 | Freq: Once | INTRAVENOUS | Status: DC

## 2017-01-12 MED ORDER — SODIUM CHLORIDE 0.9 % IV SOLN
Freq: Once | INTRAVENOUS | Status: AC
  Administered 2017-01-12: 10:00:00 via INTRAVENOUS
  Filled 2017-01-12: qty 1000

## 2017-01-12 MED ORDER — SODIUM CHLORIDE 0.9 % IV SOLN
800.0000 mg | Freq: Once | INTRAVENOUS | Status: AC
  Administered 2017-01-12: 800 mg via INTRAVENOUS
  Filled 2017-01-12: qty 50

## 2017-01-12 MED ORDER — ONDANSETRON HCL 4 MG PO TABS
8.0000 mg | ORAL_TABLET | Freq: Once | ORAL | Status: AC
  Administered 2017-01-12: 8 mg via ORAL
  Filled 2017-01-12: qty 2

## 2017-01-31 ENCOUNTER — Inpatient Hospital Stay (HOSPITAL_BASED_OUTPATIENT_CLINIC_OR_DEPARTMENT_OTHER): Payer: BLUE CROSS/BLUE SHIELD | Admitting: Hematology and Oncology

## 2017-01-31 ENCOUNTER — Other Ambulatory Visit: Payer: Self-pay | Admitting: *Deleted

## 2017-01-31 ENCOUNTER — Inpatient Hospital Stay: Payer: BLUE CROSS/BLUE SHIELD

## 2017-01-31 VITALS — BP 125/82 | HR 79 | Temp 98.3°F | Resp 18 | Wt 223.6 lb

## 2017-01-31 DIAGNOSIS — Z803 Family history of malignant neoplasm of breast: Secondary | ICD-10-CM

## 2017-01-31 DIAGNOSIS — C880 Waldenstrom macroglobulinemia: Secondary | ICD-10-CM | POA: Diagnosis not present

## 2017-01-31 DIAGNOSIS — Z79899 Other long term (current) drug therapy: Secondary | ICD-10-CM | POA: Diagnosis not present

## 2017-01-31 DIAGNOSIS — I358 Other nonrheumatic aortic valve disorders: Secondary | ICD-10-CM

## 2017-01-31 DIAGNOSIS — Z8673 Personal history of transient ischemic attack (TIA), and cerebral infarction without residual deficits: Secondary | ICD-10-CM

## 2017-01-31 DIAGNOSIS — Z7984 Long term (current) use of oral hypoglycemic drugs: Secondary | ICD-10-CM

## 2017-01-31 DIAGNOSIS — I10 Essential (primary) hypertension: Secondary | ICD-10-CM

## 2017-01-31 DIAGNOSIS — E538 Deficiency of other specified B group vitamins: Secondary | ICD-10-CM

## 2017-01-31 DIAGNOSIS — N400 Enlarged prostate without lower urinary tract symptoms: Secondary | ICD-10-CM

## 2017-01-31 DIAGNOSIS — D472 Monoclonal gammopathy: Secondary | ICD-10-CM

## 2017-01-31 DIAGNOSIS — M7989 Other specified soft tissue disorders: Secondary | ICD-10-CM

## 2017-01-31 DIAGNOSIS — F0390 Unspecified dementia without behavioral disturbance: Secondary | ICD-10-CM

## 2017-01-31 DIAGNOSIS — Z853 Personal history of malignant neoplasm of breast: Secondary | ICD-10-CM

## 2017-01-31 LAB — CBC WITH DIFFERENTIAL/PLATELET
Basophils Absolute: 0 10*3/uL (ref 0–0.1)
Basophils Relative: 1 %
Eosinophils Absolute: 0.1 10*3/uL (ref 0–0.7)
Eosinophils Relative: 2 %
HCT: 39.4 % — ABNORMAL LOW (ref 40.0–52.0)
Hemoglobin: 13.6 g/dL (ref 13.0–18.0)
Lymphocytes Relative: 19 %
Lymphs Abs: 1.1 10*3/uL (ref 1.0–3.6)
MCH: 32 pg (ref 26.0–34.0)
MCHC: 34.4 g/dL (ref 32.0–36.0)
MCV: 92.9 fL (ref 80.0–100.0)
Monocytes Absolute: 0.6 10*3/uL (ref 0.2–1.0)
Monocytes Relative: 10 %
Neutro Abs: 3.9 10*3/uL (ref 1.4–6.5)
Neutrophils Relative %: 68 %
Platelets: 274 10*3/uL (ref 150–440)
RBC: 4.24 MIL/uL — ABNORMAL LOW (ref 4.40–5.90)
RDW: 13.4 % (ref 11.5–14.5)
WBC: 5.8 10*3/uL (ref 3.8–10.6)

## 2017-01-31 LAB — COMPREHENSIVE METABOLIC PANEL
ALT: 10 U/L — ABNORMAL LOW (ref 17–63)
AST: 18 U/L (ref 15–41)
Albumin: 4.1 g/dL (ref 3.5–5.0)
Alkaline Phosphatase: 50 U/L (ref 38–126)
Anion gap: 6 (ref 5–15)
BUN: 17 mg/dL (ref 6–20)
CO2: 29 mmol/L (ref 22–32)
Calcium: 9.2 mg/dL (ref 8.9–10.3)
Chloride: 101 mmol/L (ref 101–111)
Creatinine, Ser: 0.89 mg/dL (ref 0.61–1.24)
GFR calc Af Amer: >60 mL/min (ref >60)
GFR calc non Af Amer: >60 mL/min (ref >60)
Glucose, Bld: 152 mg/dL — ABNORMAL HIGH (ref 65–99)
Potassium: 3.8 mmol/L (ref 3.5–5.1)
Sodium: 136 mmol/L (ref 135–145)
Total Bilirubin: 0.8 mg/dL (ref 0.3–1.2)
Total Protein: 7 g/dL (ref 6.5–8.1)

## 2017-01-31 NOTE — Progress Notes (Signed)
[facility] day:  01/31/2017  Chief Complaint: Adrian Valdez is a 66 y.o. male with Waldenstrom's macroglobulinemia who is seen for assessment after 4 cycles of Velcade, Decadron, and Rituxan.  HPI:  The patient was last seen in the medical oncology clinic on 01/02/2017.  At that time, memory was slightly improved.  Exam revealed no adenopathy or hepatosplenomegaly.  He began cycle #4 of Velcade.  Cycle #4 completed on 01/12/2017.  During the interim, he feels "ok, I guess".  He notes a "tummy thing" with soreness at old sites of injection.  He states that his feet are cramoy and swollen.  His "eyes are fill of clouds secondary to glaucoma".    Past Medical History:  Diagnosis Date  . 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 B12 deficiency 07/27/2015  . Dementia   . Enlarged prostate   . Glaucoma   . Glaucoma   . Hypertension   . Urinary frequency   . Vitamin B 12 deficiency     Past Surgical History:  Procedure Laterality Date  . KNEE ARTHROSCOPY Right   . LASIK      Family History  Problem Relation Age of Onset  . Hypertension Mother   . Hypertension Father   . CAD Father   . Breast cancer Maternal Grandmother   . Ovarian cancer Paternal Grandmother   . Kidney disease Neg Hx   . Prostate cancer Neg Hx     Social History:  reports that he has never smoked. He has never used smokeless tobacco. He reports that he drinks alcohol. He reports that he does not use drugs.  He notes that he has 6 PhDs in fine arts, music, etc from the [employer].  He lives with his parents.  The patient is accompanied by his sister-in-law today.  Allergies:  Allergies  Allergen Reactions  . Oxybutynin Other (See Comments)    Headache  . Myrbetriq [Mirabegron] Other (See Comments)    Other reaction(s): Other (See Comments) migraine migraine     Current Medications: Current  Outpatient Prescriptions  Medication Sig Dispense Refill  . acyclovir (ZOVIRAX) 400 MG tablet Take 1 tablet (400 mg total) by mouth 2 (two) times daily. 60 tablet 3  . donepezil (ARICEPT) 5 MG tablet Take 5 mg by mouth at bedtime.     . finasteride (PROSCAR) 5 MG tablet Take 5 mg by mouth daily. Reported on 11/30/2015    . glimepiride (AMARYL) 2 MG tablet Take 2 mg by mouth 2 (two) times daily.    Marland Kitchen lisinopril (PRINIVIL,ZESTRIL) 10 MG tablet Take by mouth. Reported on 11/30/2015    . metFORMIN (GLUMETZA) 500 MG (MOD) 24 hr tablet Take 500 mg by mouth. Take 2 tablets (1000 mg) by mouth daily with dinner.    . methazolamide (NEPTAZANE) 50 MG tablet TK 1 T PO BID  1  . tamsulosin (FLOMAX) 0.4 MG CAPS capsule TAKE ONE CAPSULE BY MOUTH ONCE DAILY, 30 MINUTES AFTER THE SAME MEAL EACH DAY    . timolol (BETIMOL) 0.5 % ophthalmic solution Apply to eye. Reported on 11/30/2015    . Travoprost, BAK Free, (TRAVATAN) 0.004 % SOLN ophthalmic solution 1 drop at bedtime.    Marland Kitchen dexamethasone (DECADRON) 4 MG tablet Take 5 tablets in the morning prior to chemotherapy (day 1,4,8, and 11). (Patient not taking: Reported on 01/31/2017) 20 tablet 3  . erythromycin ophthalmic ointment 1 application at bedtime.    Marland Kitchen  nystatin (MYCOSTATIN) 100000 UNIT/ML suspension Take 5 mLs (500,000 Units total) by mouth 4 (four) times daily. (Patient not taking: Reported on 01/02/2017) 200 mL 1  . ondansetron (ZOFRAN) 8 MG tablet Take 1 tablet (8 mg total) by mouth 2 (two) times daily as needed (Nausea or vomiting). (Patient not taking: Reported on 01/02/2017) 30 tablet 1   No current facility-administered medications for this visit.     Review of Systems:  GENERAL:  Feels "ok".  No fevers or sweats.  Weight down 7 pounds. PERFORMANCE STATUS (ECOG):  2 HEENT:  Vision cloudy secondary to glaucoma (stable).  No runny nose, sore throat, mouth sores or tenderness. Lungs: No shortness of breath or cough.  No hemoptysis. Cardiac:  No chest pain,  palpitations, orthopnea, or PND. GI:  Appetite good. Two formed bowel movements/day.  No nausea, diarrhea, constipation, melena or hematochezia. GU:  Enlarged prostate.  Urgency, frequency, burning.  No hematuria. Musculoskeletal:  No back pain.  Right knee issues.  No muscle tenderness. Extremities:  No pain or swelling. Skin:  Right sided facial rash, resolved.  Abdominal bruising and tenderness s/p Velcade injections. Neuro:  Trouble thinking.  No headache, numbness or weakness, balance or coordination issues. Endocrine:  Elevated blood sugar on Metformin.  No thyroid issues, hot flashes or night sweats. Psych:  Poor sleep.  No mood changes, depression or anxiety. Pain:  No focal pain. Review of systems:  All other systems reviewed and found to be negative.  Physical Exam: BP 125/82 (BP Location: Left Arm, Patient Position: Sitting)   Pulse 79   Temp 98.3 F (36.8 C) (Tympanic)   Resp 18   Wt 223 lb 9 oz (101.4 kg)   BMI 31.18 kg/m  GENERAL:  Well developed, well nourished, gentleman sitting comfortably in the exam room. MENTAL STATUS:  Alert and oriented to person and place. Believes it is 1997 today.  HEAD:  Pearline Cables hair.  Male pattern baldness.  Normocephalic, atraumatic, face symmetric, no Cushingoid features. EYES:  Waring dark glasses.  Pupils equal round and reactive to light and accomodation.  No conjunctivitis or scleral icterus. ENT:  No oral lesions.  Tongue normal. Mucous membranes moist.  RESPIRATORY:  Clear to auscultation without rales, wheezes or rhonchi. CARDIOVASCULAR:  Regular rate and rhythm without murmur, rub or gallop. ABDOMEN:  Soft, non-tender, with active bowel sounds, and no hepatosplenomegaly.  No masses. SKIN:  Right sided slightly brown facial rash.  Ecchymosis s/p Velcade injections.  No vesicles.  No evidence of infection.  No ulcers or lesions. EXTREMITIES:  Chronic 1+ right lower extremity edema.  No skin discoloration or tenderness.  No palpable  cords. LYMPH NODES: No palpable cervical, supraclavicular, axillary or inguinal adenopathy  NEUROLOGICAL:  Knows the current president and past 2 president.  Knows current and past talk show hosts. Unknown season. Knows 4 quarters in a dollar. Unsure of how many nickels are in a quarter.  Remembers 3 of 3 objects immediately and 2 of 3 in 5 minutes with prompting. PSYCH:  Flat affect. Conversant.   Appointment on 01/31/2017  Component Date Value Ref Range Status  . WBC 01/31/2017 5.8  3.8 - 10.6 K/uL Final  . RBC 01/31/2017 4.24* 4.40 - 5.90 MIL/uL Final  . Hemoglobin 01/31/2017 13.6  13.0 - 18.0 g/dL Final  . HCT 01/31/2017 39.4* 40.0 - 52.0 % Final  . MCV 01/31/2017 92.9  80.0 - 100.0 fL Final  . MCH 01/31/2017 32.0  26.0 - 34.0 pg Final  . MCHC  01/31/2017 34.4  32.0 - 36.0 g/dL Final  . RDW 01/31/2017 13.4  11.5 - 14.5 % Final  . Platelets 01/31/2017 274  150 - 440 K/uL Final  . Neutrophils Relative % 01/31/2017 68  % Final  . Neutro Abs 01/31/2017 3.9  1.4 - 6.5 K/uL Final  . Lymphocytes Relative 01/31/2017 19  % Final  . Lymphs Abs 01/31/2017 1.1  1.0 - 3.6 K/uL Final  . Monocytes Relative 01/31/2017 10  % Final  . Monocytes Absolute 01/31/2017 0.6  0.2 - 1.0 K/uL Final  . Eosinophils Relative 01/31/2017 2  % Final  . Eosinophils Absolute 01/31/2017 0.1  0 - 0.7 K/uL Final  . Basophils Relative 01/31/2017 1  % Final  . Basophils Absolute 01/31/2017 0.0  0 - 0.1 K/uL Final  . Sodium 01/31/2017 136  135 - 145 mmol/L Final  . Potassium 01/31/2017 3.8  3.5 - 5.1 mmol/L Final  . Chloride 01/31/2017 101  101 - 111 mmol/L Final  . CO2 01/31/2017 29  22 - 32 mmol/L Final  . Glucose, Bld 01/31/2017 152* 65 - 99 mg/dL Final  . BUN 01/31/2017 17  6 - 20 mg/dL Final  . Creatinine, Ser 01/31/2017 0.89  0.61 - 1.24 mg/dL Final  . Calcium 01/31/2017 9.2  8.9 - 10.3 mg/dL Final  . Total Protein 01/31/2017 7.0  6.5 - 8.1 g/dL Final  . Albumin 01/31/2017 4.1  3.5 - 5.0 g/dL Final  . AST  01/31/2017 18  15 - 41 U/L Final  . ALT 01/31/2017 10* 17 - 63 U/L Final  . Alkaline Phosphatase 01/31/2017 50  38 - 126 U/L Final  . Total Bilirubin 01/31/2017 0.8  0.3 - 1.2 mg/dL Final  . GFR calc non Af Amer 01/31/2017 >60  >60 mL/min Final  . GFR calc Af Amer 01/31/2017 >60  >60 mL/min Final   Comment: (NOTE) The eGFR has been calculated using the CKD EPI equation. This calculation has not been validated in all clinical situations. eGFR's persistently <60 mL/min signify possible Chronic Kidney Disease.   . Anion gap 01/31/2017 6  5 - 15 Final    Assessment:  Adrian Valdez is a 66 y.o. 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  Work-up on 08/15/2016 revealed an IgM of 3808.  Serum viscosity was 2.5 (1.6-1.9).  Beta2-microglobulin was 2.5 (0.6-2.4).  Negative studies included:  hepatitis B surface antigen, hepatitis B core antibody, hepatitis C antibody, LDH, and CRP.   Bone marrow biopsy on 08/23/2016 revealed a mature B-cell lymphoma with plasmacytic differentiation, predominantly paratrabecular infiltrate of lymphoplasmacytic infiltrate accounts for 10-15% of marrow space involvement.  Marrow was normocellular for age (50-60%) with trilineage hematopoiesis.  There was mild to moderate paratrabecular increase in reticulin fibers associated with lymphoplasmacytic infiltrate.  [place]ongo red stain was negative for amyloid.  Storage iron was present.    Bone marrow flow cytometry revealed a monotypic B-cell population (0.6% of sample) with a non-specific immunophenotype.  There was abnormal/monotypic plasma cell population (< 0.1% of sample).  Cytogenetics were normal (46, XY).  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  Bone survey on 08/18/2016 revealed no evidence of bony metastatic disease.  There are no CRAB criteria  (hypercalciumia, renal insufficiency, anemia or bone lesions).  Concern is raised for possible contribution of Waldenstrom's to decline in performance status (mentation).  Serum viscosity is mildly elevated.  He  has no palpable adenopathy or hepatosplenomegaly.   SPEP has been monitored:  1.6 gm/dL on 07/09/2015, 2.4 on 07/24/2015, 2.8 on 08/11/2016, 2.0 on 10/31/2016, 1.8 on 11/21/2016, and 1.1 on 01/02/2017.  IgM has been monitored:  3808 on 08/15/2016, 3531 on 10/25/2016, 2757 on 11/21/2016, and 1740 on 01/02/2017.  Kappa free light chains have been monitored:  56.65 (ratio of 5.27) on 07/24/2015 and 70.3 (ratio 6.28) on 08/08/2016.  Chest, abdomen and pelvic CT scan on 08/12/2015 revealed no radiolucent bone lesions. There was no mediastinal or hilar adenopathy.  The ascending aorta measured 4.2 cm in diameter.  Chest, abdomen, and pelvic CT scan on 09/12/2016 revealed no adenopathy or hepatosplenomegaly.  There was mild fusiform enlargement of the ascending aorta (4.2 x 4.1 cm).  There were numerous hepatic cysts, colonic diverticulosis, an enlarged prostate with partial bladder outlet obstruction, and bilateral inguinal hernias.  Head MRI on 09/08/2016 revealed no acute or reversible findings.  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  There was mild to moderate cerebral atrophy.  EEG on 09/27/2016 revealed right temporal epileptiform discharge and left temporal focal slowing.  Paraneoplastic panel was negative.  He has not had an LP.  He has B12 deficiency on supplementation.  B12 was 154 on 07/24/2015 with an elevated MMA.  Folate was 31 and B12 was 246 (low normal) on 08/15/2016.  MMA was 226 (normal) on 08/30/2016.  He receives B12 monthly (last 12/02/2016).  He received 4 cycles of  Velcade, Decadron, and Rituxan (10/31/2016 - 01/02/2017).  He is tolerated treatment well.  Symptomatically,his memory appears unchanged.  Exam reveals no adenopathy or hepatosplenomegaly.    Plan: 1.  Labs today:  CBC with diff, CMP, Mg, SPEP, IgM. 2.  Discuss improvement in M-spike and IgM level without improvement in mentation.  Discuss unclear benefit of additional therapy.  Discuss postponing maintenance therapy. 3.  Cancel chemotherapy scheduled for 04/03/2017. 4.  B12 as scheduled on 06/04. 5.  RTC in 1 month after 06/04 for B12. 6.  RTC in 2 months after 06/04 for MD assessment, labs (CBC with diff, SPEP, IgM), and B12.   Lequita Asal, MD  01/31/2017, 4:27 PM

## 2017-01-31 NOTE — Progress Notes (Signed)
Patient states his digestive system is bad due to contrast he had when he had a CT.   States he is having cramping in his feet. Went to [employer] for [employer] which is supposed to help with cramping.

## 2017-02-01 LAB — IGM: IgM, Serum: 1067 mg/dL — ABNORMAL HIGH (ref 20–172)

## 2017-02-01 LAB — PROTEIN ELECTROPHORESIS, SERUM
A/G Ratio: 1.4 (ref 0.7–1.7)
Albumin ELP: 3.7 g/dL (ref 2.9–4.4)
Alpha-1-Globulin: 0.2 g/dL (ref 0.0–0.4)
Alpha-2-Globulin: 0.7 g/dL (ref 0.4–1.0)
Beta Globulin: 0.8 g/dL (ref 0.7–1.3)
Gamma Globulin: 1 g/dL (ref 0.4–1.8)
Globulin, Total: 2.7 g/dL (ref 2.2–3.9)
M-Spike, %: 0.7 g/dL — ABNORMAL HIGH
Total Protein ELP: 6.4 g/dL (ref 6.0–8.5)

## 2017-02-06 ENCOUNTER — Inpatient Hospital Stay: Payer: BLUE CROSS/BLUE SHIELD | Attending: Hematology and Oncology

## 2017-02-06 DIAGNOSIS — D538 Other specified nutritional anemias: Secondary | ICD-10-CM | POA: Insufficient documentation

## 2017-02-06 DIAGNOSIS — C880 Waldenstrom macroglobulinemia: Secondary | ICD-10-CM | POA: Insufficient documentation

## 2017-02-06 DIAGNOSIS — E538 Deficiency of other specified B group vitamins: Secondary | ICD-10-CM

## 2017-02-06 MED ORDER — CYANOCOBALAMIN 1000 MCG/ML IJ SOLN
1000.0000 ug | Freq: Once | INTRAMUSCULAR | Status: AC
Start: 2017-02-06 — End: 2017-02-06
  Administered 2017-02-06: 1000 ug via INTRAMUSCULAR
  Filled 2017-02-06: qty 1

## 2017-02-06 NOTE — Progress Notes (Signed)
02/07/2017 9:51 AM   Adrian Valdez [birth year] [id number]  Referring provider: Kirk Ruths, MD [facility] [place], [place] [address]  No chief complaint on file.   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 Hospitalized with hallucination with trial of anticholinergic. Prostate vol 146mL by CT 2016 with medain lobe. PVR 2017 " 24 mL". Previously counseled towards simple prostatetomy / HoLEP. Cysto 08/2016 with very large trilobar hypertrophy only, no strictures / masses. Currently continuing dual medical therapy with consideration of HoLEP should bother progress substantially.   2 - Prostate Screening -  2016 - PSA 3.6 (variable finasteride compliance), Vol 165ml, PSAD 0.026 (very low)  3 - Non-Complex Right Renal Cyst - 3cm noncomplex right mid renal cyst on abd CT 2016. No coarse calcifications, enhancing nodules, or mass effect.  PMH sig for major depression, monoclonal antibody spike (follows [facility]-on[facility], on chemo). 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  Today Adrian Valdez is seen in f/u above and PVR check. He is now on chemo for Waldenstrom's.    PMH: Past Medical History:  Diagnosis Date  . 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 B12 deficiency 07/27/2015  . Dementia   . Enlarged prostate   . Glaucoma   . Glaucoma   . Hypertension   . Urinary frequency   . Vitamin B 12 deficiency     Surgical History: Past Surgical History:  Procedure Laterality Date  . KNEE ARTHROSCOPY Right   . LASIK      Home Medications:  Allergies as of 02/07/2017      Reactions   Oxybutynin Other (See Comments)   Headache   Myrbetriq [mirabegron] Other (See Comments)   Other reaction(s): Other (See Comments) migraine migraine      Medication List       Accurate as of 02/06/17   9:51 AM. Always use your most recent med list.          acyclovir 400 MG tablet Commonly known as:  ZOVIRAX Take 1 tablet (400 mg total) by mouth 2 (two) times daily.   dexamethasone 4 MG tablet Commonly known as:  DECADRON Take 5 tablets in the morning prior to chemotherapy (day 1,4,8, and 11).   donepezil 5 MG tablet Commonly known as:  ARICEPT Take 5 mg by mouth at bedtime.   erythromycin ophthalmic ointment 1 application at bedtime.   finasteride 5 MG tablet Commonly known as:  PROSCAR Take 5 mg by mouth daily. Reported on 11/30/2015   glimepiride 2 MG tablet Commonly known as:  AMARYL Take 2 mg by mouth 2 (two) times daily.   lisinopril 10 MG tablet Commonly known as:  PRINIVIL,ZESTRIL Take by mouth. Reported on 11/30/2015   metFORMIN 500 MG (MOD) 24 hr tablet Commonly known as:  GLUMETZA Take 500 mg by mouth. Take 2 tablets (1000 mg) by mouth daily with dinner.   methazolamide 50 MG tablet Commonly known as:  NEPTAZANE TK 1 T PO BID   nystatin 100000 UNIT/ML suspension Commonly known as:  MYCOSTATIN Take 5 mLs (500,000 Units total) by mouth 4 (four) times daily.   ondansetron 8 MG tablet Commonly known as:  ZOFRAN Take 1 tablet (8 mg total) by mouth 2 (two) times daily as needed (Nausea or vomiting).   tamsulosin 0.4 MG Caps capsule  Commonly known as:  FLOMAX TAKE ONE CAPSULE BY MOUTH ONCE DAILY, 30 MINUTES AFTER THE SAME MEAL EACH DAY   timolol 0.5 % ophthalmic solution Commonly known as:  BETIMOL Apply to eye. Reported on 11/30/2015   Travoprost (BAK Free) 0.004 % Soln ophthalmic solution Commonly known as:  TRAVATAN 1 drop at bedtime.       Allergies:  Allergies  Allergen Reactions  . Oxybutynin Other (See Comments)    Headache  . Myrbetriq [Mirabegron] Other (See Comments)    Other reaction(s): Other (See Comments) migraine migraine     Family History: Family History  Problem Relation Age of Onset  . Hypertension Mother   .  Hypertension Father   . CAD Father   . Breast cancer Maternal Grandmother   . Ovarian cancer Paternal Grandmother   . Kidney disease Neg Hx   . Prostate cancer Neg Hx     Social History:  reports that he has never smoked. He has never used smokeless tobacco. He reports that he drinks alcohol. He reports that he does not use drugs.  ROS:     Review of Systems  Gastrointestinal (upper)  : Negative for upper GI symptoms  Gastrointestinal (lower) : Negative for lower GI symptoms  Constitutional : Negative for symptoms  Skin: Negative for skin symptoms  Eyes: Negative for eye symptoms  Ear/Nose/Throat : Negative for Ear/Nose/Throat symptoms  Hematologic/Lymphatic: Negative for Hematologic/Lymphatic symptoms  Cardiovascular : Negative for cardiovascular symptoms  Respiratory : Negative for respiratory symptoms  Endocrine: Negative for endocrine symptoms  Musculoskeletal: Negative for musculoskeletal symptoms  Neurological: Negative for neurological symptoms  Psychologic: Negative for psychiatric symptoms       Physical Exam: There were no vitals taken for this visit.  Constitutional:  Alert and oriented, No acute distress. Very blunted affect. HEENT: [place] AT, moist mucus membranes.  Trachea midline, no masses. Cardiovascular: No clubbing, cyanosis, or edema. Respiratory: Normal respiratory effort, no increased work of breathing. GI: Abdomen is soft, nontender, nondistended, no abdominal masses GU: No CVA tenderness. DRE >80gm smooth.  Skin: No rashes, bruises or suspicious lesions. Lymph: No cervical or inguinal adenopathy. Neurologic: Grossly intact, no focal deficits, moving all 4 extremities. Psychiatric: Normal mood and affect.  Laboratory Data: Lab Results  Component Value Date   WBC 5.8 01/31/2017   HGB 13.6 01/31/2017   HCT 39.4 (L) 01/31/2017   MCV 92.9 01/31/2017   PLT 274 01/31/2017    Lab Results  Component Value Date   CREATININE  0.89 01/31/2017    No results found for: PSA  No results found for: TESTOSTERONE  No results found for: HGBA1C  Urinalysis    Component Value Date/Time   COLORURINE YELLOW (A) 01/02/2017 1530   APPEARANCEUR CLEAR (A) 01/02/2017 1530   APPEARANCEUR Hazy (A) 08/16/2016 1029   LABSPEC 1.016 01/02/2017 1530   PHURINE 6.0 01/02/2017 1530   GLUCOSEU >=500 (A) 01/02/2017 1530   HGBUR NEGATIVE 01/02/2017 1530   BILIRUBINUR NEGATIVE 01/02/2017 1530   BILIRUBINUR Negative 08/16/2016 [place] 01/02/2017 1530   PROTEINUR NEGATIVE 01/02/2017 1530   NITRITE NEGATIVE 01/02/2017 1530   LEUKOCYTESUR NEGATIVE 01/02/2017 1530   LEUKOCYTESUR Negative 08/16/2016 1029    Pertinent Imaging: bladder scan reviewed as per HPI  1 - 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 Rec simple prostatectomy v. HoLEP as most definitive means of management as this will otherwise likely  continue to progress. Fortunately he empties well at present. At this point he wants to continue dual medical therapy and consider HoLEP should bother progress, this is certainly reasonable.   2 - Prostate Screening - up to date..   3 - Non-Complex Right Renal Cyst - no further evaluation warranted, low risk.   RTC 1 year with PVR and PSA.    Alexis Frock, [facility] [address] [place], [place] [address] [phone]

## 2017-02-07 ENCOUNTER — Ambulatory Visit (INDEPENDENT_AMBULATORY_CARE_PROVIDER_SITE_OTHER): Payer: BLUE CROSS/BLUE SHIELD | Admitting: Urology

## 2017-02-07 ENCOUNTER — Encounter: Payer: Self-pay | Admitting: Urology

## 2017-02-07 VITALS — BP 157/94 | HR 72 | Ht 69.0 in | Wt 220.0 lb

## 2017-02-07 DIAGNOSIS — R3915 Urgency of urination: Secondary | ICD-10-CM

## 2017-02-07 DIAGNOSIS — N281 Cyst of kidney, acquired: Secondary | ICD-10-CM | POA: Diagnosis not present

## 2017-02-07 LAB — BLADDER SCAN AMB NON-IMAGING

## 2017-02-07 MED ORDER — FINASTERIDE 5 MG PO TABS: 5.0000 mg | ORAL_TABLET | Freq: Every day | ORAL | 3 refills | Status: DC

## 2017-02-07 MED ORDER — TAMSULOSIN HCL 0.4 MG PO CAPS: 0.4000 mg | ORAL_CAPSULE | Freq: Every day | ORAL | 3 refills | Status: DC

## 2017-02-14 ENCOUNTER — Telehealth: Payer: Self-pay | Admitting: *Deleted

## 2017-02-14 NOTE — Telephone Encounter (Signed)
Sister in law was with her brother today and wanted to know if she could leave a message with [facility] team about doing a Brain pet scan instead of Dr. Manuella Ghazi.  She states that they were told that it could not be done at [facility]  I told family that if it is a special scan then it may not be one that we do here.  I would check.  I called and spoke to [facility] the staff for Dr. Manuella Ghazi and she states it is special pet and it can only be done at [facility] it is a amyzid pet scan.  I checked with PET at [facility] and they do not do that scan here. I told Juliann Pulse about the special scan and if they want to proceed with having it at [facility] then they would need to call neurology back. She understands

## 2017-02-25 ENCOUNTER — Encounter: Payer: Self-pay | Admitting: Hematology and Oncology

## 2017-03-06 ENCOUNTER — Inpatient Hospital Stay: Payer: BLUE CROSS/BLUE SHIELD | Attending: Hematology and Oncology

## 2017-03-06 DIAGNOSIS — E538 Deficiency of other specified B group vitamins: Secondary | ICD-10-CM | POA: Diagnosis not present

## 2017-03-06 DIAGNOSIS — C880 Waldenstrom macroglobulinemia: Secondary | ICD-10-CM | POA: Diagnosis present

## 2017-03-06 MED ORDER — CYANOCOBALAMIN 1000 MCG/ML IJ SOLN
1000.0000 ug | Freq: Once | INTRAMUSCULAR | Status: AC
  Administered 2017-03-06: 1000 ug via INTRAMUSCULAR

## 2017-03-13 ENCOUNTER — Ambulatory Visit: Payer: BLUE CROSS/BLUE SHIELD

## 2017-03-13 ENCOUNTER — Other Ambulatory Visit: Payer: BLUE CROSS/BLUE SHIELD

## 2017-03-13 ENCOUNTER — Ambulatory Visit: Payer: BLUE CROSS/BLUE SHIELD | Admitting: Hematology and Oncology

## 2017-03-27 ENCOUNTER — Telehealth: Payer: Self-pay | Admitting: Urology

## 2017-03-27 NOTE — Telephone Encounter (Signed)
Pt's Dad called and he is on 0.4 mg Tamsulosin.  Pt is urinating more frequently and they want to know if they can increase the dosage.  Please give him a call.

## 2017-03-27 NOTE — Telephone Encounter (Signed)
Dad states pt has been urinating more frequently in the past few days. Advised pt's dad he could bring pt in for nurse visit to provide urine sample for urinalysis,  b/c dad does not know what other sx(s) patient is having. Dad agreed.

## 2017-03-28 ENCOUNTER — Ambulatory Visit (INDEPENDENT_AMBULATORY_CARE_PROVIDER_SITE_OTHER): Payer: BLUE CROSS/BLUE SHIELD

## 2017-03-28 VITALS — BP 118/82 | HR 92 | Ht 69.0 in | Wt 208.0 lb

## 2017-03-28 DIAGNOSIS — R300 Dysuria: Secondary | ICD-10-CM | POA: Diagnosis not present

## 2017-03-28 LAB — MICROSCOPIC EXAMINATION
Bacteria, UA: NONE SEEN
EPITHELIAL CELLS (NON RENAL): NONE SEEN /HPF (ref 0–10)
RBC, UA: NONE SEEN /hpf (ref 0–?)
WBC UA: NONE SEEN /HPF (ref 0–?)

## 2017-03-28 LAB — URINALYSIS, COMPLETE
BILIRUBIN UA: NEGATIVE
GLUCOSE, UA: NEGATIVE
KETONES UA: NEGATIVE
Leukocytes, UA: NEGATIVE
NITRITE UA: NEGATIVE
RBC UA: NEGATIVE
Specific Gravity, UA: >1.03 — ABNORMAL HIGH (ref 1.005–1.030)
UUROB: 0.2 mg/dL (ref 0.2–1.0)
pH, UA: 5 (ref 5.0–7.5)

## 2017-03-28 NOTE — Addendum Note (Signed)
Addended by: Leia Alf on: 03/28/2017 11:23 AM   Modules accepted: Orders

## 2017-03-28 NOTE — Progress Notes (Signed)
Pt present in office today with c/o UTI sx(s):   Nausea Vomiting Fever Night Sweats Urinary Urgency  Has not been on antibiotics in the last 30 days. Not taking suppressive antibiotics. Has not had urological surgery in the last 30 days.      Vitals:   03/28/17 1023  BP: 118/82  Pulse: 92  Weight: 208 lb (94.3 kg)  Height: 5\' 9"  (1.753 m)   Pt provided urine sample, advised pt on u/a and ucx turn-around times, and informed will contact when results reviewed by provider. Pt verbalized understanding.

## 2017-03-29 ENCOUNTER — Telehealth: Payer: Self-pay

## 2017-03-29 NOTE — Telephone Encounter (Signed)
Spoke to patient. Gave results and suggestion per Shannon's PA-C previous note. Patient verbalized understanding. Patient reports his sx(s) are improving. Will c/b to schedule bladder scan later if he feels the need.

## 2017-03-29 NOTE — Telephone Encounter (Signed)
-----   Message from Nori Riis, PA-C sent at 03/28/2017  8:50 PM EDT ----- Adrian Valdez's urine does not look infected at this time.  We may not get the urine culture results until Monday.  I suggest that he have a bladder scan to check his PVR to make sure he is emptying.

## 2017-03-30 ENCOUNTER — Ambulatory Visit (INDEPENDENT_AMBULATORY_CARE_PROVIDER_SITE_OTHER): Payer: BLUE CROSS/BLUE SHIELD

## 2017-03-30 ENCOUNTER — Telehealth: Payer: Self-pay

## 2017-03-30 DIAGNOSIS — R300 Dysuria: Secondary | ICD-10-CM

## 2017-03-30 LAB — URINE CULTURE: ORGANISM ID, BACTERIA: NO GROWTH

## 2017-03-30 LAB — BLADDER SCAN AMB NON-IMAGING

## 2017-03-30 NOTE — Progress Notes (Signed)
Patient returned to office for PVR:   Bladder Scan Patient can void: 1 ml Performed By: C. Corinna Capra, [place]  Patient verbalized understanding to follow up with PCP since PVR low.

## 2017-03-30 NOTE — Telephone Encounter (Signed)
Called patient. Father id'd himself as patient at first, then stated he was not the patient but would give the patient the info, and if he felt like coming in for PVR he would have to call and schedule first.

## 2017-03-30 NOTE — Telephone Encounter (Signed)
-----   Message from Nori Riis, PA-C sent at 03/30/2017  8:00 AM EDT ----- Please let Mr. Aburto know that his urine culture is negative.  He should come in for a PVR and if that is low, he needs to see his PCP.

## 2017-04-03 ENCOUNTER — Other Ambulatory Visit: Payer: BLUE CROSS/BLUE SHIELD

## 2017-04-03 ENCOUNTER — Ambulatory Visit: Payer: BLUE CROSS/BLUE SHIELD | Admitting: Hematology and Oncology

## 2017-04-03 ENCOUNTER — Ambulatory Visit: Payer: BLUE CROSS/BLUE SHIELD

## 2017-04-06 ENCOUNTER — Other Ambulatory Visit: Payer: BLUE CROSS/BLUE SHIELD

## 2017-04-06 ENCOUNTER — Ambulatory Visit: Payer: BLUE CROSS/BLUE SHIELD | Admitting: Hematology and Oncology

## 2017-04-06 ENCOUNTER — Ambulatory Visit: Payer: BLUE CROSS/BLUE SHIELD

## 2017-04-10 ENCOUNTER — Inpatient Hospital Stay: Payer: BLUE CROSS/BLUE SHIELD | Attending: Hematology and Oncology

## 2017-04-10 ENCOUNTER — Other Ambulatory Visit: Payer: Self-pay

## 2017-04-10 ENCOUNTER — Encounter: Payer: Self-pay | Admitting: Hematology and Oncology

## 2017-04-10 ENCOUNTER — Inpatient Hospital Stay: Payer: BLUE CROSS/BLUE SHIELD

## 2017-04-10 ENCOUNTER — Inpatient Hospital Stay (HOSPITAL_BASED_OUTPATIENT_CLINIC_OR_DEPARTMENT_OTHER): Payer: BLUE CROSS/BLUE SHIELD | Admitting: Hematology and Oncology

## 2017-04-10 ENCOUNTER — Other Ambulatory Visit: Payer: Self-pay | Admitting: Hematology and Oncology

## 2017-04-10 VITALS — BP 108/73 | HR 90 | Temp 96.8°F | Resp 18 | Wt 208.1 lb

## 2017-04-10 DIAGNOSIS — C8800 Waldenstrom macroglobulinemia not having achieved remission: Secondary | ICD-10-CM

## 2017-04-10 DIAGNOSIS — D472 Monoclonal gammopathy: Secondary | ICD-10-CM

## 2017-04-10 DIAGNOSIS — R197 Diarrhea, unspecified: Secondary | ICD-10-CM

## 2017-04-10 DIAGNOSIS — F0390 Unspecified dementia without behavioral disturbance: Secondary | ICD-10-CM | POA: Insufficient documentation

## 2017-04-10 DIAGNOSIS — Z803 Family history of malignant neoplasm of breast: Secondary | ICD-10-CM | POA: Diagnosis not present

## 2017-04-10 DIAGNOSIS — Z79899 Other long term (current) drug therapy: Secondary | ICD-10-CM | POA: Diagnosis not present

## 2017-04-10 DIAGNOSIS — R21 Rash and other nonspecific skin eruption: Secondary | ICD-10-CM

## 2017-04-10 DIAGNOSIS — E538 Deficiency of other specified B group vitamins: Secondary | ICD-10-CM

## 2017-04-10 DIAGNOSIS — N400 Enlarged prostate without lower urinary tract symptoms: Secondary | ICD-10-CM | POA: Insufficient documentation

## 2017-04-10 DIAGNOSIS — Z8041 Family history of malignant neoplasm of ovary: Secondary | ICD-10-CM | POA: Insufficient documentation

## 2017-04-10 DIAGNOSIS — Z7984 Long term (current) use of oral hypoglycemic drugs: Secondary | ICD-10-CM | POA: Diagnosis not present

## 2017-04-10 DIAGNOSIS — H409 Unspecified glaucoma: Secondary | ICD-10-CM | POA: Diagnosis not present

## 2017-04-10 DIAGNOSIS — G629 Polyneuropathy, unspecified: Secondary | ICD-10-CM | POA: Diagnosis not present

## 2017-04-10 DIAGNOSIS — I10 Essential (primary) hypertension: Secondary | ICD-10-CM

## 2017-04-10 DIAGNOSIS — R600 Localized edema: Secondary | ICD-10-CM

## 2017-04-10 DIAGNOSIS — Z5111 Encounter for antineoplastic chemotherapy: Secondary | ICD-10-CM

## 2017-04-10 LAB — COMPREHENSIVE METABOLIC PANEL
ALT: 8 U/L — ABNORMAL LOW (ref 17–63)
AST: 16 U/L (ref 15–41)
Albumin: 4.2 g/dL (ref 3.5–5.0)
Alkaline Phosphatase: 44 U/L (ref 38–126)
Anion gap: 7 (ref 5–15)
BUN: 20 mg/dL (ref 6–20)
CO2: 30 mmol/L (ref 22–32)
Calcium: 9.1 mg/dL (ref 8.9–10.3)
Chloride: 102 mmol/L (ref 101–111)
Creatinine, Ser: 1.14 mg/dL (ref 0.61–1.24)
GFR calc Af Amer: >60 mL/min (ref >60)
GFR calc non Af Amer: >60 mL/min (ref >60)
Glucose, Bld: 173 mg/dL — ABNORMAL HIGH (ref 65–99)
Potassium: 3.5 mmol/L (ref 3.5–5.1)
Sodium: 139 mmol/L (ref 135–145)
Total Bilirubin: 0.7 mg/dL (ref 0.3–1.2)
Total Protein: 6.8 g/dL (ref 6.5–8.1)

## 2017-04-10 LAB — CBC WITH DIFFERENTIAL/PLATELET
Basophils Absolute: 0.1 10*3/uL (ref 0–0.1)
Basophils Relative: 1 %
Eosinophils Absolute: 0.1 10*3/uL (ref 0–0.7)
Eosinophils Relative: 2 %
HCT: 40.8 % (ref 40.0–52.0)
Hemoglobin: 13.9 g/dL (ref 13.0–18.0)
Lymphocytes Relative: 16 %
Lymphs Abs: 1.1 10*3/uL (ref 1.0–3.6)
MCH: 31.5 pg (ref 26.0–34.0)
MCHC: 34 g/dL (ref 32.0–36.0)
MCV: 92.7 fL (ref 80.0–100.0)
Monocytes Absolute: 0.5 10*3/uL (ref 0.2–1.0)
Monocytes Relative: 7 %
Neutro Abs: 5 10*3/uL (ref 1.4–6.5)
Neutrophils Relative %: 74 %
Platelets: 255 10*3/uL (ref 150–440)
RBC: 4.41 MIL/uL (ref 4.40–5.90)
RDW: 13 % (ref 11.5–14.5)
WBC: 6.8 10*3/uL (ref 3.8–10.6)

## 2017-04-10 MED ORDER — CYANOCOBALAMIN 1000 MCG/ML IJ SOLN
1000.0000 ug | Freq: Once | INTRAMUSCULAR | Status: AC
  Administered 2017-04-10: 1000 ug via INTRAMUSCULAR
  Filled 2017-04-10: qty 1

## 2017-04-10 NOTE — Progress Notes (Signed)
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  Patient c/o right leg pain.  Patient does not report any injury.

## 2017-04-10 NOTE — Progress Notes (Signed)
[facility] day:  04/10/2017  Chief Complaint: Adrian Valdez is a 66 y.o. male with Waldenstrom's macroglobulinemia who is seen for 2 month assessment and continuation of monthly B12.  HPI:  The patient was last seen in the medical oncology clinic on 01/31/2017.  At that time, his memory appeared unchanged.  Exam revealed no adenopathy or hepatosplenomegaly.  CBC revealed a hematocrit 39.4, hemoglobin 13.6, platelets 274,000 and white count 5800.  SPEP revealed a 0.7 g/dL monoclonal spike.  IgM was 1067.  He has continued monthly B12 (02/06/2017 and 03/06/2017).  Symptomatically, he notes neuropathy in his feet. He notes that it is painful to walk (needlelike sensation). He is trying gabapentin. He was seen by psychiatry for possible depression. He wants surgery on his right knee.  He denies any fevers, sweats or weight loss.   Past Medical History:  Diagnosis Date  . 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 B12 deficiency 07/27/2015  . Dementia   . Enlarged prostate   . Glaucoma   . Glaucoma   . Hypertension   . Urinary frequency   . Vitamin B 12 deficiency     Past Surgical History:  Procedure Laterality Date  . KNEE ARTHROSCOPY Right   . LASIK      Family History  Problem Relation Age of Onset  . Hypertension Mother   . Hypertension Father   . CAD Father   . Breast cancer Maternal Grandmother   . Ovarian cancer Paternal Grandmother   . Kidney disease Neg Hx   . Prostate cancer Neg Hx     Social History:  reports that he has never smoked. He has never used smokeless tobacco. He reports that he drinks alcohol. He reports that he does not use drugs.  He notes that he has 6 PhDs in fine arts, music, etc from the [employer], [place], and [employer].  He lives with his parents.  The patient is accompanied by his sister-in-law today.  Allergies:  Allergies  Allergen Reactions  . Oxybutynin Other  (See Comments)    Headache  . Myrbetriq [Mirabegron] Other (See Comments)    Other reaction(s): Other (See Comments) migraine migraine     Current Medications: Current Outpatient Prescriptions  Medication Sig Dispense Refill  . acyclovir (ZOVIRAX) 400 MG tablet Take 1 tablet (400 mg total) by mouth 2 (two) times daily. 60 tablet 3  . donepezil (ARICEPT) 5 MG tablet Take 5 mg by mouth at bedtime.     Marland Kitchen erythromycin ophthalmic ointment 1 application at bedtime.    . finasteride (PROSCAR) 5 MG tablet Take 1 tablet (5 mg total) by mouth daily. Reported on 11/30/2015 90 tablet 3  . glimepiride (AMARYL) 2 MG tablet Take 2 mg by mouth 2 (two) times daily.    Marland Kitchen lisinopril (PRINIVIL,ZESTRIL) 10 MG tablet Take by mouth. Reported on 11/30/2015    . metFORMIN (GLUMETZA) 500 MG (MOD) 24 hr tablet Take 500 mg by mouth. Take 2 tablets (1000 mg) by mouth daily with dinner.    . methazolamide (NEPTAZANE) 50 MG tablet TK 1 T PO BID  1  . nystatin (MYCOSTATIN) 100000 UNIT/ML suspension Take 5 mLs (500,000 Units total) by mouth 4 (four) times daily. 200 mL 1  . tamsulosin (FLOMAX) 0.4 MG CAPS capsule TAKE ONE CAPSULE BY MOUTH ONCE DAILY, 30 MINUTES AFTER THE SAME MEAL EACH DAY    . tamsulosin (FLOMAX) 0.4 MG CAPS capsule Take 1  capsule (0.4 mg total) by mouth daily. 90 capsule 3  . timolol (BETIMOL) 0.5 % ophthalmic solution Apply to eye. Reported on 11/30/2015    . Travoprost, BAK Free, (TRAVATAN) 0.004 % SOLN ophthalmic solution 1 drop at bedtime.     No current facility-administered medications for this visit.     Review of Systems:  GENERAL:  Feels "the same".  No fevers or sweats.  Weight down 15 pounds. PERFORMANCE STATUS (ECOG):  2 HEENT: Glaucoma (stable).  No runny nose, sore throat, mouth sores or tenderness. Lungs: No shortness of breath or cough.  No hemoptysis. Cardiac:  No chest pain, palpitations, orthopnea, or PND. GI:  No nausea, vomiting, diarrhea, constipation, melena or  hematochezia. GU:  Enlarged prostate.  Urgency, frequency, burning.  No hematuria.  He states that he saw the urologist. Musculoskeletal:  No back pain.  Right knee issues.  No muscle tenderness. Extremities:  No pain or swelling. Skin:  No rashes or ulcers.  No bruises. Neuro:  Trouble thinking (no change).  Neuropathy in feet (see HPI).  No headache, numbness or weakness, balance or coordination issues. Endocrine:  Diabetes on Metformin.  No thyroid issues, hot flashes or night sweats. Psych:  Poor sleep.  ? Depression- seeing psychiatry.  No mood changes or anxiety. Pain:  No focal pain. Review of systems:  All other systems reviewed and found to be negative.  Physical Exam: BP 108/73 (BP Location: Left Arm, Patient Position: Sitting)   Pulse 90   Temp (!) 96.8 F (36 C) (Tympanic)   Resp 18   Wt 208 lb 1 oz (94.4 kg)   BMI 30.73 kg/m  GENERAL:  Well developed, well nourished, gentleman sitting comfortably in the exam room. MENTAL STATUS:  Alert and oriented to person and place. Believes it is 1997 today.  HEAD:  Pearline Cables hair.  Male pattern baldness.  Normocephalic, atraumatic, face symmetric, no Cushingoid features. EYES:  Waring dark glasses.  Pupils equal round and reactive to light and accomodation.  No conjunctivitis or scleral icterus. ENT:  No oral lesions.  Tongue normal. Mucous membranes moist.  RESPIRATORY:  Clear to auscultation without rales, wheezes or rhonchi. CARDIOVASCULAR:  Regular rate and rhythm without murmur, rub or gallop. ABDOMEN:  Soft, non-tender, with active bowel sounds, and no hepatosplenomegaly.  No masses. SKIN:  No bruising or petechiae.  No ulcers or lesions. EXTREMITIES:  Chronic 1+ right lower extremity edema.  No skin discoloration or tenderness.  No palpable cords. LYMPH NODES: No palpable cervical, supraclavicular, axillary or inguinal adenopathy  NEUROLOGICAL:  Stable. PSYCH:  Flat affect. Conversant.   Orders Only on 04/10/2017  Component  Date Value Ref Range Status  . WBC 04/10/2017 6.8  3.8 - 10.6 K/uL Final  . RBC 04/10/2017 4.41  4.40 - 5.90 MIL/uL Final  . Hemoglobin 04/10/2017 13.9  13.0 - 18.0 g/dL Final  . HCT 04/10/2017 40.8  40.0 - 52.0 % Final  . MCV 04/10/2017 92.7  80.0 - 100.0 fL Final  . MCH 04/10/2017 31.5  26.0 - 34.0 pg Final  . MCHC 04/10/2017 34.0  32.0 - 36.0 g/dL Final  . RDW 04/10/2017 13.0  11.5 - 14.5 % Final  . Platelets 04/10/2017 255  150 - 440 K/uL Final  . Neutrophils Relative % 04/10/2017 74  % Final  . Neutro Abs 04/10/2017 5.0  1.4 - 6.5 K/uL Final  . Lymphocytes Relative 04/10/2017 16  % Final  . Lymphs Abs 04/10/2017 1.1  1.0 - 3.6 K/uL Final  .  Monocytes Relative 04/10/2017 7  % Final  . Monocytes Absolute 04/10/2017 0.5  0.2 - 1.0 K/uL Final  . Eosinophils Relative 04/10/2017 2  % Final  . Eosinophils Absolute 04/10/2017 0.1  0 - 0.7 K/uL Final  . Basophils Relative 04/10/2017 1  % Final  . Basophils Absolute 04/10/2017 0.1  0 - 0.1 K/uL Final  . Sodium 04/10/2017 139  135 - 145 mmol/L Final  . Potassium 04/10/2017 3.5  3.5 - 5.1 mmol/L Final  . Chloride 04/10/2017 102  101 - 111 mmol/L Final  . CO2 04/10/2017 30  22 - 32 mmol/L Final  . Glucose, Bld 04/10/2017 173* 65 - 99 mg/dL Final  . BUN 04/10/2017 20  6 - 20 mg/dL Final  . Creatinine, Ser 04/10/2017 1.14  0.61 - 1.24 mg/dL Final  . Calcium 04/10/2017 9.1  8.9 - 10.3 mg/dL Final  . Total Protein 04/10/2017 6.8  6.5 - 8.1 g/dL Final  . Albumin 04/10/2017 4.2  3.5 - 5.0 g/dL Final  . AST 04/10/2017 16  15 - 41 U/L Final  . ALT 04/10/2017 8* 17 - 63 U/L Final  . Alkaline Phosphatase 04/10/2017 44  38 - 126 U/L Final  . Total Bilirubin 04/10/2017 0.7  0.3 - 1.2 mg/dL Final  . GFR calc non Af Amer 04/10/2017 >60  >60 mL/min Final  . GFR calc Af Amer 04/10/2017 >60  >60 mL/min Final   Comment: (NOTE) The eGFR has been calculated using the CKD EPI equation. This calculation has not been validated in all clinical  situations. eGFR's persistently <60 mL/min signify possible Chronic Kidney Disease.   . Anion gap 04/10/2017 7  5 - 15 Final    Assessment:  Sebastion Fread Kottke is a 66 y.o. 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  Work-up on 08/15/2016 revealed an IgM of 3808.  Serum viscosity was 2.5 (1.6-1.9).  Beta2-microglobulin was 2.5 (0.6-2.4).  Negative studies included:  hepatitis B surface antigen, hepatitis B core antibody, hepatitis C antibody, LDH, and CRP.   Bone marrow biopsy on 08/23/2016 revealed a mature B-cell lymphoma with plasmacytic differentiation, predominantly paratrabecular infiltrate of lymphoplasmacytic infiltrate accounts for 10-15% of marrow space involvement.  Marrow was normocellular for age (50-60%) with trilineage hematopoiesis.  There was mild to moderate paratrabecular increase in reticulin fibers associated with lymphoplasmacytic infiltrate.  [place]ongo red stain was negative for amyloid.  Storage iron was present.    Bone marrow flow cytometry revealed a monotypic B-cell population (0.6% of sample) with a non-specific immunophenotype.  There was abnormal/monotypic plasma cell population (< 0.1% of sample).  Cytogenetics were normal (46, XY).  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  Bone survey on 08/18/2016 revealed no evidence of bony metastatic disease.  There are no CRAB criteria (hypercalciumia, renal insufficiency, anemia or bone lesions).  Concern is raised for possible contribution of Waldenstrom's to decline in performance status (mentation).  Serum viscosity is mildly elevated.  He has no palpable adenopathy or hepatosplenomegaly.   SPEP has been monitored:  1.6 gm/dL on 07/09/2015, 2.4 on 07/24/2015, 2.8 on 08/11/2016, 2.0 on 10/31/2016, 1.8 on 11/21/2016, 1.1 on 01/02/2017, 0.7 on 01/31/2017, and  0.5 on 04/10/2017.  IgM has been followed:  3808 on 08/15/2016, 3531 on 10/25/2016, 2757 on 11/21/2016, 1740 on 01/02/2017, 1067 on 01/31/2017, and 911 on 04/10/2017.  Kappa free light chains have been monitored:  56.65 (ratio  of 5.27) on 07/24/2015 and 70.3 (ratio 6.28) on 08/08/2016.  Chest, abdomen and pelvic CT scan on 08/12/2015 revealed no radiolucent bone lesions. There was no mediastinal or hilar adenopathy.  The ascending aorta measured 4.2 cm in diameter.  Chest, abdomen, and pelvic CT scan on 09/12/2016 revealed no adenopathy or hepatosplenomegaly.  There was mild fusiform enlargement of the ascending aorta (4.2 x 4.1 cm).  There were numerous hepatic cysts, colonic diverticulosis, an enlarged prostate with partial bladder outlet obstruction, and bilateral inguinal hernias.  Head MRI on 09/08/2016 revealed no acute or reversible findings.  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  There was mild to moderate cerebral atrophy.  EEG on 09/27/2016 revealed right temporal epileptiform discharge and left temporal focal slowing.  Paraneoplastic panel was negative.  He has not had an LP.  He has B12 deficiency on supplementation.  B12 was 154 on 07/24/2015 with an elevated MMA.  Folate was 31 and B12 was 246 (low normal) on 08/15/2016.  MMA was 226 (normal) on 08/30/2016.  He receives B12 monthly (last 03/06/2017).  He received 4 cycles of  Velcade, Decadron, and Rituxan (10/31/2016 - 01/02/2017).  He is tolerated treatment well.  Symptomatically, his memory remains poor.  He has a neuropathy in his feet.  He is trying Neurontin.  Exam reveals no adenopathy or hepatosplenomegaly.  M spike is 0.5 gm/dL.  Plan: 1.  Labs today:  CBC with diff, CMP, SPEP, IgM. 2.  Discuss improvement in M-spike and IgM level.  Discuss continued observation. 3.  Follow-up as scheduled with neurology. 4.  B12 today and monthly x 2 5.  RTC in 3 months for MD assessment, labs (CBC with diff,  SPEP, IgM), and B12.   Lequita Asal, MD  04/10/2017, 2:39 PM

## 2017-04-11 ENCOUNTER — Telehealth: Payer: Self-pay | Admitting: *Deleted

## 2017-04-11 LAB — PROTEIN ELECTROPHORESIS, SERUM
A/G Ratio: 1.4 (ref 0.7–1.7)
Albumin ELP: 3.7 g/dL (ref 2.9–4.4)
Alpha-1-Globulin: 0.2 g/dL (ref 0.0–0.4)
Alpha-2-Globulin: 0.6 g/dL (ref 0.4–1.0)
Beta Globulin: 0.8 g/dL (ref 0.7–1.3)
Gamma Globulin: 0.9 g/dL (ref 0.4–1.8)
Globulin, Total: 2.6 g/dL (ref 2.2–3.9)
M-Spike, %: 0.5 g/dL — ABNORMAL HIGH
Total Protein ELP: 6.3 g/dL (ref 6.0–8.5)

## 2017-04-11 LAB — IGM: IgM, Serum: 911 mg/dL — ABNORMAL HIGH (ref 20–172)

## 2017-04-11 NOTE — Telephone Encounter (Signed)
Called patient and LVM that IgM continues to decrease.

## 2017-04-11 NOTE — Telephone Encounter (Signed)
-----   Message from Lequita Asal, MD sent at 04/11/2017  8:56 AM EDT ----- Regarding: Please call patient/sister-in-law about IgM  IgM continues to decrease.  M  ----- Message ----- From: Interface, Lab In [place] Sent: 04/10/2017   2:00 PM To: Lequita Asal, MD

## 2017-04-15 IMAGING — CT CT HEAD W/O CM
1 series · 16 of 30 positions shown, 20 images · non-contrast
Comparison: None.

CLINICAL DATA: 64-year-old male with confusion for several months.

EXAM:
CT HEAD WITHOUT CONTRAST
TECHNIQUE: Contiguous axial images were obtained from the base of the skull
through the vertex without intravenous contrast.

[Series 2: head wo · axial · 0.45mm/px · z∈[-107,+23]mm · 16 of 30 slices shown, 20 images]
[im 2/30  brain]
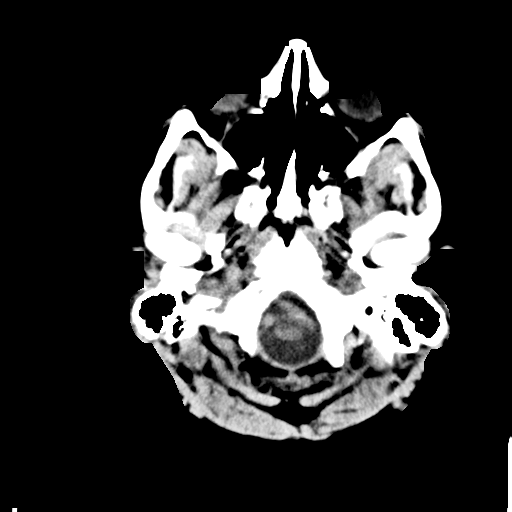
[im 2/30  bone]
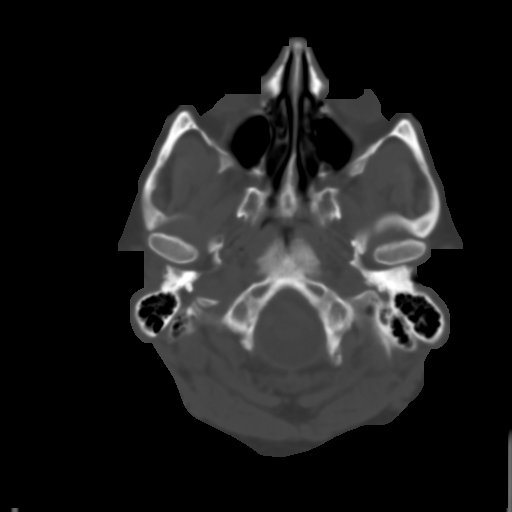
[im 4/30  brain]
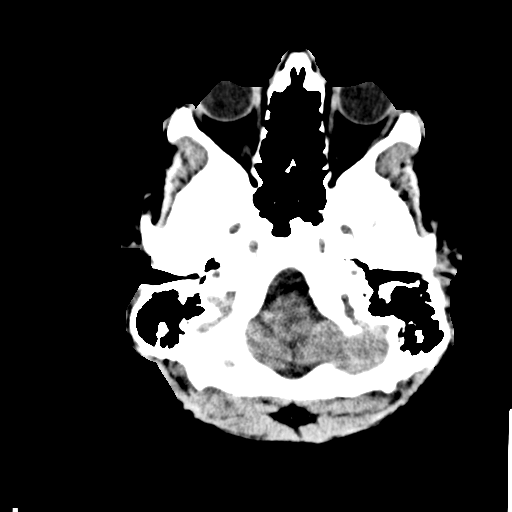
[im 6/30  brain]
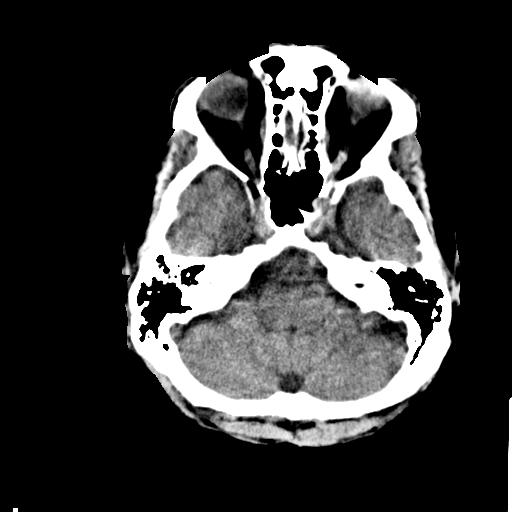
[im 8/30  brain]
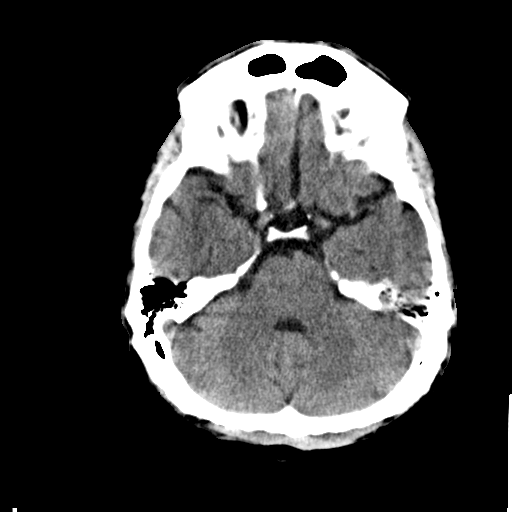
[im 9/30  brain]
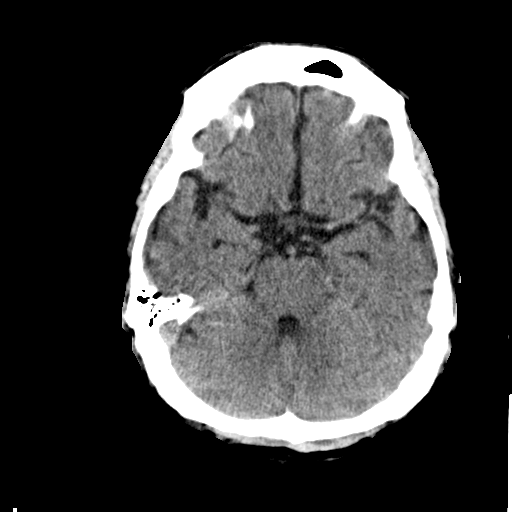
[im 9/30  bone]
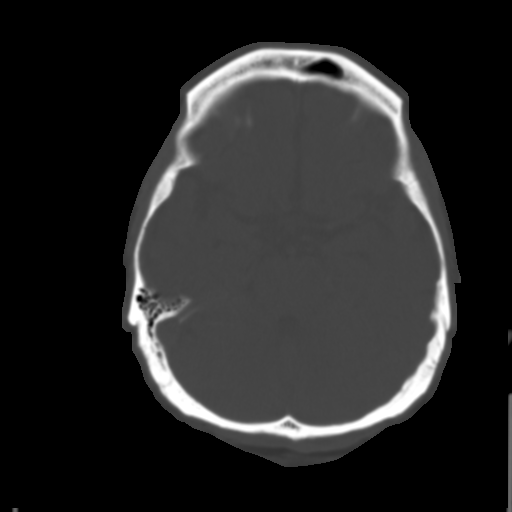
[im 11/30  brain]
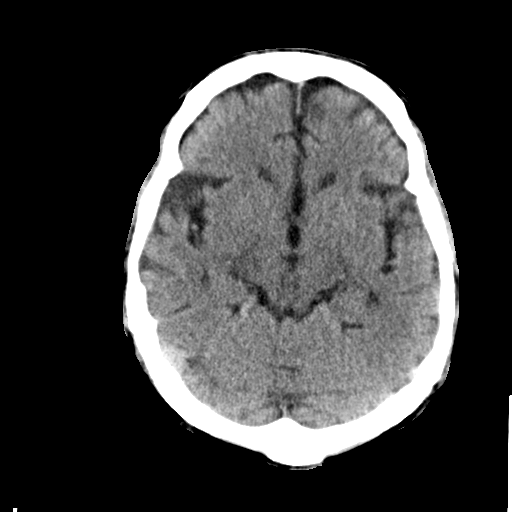
[im 13/30  brain]
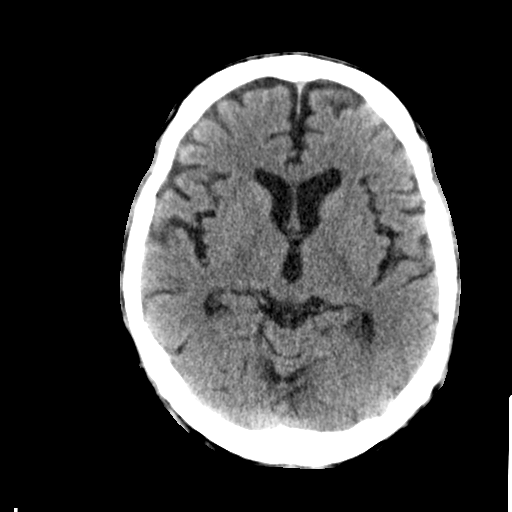
[im 15/30  brain]
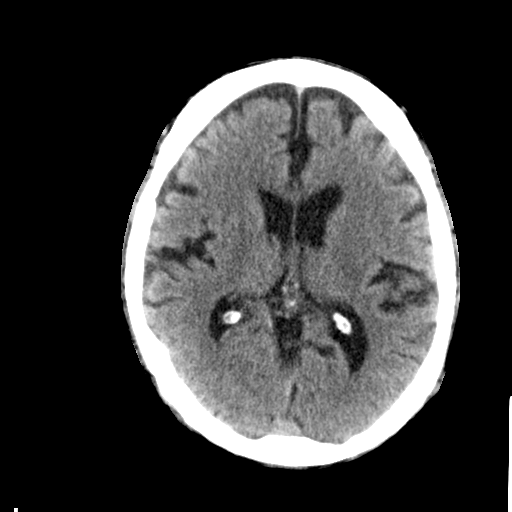
[im 16/30  brain]
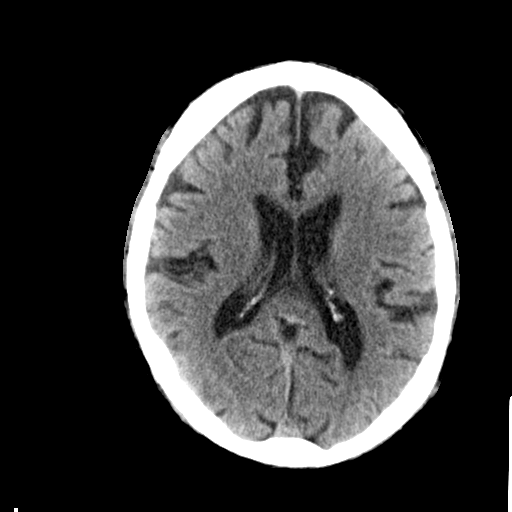
[im 16/30  bone]
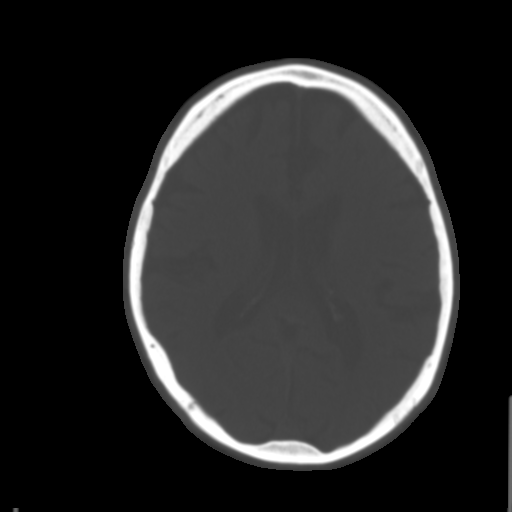
[im 18/30  brain]
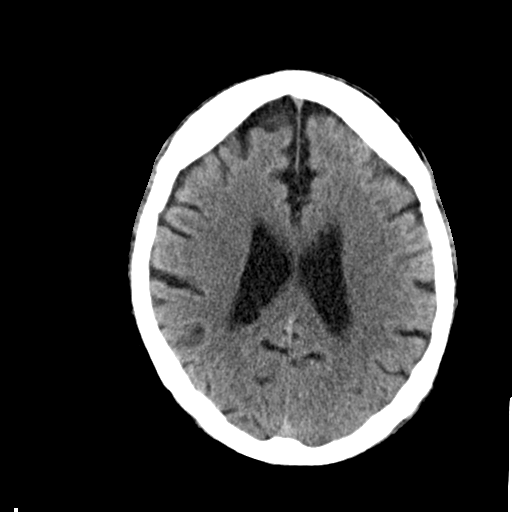
[im 20/30  brain]
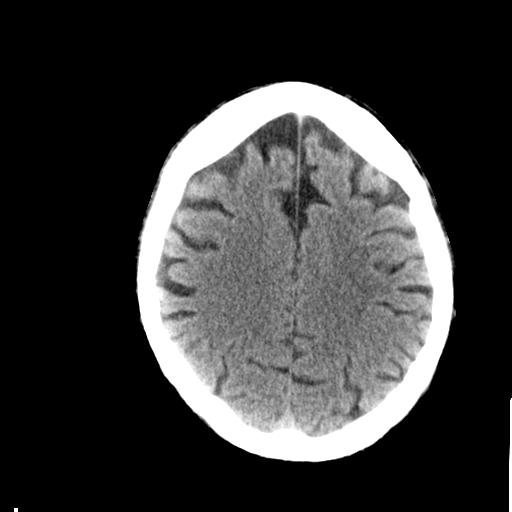
[im 22/30  brain]
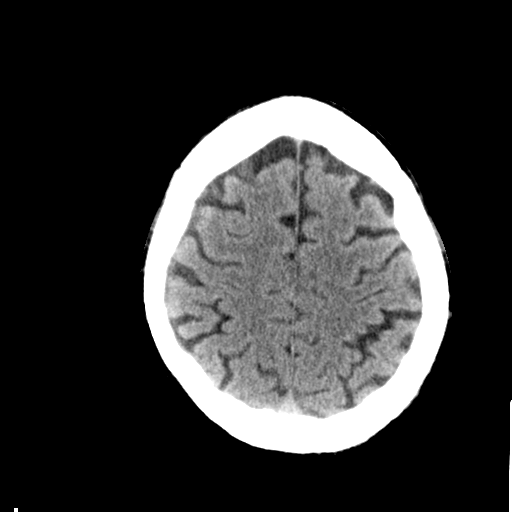
[im 23/30  brain]
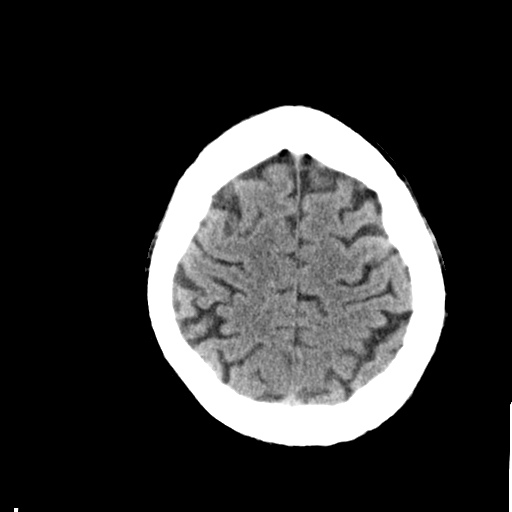
[im 23/30  bone]
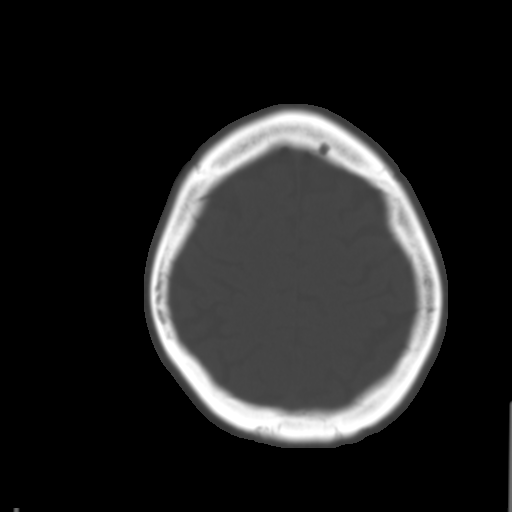
[im 25/30  brain]
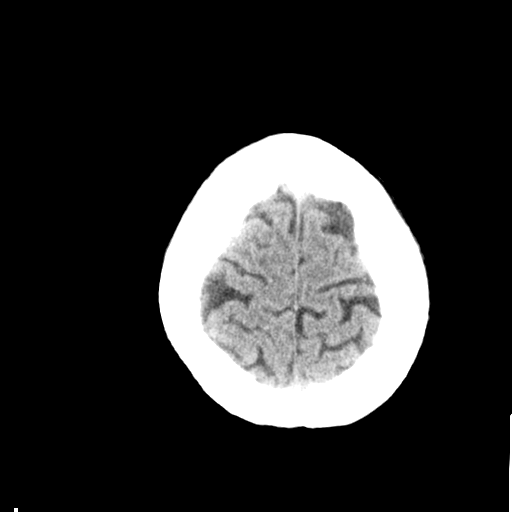
[im 27/30  brain]
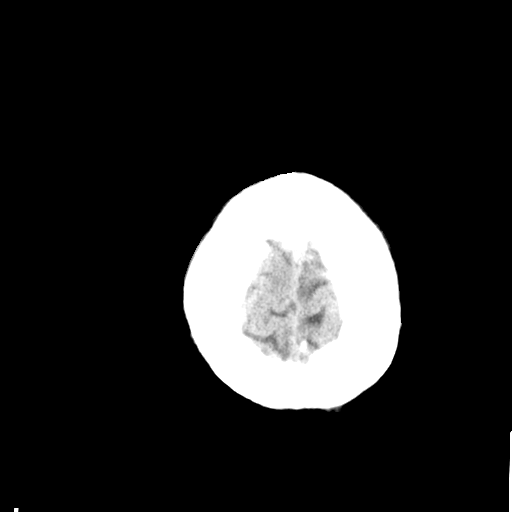
[im 29/30  brain]
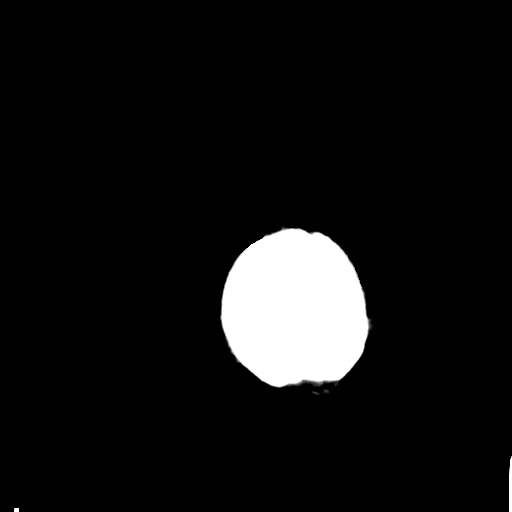

[16 of 30 positions shown; findings below may reference images not displayed]

FINDINGS: Mild generalized cerebral atrophy noted.

No acute intracranial abnormalities are identified, including mass
lesion or mass effect, hydrocephalus, extra-axial fluid collection,
midline shift, hemorrhage, or acute infarction.

The visualized bony calvarium is unremarkable.
IMPRESSION: No evidence of acute intracranial abnormality.

Mild atrophy.

## 2017-05-11 ENCOUNTER — Inpatient Hospital Stay: Payer: BLUE CROSS/BLUE SHIELD | Attending: Hematology and Oncology

## 2017-05-11 DIAGNOSIS — C880 Waldenstrom macroglobulinemia: Secondary | ICD-10-CM | POA: Insufficient documentation

## 2017-05-11 DIAGNOSIS — D472 Monoclonal gammopathy: Secondary | ICD-10-CM | POA: Insufficient documentation

## 2017-05-11 DIAGNOSIS — E538 Deficiency of other specified B group vitamins: Secondary | ICD-10-CM | POA: Insufficient documentation

## 2017-05-11 MED ORDER — CYANOCOBALAMIN 1000 MCG/ML IJ SOLN
1000.0000 ug | Freq: Once | INTRAMUSCULAR | Status: AC
  Administered 2017-05-11: 1000 ug via INTRAMUSCULAR
  Filled 2017-05-11: qty 1

## 2017-05-17 NOTE — Discharge Instructions (Signed)
General Anesthesia, Adult, Care After °These instructions provide you with information about caring for yourself after your procedure. Your health care provider may also give you more specific instructions. Your treatment has been planned according to current medical practices, but problems sometimes occur. Call your health care provider if you have any problems or questions after your procedure. °What can I expect after the procedure? °After the procedure, it is common to have: °· Vomiting. °· A sore throat. °· Mental slowness. ° °It is common to feel: °· Nauseous. °· Cold or shivery. °· Sleepy. °· Tired. °· Sore or achy, even in parts of your body where you did not have surgery. ° °Follow these instructions at home: °For at least 24 hours after the procedure: °· Do not: °? Participate in activities where you could fall or become injured. °? Drive. °? Use heavy machinery. °? Drink alcohol. °? Take sleeping pills or medicines that cause drowsiness. °? Make important decisions or sign legal documents. °? Take care of children on your own. °· Rest. °Eating and drinking °· If you vomit, drink water, juice, or soup when you can drink without vomiting. °· Drink enough fluid to keep your urine clear or pale yellow. °· Make sure you have little or no nausea before eating solid foods. °· Follow the diet recommended by your health care provider. °General instructions °· Have a responsible adult stay with you until you are awake and alert. °· Return to your normal activities as told by your health care provider. Ask your health care provider what activities are safe for you. °· Take over-the-counter and prescription medicines only as told by your health care provider. °· If you smoke, do not smoke without supervision. °· Keep all follow-up visits as told by your health care provider. This is important. °Contact a health care provider if: °· You continue to have nausea or vomiting at home, and medicines are not helpful. °· You  cannot drink fluids or start eating again. °· You cannot urinate after 8-12 hours. °· You develop a skin rash. °· You have fever. °· You have increasing redness at the site of your procedure. °Get help right away if: °· You have difficulty breathing. °· You have chest pain. °· You have unexpected bleeding. °· You feel that you are having a life-threatening or urgent problem. °This information is not intended to replace advice given to you by your health care provider. Make sure you discuss any questions you have with your health care provider. °Document Released: 11/28/2000 Document Revised: 01/25/2016 Document Reviewed: 08/06/2015 °Elsevier Interactive Patient Education © 2018 [employer] ° °

## 2017-05-18 ENCOUNTER — Encounter: Payer: Self-pay | Admitting: *Deleted

## 2017-05-24 ENCOUNTER — Ambulatory Visit: Payer: BLUE CROSS/BLUE SHIELD | Admitting: Anesthesiology

## 2017-05-24 ENCOUNTER — Ambulatory Visit
Admission: RE | Admit: 2017-05-24 | Discharge: 2017-05-24 | Disposition: A | Discharge status: Home / Self Care | Payer: BLUE CROSS/BLUE SHIELD | Source: Ambulatory Visit | Attending: Ophthalmology | Admitting: Ophthalmology

## 2017-05-24 ENCOUNTER — Encounter
Admission: RE | Disposition: A | Discharge status: Home / Self Care | Payer: Self-pay | Source: Ambulatory Visit | Attending: Ophthalmology

## 2017-05-24 DIAGNOSIS — C880 Waldenstrom macroglobulinemia: Secondary | ICD-10-CM | POA: Insufficient documentation

## 2017-05-24 DIAGNOSIS — H401113 Primary open-angle glaucoma, right eye, severe stage: Secondary | ICD-10-CM | POA: Insufficient documentation

## 2017-05-24 DIAGNOSIS — Z7984 Long term (current) use of oral hypoglycemic drugs: Secondary | ICD-10-CM | POA: Diagnosis not present

## 2017-05-24 DIAGNOSIS — I10 Essential (primary) hypertension: Secondary | ICD-10-CM | POA: Insufficient documentation

## 2017-05-24 DIAGNOSIS — E1151 Type 2 diabetes mellitus with diabetic peripheral angiopathy without gangrene: Secondary | ICD-10-CM | POA: Insufficient documentation

## 2017-05-24 DIAGNOSIS — E538 Deficiency of other specified B group vitamins: Secondary | ICD-10-CM | POA: Insufficient documentation

## 2017-05-24 DIAGNOSIS — Z79899 Other long term (current) drug therapy: Secondary | ICD-10-CM | POA: Diagnosis not present

## 2017-05-24 DIAGNOSIS — F0390 Unspecified dementia without behavioral disturbance: Secondary | ICD-10-CM | POA: Insufficient documentation

## 2017-05-24 HISTORY — PX: PHOTOCOAGULATION WITH LASER: SHX6027

## 2017-05-24 HISTORY — DX: Waldenstrom macroglobulinemia: C88.0

## 2017-05-24 HISTORY — DX: Type 2 diabetes mellitus without complications: E11.9

## 2017-05-24 HISTORY — DX: Polyneuropathy, unspecified: G62.9

## 2017-05-24 HISTORY — DX: Waldenstrom macroglobulinemia not having achieved remission: C88.00

## 2017-05-24 LAB — GLUCOSE, CAPILLARY
Glucose-Capillary: 128 mg/dL — ABNORMAL HIGH (ref 65–99)
Glucose-Capillary: 142 mg/dL — ABNORMAL HIGH (ref 65–99)

## 2017-05-24 SURGERY — PHOTOCOAGULATION, EYE, USING LASER
Anesthesia: General | Laterality: Right | Wound class: Clean

## 2017-05-24 MED ORDER — LIDOCAINE HCL 2 % IJ SOLN: INTRAMUSCULAR | Status: DC | PRN

## 2017-05-24 MED ORDER — ALFENTANIL 500 MCG/ML IJ INJ
INJECTION | INTRAVENOUS | Status: DC | PRN
  Administered 2017-05-24: 1000 ug via INTRAVENOUS

## 2017-05-24 MED ORDER — MIDAZOLAM HCL 2 MG/2ML IJ SOLN
INTRAMUSCULAR | Status: DC | PRN
  Administered 2017-05-24: 2 mg via INTRAVENOUS

## 2017-05-24 MED ORDER — NEOMYCIN-POLYMYXIN-DEXAMETH 3.5-10000-0.1 OP OINT
TOPICAL_OINTMENT | OPHTHALMIC | Status: DC | PRN
  Administered 2017-05-24: 1 via OPHTHALMIC

## 2017-05-24 MED ORDER — FENTANYL CITRATE (PF) 100 MCG/2ML IJ SOLN: 25.0000 ug | INTRAMUSCULAR | Status: DC | PRN

## 2017-05-24 MED ORDER — OXYCODONE HCL 5 MG PO TABS: 5.0000 mg | ORAL_TABLET | Freq: Once | ORAL | Status: DC | PRN

## 2017-05-24 MED ORDER — PROMETHAZINE HCL 25 MG/ML IJ SOLN: 6.2500 mg | INTRAMUSCULAR | Status: DC | PRN

## 2017-05-24 MED ORDER — MEPERIDINE HCL 25 MG/ML IJ SOLN
6.2500 mg | INTRAMUSCULAR | Status: DC | PRN
Start: 2017-05-24 — End: 2017-05-24

## 2017-05-24 MED ORDER — LACTATED RINGERS IV SOLN: 10.0000 mL/h | INTRAVENOUS | Status: DC

## 2017-05-24 MED ORDER — OXYCODONE HCL 5 MG/5ML PO SOLN: 5.0000 mg | Freq: Once | ORAL | Status: DC | PRN

## 2017-05-24 SURGICAL SUPPLY — 11 items
BANDAGE EYE OVAL (MISCELLANEOUS) ×6 IMPLANT
DEVICE G-PROBE SGL USE (Laser) IMPLANT
DEVICE MICRO PULS P3 SGL USE (Laser) ×3 IMPLANT
G-PROBE SGL USE (Laser)
GAUZE SPONGE 4X4 12PLY STRL (GAUZE/BANDAGES/DRESSINGS) ×3 IMPLANT
NDL RETROBULBAR .5 NSTRL (NEEDLE) ×3 IMPLANT
NEEDLE FILTER BLUNT 18X 1/2SAF (NEEDLE) ×2
NEEDLE FILTER BLUNT 18X1 1/2 (NEEDLE) ×1 IMPLANT
SYR 5ML LL (SYRINGE) ×3 IMPLANT
WATER STERILE IRR 250ML POUR (IV SOLUTION) ×3 IMPLANT
WATER STERILE IRR 500ML POUR (IV SOLUTION) IMPLANT

## 2017-05-24 NOTE — Anesthesia Preprocedure Evaluation (Addendum)
Anesthesia Evaluation  Patient identified by MRN, date of birth, ID band Patient awake    Reviewed: Allergy & Precautions, H&P , NPO status , Patient's Chart, lab work & pertinent test results, reviewed documented beta blocker date and time   Airway Mallampati: II  TM Distance: >3 FB Neck ROM: full    Dental no notable dental hx.    Pulmonary neg pulmonary ROS,    Pulmonary exam normal breath sounds clear to auscultation       Cardiovascular Exercise Tolerance: Good hypertension, + Peripheral Vascular Disease   Rhythm:regular Rate:Normal     Neuro/Psych PSYCHIATRIC DISORDERS Depression Dementia - very poor historian and unable to give a good historyNeuropathy    GI/Hepatic negative GI ROS, Neg liver ROS,   Endo/Other  diabetes  Renal/GU Renal disease  negative genitourinary   Musculoskeletal   Abdominal   Peds  Hematology negative hematology ROS (+)   Anesthesia Other Findings Waldenstrom's macroglobulinemia    Reproductive/Obstetrics negative OB ROS                            Anesthesia Physical Anesthesia Plan  ASA: III  Anesthesia Plan: General   Post-op Pain Management:    Induction:   PONV Risk Score and Plan:   Airway Management Planned:   Additional Equipment:   Intra-op Plan:   Post-operative Plan:   Informed Consent: I have reviewed the patients History and Physical, chart, labs and discussed the procedure including the risks, benefits and alternatives for the proposed anesthesia with the patient or authorized representative who has indicated his/her understanding and acceptance.   Dental Advisory Given  Plan Discussed with: CRNA  Anesthesia Plan Comments:        Anesthesia Quick Evaluation

## 2017-05-24 NOTE — Anesthesia Postprocedure Evaluation (Signed)
Anesthesia Post Note  Patient: Adrian Valdez  Procedure(s) Performed: Procedure(s) (LRB): PHOTOCOAGULATION WITH LASER (Right)  Patient location during evaluation: PACU Anesthesia Type: General Level of consciousness: awake and alert Pain management: pain level controlled Vital Signs Assessment: post-procedure vital signs reviewed and stable Respiratory status: spontaneous breathing, nonlabored ventilation, respiratory function stable and patient connected to nasal cannula oxygen Cardiovascular status: blood pressure returned to baseline and stable Postop Assessment: no apparent nausea or vomiting Anesthetic complications: no    SCOURAS, NICOLE ELAINE

## 2017-05-24 NOTE — Op Note (Signed)
DATE OF SURGERY: 05/24/2017  PREOPERATIVE DIAGNOSES: Severe stage primary open angle glaucoma, right eye.      H40.11x3  POSTOPERATIVE DIAGNOSES: Same  PROCEDURES PERFORMED: Transscleral diode cyclophotocoagulation, right eye  SURGEON: Mali Cristan Hout, M.D.  ANESTHESIA: Retrobulbar block of Xylocaine and Bupivacaine and Hyaluronidase  COMPLICATIONS: None.  INDICATIONS FOR PROCEDURE: Adrian Valdez is a 66 y.o. year-old male with uncontrolled primary open angle glaucoma. The risks and benefits of glaucoma surgery were discussed with the patient, and he consented for a diode laser surgery.  PROCEDURE IN DETAIL: The eye for surgery was verified during the time-out procedure in the operating room. A retrobulbar block of lidocaine, Marcaine, and hyaluronidase was done for anesthesia. A micropulse probe was applied to each hemilimbus with the following settings: 2010mW, 31.3% duty cycle, 120 seconds.The eye was patched closed. The patient tolerated the procedure well and was transferred to the Post-operative Care Unit in stable condition.

## 2017-05-24 NOTE — Anesthesia Procedure Notes (Signed)
Procedure Name: MAC Performed by: Shedrick Sarli Pre-anesthesia Checklist: Patient identified, Emergency Drugs available, Suction available, Timeout performed and Patient being monitored Patient Re-evaluated:Patient Re-evaluated prior to inductionOxygen Delivery Method: Nasal cannula Placement Confirmation: positive ETCO2     

## 2017-05-24 NOTE — Transfer of Care (Signed)
Immediate Anesthesia Transfer of Care Note  Patient: Adrian Valdez  Procedure(s) Performed: Procedure(s) with comments: PHOTOCOAGULATION WITH LASER (Right) - Diabetic - oral meds  Patient Location: PACU  Anesthesia Type: General  Level of Consciousness: awake, alert  and patient cooperative  Airway and Oxygen Therapy: Patient Spontanous Breathing and Patient connected to supplemental oxygen  Post-op Assessment: Post-op Vital signs reviewed, Patient's Cardiovascular Status Stable, Respiratory Function Stable, Patent Airway and No signs of Nausea or vomiting  Post-op Vital Signs: Reviewed and stable  Complications: No apparent anesthesia complications

## 2017-05-24 NOTE — H&P (Signed)
The History and Physical notes are on paper, have been signed, and are to be scanned. The patient remains stable and unchanged from the H&P.   Previous H&P reviewed, patient examined, and there are no changes.  Adrian Valdez 05/24/2017 9:14 AM

## 2017-05-25 ENCOUNTER — Encounter: Payer: Self-pay | Admitting: Ophthalmology

## 2017-06-12 ENCOUNTER — Inpatient Hospital Stay: Payer: BLUE CROSS/BLUE SHIELD | Attending: Hematology and Oncology

## 2017-06-12 DIAGNOSIS — E538 Deficiency of other specified B group vitamins: Secondary | ICD-10-CM | POA: Diagnosis not present

## 2017-06-12 DIAGNOSIS — C880 Waldenstrom macroglobulinemia: Secondary | ICD-10-CM | POA: Diagnosis present

## 2017-06-12 DIAGNOSIS — D472 Monoclonal gammopathy: Secondary | ICD-10-CM | POA: Diagnosis not present

## 2017-06-12 MED ORDER — CYANOCOBALAMIN 1000 MCG/ML IJ SOLN
1000.0000 ug | Freq: Once | INTRAMUSCULAR | Status: AC
  Administered 2017-06-12: 1000 ug via INTRAMUSCULAR
  Filled 2017-06-12: qty 1

## 2017-07-13 ENCOUNTER — Ambulatory Visit: Payer: BLUE CROSS/BLUE SHIELD

## 2017-07-13 ENCOUNTER — Other Ambulatory Visit: Payer: BLUE CROSS/BLUE SHIELD

## 2017-07-13 ENCOUNTER — Ambulatory Visit: Payer: BLUE CROSS/BLUE SHIELD | Admitting: Hematology and Oncology

## 2017-07-18 ENCOUNTER — Inpatient Hospital Stay: Payer: BLUE CROSS/BLUE SHIELD | Attending: Hematology and Oncology | Admitting: Hematology and Oncology

## 2017-07-18 ENCOUNTER — Encounter: Payer: Self-pay | Admitting: Hematology and Oncology

## 2017-07-18 ENCOUNTER — Other Ambulatory Visit: Payer: Self-pay | Admitting: *Deleted

## 2017-07-18 ENCOUNTER — Other Ambulatory Visit: Payer: Self-pay | Admitting: Hematology and Oncology

## 2017-07-18 ENCOUNTER — Inpatient Hospital Stay: Payer: BLUE CROSS/BLUE SHIELD

## 2017-07-18 VITALS — BP 144/86 | HR 67 | Temp 97.1°F | Resp 18 | Wt 210.0 lb

## 2017-07-18 DIAGNOSIS — E538 Deficiency of other specified B group vitamins: Secondary | ICD-10-CM | POA: Diagnosis not present

## 2017-07-18 DIAGNOSIS — R4182 Altered mental status, unspecified: Secondary | ICD-10-CM

## 2017-07-18 DIAGNOSIS — G4700 Insomnia, unspecified: Secondary | ICD-10-CM | POA: Diagnosis not present

## 2017-07-18 DIAGNOSIS — H409 Unspecified glaucoma: Secondary | ICD-10-CM | POA: Diagnosis not present

## 2017-07-18 DIAGNOSIS — Z803 Family history of malignant neoplasm of breast: Secondary | ICD-10-CM | POA: Diagnosis not present

## 2017-07-18 DIAGNOSIS — K5730 Diverticulosis of large intestine without perforation or abscess without bleeding: Secondary | ICD-10-CM | POA: Insufficient documentation

## 2017-07-18 DIAGNOSIS — K7689 Other specified diseases of liver: Secondary | ICD-10-CM | POA: Insufficient documentation

## 2017-07-18 DIAGNOSIS — Z79899 Other long term (current) drug therapy: Secondary | ICD-10-CM | POA: Diagnosis not present

## 2017-07-18 DIAGNOSIS — N400 Enlarged prostate without lower urinary tract symptoms: Secondary | ICD-10-CM | POA: Insufficient documentation

## 2017-07-18 DIAGNOSIS — E119 Type 2 diabetes mellitus without complications: Secondary | ICD-10-CM

## 2017-07-18 DIAGNOSIS — C880 Waldenstrom macroglobulinemia: Secondary | ICD-10-CM | POA: Diagnosis not present

## 2017-07-18 DIAGNOSIS — D472 Monoclonal gammopathy: Secondary | ICD-10-CM

## 2017-07-18 DIAGNOSIS — F0390 Unspecified dementia without behavioral disturbance: Secondary | ICD-10-CM | POA: Diagnosis not present

## 2017-07-18 DIAGNOSIS — I10 Essential (primary) hypertension: Secondary | ICD-10-CM | POA: Insufficient documentation

## 2017-07-18 DIAGNOSIS — Z8041 Family history of malignant neoplasm of ovary: Secondary | ICD-10-CM

## 2017-07-18 DIAGNOSIS — G629 Polyneuropathy, unspecified: Secondary | ICD-10-CM | POA: Diagnosis not present

## 2017-07-18 LAB — CBC WITH DIFFERENTIAL/PLATELET
Basophils Absolute: 0.1 10*3/uL (ref 0–0.1)
Basophils Relative: 1 %
Eosinophils Absolute: 0.2 10*3/uL (ref 0–0.7)
Eosinophils Relative: 3 %
HCT: 40.9 % (ref 40.0–52.0)
Hemoglobin: 13.7 g/dL (ref 13.0–18.0)
Lymphocytes Relative: 16 %
Lymphs Abs: 1 10*3/uL (ref 1.0–3.6)
MCH: 31 pg (ref 26.0–34.0)
MCHC: 33.4 g/dL (ref 32.0–36.0)
MCV: 92.7 fL (ref 80.0–100.0)
Monocytes Absolute: 0.3 10*3/uL (ref 0.2–1.0)
Monocytes Relative: 6 %
Neutro Abs: 4.5 10*3/uL (ref 1.4–6.5)
Neutrophils Relative %: 74 %
Platelets: 219 10*3/uL (ref 150–440)
RBC: 4.42 MIL/uL (ref 4.40–5.90)
RDW: 14.2 % (ref 11.5–14.5)
WBC: 6.1 10*3/uL (ref 3.8–10.6)

## 2017-07-18 LAB — VITAMIN B12: Vitamin B-12: 435 pg/mL (ref 180–914)

## 2017-07-18 LAB — FOLATE: Folate: 27 ng/mL (ref >5.9)

## 2017-07-18 MED ORDER — CYANOCOBALAMIN 1000 MCG/ML IJ SOLN
1000.0000 ug | Freq: Once | INTRAMUSCULAR | Status: AC
  Administered 2017-07-18: 1000 ug via INTRAMUSCULAR
  Filled 2017-07-18: qty 1

## 2017-07-18 NOTE — Progress Notes (Signed)
[facility] day:  07/18/2017  Chief Complaint: Adrian Valdez is a 66 y.o. male with Waldenstrom's macroglobulinemia who is seen for 3 month assessment and continuation of monthly B12.  HPI:  The patient was last seen in the medical oncology clinic on 04/10/2017.  At that time, his memory remained poor.  He had a neuropathy in his feet.  He was trying Neurontin.  Exam revealed no adenopathy or hepatosplenomegaly.  M spike was 0.5 gm/dL.  He saw Dr. Manuella Ghazi on 04/27/2017.  He continued donepezil (Aricept) for his dementia.  Neurontin was increased to 300 mg TID for his neuropathy.  He has continued monthly B12 (05/11/2017 and 06/12/2017).  Symptomatically, he denies any complaints.  He states that he is wearing flip flops (despite the cold weather) secondary to his neuropathy.  He went to [facility] 1 month ago and is now off drops for his h/o glaucoma.  He is off Metformin.  He has had no improvement in his memory.  He denies any B symptoms.   Past Medical History:  Diagnosis Date  . 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 B12 deficiency 07/27/2015  . Dementia   . Diabetes mellitus, type 2 ([place])   . Enlarged prostate   . Glaucoma   . Glaucoma   . Hypertension   . Neuropathy    feet and legs  . Urinary frequency   . Vitamin B 12 deficiency   . Waldenstrom's macroglobulinemia [facility])     Past Surgical History:  Procedure Laterality Date  . CATARACT EXTRACTION W/ INTRAOCULAR LENS IMPLANT    . COLONOSCOPY    . KNEE ARTHROSCOPY Right   . LASIK      Family History  Problem Relation Age of Onset  . Hypertension Mother   . Hypertension Father   . CAD Father   . Breast cancer Maternal Grandmother   . Ovarian cancer Paternal Grandmother   . Kidney disease Neg Hx   . Prostate cancer Neg Hx     Social History:  reports that  has never smoked. he has never used smokeless tobacco. He reports that he drinks alcohol. He  reports that he does not use drugs.  He notes that he has 6 PhDs in fine arts, music, etc from the [employer].  He lives with his parents.  The patient is accompanied by his father today.  Allergies:  Allergies  Allergen Reactions  . Oxybutynin Other (See Comments)    Headache  . Myrbetriq [Mirabegron] Other (See Comments)    Other reaction(s): Other (See Comments) migraine migraine     Current Medications: Current Outpatient Medications  Medication Sig Dispense Refill  . finasteride (PROSCAR) 5 MG tablet Take 1 tablet (5 mg total) by mouth daily. Reported on 11/30/2015 90 tablet 3  . lisinopril (PRINIVIL,ZESTRIL) 10 MG tablet Take by mouth. Reported on 11/30/2015    . metFORMIN (GLUMETZA) 500 MG (MOD) 24 hr tablet Take 500 mg by mouth. Take 2 tablets (1000 mg) by mouth daily with dinner.    . methazolamide (NEPTAZANE) 50 MG tablet TK 1 T PO BID  1  . tamsulosin (FLOMAX) 0.4 MG CAPS capsule TAKE ONE CAPSULE BY MOUTH ONCE DAILY, 30 MINUTES AFTER THE SAME MEAL EACH DAY    . acyclovir (ZOVIRAX) 400 MG tablet Take 1 tablet (400 mg total) by mouth 2 (two) times daily. (Patient not taking: Reported on 05/18/2017) 60 tablet  3  . donepezil (ARICEPT) 5 MG tablet Take 5 mg by mouth at bedtime.     . gabapentin (NEURONTIN) 300 MG capsule Take 300 mg by mouth 3 (three) times daily.    Marland Kitchen glimepiride (AMARYL) 2 MG tablet Take 2 mg by mouth 2 (two) times daily.    Marland Kitchen nystatin (MYCOSTATIN) 100000 UNIT/ML suspension Take 5 mLs (500,000 Units total) by mouth 4 (four) times daily. (Patient not taking: Reported on 05/18/2017) 200 mL 1  . timolol (BETIMOL) 0.5 % ophthalmic solution Apply to eye. Reported on 11/30/2015    . Travoprost, BAK Free, (TRAVATAN) 0.004 % SOLN ophthalmic solution 1 drop at bedtime.     No current facility-administered medications for this visit.     Review of Systems:  GENERAL:  Feels "ok".  No fevers or sweats.  Weight up 2  pounds. PERFORMANCE STATUS (ECOG):  2 HEENT: Glaucoma, off drops (see HPI).  No runny nose, sore throat, mouth sores or tenderness. Lungs: No shortness of breath or cough.  No hemoptysis. Cardiac:  No chest pain, palpitations, orthopnea, or PND. GI:  No nausea, vomiting, diarrhea, constipation, melena or hematochezia. GU:  Enlarged prostate with chronic urgency, frequency, burning (no change).  No hematuria. Sees urology. Musculoskeletal:  No back pain.  Right knee/hip issues with upcoming plan to see orhtopedics.  No muscle tenderness. Extremities:  No pain or swelling. Skin:  No rashes or ulcers.  No bruises. Neuro:  Trouble thinking (no change).  Neuropathy in feet (see HPI).  No headache, numbness or weakness, balance or coordination issues. Endocrine:  Diabetes off Metformin.  No thyroid issues, hot flashes or night sweats. Psych:  Poor sleep.  ? Depression- seeing psychiatry.  No mood changes or anxiety. Pain:  No focal pain. Review of systems:  All other systems reviewed and found to be negative.  Physical Exam: BP (!) 144/86 (BP Location: Left Arm, Patient Position: Sitting)   Pulse 67   Temp (!) 97.1 F (36.2 C) (Tympanic)   Resp 18   Wt 210 lb (95.3 kg)   BMI 31.01 kg/m  GENERAL:  Well developed, well nourished, gentleman sitting comfortably in the exam room. MENTAL STATUS:  Alert and oriented to person and place. Unsure of year.  Knows it is November. HEAD:  Pearline Cables hair.  Male pattern baldness.  Normocephalic, atraumatic, face symmetric, no Cushingoid features. EYES:  Wearing dark glasses.  Mild medial right upper lid erythema.  Pupils equal round and reactive to light and accomodation.  No conjunctivitis or scleral icterus. ENT:  No oral lesions.  Tongue normal. Mucous membranes moist.  RESPIRATORY:  Clear to auscultation without rales, wheezes or rhonchi. CARDIOVASCULAR:  Regular rate and rhythm without murmur, rub or gallop. ABDOMEN:  Soft, non-tender, with active bowel  sounds, and no hepatosplenomegaly.  No masses. SKIN:  Mild facial rash (dry).  No bruising or petechiae.  No ulcers or lesions. EXTREMITIES:  No edema, skin discoloration or tenderness.  No palpable cords. LYMPH NODES: No palpable cervical, supraclavicular, axillary or inguinal adenopathy  NEUROLOGICAL:  Stable. PSYCH:  Flat affect. Answers questions appropriately.   Appointment on 07/18/2017  Component Date Value Ref Range Status  . WBC 07/18/2017 6.1  3.8 - 10.6 K/uL Final  . RBC 07/18/2017 4.42  4.40 - 5.90 MIL/uL Final  . Hemoglobin 07/18/2017 13.7  13.0 - 18.0 g/dL Final  . HCT 07/18/2017 40.9  40.0 - 52.0 % Final  . MCV 07/18/2017 92.7  80.0 - 100.0 fL Final  .  MCH 07/18/2017 31.0  26.0 - 34.0 pg Final  . MCHC 07/18/2017 33.4  32.0 - 36.0 g/dL Final  . RDW 07/18/2017 14.2  11.5 - 14.5 % Final  . Platelets 07/18/2017 219  150 - 440 K/uL Final  . Neutrophils Relative % 07/18/2017 74  % Final  . Neutro Abs 07/18/2017 4.5  1.4 - 6.5 K/uL Final  . Lymphocytes Relative 07/18/2017 16  % Final  . Lymphs Abs 07/18/2017 1.0  1.0 - 3.6 K/uL Final  . Monocytes Relative 07/18/2017 6  % Final  . Monocytes Absolute 07/18/2017 0.3  0.2 - 1.0 K/uL Final  . Eosinophils Relative 07/18/2017 3  % Final  . Eosinophils Absolute 07/18/2017 0.2  0 - 0.7 K/uL Final  . Basophils Relative 07/18/2017 1  % Final  . Basophils Absolute 07/18/2017 0.1  0 - 0.1 K/uL Final    Assessment:  Sencere Bodin Gorka is a 66 y.o. 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  Work-up on 08/15/2016 revealed an IgM of 3808.  Serum viscosity was 2.5 (1.6-1.9).  Beta2-microglobulin was 2.5 (0.6-2.4).  Negative studies included:  hepatitis B surface antigen, hepatitis B core antibody, hepatitis C antibody, LDH, and CRP.   Bone marrow biopsy on 08/23/2016 revealed a mature B-cell  lymphoma with plasmacytic differentiation, predominantly paratrabecular infiltrate of lymphoplasmacytic infiltrate accounts for 10-15% of marrow space involvement.  Marrow was normocellular for age (50-60%) with trilineage hematopoiesis.  There was mild to moderate paratrabecular increase in reticulin fibers associated with lymphoplasmacytic infiltrate.  [place] red stain was negative for amyloid.  Storage iron was present.    Bone marrow flow cytometry revealed a monotypic B-cell population (0.6% of sample) with a non-specific immunophenotype.  There was abnormal/monotypic plasma cell population (< 0.1% of sample).  Cytogenetics were normal (46, XY).  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  Bone survey on 08/18/2016 revealed no evidence of bony metastatic disease.  There are no CRAB criteria (hypercalciumia, renal insufficiency, anemia or bone lesions).  Concern is raised for possible contribution of Waldenstrom's to decline in performance status (mentation).  Serum viscosity is mildly elevated.  He has no palpable adenopathy or hepatosplenomegaly.   SPEP has been monitored:  1.6 gm/dL on 07/09/2015, 2.4 on 07/24/2015, 2.8 on 08/11/2016, 2.0 on 10/31/2016, 1.8 on 11/21/2016, 1.1 on 01/02/2017, 0.7 on 01/31/2017, and 0.5 on 04/10/2017.  IgM has been followed:  3808 on 08/15/2016, 3531 on 10/25/2016, 2757 on 11/21/2016, 1740 on 01/02/2017, 1067 on 01/31/2017, and 911 on 04/10/2017.  Kappa free light chains have been monitored:  56.65 (ratio of 5.27) on 07/24/2015 and 70.3 (ratio 6.28) on 08/08/2016.  Chest, abdomen and pelvic CT scan on 08/12/2015 revealed no radiolucent bone lesions. There was no mediastinal or hilar adenopathy.  The ascending aorta measured 4.2 cm in diameter.  Chest, abdomen, and pelvic CT scan on 09/12/2016 revealed no adenopathy or hepatosplenomegaly.  There was mild fusiform enlargement of the ascending aorta (4.2 x 4.1 cm).  There were numerous hepatic  cysts, colonic diverticulosis, an enlarged prostate with partial bladder outlet obstruction, and bilateral inguinal hernias.  Head MRI on 09/08/2016 revealed no acute or reversible findings.  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  There was mild to moderate cerebral atrophy.  EEG on 09/27/2016 revealed right temporal epileptiform discharge and left temporal focal slowing.  Paraneoplastic panel was negative.  He has not  had an LP.  He has B12 deficiency on supplementation.  B12 was 154 on 07/24/2015 with an elevated MMA.  Folate was 31 and B12 was 246 (low normal) on 08/15/2016.  MMA was 226 (normal) on 08/30/2016.  He receives B12 monthly (last 06/12/2017).  He received 4 cycles of  Velcade, Decadron, and Rituxan (10/31/2016 - 01/02/2017).  He is tolerated treatment well.  Symptomatically, his memory remains poor.  He has a neuropathy in his feet on neurontin.  Exam reveals no adenopathy or hepatosplenomegaly.   Plan: 1.  Labs today:  CBC with diff, CMP, SPEP, IgM, B1, folate, intrinsic factor antibody, anti-parietal antibody. 2.  Discuss M-spike and IgM level.  Discuss continued observation. 3.  Follow-up as scheduled with neurology. 4.  B12 today and monthly x 3. 5.  Anticipate follow-up every 6 months after next visit. 6.  RTC in 4 months for MD assessment, labs (CBC with diff, SPEP, IgM), and B12.   Lequita Asal, MD  07/18/2017, 10:17 AM

## 2017-07-18 NOTE — Progress Notes (Signed)
In today for follow up with caregiver/father.  Pt denies any difficulties and voiced no concerns to RN.

## 2017-07-19 LAB — PROTEIN ELECTROPHORESIS, SERUM
A/G Ratio: 1.5 (ref 0.7–1.7)
Albumin ELP: 3.8 g/dL (ref 2.9–4.4)
Alpha-1-Globulin: 0.2 g/dL (ref 0.0–0.4)
Alpha-2-Globulin: 0.6 g/dL (ref 0.4–1.0)
Beta Globulin: 0.8 g/dL (ref 0.7–1.3)
Gamma Globulin: 1 g/dL (ref 0.4–1.8)
Globulin, Total: 2.6 g/dL (ref 2.2–3.9)
M-Spike, %: 0.7 g/dL — ABNORMAL HIGH
Total Protein ELP: 6.4 g/dL (ref 6.0–8.5)

## 2017-07-19 LAB — IGM: IgM (Immunoglobulin M), Srm: 891 mg/dL — ABNORMAL HIGH (ref 20–172)

## 2017-07-19 LAB — ANTI-PARIETAL ANTIBODY: Parietal Cell Antibody-IgG: 2 Units (ref 0.0–20.0)

## 2017-07-19 LAB — INTRINSIC FACTOR ANTIBODIES: Intrinsic Factor: 1.1 AU/mL (ref 0.0–1.1)

## 2017-07-20 LAB — VITAMIN B1: Vitamin B1 (Thiamine): 148.9 nmol/L (ref 66.5–200.0)

## 2017-08-17 ENCOUNTER — Inpatient Hospital Stay: Payer: BLUE CROSS/BLUE SHIELD | Attending: Hematology and Oncology

## 2017-09-14 ENCOUNTER — Inpatient Hospital Stay: Payer: BLUE CROSS/BLUE SHIELD | Attending: Hematology and Oncology

## 2017-09-14 DIAGNOSIS — E538 Deficiency of other specified B group vitamins: Secondary | ICD-10-CM | POA: Insufficient documentation

## 2017-09-14 MED ORDER — CYANOCOBALAMIN 1000 MCG/ML IJ SOLN
1000.0000 ug | Freq: Once | INTRAMUSCULAR | Status: AC
  Administered 2017-09-14: 1000 ug via INTRAMUSCULAR
  Filled 2017-09-14: qty 1

## 2017-10-12 ENCOUNTER — Inpatient Hospital Stay: Payer: BLUE CROSS/BLUE SHIELD | Attending: Hematology and Oncology

## 2017-10-12 DIAGNOSIS — E538 Deficiency of other specified B group vitamins: Secondary | ICD-10-CM | POA: Insufficient documentation

## 2017-10-12 MED ORDER — CYANOCOBALAMIN 1000 MCG/ML IJ SOLN
1000.0000 ug | Freq: Once | INTRAMUSCULAR | Status: AC
  Administered 2017-10-12: 1000 ug via INTRAMUSCULAR

## 2017-11-16 ENCOUNTER — Ambulatory Visit: Payer: BLUE CROSS/BLUE SHIELD

## 2017-11-16 ENCOUNTER — Inpatient Hospital Stay: Payer: BLUE CROSS/BLUE SHIELD | Attending: Hematology and Oncology

## 2017-11-16 ENCOUNTER — Other Ambulatory Visit: Payer: BLUE CROSS/BLUE SHIELD

## 2017-11-16 ENCOUNTER — Ambulatory Visit: Payer: BLUE CROSS/BLUE SHIELD | Admitting: Urgent Care

## 2017-11-16 ENCOUNTER — Inpatient Hospital Stay (HOSPITAL_BASED_OUTPATIENT_CLINIC_OR_DEPARTMENT_OTHER): Payer: BLUE CROSS/BLUE SHIELD | Admitting: Urgent Care

## 2017-11-16 ENCOUNTER — Inpatient Hospital Stay: Payer: BLUE CROSS/BLUE SHIELD

## 2017-11-16 VITALS — BP 122/77 | HR 57 | Temp 97.2°F | Resp 18 | Wt 212.5 lb

## 2017-11-16 DIAGNOSIS — E538 Deficiency of other specified B group vitamins: Secondary | ICD-10-CM | POA: Diagnosis not present

## 2017-11-16 DIAGNOSIS — G629 Polyneuropathy, unspecified: Secondary | ICD-10-CM | POA: Insufficient documentation

## 2017-11-16 DIAGNOSIS — D472 Monoclonal gammopathy: Secondary | ICD-10-CM

## 2017-11-16 DIAGNOSIS — C880 Waldenstrom macroglobulinemia: Secondary | ICD-10-CM

## 2017-11-16 LAB — CBC WITH DIFFERENTIAL/PLATELET
Basophils Absolute: 0 10*3/uL (ref 0–0.1)
Basophils Relative: 1 %
Eosinophils Absolute: 0.2 10*3/uL (ref 0–0.7)
Eosinophils Relative: 3 %
HCT: 41.2 % (ref 40.0–52.0)
Hemoglobin: 14 g/dL (ref 13.0–18.0)
Lymphocytes Relative: 24 %
Lymphs Abs: 1.3 10*3/uL (ref 1.0–3.6)
MCH: 31.8 pg (ref 26.0–34.0)
MCHC: 34 g/dL (ref 32.0–36.0)
MCV: 93.6 fL (ref 80.0–100.0)
Monocytes Absolute: 0.4 10*3/uL (ref 0.2–1.0)
Monocytes Relative: 8 %
Neutro Abs: 3.7 10*3/uL (ref 1.4–6.5)
Neutrophils Relative %: 64 %
Platelets: 218 10*3/uL (ref 150–440)
RBC: 4.4 MIL/uL (ref 4.40–5.90)
RDW: 13.1 % (ref 11.5–14.5)
WBC: 5.6 10*3/uL (ref 3.8–10.6)

## 2017-11-16 LAB — COMPREHENSIVE METABOLIC PANEL
ALT: 10 U/L — ABNORMAL LOW (ref 17–63)
AST: 18 U/L (ref 15–41)
Albumin: 4.2 g/dL (ref 3.5–5.0)
Alkaline Phosphatase: 52 U/L (ref 38–126)
Anion gap: 8 (ref 5–15)
BUN: 31 mg/dL — ABNORMAL HIGH (ref 6–20)
CO2: 26 mmol/L (ref 22–32)
Calcium: 8.9 mg/dL (ref 8.9–10.3)
Chloride: 106 mmol/L (ref 101–111)
Creatinine, Ser: 1.21 mg/dL (ref 0.61–1.24)
GFR calc Af Amer: >60 mL/min (ref >60)
GFR calc non Af Amer: >60 mL/min (ref >60)
Glucose, Bld: 208 mg/dL — ABNORMAL HIGH (ref 65–99)
Potassium: 3.5 mmol/L (ref 3.5–5.1)
Sodium: 140 mmol/L (ref 135–145)
Total Bilirubin: 0.7 mg/dL (ref 0.3–1.2)
Total Protein: 7 g/dL (ref 6.5–8.1)

## 2017-11-16 MED ORDER — CYANOCOBALAMIN 1000 MCG/ML IJ SOLN
1000.0000 ug | Freq: Once | INTRAMUSCULAR | Status: AC
  Administered 2017-11-16: 1000 ug via INTRAMUSCULAR
  Filled 2017-11-16: qty 1

## 2017-11-16 NOTE — Progress Notes (Addendum)
[facility] day:  11/16/2017  Chief Complaint: Adrian Valdez is a 67 y.o. male with Waldenstrom's macroglobulinemia who is seen for 4 month assessment and continuation of monthly B12.  HPI:  The patient was last seen in the medical oncology clinic on 07/18/2017.  At that time, patient complained of chronic neuropathy in his feet.  Despite the cold weather, patient was wearing flip-flops due to his neuropathy symptoms.  Patient had discontinued the use of his glaucoma drops and metformin.  Memory remained poor on Aricept.  Exam was stable.  M spike 0.7%.  IgM elevated at 891 (20 - 1.72).  B12, folate, intrinsic factor antibodies, and antiparietal antibody all normal.  He received B12 injection.  Patient encouraged to follow-up with neurology regarding his memory and neuropathy.  He received B12 injections in 09/14/2017 and 10/12/2017. He missed his injection in December 2018.  Symptomatically, patient is doing well.  He has no acute problems to report today.  Patient notes that he is memory remains poor despite Aricept.  He states "I feel like my memory is declining because I am not using it but I have in the past".  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  He has not been seen in follow-up by neurology.   Patient denies bleeding; no hematochezia, melena, or gross hematuria.   He denies any B symptoms or interval infections.  Blood sugars have been elevated recently.  Of note patient advised at previous visit that he had been taken off of his Metformin.  Blood sugar 208 today patient states, "I know why that is. I have been eating a lot of Christmas candy".  Patient eating well.  His weight has increased by 2 pounds since his last clinic visit.  Patient denies pain today.   Past Medical History:  Diagnosis Date  . Ascending aorta enlargement ([place]) 08/18/2015  . B12 deficiency 07/27/2015  . Dementia   . Diabetes mellitus, type 2  ([place])   . Enlarged prostate   . Glaucoma   . Glaucoma   . Hypertension   . Neuropathy    feet and legs  . Urinary frequency   . Vitamin B 12 deficiency   . Waldenstrom's macroglobulinemia [facility])     Past Surgical History:  Procedure Laterality Date  . CATARACT EXTRACTION W/ INTRAOCULAR LENS IMPLANT    . COLONOSCOPY    . KNEE ARTHROSCOPY Right   . LASIK    . PHOTOCOAGULATION WITH LASER Right 05/24/2017   Procedure: PHOTOCOAGULATION WITH LASER;  Surgeon: Leandrew Koyanagi, MD;  Location: [facility];  Service: Ophthalmology;  Laterality: Right;  Diabetic - oral meds    Family History  Problem Relation Age of Onset  . Hypertension Mother   . Hypertension Father   . CAD Father   . Breast cancer Maternal Grandmother   . Ovarian cancer Paternal Grandmother   . Kidney disease Neg Hx   . Prostate cancer Neg Hx     Social History:  reports that  has never smoked. he has never used smokeless tobacco. He reports that he drinks alcohol. He reports that he does not use drugs.  He notes that he has 6 PhDs in fine arts, music, etc from the [place], [place], and [employer].  He lives with his parents.  The patient is accompanied by his father today.  Allergies:  Allergies  Allergen Reactions  . Oxybutynin Other (See Comments)  Headache  . Myrbetriq [Mirabegron] Other (See Comments)    Other reaction(s): Other (See Comments) migraine migraine     Current Medications: Current Outpatient Medications  Medication Sig Dispense Refill  . donepezil (ARICEPT) 5 MG tablet Take 5 mg by mouth at bedtime.     . finasteride (PROSCAR) 5 MG tablet Take 1 tablet (5 mg total) by mouth daily. Reported on 11/30/2015 90 tablet 3  . gabapentin (NEURONTIN) 300 MG capsule Take 300 mg by mouth 3 (three) times daily.    Marland Kitchen glimepiride (AMARYL) 2 MG tablet Take 2 mg by mouth 2 (two) times daily.    Marland Kitchen lisinopril (PRINIVIL,ZESTRIL) 10 MG tablet Take by mouth. Reported  on 11/30/2015    . methazolamide (NEPTAZANE) 50 MG tablet TK 1 T PO BID  1  . sertraline (ZOLOFT) 25 MG tablet Take 25 mg by mouth daily.    . tamsulosin (FLOMAX) 0.4 MG CAPS capsule TAKE ONE CAPSULE BY MOUTH ONCE DAILY, 30 MINUTES AFTER THE SAME MEAL EACH DAY    . timolol (BETIMOL) 0.5 % ophthalmic solution Apply to eye. Reported on 11/30/2015    . Travoprost, BAK Free, (TRAVATAN) 0.004 % SOLN ophthalmic solution 1 drop at bedtime.     No current facility-administered medications for this visit.     Review of Systems:  GENERAL:  Feels "ok".  No fevers or sweats.  Weight up 2 pounds. PERFORMANCE STATUS (ECOG):  2 HEENT: Glaucoma, off drops.  No runny nose, sore throat, mouth sores or tenderness. Lungs: No shortness of breath or cough.  No hemoptysis. Cardiac:  No chest pain, palpitations, orthopnea, or PND. GI:  No nausea, vomiting, diarrhea, constipation, melena or hematochezia. GU:  Enlarged prostate with chronic urgency, frequency, burning (no change).  No hematuria. Sees urology. Musculoskeletal:  No back pain.  Right knee/hip issues with upcoming plan to see orhtopedics.  No muscle tenderness. Extremities:  No pain or swelling. Skin:  No rashes or ulcers.  No bruises. Neuro:  Trouble thinking (no change).  Neuropathy in feet (see HPI).  No headache, numbness or weakness, balance or coordination issues. Endocrine:  Diabetes off Metformin.  No thyroid issues, hot flashes or night sweats. Psych:  Poor sleep.  ? Depression- seeing psychiatry.  No mood changes or anxiety. Pain:  No focal pain. Review of systems:  All other systems reviewed and found to be negative.  Physical Exam: BP 122/77 (BP Location: Left Arm, Patient Position: Sitting)   Pulse (!) 57   Temp (!) 97.2 F (36.2 C) (Tympanic)   Resp 18   Wt 212 lb 8 oz (96.4 kg)   BMI 31.38 kg/m  GENERAL:  Well developed, well nourished, gentleman sitting comfortably in the exam room. MENTAL STATUS:  Alert and oriented to person  and place. Unsure of year.  Knows it is November. HEAD:  Pearline Cables hair.  Male pattern baldness.  Normocephalic, atraumatic, face symmetric, no Cushingoid features. EYES:  Wearing dark glasses.  Mild medial right upper lid erythema.  Pupils equal round and reactive to light and accomodation.  No conjunctivitis or scleral icterus. ENT:  No oral lesions.  Tongue normal. Mucous membranes moist.  RESPIRATORY:  Clear to auscultation without rales, wheezes or rhonchi. CARDIOVASCULAR:  Regular rate and rhythm without murmur, rub or gallop. ABDOMEN:  Soft, non-tender, with active bowel sounds, and no hepatosplenomegaly.  No masses. SKIN:  Mild facial rash (dry).  No bruising or petechiae.  No ulcers or lesions. EXTREMITIES:  No edema, skin discoloration or tenderness.  No palpable cords. LYMPH NODES: No palpable cervical, supraclavicular, axillary or inguinal adenopathy  NEUROLOGICAL:  Stable. PSYCH:  Flat affect. Answers questions appropriately.   Appointment on 11/16/2017  Component Date Value Ref Range Status  . IgM (Immunoglobulin M), Srm 11/16/2017 571* 20 - 172 mg/dL Final   Comment: (NOTE) Performed At: [facility] [place], [place] [id number] Rush Farmer MD [id number] Performed at [facility], [address], [place], [place] [address]   . Total Protein ELP 11/16/2017 6.5  6.0 - 8.5 g/dL Final  . Albumin ELP 11/16/2017 3.9  2.9 - 4.4 g/dL Final  . Alpha-1-Globulin 11/16/2017 0.2  0.0 - 0.4 g/dL Final  . Alpha-2-Globulin 11/16/2017 0.7  0.4 - 1.0 g/dL Final  . Beta Globulin 11/16/2017 0.9  0.7 - 1.3 g/dL Final  . Gamma Globulin 11/16/2017 0.8  0.4 - 1.8 g/dL Final  . M-Spike, % 11/16/2017 0.4* Not Observed g/dL Final  . SPE Interp. 11/16/2017 Comment   Final   Comment: (NOTE) The SPE pattern demonstrates a single peak (M-spike) in the gamma region which may represent monoclonal protein. This peak may also be caused by circulating immune complexes,  cryoglobulins, C-reactive protein, fibrinogen or hemolysis.  If clinically indicated, the presence of a monoclonal gammopathy may be confirmed by immuno- fixation, as well as an evaluation of the urine for the presence of Bence-Jones protein. Performed At: [facility] [place], [place] [id number] Rush Farmer MD DX:[id number]   . Comment 11/16/2017 Comment   Final   Comment: (NOTE) Protein electrophoresis scan will follow via computer, mail, or courier delivery.   Marland Kitchen GLOBULIN, TOTAL 11/16/2017 2.6  2.2 - 3.9 g/dL Corrected  . A/G Ratio 11/16/2017 1.5  0.7 - 1.7 Corrected   Performed at [facility], [address], [place], [place] [address]  . Sodium 11/16/2017 140  135 - 145 mmol/L Final  . Potassium 11/16/2017 3.5  3.5 - 5.1 mmol/L Final  . Chloride 11/16/2017 106  101 - 111 mmol/L Final  . CO2 11/16/2017 26  22 - 32 mmol/L Final  . Glucose, Bld 11/16/2017 208* 65 - 99 mg/dL Final  . BUN 11/16/2017 31* 6 - 20 mg/dL Final  . Creatinine, Ser 11/16/2017 1.21  0.61 - 1.24 mg/dL Final  . Calcium 11/16/2017 8.9  8.9 - 10.3 mg/dL Final  . Total Protein 11/16/2017 7.0  6.5 - 8.1 g/dL Final  . Albumin 11/16/2017 4.2  3.5 - 5.0 g/dL Final  . AST 11/16/2017 18  15 - 41 U/L Final  . ALT 11/16/2017 10* 17 - 63 U/L Final  . Alkaline Phosphatase 11/16/2017 52  38 - 126 U/L Final  . Total Bilirubin 11/16/2017 0.7  0.3 - 1.2 mg/dL Final  . GFR calc non Af Amer 11/16/2017 >60  >60 mL/min Final  . GFR calc Af Amer 11/16/2017 >60  >60 mL/min Final   Comment: (NOTE) The eGFR has been calculated using the CKD EPI equation. This calculation has not been validated in all clinical situations. eGFR's persistently <60 mL/min signify possible Chronic Kidney Disease.   Georgiann Hahn gap 11/16/2017 8  5 - 15 Final   Performed at [facility], [address] [place], [place] [address]  . WBC 11/16/2017 5.6  3.8 - 10.6 K/uL Final  . RBC 11/16/2017 4.40  4.40 - 5.90  MIL/uL Final  . Hemoglobin 11/16/2017 14.0  13.0 - 18.0 g/dL Final  . HCT 11/16/2017 41.2  40.0 - 52.0 %  Final  . MCV 11/16/2017 93.6  80.0 - 100.0 fL Final  . MCH 11/16/2017 31.8  26.0 - 34.0 pg Final  . MCHC 11/16/2017 34.0  32.0 - 36.0 g/dL Final  . RDW 11/16/2017 13.1  11.5 - 14.5 % Final  . Platelets 11/16/2017 218  150 - 440 K/uL Final  . Neutrophils Relative % 11/16/2017 64  % Final  . Neutro Abs 11/16/2017 3.7  1.4 - 6.5 K/uL Final  . Lymphocytes Relative 11/16/2017 24  % Final  . Lymphs Abs 11/16/2017 1.3  1.0 - 3.6 K/uL Final  . Monocytes Relative 11/16/2017 8  % Final  . Monocytes Absolute 11/16/2017 0.4  0.2 - 1.0 K/uL Final  . Eosinophils Relative 11/16/2017 3  % Final  . Eosinophils Absolute 11/16/2017 0.2  0 - 0.7 K/uL Final  . Basophils Relative 11/16/2017 1  % Final  . Basophils Absolute 11/16/2017 0.0  0 - 0.1 K/uL Final   Performed at [facility], [address], [place], [place] [address]    Assessment:  Amardeep Beckers is a 67 y.o. 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  Work-up on 08/15/2016 revealed an IgM of 3808.  Serum viscosity was 2.5 (1.6-1.9).  Beta2-microglobulin was 2.5 (0.6-2.4).  Negative studies included:  hepatitis B surface antigen, hepatitis B core antibody, hepatitis C antibody, LDH, and CRP.   Bone marrow biopsy on 08/23/2016 revealed a mature B-cell lymphoma with plasmacytic differentiation, predominantly paratrabecular infiltrate of lymphoplasmacytic infiltrate accounts for 10-15% of marrow space involvement.  Marrow was normocellular for age (50-60%) with trilineage hematopoiesis.  There was mild to moderate paratrabecular increase in reticulin fibers associated with lymphoplasmacytic infiltrate.  [place] red stain was negative for amyloid.  Storage iron was present.    Bone marrow flow  cytometry revealed a monotypic B-cell population (0.6% of sample) with a non-specific immunophenotype.  There was abnormal/monotypic plasma cell population (< 0.1% of sample).  Cytogenetics were normal (46, XY).  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  Bone survey on 08/18/2016 revealed no evidence of bony metastatic disease.  There are no CRAB criteria (hypercalciumia, renal insufficiency, anemia or bone lesions).  Concern is raised for possible contribution of Waldenstrom's to decline in performance status (mentation).  Serum viscosity is mildly elevated.  He has no palpable adenopathy or hepatosplenomegaly.   SPEP has been monitored:  1.6 gm/dL on 07/09/2015, 2.4 on 07/24/2015, 2.8 on 08/11/2016, 2.0 on 10/31/2016, 1.8 on 11/21/2016, 1.1 on 01/02/2017, 0.7 on 01/31/2017, and 0.5 on 04/10/2017.  IgM has been followed:  3808 on 08/15/2016, 3531 on 10/25/2016, 2757 on 11/21/2016, 1740 on 01/02/2017, 1067 on 01/31/2017, and 911 on 04/10/2017.  Kappa free light chains have been monitored:  56.65 (ratio of 5.27) on 07/24/2015 and 70.3 (ratio 6.28) on 08/08/2016.  Chest, abdomen and pelvic CT scan on 08/12/2015 revealed no radiolucent bone lesions. There was no mediastinal or hilar adenopathy.  The ascending aorta measured 4.2 cm in diameter.  Chest, abdomen, and pelvic CT scan on 09/12/2016 revealed no adenopathy or hepatosplenomegaly.  There was mild fusiform enlargement of the ascending aorta (4.2 x 4.1 cm).  There were numerous hepatic cysts, colonic diverticulosis, an enlarged prostate with partial bladder outlet obstruction, and bilateral inguinal hernias.  Head MRI on 09/08/2016 revealed no acute or reversible findings.  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  There was mild  to moderate cerebral atrophy.  EEG on 09/27/2016 revealed right temporal epileptiform discharge and left temporal focal slowing.  Paraneoplastic panel was negative.  He has not had  an LP.  He has B12 deficiency on supplementation.  B12 was 154 on 07/24/2015 with an elevated MMA.  Folate was 31 and B12 was 246 (low normal) on 08/15/2016.  MMA was 226 (normal) on 08/30/2016.  He receives B12 monthly (last 06/12/2017).  He received 4 cycles of  Velcade, Decadron, and Rituxan (10/31/2016 - 01/02/2017).  He is tolerated treatment well.  Symptomatically, his memory remains poor.  He has a neuropathy in his feet on neurontin.  Exam reveals no adenopathy or hepatosplenomegaly.  CBC and chemistries are normal.    Plan: 1.  Labs today:  CBC with diff, SPEP, IgM 2.  Discuss M-spike and IgM level. Results pending. Will call patient with results. If remain stable, we will discussed continued observation. 3.  Encouraged patient to follow up with neurology regarding his memory issues and neuropathy. Patient noting that he has "issues" with his provider and that he can be removed from his care team. Discussed seeking an alternate provider.  4.  B12 today and monthly x 5. 5.  RTC in 6 months for MD assessment, labs (CBC with diff, SPEP, IgM), and B12.   Honor Loh, NP  11/16/2017, 7:31 PM

## 2017-11-17 ENCOUNTER — Telehealth: Payer: Self-pay | Admitting: *Deleted

## 2017-11-17 LAB — PROTEIN ELECTROPHORESIS, SERUM
A/G Ratio: 1.5 (ref 0.7–1.7)
Albumin ELP: 3.9 g/dL (ref 2.9–4.4)
Alpha-1-Globulin: 0.2 g/dL (ref 0.0–0.4)
Alpha-2-Globulin: 0.7 g/dL (ref 0.4–1.0)
Beta Globulin: 0.9 g/dL (ref 0.7–1.3)
Gamma Globulin: 0.8 g/dL (ref 0.4–1.8)
Globulin, Total: 2.6 g/dL (ref 2.2–3.9)
M-Spike, %: 0.4 g/dL — ABNORMAL HIGH
Total Protein ELP: 6.5 g/dL (ref 6.0–8.5)

## 2017-11-17 LAB — IGM: IgM (Immunoglobulin M), Srm: 571 mg/dL — ABNORMAL HIGH (ref 20–172)

## 2017-11-17 NOTE — Telephone Encounter (Signed)
Called patient's father to inform him of patient's labs.  IgM down from 891 to 597 and M-spike down from .7 to .4.

## 2017-12-18 ENCOUNTER — Inpatient Hospital Stay: Payer: BLUE CROSS/BLUE SHIELD | Attending: Hematology and Oncology

## 2017-12-18 DIAGNOSIS — E538 Deficiency of other specified B group vitamins: Secondary | ICD-10-CM | POA: Diagnosis not present

## 2017-12-18 MED ORDER — CYANOCOBALAMIN 1000 MCG/ML IJ SOLN
1000.0000 ug | Freq: Once | INTRAMUSCULAR | Status: AC
  Administered 2017-12-18: 1000 ug via INTRAMUSCULAR

## 2018-01-22 ENCOUNTER — Inpatient Hospital Stay: Payer: BLUE CROSS/BLUE SHIELD | Attending: Hematology and Oncology

## 2018-01-22 DIAGNOSIS — E538 Deficiency of other specified B group vitamins: Secondary | ICD-10-CM | POA: Insufficient documentation

## 2018-01-22 MED ORDER — CYANOCOBALAMIN 1000 MCG/ML IJ SOLN
1000.0000 ug | Freq: Once | INTRAMUSCULAR | Status: AC
  Administered 2018-01-22: 1000 ug via INTRAMUSCULAR

## 2018-02-15 ENCOUNTER — Ambulatory Visit (INDEPENDENT_AMBULATORY_CARE_PROVIDER_SITE_OTHER): Payer: BLUE CROSS/BLUE SHIELD | Admitting: Urology

## 2018-02-15 ENCOUNTER — Encounter: Payer: Self-pay | Admitting: Urology

## 2018-02-15 VITALS — BP 144/89 | HR 66 | Ht 69.0 in | Wt 214.0 lb

## 2018-02-15 DIAGNOSIS — N401 Enlarged prostate with lower urinary tract symptoms: Secondary | ICD-10-CM

## 2018-02-15 DIAGNOSIS — R350 Frequency of micturition: Secondary | ICD-10-CM

## 2018-02-15 DIAGNOSIS — R3912 Poor urinary stream: Secondary | ICD-10-CM

## 2018-02-15 DIAGNOSIS — N138 Other obstructive and reflux uropathy: Secondary | ICD-10-CM

## 2018-02-15 LAB — URINALYSIS, COMPLETE
Bilirubin, UA: NEGATIVE
GLUCOSE, UA: NEGATIVE
Ketones, UA: NEGATIVE
Leukocytes, UA: NEGATIVE
NITRITE UA: NEGATIVE
PH UA: 5 (ref 5.0–7.5)
Specific Gravity, UA: >1.03 — ABNORMAL HIGH (ref 1.005–1.030)
Urobilinogen, Ur: 0.2 mg/dL (ref 0.2–1.0)

## 2018-02-15 LAB — MICROSCOPIC EXAMINATION
EPITHELIAL CELLS (NON RENAL): NONE SEEN /HPF (ref 0–10)
RBC MICROSCOPIC, UA: NONE SEEN /HPF (ref 0–2)
WBC UA: NONE SEEN /HPF (ref 0–5)

## 2018-02-15 LAB — BLADDER SCAN AMB NON-IMAGING: Scan Result: 25

## 2018-02-15 NOTE — Progress Notes (Signed)
02/15/2018 11:24 AM   Adrian Valdez 19-May-1951 [id number]  Referring provider: Kirk Ruths, MD [facility] [place], [place] [address]  No chief complaint on file.   HPI: F/u --   1 - Very Large Prostate with Lower Urinary Tract Symptoms - long h/o obsructive and irritative voiding partially controlled on tamsulosin + finasteride. Hospitalized with hallucination with trial of anticholinergic. Prostate vol 177mL by CT 2016 with medain lobe. PVR 2017 " 24 mL". Previously counseled towards simple prostatetomy / HoLEP. Cysto 08/2016 with very large trilobar hypertrophy only, no strictures / masses. Currently continuing dual medical therapy with consideration of HoLEP should bother progress substantially.   2 - Prostate Screening -  2016 - PSA 3.6 (variable finasteride compliance), Vol 167ml, PSAD 0.026 (very low)  3 - Non-Complex Right Renal Cyst - 3cm noncomplex right mid renal cyst on abd CT 2016. No coarse calcifications, enhancing nodules, or mass effect. No f/u needed.   PMH sig for major depression, monoclonal antibody spike (follows [facility], on chemo). 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 He is on chemo for Waldenstrom's.   He returns for yearly exam and PSA.  He's not sure what medicine he takes. His PVR is 25 ml. He continues to have variable weak stream, frequency and urgency. He feels like he empties the bladder. No gross hematuria.    PMH: Past Medical History:  Diagnosis Date  . 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 B12 deficiency 07/27/2015  . Dementia   . Diabetes mellitus, type 2 ([place])   . Enlarged prostate   . Glaucoma   . Glaucoma   . Hypertension   . Neuropathy    feet and legs  . Urinary frequency   . Vitamin B 12 deficiency   . Waldenstrom's macroglobulinemia [facility])     Surgical History: Past Surgical History:  Procedure Laterality Date  . CATARACT  EXTRACTION W/ INTRAOCULAR LENS IMPLANT    . COLONOSCOPY    . KNEE ARTHROSCOPY Right   . LASIK    . PHOTOCOAGULATION WITH LASER Right 05/24/2017   Procedure: PHOTOCOAGULATION WITH LASER;  Surgeon: Leandrew Koyanagi, MD;  Location: [facility];  Service: Ophthalmology;  Laterality: Right;  Diabetic - oral meds    Home Medications:  Allergies as of 02/15/2018      Reactions   Oxybutynin Other (See Comments)   Headache   Myrbetriq [mirabegron] Other (See Comments)   Other reaction(s): Other (See Comments) migraine migraine      Medication List        Accurate as of 02/15/18 11:24 AM. Always use your most recent med list.          donepezil 5 MG tablet Commonly known as:  ARICEPT Take 5 mg by mouth at bedtime.   finasteride 5 MG tablet Commonly known as:  PROSCAR Take 1 tablet (5 mg total) by mouth daily. Reported on 11/30/2015   gabapentin 300 MG capsule Commonly known as:  NEURONTIN Take 300 mg by mouth 3 (three) times daily.   glimepiride 2 MG tablet Commonly known as:  AMARYL Take 2 mg by mouth 2 (two) times daily.   lisinopril 10 MG tablet Commonly known as:  PRINIVIL,ZESTRIL Take by mouth. Reported on 11/30/2015   methazolamide 50 MG tablet Commonly known as:  NEPTAZANE TK 1 T PO BID   sertraline 25 MG tablet Commonly known as:  ZOLOFT Take  25 mg by mouth daily.   tamsulosin 0.4 MG Caps capsule Commonly known as:  FLOMAX TAKE ONE CAPSULE BY MOUTH ONCE DAILY, 30 MINUTES AFTER THE SAME MEAL EACH DAY   timolol 0.5 % ophthalmic solution Commonly known as:  BETIMOL Apply to eye. Reported on 11/30/2015   Travoprost (BAK Free) 0.004 % Soln ophthalmic solution Commonly known as:  TRAVATAN 1 drop at bedtime.       Allergies:  Allergies  Allergen Reactions  . Oxybutynin Other (See Comments)    Headache  . Myrbetriq [Mirabegron] Other (See Comments)    Other reaction(s): Other (See Comments) migraine migraine     Family History: Family  History  Problem Relation Age of Onset  . Hypertension Mother   . Hypertension Father   . CAD Father   . Breast cancer Maternal Grandmother   . Ovarian cancer Paternal Grandmother   . Kidney disease Neg Hx   . Prostate cancer Neg Hx     Social History:  reports that he has never smoked. He has never used smokeless tobacco. He reports that he drinks alcohol. He reports that he does not use drugs.  ROS:                                        Physical Exam: BP (!) 144/89 (BP Location: Right Arm, Patient Position: Sitting, Cuff Size: Normal)   Pulse 66   Ht 5\' 9"  (1.753 m)   Wt 97.1 kg (214 lb)   BMI 31.60 kg/m   Constitutional:  Alert and oriented, No acute distress. HEENT: [place] AT, moist mucus membranes.  Trachea midline, no masses. Cardiovascular: No clubbing, cyanosis, or edema. Respiratory: Normal respiratory effort, no increased work of breathing. GI: Abdomen is soft, nontender, nondistended, no abdominal masses GU: No CVA tenderness DRE: 75 grams, smooth, no hard area or nodule.  Lymph: No cervical or inguinal lymphadenopathy. Skin: No rashes, bruises or suspicious lesions. Neurologic: Grossly intact, no focal deficits, moving all 4 extremities. Psychiatric: Normal mood and affect.  Laboratory Data: Lab Results  Component Value Date   WBC 5.6 11/16/2017   HGB 14.0 11/16/2017   HCT 41.2 11/16/2017   MCV 93.6 11/16/2017   PLT 218 11/16/2017    Lab Results  Component Value Date   CREATININE 1.21 11/16/2017    No results found for: PSA  No results found for: TESTOSTERONE  No results found for: HGBA1C  Urinalysis    Component Value Date/Time   COLORURINE YELLOW (A) 01/02/2017 1530   APPEARANCEUR Clear 03/28/2017 1120   LABSPEC 1.016 01/02/2017 1530   PHURINE 6.0 01/02/2017 1530   GLUCOSEU Negative 03/28/2017 1120   HGBUR NEGATIVE 01/02/2017 1530   BILIRUBINUR Negative 03/28/2017 1120   KETONESUR NEGATIVE 01/02/2017 1530    PROTEINUR Trace (A) 03/28/2017 1120   PROTEINUR NEGATIVE 01/02/2017 1530   NITRITE Negative 03/28/2017 1120   NITRITE NEGATIVE 01/02/2017 1530   LEUKOCYTESUR Negative 03/28/2017 1120    Lab Results  Component Value Date   LABMICR See below: 03/28/2017   WBCUA None seen 03/28/2017   RBCUA None seen 03/28/2017   LABEPIT None seen 03/28/2017   MUCUS Present (A) 03/28/2017   BACTERIA None seen 03/28/2017    No results found for this or any previous visit. No results found for this or any previous visit. No results found for this or any previous visit. No results found for  this or any previous visit. No results found for this or any previous visit. No results found for this or any previous visit. No results found for this or any previous visit. No results found for this or any previous visit.  Assessment & Plan:    1. Enlarged prostate with urinary obstruction He remains stable. I wrote down his meds and recommended he continue taking tamsulosin and finasteride and the nature r/b of each. PSA was sent.  - Urinalysis, Complete - Bladder Scan (Post Void Residual) in office   No follow-ups on file.  Festus Aloe, MD  [facility] [address] [place], [place] [address] [phone]

## 2018-02-16 ENCOUNTER — Telehealth: Payer: Self-pay

## 2018-02-16 LAB — PSA: PROSTATE SPECIFIC AG, SERUM: 2.6 ng/mL (ref 0.0–4.0)

## 2018-02-16 NOTE — Telephone Encounter (Signed)
-----   Message from Festus Aloe, MD sent at 02/16/2018  1:20 PM EDT ----- Notify patient -- PSA stable, lower. Give result.   ----- Message ----- From: Garnette Gunner, CMA Sent: 02/16/2018   9:21 AM To: Festus Aloe, MD    ----- Message ----- From: Interface, [facility] Lab Results In Sent: 02/15/2018   1:38 PM To: Row[facility] Clinical

## 2018-02-19 ENCOUNTER — Inpatient Hospital Stay: Payer: BLUE CROSS/BLUE SHIELD | Attending: Hematology and Oncology

## 2018-02-19 DIAGNOSIS — E538 Deficiency of other specified B group vitamins: Secondary | ICD-10-CM | POA: Diagnosis present

## 2018-02-19 MED ORDER — CYANOCOBALAMIN 1000 MCG/ML IJ SOLN
1000.0000 ug | Freq: Once | INTRAMUSCULAR | Status: AC
  Administered 2018-02-19: 1000 ug via INTRAMUSCULAR

## 2018-02-21 NOTE — Telephone Encounter (Signed)
Pt informed

## 2018-03-19 ENCOUNTER — Inpatient Hospital Stay: Payer: BLUE CROSS/BLUE SHIELD | Attending: Hematology and Oncology

## 2018-03-19 DIAGNOSIS — E538 Deficiency of other specified B group vitamins: Secondary | ICD-10-CM | POA: Insufficient documentation

## 2018-03-19 MED ORDER — CYANOCOBALAMIN 1000 MCG/ML IJ SOLN
1000.0000 ug | Freq: Once | INTRAMUSCULAR | Status: AC
  Administered 2018-03-19: 1000 ug via INTRAMUSCULAR

## 2018-04-23 ENCOUNTER — Inpatient Hospital Stay: Payer: BLUE CROSS/BLUE SHIELD | Attending: Hematology and Oncology

## 2018-04-23 DIAGNOSIS — E538 Deficiency of other specified B group vitamins: Secondary | ICD-10-CM | POA: Insufficient documentation

## 2018-04-23 MED ORDER — CYANOCOBALAMIN 1000 MCG/ML IJ SOLN
1000.0000 ug | Freq: Once | INTRAMUSCULAR | Status: AC
  Administered 2018-04-23: 1000 ug via INTRAMUSCULAR

## 2018-05-20 NOTE — Progress Notes (Signed)
[facility] day:  05/21/2018  Chief Complaint: Adrian Valdez is a 67 y.o. male with Waldenstrom's macroglobulinemia who is seen for 6 month assessment and continuation of monthly B12.  HPI:  The patient was last seen in the medical oncology clinic on 11/16/2017.  At that time, patient doing well overall. No acute symptoms. Memory remained poor despite daily Aricept.Peripheral neuropathy persisted. Blood sugars elevated off Metformin. Eating well; weight up 2 pounds. Exam stable. IgM 571. M-spike 0.4 (previously 0.7 g/dL).   Patient has continued on monthly parenteral B12 supplementation. Last injection was received on 04/23/2018.   He was seen in follow up consult on 12/07/2017 by Rufina Falco, NP (neurology). Notes reviewed. Patient with progressive dementia, neuropathy, and depression. Donepezil 10 mg daily and gabapentin 300 mg TID continued. Patient started on sertraline 50 mg for depression. Will follow up in 3 months.   He was seen in follow up consult on 03/09/2018 by Rufina Falco, NP (neurology). Notes reviewed. Dementia continued to progress. Questioned ability to remain in current living situation with elderly parent. Discussed ALF, however patient became frustrated and wanted to leave the room. Started on memantine 5 mg/day in addition to the previously prescribed donepezil. Discussed referral to psychiatry, however patient refused. Will follow up in 6 months with Dr. Manuella Ghazi.   Patient has been seen on several occassions by PCP Ouida Sills, MD). Notes reviewed. Patient has been seen by [facility] for depression. He was sent back to PCP with concerns for safety and ability to care for himself. Patient with poor hygiene and labile moods. PCP agrees that patient should not be living alone at this point. Home health nursing and social work was ordered.   In the interim, patient has been doing well overall. His cognition continues to decline. He has issues  with both short term and long term memory. Patient thinks that it is "01-23-1951". Short term recall of 3 words is 0/3. Patient unable to identify the President of the [place]. He states, "No one really cares either". Unable to count backwards from 100 in increments of 3. Patient able to identify that there are 5 nickels in a quarter, but unable to advise how many quarters are in a dollar. Patient presents today with a disheveled appearance. He is wearing a pair of glasses on his face, and has another clipped to his shirt. He lives alone at this point. Plans are for brother to see a lawyer on 05/22/2018 in order to get HCPOA. Family helps with ADLs. Family concerned about safety. Brother also has an appointment with [facility] on 05/22/2018.    Patient denies that he has experienced any B symptoms. He denies any interval infections. He denies weakness and issues with his balance.  Patient advises that he maintains an adequate appetite. He is eating well. Weight today is 217 lb 6.4 oz (98.6 kg), which compared to his last visit to the clinic, represents a 5 pound increase.    Patient denies pain in the clinic today.   Past Medical History:  Diagnosis Date  . 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 B12 deficiency 07/27/2015  . Dementia   . Diabetes mellitus, type 2 ([place])   . Enlarged prostate   . Glaucoma   . Glaucoma   . Hypertension   . Neuropathy    feet and legs  . Urinary frequency   . Vitamin B 12 deficiency   . Waldenstrom's macroglobulinemia ([place])  Past Surgical History:  Procedure Laterality Date  . CATARACT EXTRACTION W/ INTRAOCULAR LENS IMPLANT    . COLONOSCOPY    . KNEE ARTHROSCOPY Right   . LASIK    . PHOTOCOAGULATION WITH LASER Right 05/24/2017   Procedure: PHOTOCOAGULATION WITH LASER;  Surgeon: Leandrew Koyanagi, MD;  Location: [facility];  Service: Ophthalmology;  Laterality: Right;  Diabetic - oral meds    Family History   Problem Relation Age of Onset  . Hypertension Mother   . Hypertension Father   . CAD Father   . Breast cancer Maternal Grandmother   . Ovarian cancer Paternal Grandmother   . Kidney disease Neg Hx   . Prostate cancer Neg Hx     Social History:  reports that he has never smoked. He has never used smokeless tobacco. He reports that he drinks alcohol. He reports that he does not use drugs.  He notes that he has 6 PhDs in fine arts, music, etc from the [employer] and [employer].  He lives with his parents.  The patient is accompanied by his father today.  Allergies:  Allergies  Allergen Reactions  . Oxybutynin Other (See Comments)    Headache  . Myrbetriq [Mirabegron] Other (See Comments)    Other reaction(s): Other (See Comments) migraine migraine     Current Medications: Current Outpatient Medications  Medication Sig Dispense Refill  . finasteride (PROSCAR) 5 MG tablet Take 1 tablet (5 mg total) by mouth daily. Reported on 11/30/2015 90 tablet 3  . gabapentin (NEURONTIN) 300 MG capsule Take 300 mg by mouth 3 (three) times daily.    Marland Kitchen lisinopril (PRINIVIL,ZESTRIL) 10 MG tablet Take by mouth. Reported on 11/30/2015    . memantine (NAMENDA) 5 MG tablet TK 1 T PO ONCE D  3  . methazolamide (NEPTAZANE) 50 MG tablet TK 1 T PO BID  1  . sertraline (ZOLOFT) 25 MG tablet Take 25 mg by mouth daily.    . tamsulosin (FLOMAX) 0.4 MG CAPS capsule TAKE ONE CAPSULE BY MOUTH ONCE DAILY, 30 MINUTES AFTER THE SAME MEAL EACH DAY    . timolol (BETIMOL) 0.5 % ophthalmic solution Apply to eye. Reported on 11/30/2015    . Travoprost, BAK Free, (TRAVATAN) 0.004 % SOLN ophthalmic solution 1 drop at bedtime.    . donepezil (ARICEPT) 5 MG tablet Take 5 mg by mouth at bedtime.     . fluticasone (FLONASE) 50 MCG/ACT nasal spray Place into the nose.    Marland Kitchen glimepiride (AMARYL) 2 MG tablet Take 2 mg by mouth 2 (two) times daily.     No current facility-administered medications  for this visit.     Review of Systems  Constitutional: Negative for chills, diaphoresis, fever, malaise/fatigue and weight loss (up 5 pounds).  HENT: Negative.  Negative for congestion, ear discharge, ear pain, hearing loss, nosebleeds, sinus pain, sore throat and tinnitus.   Eyes: Positive for discharge. Negative for blurred vision, double vision, photophobia, pain and redness.       Glaucoma  Respiratory: Negative.  Negative for cough, hemoptysis, sputum production and shortness of breath.   Cardiovascular: Negative.  Negative for chest pain, palpitations, orthopnea, leg swelling and PND.  Gastrointestinal: Negative for abdominal pain, blood in stool, constipation, diarrhea, heartburn, melena, nausea and vomiting.  Genitourinary:       Significant BPH  Musculoskeletal: Positive for joint pain (RIGHT knee and hip). Negative for falls and myalgias.  Skin: Negative.  Negative for itching and rash.  Neurological: Positive for sensory change (neuropathy in feet - on gabapentin ). Negative for dizziness, tingling, tremors, speech change, focal weakness, weakness and headaches.  Endo/Heme/Allergies: Does not bruise/bleed easily.       Diabetes  Psychiatric/Behavioral: Positive for depression and memory loss (severe - on donepezil and memantine). Negative for suicidal ideas. The patient is not nervous/anxious and does not have insomnia.   All other systems reviewed and are negative.  Performance status (ECOG): 2 - Symptomatic, <50% confined to bed  Vital signs: BP (!) 150/101 (BP Location: Left Arm, Patient Position: Sitting)   Pulse 77   Temp 98.6 F (37 C) (Tympanic)   Resp 18   Wt 217 lb 6.4 oz (98.6 kg)   BMI 32.10 kg/m   Physical Exam  Constitutional:  Well developed well nourished, but disheveled gentleman roaming around room in no acute distress.  HENT:  Head: Normocephalic and atraumatic.  Gray hair.  Eyes: Pupils are equal, round, and reactive to light. EOM are normal. No  scleral icterus.  Dark glasses.  Neck: Normal range of motion. Neck supple. No tracheal deviation present. No thyromegaly present.  Cardiovascular: Normal rate, regular rhythm and normal heart sounds. Exam reveals no gallop and no friction rub.  No murmur heard. Pulmonary/Chest: Effort normal and breath sounds normal. No respiratory distress. He has no wheezes. He has no rales.  Abdominal: Soft. Bowel sounds are normal. He exhibits no distension. There is no tenderness.  Musculoskeletal: Normal range of motion. He exhibits no edema or tenderness.  Lymphadenopathy:    He has no cervical adenopathy.    He has no axillary adenopathy.       Right: No inguinal and no supraclavicular adenopathy present.       Left: No inguinal and no supraclavicular adenopathy present.  Neurological: He is alert. He is disoriented.  Skin: Skin is warm and dry. No rash noted. No erythema.  Psychiatric: He exhibits a depressed mood. He expresses no homicidal and no suicidal ideation. He exhibits disordered thought content, abnormal new learning ability, abnormal recent memory and abnormal remote memory. He has a flat affect.  Nursing note and vitals reviewed.   Appointment on 05/21/2018  Component Date Value Ref Range Status  . Sodium 05/21/2018 137  135 - 145 mmol/L Final  . Potassium 05/21/2018 4.0  3.5 - 5.1 mmol/L Final  . Chloride 05/21/2018 104  98 - 111 mmol/L Final  . CO2 05/21/2018 25  22 - 32 mmol/L Final  . Glucose, Bld 05/21/2018 192* 70 - 99 mg/dL Final  . BUN 05/21/2018 15  8 - 23 mg/dL Final  . Creatinine, Ser 05/21/2018 1.11  0.61 - 1.24 mg/dL Final  . Calcium 05/21/2018 9.1  8.9 - 10.3 mg/dL Final  . Total Protein 05/21/2018 6.9  6.5 - 8.1 g/dL Final  . Albumin 05/21/2018 4.4  3.5 - 5.0 g/dL Final  . AST 05/21/2018 16  15 - 41 U/L Final  . ALT 05/21/2018 10  0 - 44 U/L Final  . Alkaline Phosphatase 05/21/2018 50  38 - 126 U/L Final  . Total Bilirubin 05/21/2018 0.7  0.3 - 1.2 mg/dL Final   . GFR calc non Af Amer 05/21/2018 >60  >60 mL/min Final  . GFR calc Af Amer 05/21/2018 >60  >60 mL/min Final   Comment: (NOTE) The eGFR has been calculated using the CKD EPI equation. This calculation has not been validated in all clinical situations. eGFR's persistently <60 mL/min signify possible Chronic Kidney Disease.   Marland Kitchen  Anion gap 05/21/2018 8  5 - 15 Final   Performed at [facility], [address] [place], [place] [address]  . WBC 05/21/2018 5.6  3.8 - 10.6 K/uL Final  . RBC 05/21/2018 4.77  4.40 - 5.90 MIL/uL Final  . Hemoglobin 05/21/2018 14.7  13.0 - 18.0 g/dL Final  . HCT 05/21/2018 43.7  40.0 - 52.0 % Final  . MCV 05/21/2018 91.6  80.0 - 100.0 fL Final  . MCH 05/21/2018 30.8  26.0 - 34.0 pg Final  . MCHC 05/21/2018 33.7  32.0 - 36.0 g/dL Final  . RDW 05/21/2018 13.3  11.5 - 14.5 % Final  . Platelets 05/21/2018 226  150 - 440 K/uL Final  . Neutrophils Relative % 05/21/2018 69  % Final  . Neutro Abs 05/21/2018 3.8  1.4 - 6.5 K/uL Final  . Lymphocytes Relative 05/21/2018 21  % Final  . Lymphs Abs 05/21/2018 1.2  1.0 - 3.6 K/uL Final  . Monocytes Relative 05/21/2018 6  % Final  . Monocytes Absolute 05/21/2018 0.4  0.2 - 1.0 K/uL Final  . Eosinophils Relative 05/21/2018 3  % Final  . Eosinophils Absolute 05/21/2018 0.1  0 - 0.7 K/uL Final  . Basophils Relative 05/21/2018 1  % Final  . Basophils Absolute 05/21/2018 0.1  0 - 0.1 K/uL Final   Performed at [facility], [address], [place], [place] [address]    Assessment:  Adrian Valdez is a 67 y.o. 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  Work-up on 08/15/2016 revealed an IgM of 3808.  Serum viscosity was 2.5 (1.6-1.9).  Beta2-microglobulin was 2.5 (0.6-2.4).  Negative studies included:  hepatitis B surface antigen, hepatitis B core antibody, hepatitis C  antibody, LDH, and CRP.   Bone marrow biopsy on 08/23/2016 revealed a mature B-cell lymphoma with plasmacytic differentiation, predominantly paratrabecular infiltrate of lymphoplasmacytic infiltrate accounts for 10-15% of marrow space involvement.  Marrow was normocellular for age (50-60%) with trilineage hematopoiesis.  There was mild to moderate paratrabecular increase in reticulin fibers associated with lymphoplasmacytic infiltrate.  [place] red stain was negative for amyloid.  Storage iron was present.    Bone marrow flow cytometry revealed a monotypic B-cell population (0.6% of sample) with a non-specific immunophenotype.  There was abnormal/monotypic plasma cell population (< 0.1% of sample).  Cytogenetics were normal (46, XY).  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  Bone survey on 08/18/2016 revealed no evidence of bony metastatic disease.  There are no CRAB criteria (hypercalciumia, renal insufficiency, anemia or bone lesions).  Concern is raised for possible contribution of Waldenstrom's to decline in performance status (mentation).  Serum viscosity is mildly elevated.  He has no palpable adenopathy or hepatosplenomegaly.   SPEP has been monitored:  1.6 gm/dL on 07/09/2015, 2.4 on 07/24/2015, 2.8 on 08/11/2016, 2.0 on 10/31/2016, 1.8 on 11/21/2016, 1.1 on 01/02/2017, 0.7 on 01/31/2017, 0.5 on 04/10/2017, 0.7 on 07/18/2017, 0.4 on 11/16/2017, and 0.4 on 05/21/2018.    IgM has been followed:  3808 on 08/15/2016, 3531 on 10/25/2016, 2757 on 11/21/2016, 1740 on 01/02/2017, 1067 on 01/31/2017, 911 on 04/10/2017, 891 on 07/18/2017, 571 on 11/16/2017, and 737 on 05/21/2018.  Kappa free light chains have been monitored:  56.65 (ratio of 5.27) on 07/24/2015 and 70.3 (ratio 6.28) on 08/08/2016.  Chest, abdomen and pelvic CT scan on 08/12/2015 revealed no radiolucent bone lesions. There was no  mediastinal or hilar adenopathy.  The ascending aorta measured 4.2 cm in  diameter.  Chest, abdomen, and pelvic CT scan on 09/12/2016 revealed no adenopathy or hepatosplenomegaly.  There was mild fusiform enlargement of the ascending aorta (4.2 x 4.1 cm).  There were numerous hepatic cysts, colonic diverticulosis, an enlarged prostate with partial bladder outlet obstruction, and bilateral inguinal hernias.  Head MRI on 09/08/2016 revealed no acute or reversible findings.  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  There was mild to moderate cerebral atrophy.  EEG on 09/27/2016 revealed right temporal epileptiform discharge and left temporal focal slowing.  Paraneoplastic panel was negative.  He has not had an LP.  He has B12 deficiency on supplementation.  B12 was 154 on 07/24/2015 with an elevated MMA.  Folate was 31 and B12 was 246 (low normal) on 08/15/2016.  MMA was 226 (normal) on 08/30/2016.  He receives B12 monthly (last 04/23/2018).  He received 4 cycles of  Velcade, Decadron, and Rituxan (10/31/2016 - 01/02/2017).  He is tolerated treatment well.  Symptomatically, patient is progressive confused.  His memory continues to deteriorate despite donepezil and memantine. He has a neuropathy in his feet on Neurontin.  Exam reveals no adenopathy or hepatosplenomegaly.  CBC and chemistries are normal.    Plan: 1. Labs today: CBC with diff, CMP, SPEP, IgM, folate. 2. Waldenstrom's macroglobulinemia  Stable. IgM and SPEP pending. Continue observation. 3. B12 deficiency  Continues on monthly parenteral supplementation.  Check folate level today.  B12 injection today, then monthly x 5. 4. Neuropathy  Stable on gabapentin 300 mg TID.   Followed by neurology. Next appointment in 09/2018.  5. Dementia  Progressive despite donepezil 10 mg and memantine 5 mg daily.   Both short and long term memory impairment.   Needs higher level of care. PCP and specialty care providers all agree that patient needs higher level of care (ALF vs. SNF).   6. Depression  Has been evaluated by [facility]. Concerns for safety and lack of personal hygiene.  No overt concerns for suicidality in clinic today.     Continues on sertraline 50 mg daily. 7. RTC in 6 months for MD assessment, labs (CBC with diff, CMP, SPEP, IgM), and B12   Honor Loh, NP  05/21/2018, 11:47 AM  I saw and evaluated the patient, participating in the key portions of the service and reviewing pertinent diagnostic studies and records.  I reviewed the nurse practitioner's note and agree with the findings and the plan.  The assessment and plan were discussed with the patient.  Several questions were asked by the patient's father  and answered.   Nolon Stalls, MD 05/21/2018,11:47 AM

## 2018-05-21 ENCOUNTER — Other Ambulatory Visit: Payer: Self-pay

## 2018-05-21 ENCOUNTER — Telehealth: Payer: Self-pay | Admitting: *Deleted

## 2018-05-21 ENCOUNTER — Other Ambulatory Visit: Payer: Self-pay | Admitting: Hematology and Oncology

## 2018-05-21 ENCOUNTER — Inpatient Hospital Stay (HOSPITAL_BASED_OUTPATIENT_CLINIC_OR_DEPARTMENT_OTHER): Payer: BLUE CROSS/BLUE SHIELD | Admitting: Hematology and Oncology

## 2018-05-21 ENCOUNTER — Inpatient Hospital Stay: Payer: BLUE CROSS/BLUE SHIELD | Attending: Hematology and Oncology

## 2018-05-21 ENCOUNTER — Inpatient Hospital Stay: Payer: BLUE CROSS/BLUE SHIELD

## 2018-05-21 ENCOUNTER — Encounter: Payer: Self-pay | Admitting: Hematology and Oncology

## 2018-05-21 VITALS — BP 150/101 | HR 77 | Temp 98.6°F | Resp 18 | Wt 217.4 lb

## 2018-05-21 DIAGNOSIS — E538 Deficiency of other specified B group vitamins: Secondary | ICD-10-CM

## 2018-05-21 DIAGNOSIS — F419 Anxiety disorder, unspecified: Secondary | ICD-10-CM

## 2018-05-21 DIAGNOSIS — F0390 Unspecified dementia without behavioral disturbance: Secondary | ICD-10-CM

## 2018-05-21 DIAGNOSIS — D472 Monoclonal gammopathy: Secondary | ICD-10-CM

## 2018-05-21 DIAGNOSIS — G629 Polyneuropathy, unspecified: Secondary | ICD-10-CM

## 2018-05-21 DIAGNOSIS — C880 Waldenstrom macroglobulinemia: Secondary | ICD-10-CM

## 2018-05-21 DIAGNOSIS — I10 Essential (primary) hypertension: Secondary | ICD-10-CM

## 2018-05-21 LAB — CBC WITH DIFFERENTIAL/PLATELET
BASOS ABS: 0.1 10*3/uL (ref 0–0.1)
BASOS PCT: 1 %
EOS ABS: 0.1 10*3/uL (ref 0–0.7)
Eosinophils Relative: 3 %
HCT: 43.7 % (ref 40.0–52.0)
Hemoglobin: 14.7 g/dL (ref 13.0–18.0)
Lymphocytes Relative: 21 %
Lymphs Abs: 1.2 10*3/uL (ref 1.0–3.6)
MCH: 30.8 pg (ref 26.0–34.0)
MCHC: 33.7 g/dL (ref 32.0–36.0)
MCV: 91.6 fL (ref 80.0–100.0)
MONO ABS: 0.4 10*3/uL (ref 0.2–1.0)
Monocytes Relative: 6 %
Neutro Abs: 3.8 10*3/uL (ref 1.4–6.5)
Neutrophils Relative %: 69 %
PLATELETS: 226 10*3/uL (ref 150–440)
RBC: 4.77 MIL/uL (ref 4.40–5.90)
RDW: 13.3 % (ref 11.5–14.5)
WBC: 5.6 10*3/uL (ref 3.8–10.6)

## 2018-05-21 LAB — COMPREHENSIVE METABOLIC PANEL
ALT: 10 U/L (ref 0–44)
AST: 16 U/L (ref 15–41)
Albumin: 4.4 g/dL (ref 3.5–5.0)
Alkaline Phosphatase: 50 U/L (ref 38–126)
Anion gap: 8 (ref 5–15)
BUN: 15 mg/dL (ref 8–23)
CO2: 25 mmol/L (ref 22–32)
Calcium: 9.1 mg/dL (ref 8.9–10.3)
Chloride: 104 mmol/L (ref 98–111)
Creatinine, Ser: 1.11 mg/dL (ref 0.61–1.24)
GFR calc Af Amer: >60 mL/min (ref >60)
GFR calc non Af Amer: >60 mL/min (ref >60)
Glucose, Bld: 192 mg/dL — ABNORMAL HIGH (ref 70–99)
Potassium: 4 mmol/L (ref 3.5–5.1)
Sodium: 137 mmol/L (ref 135–145)
Total Bilirubin: 0.7 mg/dL (ref 0.3–1.2)
Total Protein: 6.9 g/dL (ref 6.5–8.1)

## 2018-05-21 LAB — FOLATE: Folate: 9.6 ng/mL (ref >5.9)

## 2018-05-21 MED ORDER — CYANOCOBALAMIN 1000 MCG/ML IJ SOLN
1000.0000 ug | Freq: Once | INTRAMUSCULAR | Status: AC
  Administered 2018-05-21: 1000 ug via INTRAMUSCULAR
  Filled 2018-05-21: qty 1

## 2018-05-21 NOTE — Telephone Encounter (Signed)
Called and spoke to Dr. Alphonzo Lemmings receptionist regarding patient's elevated BP.  Patient presented to clinic with BP 150/101 HR 77.  Recheck 161/103 HR 76.  Patient is accompanied by his father today who states he manages his son's medications.  He confirmed that patient has not taken his BP medication today.  Dr. Ouida Sills recommends patient go to [facility].  Patient is reluctant, however, father is in agreement and states he will take him.

## 2018-05-21 NOTE — Progress Notes (Signed)
Here for follow up w father. Pt unable to state the date-believed it to be June -unable to state the year or day of the week. Slowed responses w pt unable to stated meals he has eaten-able to state " dont think I ate today " refd offered fluids/ snack  Per father meds changed to Namenda ( Dr Manuella Ghazi changed  approx one month ago )   1158 repeat BP -161/103  p 76- pt asymptomatic. Denies headache, denies light headedness. Dr Mike Gip informed.

## 2018-05-21 NOTE — Telephone Encounter (Signed)
Entered in error

## 2018-05-22 LAB — PROTEIN ELECTROPHORESIS, SERUM
A/G Ratio: 1.6 (ref 0.7–1.7)
ALPHA-1-GLOBULIN: 0.2 g/dL (ref 0.0–0.4)
Albumin ELP: 3.8 g/dL (ref 2.9–4.4)
Alpha-2-Globulin: 0.6 g/dL (ref 0.4–1.0)
BETA GLOBULIN: 0.8 g/dL (ref 0.7–1.3)
Gamma Globulin: 0.8 g/dL (ref 0.4–1.8)
Globulin, Total: 2.4 g/dL (ref 2.2–3.9)
M-SPIKE, %: 0.4 g/dL — AB
Total Protein ELP: 6.2 g/dL (ref 6.0–8.5)

## 2018-05-22 LAB — IGM: IGM (IMMUNOGLOBULIN M), SRM: 737 mg/dL — AB (ref 20–172)

## 2018-06-18 ENCOUNTER — Inpatient Hospital Stay: Payer: BLUE CROSS/BLUE SHIELD | Attending: Hematology and Oncology

## 2018-06-18 DIAGNOSIS — E538 Deficiency of other specified B group vitamins: Secondary | ICD-10-CM | POA: Insufficient documentation

## 2018-06-18 MED ORDER — CYANOCOBALAMIN 1000 MCG/ML IJ SOLN
1000.0000 ug | Freq: Once | INTRAMUSCULAR | Status: AC
  Administered 2018-06-18: 1000 ug via INTRAMUSCULAR
  Filled 2018-06-18: qty 1

## 2018-07-16 ENCOUNTER — Inpatient Hospital Stay: Payer: Medicare Other | Attending: Hematology and Oncology

## 2018-07-16 DIAGNOSIS — E538 Deficiency of other specified B group vitamins: Secondary | ICD-10-CM

## 2018-07-16 MED ORDER — CYANOCOBALAMIN 1000 MCG/ML IJ SOLN
1000.0000 ug | Freq: Once | INTRAMUSCULAR | Status: AC
  Administered 2018-07-16: 1000 ug via INTRAMUSCULAR

## 2018-08-13 ENCOUNTER — Inpatient Hospital Stay: Payer: Medicare Other | Attending: Hematology and Oncology

## 2018-08-13 DIAGNOSIS — E538 Deficiency of other specified B group vitamins: Secondary | ICD-10-CM | POA: Diagnosis present

## 2018-08-13 MED ORDER — CYANOCOBALAMIN 1000 MCG/ML IJ SOLN
1000.0000 ug | Freq: Once | INTRAMUSCULAR | Status: AC
  Administered 2018-08-13: 1000 ug via INTRAMUSCULAR
  Filled 2018-08-13: qty 1

## 2018-09-10 ENCOUNTER — Inpatient Hospital Stay: Payer: BLUE CROSS/BLUE SHIELD

## 2018-09-11 ENCOUNTER — Other Ambulatory Visit: Payer: Self-pay

## 2018-09-11 ENCOUNTER — Emergency Department
Admission: EM | Admit: 2018-09-11 | Discharge: 2018-09-11 | Disposition: A | Discharge status: Home / Self Care | Payer: Medicare Other | Attending: Emergency Medicine | Admitting: Emergency Medicine

## 2018-09-11 ENCOUNTER — Emergency Department: Payer: Medicare Other

## 2018-09-11 ENCOUNTER — Encounter: Payer: Self-pay | Admitting: Emergency Medicine

## 2018-09-11 DIAGNOSIS — Z7984 Long term (current) use of oral hypoglycemic drugs: Secondary | ICD-10-CM | POA: Insufficient documentation

## 2018-09-11 DIAGNOSIS — Z79899 Other long term (current) drug therapy: Secondary | ICD-10-CM | POA: Diagnosis not present

## 2018-09-11 DIAGNOSIS — R531 Weakness: Secondary | ICD-10-CM | POA: Diagnosis present

## 2018-09-11 DIAGNOSIS — E119 Type 2 diabetes mellitus without complications: Secondary | ICD-10-CM | POA: Insufficient documentation

## 2018-09-11 DIAGNOSIS — J181 Lobar pneumonia, unspecified organism: Secondary | ICD-10-CM

## 2018-09-11 DIAGNOSIS — R202 Paresthesia of skin: Secondary | ICD-10-CM

## 2018-09-11 DIAGNOSIS — J189 Pneumonia, unspecified organism: Secondary | ICD-10-CM | POA: Diagnosis not present

## 2018-09-11 DIAGNOSIS — M25511 Pain in right shoulder: Secondary | ICD-10-CM

## 2018-09-11 DIAGNOSIS — I10 Essential (primary) hypertension: Secondary | ICD-10-CM | POA: Insufficient documentation

## 2018-09-11 LAB — BASIC METABOLIC PANEL
Anion gap: 8 (ref 5–15)
BUN: 15 mg/dL (ref 8–23)
CO2: 30 mmol/L (ref 22–32)
Calcium: 8.7 mg/dL — ABNORMAL LOW (ref 8.9–10.3)
Chloride: 101 mmol/L (ref 98–111)
Creatinine, Ser: 1 mg/dL (ref 0.61–1.24)
GFR calc Af Amer: >60 mL/min (ref >60)
GFR calc non Af Amer: >60 mL/min (ref >60)
GLUCOSE: 111 mg/dL — AB (ref 70–99)
Potassium: 3.9 mmol/L (ref 3.5–5.1)
Sodium: 139 mmol/L (ref 135–145)

## 2018-09-11 LAB — CBC
HCT: 43.4 % (ref 39.0–52.0)
Hemoglobin: 14 g/dL (ref 13.0–17.0)
MCH: 30.2 pg (ref 26.0–34.0)
MCHC: 32.3 g/dL (ref 30.0–36.0)
MCV: 93.5 fL (ref 80.0–100.0)
Platelets: 243 10*3/uL (ref 150–400)
RBC: 4.64 MIL/uL (ref 4.22–5.81)
RDW: 12.5 % (ref 11.5–15.5)
WBC: 9.1 10*3/uL (ref 4.0–10.5)
nRBC: 0 % (ref 0.0–0.2)

## 2018-09-11 MED ORDER — AZITHROMYCIN 500 MG PO TABS
500.0000 mg | ORAL_TABLET | Freq: Once | ORAL | Status: AC
  Administered 2018-09-11: 500 mg via ORAL
  Filled 2018-09-11: qty 1

## 2018-09-11 MED ORDER — AZITHROMYCIN 250 MG PO TABS: ORAL_TABLET | ORAL | 0 refills | Status: AC

## 2018-09-11 MED ORDER — MENTHOL-CAMPHOR 16-11 % EX CREA: 1.0000 "application " | TOPICAL_CREAM | Freq: Two times a day (BID) | CUTANEOUS | 0 refills | Status: AC

## 2018-09-11 MED ORDER — NAPROXEN 375 MG PO TABS
375.0000 mg | ORAL_TABLET | Freq: Once | ORAL | Status: AC
  Administered 2018-09-11: 375 mg via ORAL
  Filled 2018-09-11: qty 1

## 2018-09-11 MED ORDER — NAPROXEN 375 MG PO TABS: 375.0000 mg | ORAL_TABLET | Freq: Two times a day (BID) | ORAL | 0 refills | Status: AC

## 2018-09-11 NOTE — ED Provider Notes (Signed)
[facility] Emergency Department Provider Note  ____________________________________________   First MD Initiated Contact with Patient 09/11/18 [phone]     (approximate)  I have reviewed the triage vital signs and the nursing notes.   HISTORY  Chief Complaint Extremity Weakness   HPI Adrian Valdez is a 68 y.o. male with a history of diabetes, hypertension and neuropathy in his feet and legs as well as progressive dementia over the past 3 years was presented to the emergency department today with several days of right sided extremity weakness as well as pain to his right shoulder.  He is also saying that he has pain to his neck as well but only after I asked him if this is an area that hurts.  He also states that he feels pressure the right side of his head and, per his sister-in-law, had a fall when he rolled out of bed approximately 1 week ago.  Patient says that he also has "tingling" to the right upper extremity to the area overlying the bicep, however only when he raises the right arm.  Denies any injury specifically to the right upper extremity.  Denies any weakness to the right hand.  Patient is not listed as being on a blood thinners per his MAR that came with him from [place].  Sister-in-law also states that he has had a cough.   Past Medical History:  Diagnosis Date  . 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 B12 deficiency 07/27/2015  . Dementia ([place])   . Diabetes mellitus, type 2 ([place])   . Enlarged prostate   . Glaucoma   . Glaucoma   . Hypertension   . Neuropathy    feet and legs  . Urinary frequency   . Vitamin B 12 deficiency   . Waldenstrom's macroglobulinemia [facility])     Patient Active Problem List   Diagnosis Date Noted  . Encounter for antineoplastic immunotherapy 11/20/2016  . Encounter for antineoplastic chemotherapy 10/30/2016  . Altered mental status 09/22/2016  . Waldenstrom's macroglobulinemia ([place]) 09/01/2016  .  Enlarged prostate with urinary obstruction 08/16/2016  . Prostate cancer screening 07/19/2016  . Right Non-Complex Renal Cyst 07/19/2016  . Dysuria 11/30/2015  . Urgency of urination 11/30/2015  . Nocturia 11/30/2015  . 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 B12 deficiency 07/27/2015  . 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 Confusion state 07/07/2015  . BP (high blood pressure) 07/07/2015  . Major depressive disorder, single episode, moderate ([place]) 07/07/2015  . FOM (frequency of micturition) 07/07/2015    Past Surgical History:  Procedure Laterality Date  . CATARACT EXTRACTION W/ INTRAOCULAR LENS IMPLANT    . COLONOSCOPY    . KNEE ARTHROSCOPY Right   . LASIK    . PHOTOCOAGULATION WITH LASER Right 05/24/2017   Procedure: PHOTOCOAGULATION WITH LASER;  Surgeon: Leandrew Koyanagi, MD;  Location: [facility];  Service: Ophthalmology;  Laterality: Right;  Diabetic - oral meds    Prior to Admission medications   Medication Sig Start Date End Date Taking? Authorizing Provider  donepezil (ARICEPT) 5 MG tablet Take 5 mg by mouth at bedtime.  09/20/16 11/16/17  [provider]  finasteride (PROSCAR) 5 MG tablet Take 1 tablet (5 mg total) by mouth daily. Reported on 11/30/2015 02/07/17   Alexis Frock, MD  fluticasone [facility]) 50 MCG/ACT nasal spray Place into the nose. 05/02/18 05/02/19  [provider]  gabapentin (NEURONTIN) 300 MG capsule Take 300 mg by mouth 3 (three) times  daily.    [provider]  glimepiride (AMARYL) 2 MG tablet Take 2 mg by mouth 2 (two) times daily. 12/22/16 11/16/17  [provider]  lisinopril (PRINIVIL,ZESTRIL) 10 MG tablet Take by mouth. Reported on 11/30/2015    [provider]  memantine (NAMENDA) 5 MG tablet TK 1 T PO ONCE D 03/09/18   [provider]  methazolamide (NEPTAZANE) 50 MG tablet TK 1 T PO BID 07/15/15   [provider]  sertraline (ZOLOFT) 25 MG  tablet Take 25 mg by mouth daily.    [provider]  tamsulosin (FLOMAX) 0.4 MG CAPS capsule TAKE ONE CAPSULE BY MOUTH ONCE DAILY, 30 MINUTES AFTER THE SAME MEAL EACH DAY 08/23/16   [provider]  timolol (BETIMOL) 0.5 % ophthalmic solution Apply to eye. Reported on 11/30/2015    [provider]  Travoprost, BAK Free, (TRAVATAN) 0.004 % SOLN ophthalmic solution 1 drop at bedtime.    [provider]    Allergies Oxybutynin and Myrbetriq [mirabegron]  Family History  Problem Relation Age of Onset  . Hypertension Mother   . Hypertension Father   . CAD Father   . Breast cancer Maternal Grandmother   . Ovarian cancer Paternal Grandmother   . Kidney disease Neg Hx   . Prostate cancer Neg Hx     Social History Social History   Tobacco Use  . Smoking status: Never Smoker  . Smokeless tobacco: Never Used  Substance Use Topics  . Alcohol use: Yes    Comment: social drinker wine couple of times a year  . Drug use: No    Review of Systems  Constitutional: No fever/chills Eyes: No visual changes. ENT: No sore throat. Cardiovascular: Denies chest pain. Respiratory: Denies shortness of breath. Gastrointestinal: No abdominal pain.  No nausea, no vomiting.  No diarrhea.  No constipation. Genitourinary: Negative for dysuria. Musculoskeletal: Negative for back pain. Skin: Negative for rash. Neurological: As above   ____________________________________________   PHYSICAL EXAM:  VITAL SIGNS: ED Triage Vitals  Enc Vitals Group     BP 09/11/18 1259 (!) 141/92     Pulse Rate 09/11/18 1259 72     Resp 09/11/18 1259 16     Temp 09/11/18 1259 98.8 F (37.1 C)     Temp Source 09/11/18 1259 Oral     SpO2 09/11/18 1259 98 %     Weight --      Height --      Head Circumference --      Peak Flow --      Pain Score 09/11/18 1605 0     Pain Loc --      Pain Edu? --      Excl. in [place]? --     Constitutional: Alert and oriented to self. Well  appearing and in no acute distress.  Occasionally the patient wanders with his thoughts as we discussed his medical condition.  However, he is easily redirected. Eyes: Conjunctivae are normal.  Head: Atraumatic. Nose: No congestion/rhinnorhea. Mouth/Throat: Mucous membranes are moist.  Neck: No stridor.  No tenderness, deformity or step-off to the cervical spine. Cardiovascular: Normal rate, regular rhythm. Grossly normal heart sounds.  Good peripheral circulation with equal and bilateral radial pulses. Respiratory: Normal respiratory effort.  No retractions.  Decreased breath sounds at the right lower field with minimal rales. Gastrointestinal: Soft and nontender. No distention. No CVA tenderness. Musculoskeletal: No lower extremity tenderness nor edema.  No joint effusions.  5 out of 5 strength  to the bilateral lower extremities with intact sensation.  Left upper extremity with 5 out of 5 strength as well as sensation that is intact light touch.  Right upper extremity with 45 degrees range of motion of abduction as well as flexion.  Range of motion limited due to pain.  Despite this, there is no tenderness over the shoulder or the humerus on the right side.  There is no deformity or ecchymosis.  Distal to the right arm the forearm is neurovascularly intact.  5 out of 5 strength distally.  No tenderness along the trapezius nor is there any tenderness to the cervical spine.  No deformity or step-off to the cervical spine.  Neurologic:  Normal speech and language. No gross focal neurologic deficits are appreciated. Skin:  Skin is warm, dry and intact. No rash noted. Psychiatric: Mood and affect are normal. Speech and behavior are normal.  ____________________________________________   LABS (all labs ordered are listed, but only abnormal results are displayed)  Labs Reviewed  BASIC METABOLIC PANEL - Abnormal; Notable for the following components:      Result Value   Glucose, Bld 111 (*)     Calcium 8.7 (*)    All other components within normal limits  CBC  URINALYSIS, COMPLETE (UACMP) WITH MICROSCOPIC  CBG MONITORING, ED   ____________________________________________  EKG  ED ECG REPORT I, Doran Stabler, the attending physician, personally viewed and interpreted this ECG.   Date: 09/11/2018  EKG Time: 1258  Rate: 73  Rhythm: normal sinus rhythm  Axis: Left axis deviation  Intervals:none  ST&T Change: No ST segment elevation or depression.  No abnormal T wave inversion.  ____________________________________________  RADIOLOGY  Right shoulder x-ray with mild degenerative change without acute abnormality.  Chest x-ray with right basilar atelectasis.  No other focal abnormality.  CT head with mild diffuse cortical atrophy.  Moderate multilevel degenerative disc disease.  No acute abnormality in the cervical spine. ____________________________________________   PROCEDURES  Procedure(s) performed:   Procedures  Critical Care performed:   ____________________________________________   INITIAL IMPRESSION / ASSESSMENT AND PLAN / ED COURSE  Pertinent labs & imaging results that were available during my care of the patient were reviewed by me and considered in my medical decision making (see chart for details).  DDX: Arthritis, cervical radiculopathy, cervical cord compression, CVA, intracranial hemorrhage, neuropathy, myelopathy As part of my medical decision making, I reviewed the following data within the [place] Notes from prior outpatient visits  ----------------------------------------- 5:45 PM on 09/11/2018 -----------------------------------------  Patient with possible nerve compression versus arthritis versus muscle strain to the right shoulder.  Patient's sister-in-law describes this condition as a rapidly progressing dementia over the past 3 years.  He does become quite confused at times during our conversation.  Patient is  likely not a surgical candidate.  We discussed more conservative treatment such as steroids but the patient's family thought that even this would be potentially detrimental secondary to the agitation that the steroids may cause.  We decided to do a several day course of Naprosyn as well as icy hot topical salve.  Furthermore, patient's family is concerned about the patient's cough.  Patient with a normal white blood cell count and no fevers at home.  However, found to have opacification in the right lower field.  Also with positive exam findings to the right lower field.  Patient high risk for pneumonia given dementia.  Will DC with azithromycin as well.  Patient to be discharged at  this time.  Will follow with primary care.  We will also give follow-up with orthopedics. ____________________________________________   FINAL CLINICAL IMPRESSION(S) / ED DIAGNOSES  Shoulder pain.  Weakness.  Paresthesia.  Right lower lobe pneumonia.  NEW MEDICATIONS STARTED DURING THIS VISIT:  New Prescriptions   No medications on file     Note:  This document was prepared using Dragon voice recognition software and may include unintentional dictation errors.     Orbie Pyo, MD 09/11/18 [phone]

## 2018-09-11 NOTE — ED Notes (Signed)
Esign not working at this time. Pt and family verbalized discharge instructions and has no questions at this time. Pt ambulatory with steady gait for discharge.

## 2018-09-11 NOTE — ED Triage Notes (Addendum)
Pt to ED via EMS from [facility] memory care with c/o RT arm weakness xfew days with decreased ROM. PT denies any other symptoms or weakness. No known injury. PT hx of dementia, A&Ox2 , pt at baseline . Speech clear

## 2018-09-11 NOTE — ED Notes (Signed)
Called Pharmacy and spoke with Corene Cornea and requested medication for patient

## 2018-09-11 NOTE — ED Notes (Signed)
Patient transported to X-ray 

## 2018-10-08 ENCOUNTER — Inpatient Hospital Stay: Payer: BLUE CROSS/BLUE SHIELD

## 2018-10-30 ENCOUNTER — Other Ambulatory Visit: Payer: Self-pay

## 2018-10-30 ENCOUNTER — Emergency Department: Payer: Medicare Other

## 2018-10-30 ENCOUNTER — Encounter: Payer: Self-pay | Admitting: Emergency Medicine

## 2018-10-30 ENCOUNTER — Inpatient Hospital Stay
Admission: EM | Admit: 2018-10-30 | Discharge: 2018-11-02 | DRG: 603 | Disposition: A | Discharge status: Home Health | Payer: Medicare Other | Source: Skilled Nursing Facility | Attending: Internal Medicine | Admitting: Internal Medicine

## 2018-10-30 DIAGNOSIS — N400 Enlarged prostate without lower urinary tract symptoms: Secondary | ICD-10-CM | POA: Diagnosis present

## 2018-10-30 DIAGNOSIS — E876 Hypokalemia: Secondary | ICD-10-CM | POA: Diagnosis present

## 2018-10-30 DIAGNOSIS — L03114 Cellulitis of left upper limb: Secondary | ICD-10-CM | POA: Diagnosis present

## 2018-10-30 DIAGNOSIS — F329 Major depressive disorder, single episode, unspecified: Secondary | ICD-10-CM | POA: Diagnosis present

## 2018-10-30 DIAGNOSIS — F0390 Unspecified dementia without behavioral disturbance: Secondary | ICD-10-CM | POA: Diagnosis present

## 2018-10-30 DIAGNOSIS — E538 Deficiency of other specified B group vitamins: Secondary | ICD-10-CM | POA: Diagnosis present

## 2018-10-30 DIAGNOSIS — I959 Hypotension, unspecified: Secondary | ICD-10-CM | POA: Diagnosis present

## 2018-10-30 DIAGNOSIS — E1140 Type 2 diabetes mellitus with diabetic neuropathy, unspecified: Secondary | ICD-10-CM | POA: Diagnosis present

## 2018-10-30 DIAGNOSIS — I10 Essential (primary) hypertension: Secondary | ICD-10-CM | POA: Diagnosis present

## 2018-10-30 DIAGNOSIS — Z8249 Family history of ischemic heart disease and other diseases of the circulatory system: Secondary | ICD-10-CM | POA: Diagnosis not present

## 2018-10-30 DIAGNOSIS — H409 Unspecified glaucoma: Secondary | ICD-10-CM | POA: Diagnosis present

## 2018-10-30 DIAGNOSIS — L03012 Cellulitis of left finger: Principal | ICD-10-CM | POA: Diagnosis present

## 2018-10-30 DIAGNOSIS — L03119 Cellulitis of unspecified part of limb: Secondary | ICD-10-CM | POA: Diagnosis present

## 2018-10-30 LAB — CBC
HCT: 39.5 % (ref 39.0–52.0)
Hemoglobin: 12.9 g/dL — ABNORMAL LOW (ref 13.0–17.0)
MCH: 30.3 pg (ref 26.0–34.0)
MCHC: 32.7 g/dL (ref 30.0–36.0)
MCV: 92.7 fL (ref 80.0–100.0)
Platelets: 230 10*3/uL (ref 150–400)
RBC: 4.26 MIL/uL (ref 4.22–5.81)
RDW: 13.1 % (ref 11.5–15.5)
WBC: 9.7 10*3/uL (ref 4.0–10.5)
nRBC: 0 % (ref 0.0–0.2)

## 2018-10-30 LAB — BASIC METABOLIC PANEL
ANION GAP: 9 (ref 5–15)
BUN: 21 mg/dL (ref 8–23)
CO2: 25 mmol/L (ref 22–32)
Calcium: 8.5 mg/dL — ABNORMAL LOW (ref 8.9–10.3)
Chloride: 104 mmol/L (ref 98–111)
Creatinine, Ser: 0.95 mg/dL (ref 0.61–1.24)
GFR calc Af Amer: >60 mL/min (ref >60)
GFR calc non Af Amer: >60 mL/min (ref >60)
GLUCOSE: 92 mg/dL (ref 70–99)
Potassium: 3.3 mmol/L — ABNORMAL LOW (ref 3.5–5.1)
Sodium: 138 mmol/L (ref 135–145)

## 2018-10-30 LAB — MRSA PCR SCREENING: MRSA BY PCR: POSITIVE — AB

## 2018-10-30 LAB — HEMOGLOBIN A1C
Hgb A1c MFr Bld: 6.8 % — ABNORMAL HIGH (ref 4.8–5.6)
MEAN PLASMA GLUCOSE: 148.46 mg/dL

## 2018-10-30 LAB — SEDIMENTATION RATE: Sed Rate: 34 mm/hr — ABNORMAL HIGH (ref 0–20)

## 2018-10-30 LAB — GLUCOSE, CAPILLARY
Glucose-Capillary: 82 mg/dL (ref 70–99)
Glucose-Capillary: 76 mg/dL (ref 70–99)

## 2018-10-30 LAB — LACTIC ACID, PLASMA
Lactic Acid, Venous: 0.8 mmol/L (ref 0.5–1.9)
Lactic Acid, Venous: 0.8 mmol/L (ref 0.5–1.9)

## 2018-10-30 MED ORDER — SERTRALINE HCL 50 MG PO TABS
50.0000 mg | ORAL_TABLET | Freq: Every day | ORAL | Status: DC
  Administered 2018-10-31 – 2018-11-02 (×3): 50 mg via ORAL
  Filled 2018-10-30 (×3): qty 1

## 2018-10-30 MED ORDER — GUAIFENESIN 100 MG/5ML PO SOLN
400.0000 mg | Freq: Four times a day (QID) | ORAL | Status: DC | PRN
  Filled 2018-10-30: qty 20

## 2018-10-30 MED ORDER — VANCOMYCIN HCL 10 G IV SOLR
1750.0000 mg | INTRAVENOUS | Status: AC
  Administered 2018-10-31 – 2018-11-01 (×2): 1750 mg via INTRAVENOUS
  Filled 2018-10-30 (×2): qty 1750

## 2018-10-30 MED ORDER — METHAZOLAMIDE 25 MG PO TABS
50.0000 mg | ORAL_TABLET | Freq: Every day | ORAL | Status: DC
  Filled 2018-10-30: qty 2

## 2018-10-30 MED ORDER — INSULIN ASPART 100 UNIT/ML ~~LOC~~ SOLN
0.0000 [IU] | Freq: Three times a day (TID) | SUBCUTANEOUS | Status: DC
  Administered 2018-11-01: 1 [IU] via SUBCUTANEOUS
  Filled 2018-10-30: qty 1

## 2018-10-30 MED ORDER — ACETAMINOPHEN 325 MG PO TABS
650.0000 mg | ORAL_TABLET | Freq: Four times a day (QID) | ORAL | Status: DC | PRN
  Administered 2018-10-31: 650 mg via ORAL
  Filled 2018-10-30: qty 2

## 2018-10-30 MED ORDER — MELOXICAM 7.5 MG PO TABS
15.0000 mg | ORAL_TABLET | Freq: Every day | ORAL | Status: DC
  Administered 2018-10-31 – 2018-11-02 (×3): 15 mg via ORAL
  Filled 2018-10-30 (×3): qty 2

## 2018-10-30 MED ORDER — MUPIROCIN 2 % EX OINT
1.0000 "application " | TOPICAL_OINTMENT | Freq: Three times a day (TID) | CUTANEOUS | Status: DC
  Administered 2018-10-31 – 2018-11-02 (×7): 1 via NASAL
  Filled 2018-10-30: qty 22

## 2018-10-30 MED ORDER — FLUTICASONE PROPIONATE 50 MCG/ACT NA SUSP
2.0000 | Freq: Every day | NASAL | Status: DC
  Filled 2018-10-30: qty 16

## 2018-10-30 MED ORDER — ENOXAPARIN SODIUM 40 MG/0.4ML ~~LOC~~ SOLN
40.0000 mg | SUBCUTANEOUS | Status: DC
  Administered 2018-10-30 – 2018-11-01 (×3): 40 mg via SUBCUTANEOUS
  Filled 2018-10-30 (×3): qty 0.4

## 2018-10-30 MED ORDER — ACETAMINOPHEN 650 MG RE SUPP: 650.0000 mg | Freq: Four times a day (QID) | RECTAL | Status: DC | PRN

## 2018-10-30 MED ORDER — INSULIN ASPART 100 UNIT/ML ~~LOC~~ SOLN: 0.0000 [IU] | Freq: Every day | SUBCUTANEOUS | Status: DC

## 2018-10-30 MED ORDER — ONDANSETRON HCL 4 MG PO TABS: 4.0000 mg | ORAL_TABLET | Freq: Four times a day (QID) | ORAL | Status: DC | PRN

## 2018-10-30 MED ORDER — LISINOPRIL 20 MG PO TABS
20.0000 mg | ORAL_TABLET | Freq: Every day | ORAL | Status: DC
  Administered 2018-10-31 – 2018-11-01 (×2): 20 mg via ORAL
  Filled 2018-10-30 (×2): qty 1

## 2018-10-30 MED ORDER — SODIUM CHLORIDE 0.9 % IV BOLUS
1000.0000 mL | Freq: Once | INTRAVENOUS | Status: AC
  Administered 2018-10-30: 1000 mL via INTRAVENOUS

## 2018-10-30 MED ORDER — MEMANTINE HCL 5 MG PO TABS
10.0000 mg | ORAL_TABLET | Freq: Two times a day (BID) | ORAL | Status: DC
  Administered 2018-10-30 – 2018-11-02 (×6): 10 mg via ORAL
  Filled 2018-10-30 (×6): qty 2

## 2018-10-30 MED ORDER — QUETIAPINE FUMARATE 25 MG PO TABS
25.0000 mg | ORAL_TABLET | Freq: Three times a day (TID) | ORAL | Status: DC
  Administered 2018-10-30 – 2018-11-02 (×8): 25 mg via ORAL
  Filled 2018-10-30 (×8): qty 1

## 2018-10-30 MED ORDER — GABAPENTIN 300 MG PO CAPS
300.0000 mg | ORAL_CAPSULE | Freq: Three times a day (TID) | ORAL | Status: DC
  Administered 2018-10-30 – 2018-11-02 (×8): 300 mg via ORAL
  Filled 2018-10-30 (×8): qty 1

## 2018-10-30 MED ORDER — DONEPEZIL HCL 5 MG PO TABS
10.0000 mg | ORAL_TABLET | Freq: Every day | ORAL | Status: DC
  Administered 2018-10-30 – 2018-11-01 (×3): 10 mg via ORAL
  Filled 2018-10-30 (×3): qty 2

## 2018-10-30 MED ORDER — FINASTERIDE 5 MG PO TABS
5.0000 mg | ORAL_TABLET | Freq: Every day | ORAL | Status: DC
  Administered 2018-10-31 – 2018-11-02 (×3): 5 mg via ORAL
  Filled 2018-10-30 (×3): qty 1

## 2018-10-30 MED ORDER — GLIMEPIRIDE 2 MG PO TABS
2.0000 mg | ORAL_TABLET | Freq: Two times a day (BID) | ORAL | Status: DC
  Administered 2018-10-30 – 2018-11-02 (×5): 2 mg via ORAL
  Filled 2018-10-30 (×7): qty 1

## 2018-10-30 MED ORDER — ONDANSETRON HCL 4 MG/2ML IJ SOLN: 4.0000 mg | Freq: Four times a day (QID) | INTRAMUSCULAR | Status: DC | PRN

## 2018-10-30 MED ORDER — TAMSULOSIN HCL 0.4 MG PO CAPS
0.4000 mg | ORAL_CAPSULE | Freq: Every day | ORAL | Status: DC
  Administered 2018-10-31 – 2018-11-01 (×2): 0.4 mg via ORAL
  Filled 2018-10-30 (×2): qty 1

## 2018-10-30 MED ORDER — TIMOLOL MALEATE 0.5 % OP SOLN
1.0000 [drp] | Freq: Two times a day (BID) | OPHTHALMIC | Status: DC
  Administered 2018-10-30 – 2018-11-02 (×6): 1 [drp] via OPHTHALMIC
  Filled 2018-10-30: qty 5

## 2018-10-30 MED ORDER — LATANOPROST 0.005 % OP SOLN
1.0000 [drp] | Freq: Every day | OPHTHALMIC | Status: DC
  Administered 2018-10-30 – 2018-11-01 (×3): 1 [drp] via OPHTHALMIC
  Filled 2018-10-30: qty 2.5

## 2018-10-30 MED ORDER — VANCOMYCIN HCL 10 G IV SOLR
2000.0000 mg | Freq: Once | INTRAVENOUS | Status: AC
  Administered 2018-10-30: 2000 mg via INTRAVENOUS
  Filled 2018-10-30: qty 2000

## 2018-10-30 NOTE — ED Notes (Signed)
ED TO INPATIENT HANDOFF REPORT  Name/Age/Gender Adrian Valdez 68 y.o. male  Code Status   Home/SNF/Other Skilled nursing facility  Chief Complaint left hand infection  Level of Care/Admitting Diagnosis ED Disposition    ED Disposition Condition [place]: [place] [[id number]]  Level of Care: Med-Surg [16]  Diagnosis: Cellulitis of left hand [161096]  Admitting Physician: Henreitta Leber [[id number]]  Attending Physician: Henreitta Leber [[id number]]  Estimated length of stay: past midnight tomorrow  Certification:: I certify this patient will need inpatient services for at least 2 midnights  PT Class (Do Not Modify): Inpatient [101]  PT Acc Code (Do Not Modify): Private [1]       Medical History Past Medical History:  Diagnosis Date  . 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 B12 deficiency 07/27/2015  . Dementia ([place])   . Diabetes mellitus, type 2 ([place])   . Enlarged prostate   . Glaucoma   . Glaucoma   . Hypertension   . Neuropathy    feet and legs  . Urinary frequency   . Vitamin B 12 deficiency   . Waldenstrom's macroglobulinemia ([facility])     Allergies Allergies  Allergen Reactions  . Oxybutynin Other (See Comments)    Headache  . Myrbetriq [Mirabegron] Other (See Comments)    Other reaction(s): Other (See Comments) migraine Hallucinations      IV Location/Drains/Wounds Patient Lines/Drains/Airways Status   Active Line/Drains/Airways    Name:   Placement date:   Placement time:   Site:   Days:   Peripheral IV 10/30/18 Right Arm   10/30/18    1723    Arm   less than 1   Incision (Closed) 05/24/17 Eye Right   05/24/17    0942     524          Labs/Imaging Results for orders placed or performed during the hospital encounter of 10/30/18 (from the past 48 hour(s))  Glucose, capillary     Status: None   Collection Time: 10/30/18  5:21 PM  Result Value Ref Range   Glucose-Capillary 82 70 - 99 mg/dL   CBC     Status: Abnormal   Collection Time: 10/30/18  5:22 PM  Result Value Ref Range   WBC 9.7 4.0 - 10.5 K/uL   RBC 4.26 4.22 - 5.81 MIL/uL   Hemoglobin 12.9 (L) 13.0 - 17.0 g/dL   HCT 39.5 39.0 - 52.0 %   MCV 92.7 80.0 - 100.0 fL   MCH 30.3 26.0 - 34.0 pg   MCHC 32.7 30.0 - 36.0 g/dL   RDW 13.1 11.5 - 15.5 %   Platelets 230 150 - 400 K/uL   nRBC 0.0 0.0 - 0.2 %    Comment: Performed at [facility], [address] [place], [place] [address]  Basic metabolic panel     Status: Abnormal   Collection Time: 10/30/18  5:22 PM  Result Value Ref Range   Sodium 138 135 - 145 mmol/L   Potassium 3.3 (L) 3.5 - 5.1 mmol/L   Chloride 104 98 - 111 mmol/L   CO2 25 22 - 32 mmol/L   Glucose, Bld 92 70 - 99 mg/dL   BUN 21 8 - 23 mg/dL   Creatinine, Ser 0.95 0.61 - 1.24 mg/dL   Calcium 8.5 (L) 8.9 - 10.3 mg/dL   GFR calc non Af Amer >60 >60 mL/min   GFR calc Af Amer >60 >60 mL/min  Anion gap 9 5 - 15    Comment: Performed at [facility], [address] [place], [place] [address]  Sedimentation rate     Status: Abnormal   Collection Time: 10/30/18  5:22 PM  Result Value Ref Range   Sed Rate 34 (H) 0 - 20 mm/hr    Comment: Performed at [facility], [place], [place] [address]  Lactic acid, plasma     Status: None   Collection Time: 10/30/18  5:22 PM  Result Value Ref Range   Lactic Acid, Venous 0.8 0.5 - 1.9 mmol/L    Comment: Performed at [facility], [address] [place], [place] [address]   Dg Hand Complete Left  Result Date: 10/30/2018 CLINICAL DATA:  Redness, swelling of middle finger. EXAM: LEFT HAND - COMPLETE 3+ VIEW COMPARISON:  None. FINDINGS: There is no evidence of fracture or dislocation. There is no evidence of arthropathy or other focal bone abnormality. Soft tissues are unremarkable. IMPRESSION: Negative. Electronically Signed   By: Rolm Baptise M.D.   On: 10/30/2018 17:55    Pending Labs Unresulted Labs  (From admission, onward)    Start     Ordered   10/30/18 1726  Lactic acid, plasma  Now then every 2 hours,   STAT     10/30/18 1725   10/30/18 1726  Urinalysis, Routine w reflex microscopic  ONCE - STAT,   STAT     10/30/18 1725   10/30/18 1726  Blood Culture (routine x 2)  BLOOD CULTURE X 2,   STAT     10/30/18 1725   Signed and Held  CBC  Tomorrow morning,   R     Signed and Held   Signed and Held  CBC  (enoxaparin (LOVENOX)    CrCl >/= 30 ml/min)  Once,   R    Comments:  Baseline for enoxaparin therapy IF NOT ALREADY DRAWN.  Notify MD if PLT < 100 K.    Signed and Held   Signed and Held  Creatinine, serum  (enoxaparin (LOVENOX)    CrCl >/= 30 ml/min)  Once,   R    Comments:  Baseline for enoxaparin therapy IF NOT ALREADY DRAWN.    Signed and Held   Signed and Held  Creatinine, serum  (enoxaparin (LOVENOX)    CrCl >/= 30 ml/min)  Weekly,   R    Comments:  while on enoxaparin therapy    Signed and Held          Vitals/Pain Today's Vitals   10/30/18 1716 10/30/18 1717 10/30/18 1851 10/30/18 1956  BP:  (!) 91/56 108/69 117/68  Pulse: 69  (!) 56 (!) 55  Resp: 18  18 16   Temp: 98.4 F (36.9 C)   (!) 97.5 F (36.4 C)  TempSrc: Oral   Oral  SpO2: 97%  95% 98%  Weight: 95.7 kg     Height: 5\' 11"  (1.803 m)     PainSc: 0-No pain       Isolation Precautions No active isolations  Medications Medications  vancomycin (VANCOCIN) 1,750 mg in sodium chloride 0.9 % 500 mL IVPB (has no administration in time range)  vancomycin (VANCOCIN) 2,000 mg in sodium chloride 0.9 % 500 mL IVPB (2,000 mg Intravenous New Bag/Given 10/30/18 1755)  sodium chloride 0.9 % bolus 1,000 mL (0 mLs Intravenous Stopped 10/30/18 1956)    Mobility walks with device

## 2018-10-30 NOTE — ED Provider Notes (Signed)
[facility] Emergency Department Provider Note  ____________________________________________  Time seen: Approximately 6:38 PM  I have reviewed the triage vital signs and the nursing notes.   HISTORY  Chief Complaint Cellulitis    HPI Adrian Valdez is a 68 y.o. male with DM 2, right-handed, presenting with left middle finger swelling, now with erythema in the hand and arm, hypotension on arrival.  Patient has no history of injury, and is not sure when his symptoms started.  He has not had any fevers or chills, nausea or vomiting.  Has not tried anything for his symptoms.  He denies any associated pain.  Past Medical History:  Diagnosis Date  . 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 B12 deficiency 07/27/2015  . Dementia ([place])   . Diabetes mellitus, type 2 ([place])   . Enlarged prostate   . Glaucoma   . Glaucoma   . Hypertension   . Neuropathy    feet and legs  . Urinary frequency   . Vitamin B 12 deficiency   . Waldenstrom's macroglobulinemia [facility])     Patient Active Problem List   Diagnosis Date Noted  . Encounter for antineoplastic immunotherapy 11/20/2016  . Encounter for antineoplastic chemotherapy 10/30/2016  . Altered mental status 09/22/2016  . Waldenstrom's macroglobulinemia ([place]) 09/01/2016  . Enlarged prostate with urinary obstruction 08/16/2016  . Prostate cancer screening 07/19/2016  . Right Non-Complex Renal Cyst 07/19/2016  . Dysuria 11/30/2015  . Urgency of urination 11/30/2015  . Nocturia 11/30/2015  . 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 B12 deficiency 07/27/2015  . 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 Confusion state 07/07/2015  . BP (high blood pressure) 07/07/2015  . Major depressive disorder, single episode, moderate ([place]) 07/07/2015  . FOM (frequency of micturition) 07/07/2015    Past Surgical History:  Procedure Laterality Date  . CATARACT EXTRACTION W/ INTRAOCULAR  LENS IMPLANT    . COLONOSCOPY    . KNEE ARTHROSCOPY Right   . LASIK    . PHOTOCOAGULATION WITH LASER Right 05/24/2017   Procedure: PHOTOCOAGULATION WITH LASER;  Surgeon: Leandrew Koyanagi, MD;  Location: [facility];  Service: Ophthalmology;  Laterality: Right;  Diabetic - oral meds    Current Outpatient Rx  . Order #: [id number] Class: Historical Med  . Order #: [id number] Class: Normal  . Order #: [id number] Class: Historical Med  . Order #: [id number] Class: Historical Med  . Order #: [id number] Class: Historical Med  . Order #: [id number] Class: Historical Med  . Order #: [id number] Class: Historical Med  . Order #: [id number] Class: Historical Med  . Order #: [id number] Class: Historical Med  . Order #: [id number] Class: Historical Med  . Order #: [id number] Class: Historical Med  . Order #: [id number] Class: Historical Med    Allergies Oxybutynin and Myrbetriq [mirabegron]  Family History  Problem Relation Age of Onset  . Hypertension Mother   . Hypertension Father   . CAD Father   . Breast cancer Maternal Grandmother   . Ovarian cancer Paternal Grandmother   . Kidney disease Neg Hx   . Prostate cancer Neg Hx     Social History Social History   Tobacco Use  . Smoking status: Never Smoker  . Smokeless tobacco: Never Used  Substance Use Topics  . Alcohol use: Yes    Comment: social drinker wine couple of times a year  . Drug use: No    Review of Systems Constitutional: No fever/chills.  No lightheadedness or syncope. Eyes: No visual changes. ENT: No sore throat. No  congestion or rhinorrhea. Cardiovascular: Denies chest pain. Denies palpitations.  Positive hypotension. Respiratory: Denies shortness of breath.  No cough. Gastrointestinal: No abdominal pain.  No nausea, no vomiting.  No diarrhea.  No constipation. Genitourinary: Negative for dysuria. Musculoskeletal: Negative for back pain.  Acid of left middle finger infection with surrounding erythema, swelling; no  pain. Skin: Negative for rash. Neurological: Negative for headaches. No focal numbness, tingling or weakness.     ____________________________________________   PHYSICAL EXAM:  VITAL SIGNS: ED Triage Vitals  Enc Vitals Group     BP 10/30/18 1717 (!) 91/56     Pulse Rate 10/30/18 1716 69     Resp 10/30/18 1716 18     Temp 10/30/18 1716 98.4 F (36.9 C)     Temp Source 10/30/18 1716 Oral     SpO2 10/30/18 1716 97 %     Weight 10/30/18 1716 211 lb (95.7 kg)     Height 10/30/18 1716 5\' 11"  (1.803 m)     Head Circumference --      Peak Flow --      Pain Score 10/30/18 1716 0     Pain Loc --      Pain Edu? --      Excl. in [place]? --     Constitutional: Alert and oriented. Answers questions appropriately. Eyes: Conjunctivae are normal.  EOMI. No scleral icterus. Head: Atraumatic. Nose: No congestion/rhinnorhea. Mouth/Throat: Mucous membranes are moist.  Neck: No stridor.  Supple.   Cardiovascular: Normal rate Respiratory: Normal respiratory effort.  Musculoskeletal: The patient has significant swelling with some blistering over the dorsal aspect of the middle finger on the left hand.  There is some purulence/fluctuance at the edge of the nail bed.  He has significant swelling of the middle finger digit with overlying erythema, which tracks into the hand and has evidence of lymphatic streaking into the forearm.  The patient has normal radial pulse.  He has no significant tenderness to palpation. Neurologic:  A&Ox3.  Speech is clear.  Face and smile are symmetric.  EOMI.  Moves all extremities well. Skin:  Skin is warm, dry and intact. No rash noted. Psychiatric: Mood and affect are normal. Speech and behavior are normal.  Normal judgement.  ____________________________________________   LABS (all labs ordered are listed, but only abnormal results are displayed)  Labs Reviewed  CBC - Abnormal; Notable for the following components:      Result Value   Hemoglobin 12.9 (*)     All other components within normal limits  BASIC METABOLIC PANEL - Abnormal; Notable for the following components:   Potassium 3.3 (*)    Calcium 8.5 (*)    All other components within normal limits  SEDIMENTATION RATE - Abnormal; Notable for the following components:   Sed Rate 34 (*)    All other components within normal limits  CULTURE, BLOOD (ROUTINE X 2)  CULTURE, BLOOD (ROUTINE X 2)  GLUCOSE, CAPILLARY  LACTIC ACID, PLASMA  LACTIC ACID, PLASMA  URINALYSIS, ROUTINE W REFLEX MICROSCOPIC   ____________________________________________  EKG  ED ECG REPORT I, Anne-Caroline Mariea Clonts, the attending physician, personally viewed and interpreted this ECG.   Date: 10/30/2018  EKG Time: 1949  Rate: 58  Rhythm: sinus bradycardia  Axis: leftward  Intervals:none  ST&T Change: No STEMI  ____________________________________________  RADIOLOGY  Dg Hand Complete Left  Result Date: 10/30/2018 CLINICAL DATA:  Redness, swelling of middle finger. EXAM: LEFT HAND - COMPLETE 3+ VIEW COMPARISON:  None. FINDINGS: There is no evidence  of fracture or dislocation. There is no evidence of arthropathy or other focal bone abnormality. Soft tissues are unremarkable. IMPRESSION: Negative. Electronically Signed   By: Rolm Baptise M.D.   On: 10/30/2018 17:55    ____________________________________________   PROCEDURES  Procedure(s) performed: None  Procedures  Critical Care performed: Yes ____________________________________________   INITIAL IMPRESSION / ASSESSMENT AND PLAN / ED COURSE  Pertinent labs & imaging results that were available during my care of the patient were reviewed by me and considered in my medical decision making (see chart for details).  68 y.o. diabetic, right-handed, with left middle finger infection that likely started as a paronychia and has now significantly spread throughout the entire left hand with lymphatic streaking into the arm.  The patient was immediately  pulled from triage to get basic blood work including blood cultures because he is hypotensive.  He was given intravenous fluids, as well as immediate antibiotics with vancomycin.  Hand x-ray does not show any free air.  I have consulted orthopedics for evaluation and the patient will be admitted to the hospitalist.  ----------------------------------------- 6:54 PM on 10/30/2018 -----------------------------------------  I spoken with Dr. Marry Guan, the orthopedist on call.  I do not feel the patient needs an urgent consult, but the physician is aware should the patient's infection worsen or require intervention.  CRITICAL CARE Performed by: Eula Listen   Total critical care time: 35 minutes  Critical care time was exclusive of separately billable procedures and treating other patients.  Critical care was necessary to treat or prevent imminent or life-threatening deterioration.  Critical care was time spent personally by me on the following activities: development of treatment plan with patient and/or surrogate as well as nursing, discussions with consultants, evaluation of patient's response to treatment, examination of patient, obtaining history from patient or surrogate, ordering and performing treatments and interventions, ordering and review of laboratory studies, ordering and review of radiographic studies, pulse oximetry and re-evaluation of patient's condition.   ____________________________________________  FINAL CLINICAL IMPRESSION(S) / ED DIAGNOSES  Final diagnoses:  Paronychia of left middle finger  Cellulitis of left hand  Hypotension, unspecified hypotension type         NEW MEDICATIONS STARTED DURING THIS VISIT:  New Prescriptions   No medications on file      Eula Listen, MD 10/30/18 2029

## 2018-10-30 NOTE — ED Triage Notes (Addendum)
PT arrives with sister from [employer]. Pt has significant redness and swelling to left hand with worsening at the middle finger. Unknown timing of when swelling/reddness started. Pt denies pain

## 2018-10-30 NOTE — Consult Note (Signed)
ORTHOPAEDIC CONSULTATION  PATIENT NAME: Adrian Valdez DOB: [birth year]  MRN: [id number]  REQUESTING PHYSICIAN: Henreitta Leber, MD  Chief Complaint: Left long finger cellulitis  HPI: Celester Shedrick Sarli is a 68 y.o. right-hand-dominant male who complains of pain, redness, and swelling to the left long finger.  The patient is a poor historian and is difficult to determine the exact timeframe.  He did apparently receive a manicure last week at his assisted living facility.  He was apparently evaluated by the facility physician earlier today and was noted to have worsening of the erythema that was extending into the dorsum of the left hand.  No apparent fevers or chills.  Past Medical History:  Diagnosis Date  . 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 B12 deficiency 07/27/2015  . Dementia ([place])   . Diabetes mellitus, type 2 ([place])   . Enlarged prostate   . Glaucoma   . Glaucoma   . Hypertension   . Neuropathy    feet and legs  . Urinary frequency   . Vitamin B 12 deficiency   . Waldenstrom's macroglobulinemia [facility])    Past Surgical History:  Procedure Laterality Date  . CATARACT EXTRACTION W/ INTRAOCULAR LENS IMPLANT    . COLONOSCOPY    . KNEE ARTHROSCOPY Right   . LASIK    . PHOTOCOAGULATION WITH LASER Right 05/24/2017   Procedure: PHOTOCOAGULATION WITH LASER;  Surgeon: Leandrew Koyanagi, MD;  Location: [place] [facility];  Service: Ophthalmology;  Laterality: Right;  Diabetic - oral meds   Social History   Socioeconomic History  . Marital status: Single    Spouse name: Not on file  . Number of children: Not on file  . Years of education: Not on file  . Highest education level: Not on file  Occupational History  . Not on file  Social Needs  . Financial resource strain: Not on file  . Food insecurity:    Worry: Not on file    Inability: Not on file  . Transportation needs:    Medical: Not on file    Non-medical: Not on file  Tobacco Use  . Smoking status:  Never Smoker  . Smokeless tobacco: Never Used  Substance and Sexual Activity  . Alcohol use: Yes    Comment: social drinker wine couple of times a year  . Drug use: No  . Sexual activity: Not on file  Lifestyle  . Physical activity:    Days per week: Not on file    Minutes per session: Not on file  . Stress: Not on file  Relationships  . Social connections:    Talks on phone: Not on file    Gets together: Not on file    Attends religious service: Not on file    Active member of club or organization: Not on file    Attends meetings of clubs or organizations: Not on file    Relationship status: Not on file  Other Topics Concern  . Not on file  Social History Narrative  . Not on file   Family History  Problem Relation Age of Onset  . Hypertension Mother   . Hypertension Father   . CAD Father   . Breast cancer Maternal Grandmother   . Ovarian cancer Paternal Grandmother   . Kidney disease Neg Hx   . Prostate cancer Neg Hx    Allergies  Allergen Reactions  . Oxybutynin Other (See Comments)    Headache  . Myrbetriq [Mirabegron] Other (See Comments)  Other reaction(s): Other (See Comments) migraine Hallucinations     Prior to Admission medications   Medication Sig Start Date End Date Taking? Authorizing Provider  acetaminophen (TYLENOL) 650 MG CR tablet Take 650 mg by mouth every 6 (six) hours as needed for pain.   Yes [provider]  diclofenac sodium (VOLTAREN) 1 % GEL Apply 2 g topically every 4 (four) hours as needed for pain.   Yes [provider]  donepezil (ARICEPT) 10 MG tablet Take 10 mg by mouth at bedtime.  09/20/16 10/30/18 Yes [provider]  finasteride (PROSCAR) 5 MG tablet Take 1 tablet (5 mg total) by mouth daily. Reported on 11/30/2015 02/07/17  Yes Alexis Frock, MD  fluticasone [facility]) 50 MCG/ACT nasal spray Place 2 sprays into both nostrils daily.  05/02/18 05/02/19 Yes [provider]  gabapentin (NEURONTIN) 300  MG capsule Take 300 mg by mouth 3 (three) times daily.   Yes [provider]  glimepiride (AMARYL) 2 MG tablet Take 2 mg by mouth 2 (two) times daily. 12/22/16 10/30/18 Yes [provider]  guaifenesin (HUMIBID E) 400 MG TABS tablet Take 400 mg by mouth every 6 (six) hours as needed (cough/congestion).   Yes [provider]  lisinopril (PRINIVIL,ZESTRIL) 20 MG tablet Take 20 mg by mouth daily. Reported on 11/30/2015   Yes [provider]  meloxicam (MOBIC) 15 MG tablet Take 15 mg by mouth daily.   Yes [provider]  memantine (NAMENDA) 10 MG tablet Take 10 mg by mouth 2 (two) times daily.  03/09/18  Yes [provider]  methazolamide (NEPTAZANE) 50 MG tablet Take 50 mg by mouth daily.  07/15/15  Yes [provider]  mupirocin ointment (BACTROBAN) 2 % Place 1 application into the nose 3 (three) times daily.   Yes [provider]  QUEtiapine (SEROQUEL) 25 MG tablet Take 25 mg by mouth 3 (three) times daily.   Yes [provider]  sertraline (ZOLOFT) 50 MG tablet Take 50 mg by mouth daily.    Yes [provider]  tamsulosin (FLOMAX) 0.4 MG CAPS capsule TAKE ONE CAPSULE BY MOUTH ONCE DAILY, 30 MINUTES AFTER THE SAME MEAL EACH DAY 08/23/16  Yes [provider]  timolol (BETIMOL) 0.5 % ophthalmic solution Place 1 drop into both eyes 2 (two) times daily. Reported on 11/30/2015   Yes [provider]  Travoprost, BAK Free, (TRAVATAN) 0.004 % SOLN ophthalmic solution Place 1 drop into both eyes at bedtime.    Yes [provider]  triamcinolone cream (KENALOG) 0.1 % Apply 1 application topically 2 (two) times daily.   Yes [provider]  vitamin B-12 (CYANOCOBALAMIN) 1000 MCG tablet Take 1,000 mcg by mouth. One time every month on the 8th of every month   Yes [provider]   Dg Hand Complete Left  Result Date: 10/30/2018 CLINICAL DATA:  Redness, swelling of middle finger. EXAM:  LEFT HAND - COMPLETE 3+ VIEW COMPARISON:  None. FINDINGS: There is no evidence of fracture or dislocation. There is no evidence of arthropathy or other focal bone abnormality. Soft tissues are unremarkable. IMPRESSION: Negative. Electronically Signed   By: Rolm Baptise M.D.   On: 10/30/2018 17:55    Positive ROS: All other systems have been reviewed and were otherwise negative with the exception of those mentioned in the HPI and as above.  Physical Exam: General: Well developed, well nourished male seen in no acute distress. HEENT: Atraumatic and normocephalic. Sclera are clear. Extraocular motion is  intact. Oropharynx is clear with moist mucosa. Neck: Supple, nontender, good range of motion. Lungs: Clear to auscultation bilaterally. Cardiovascular: Regular rate and rhythm with normal S1 and S2. No murmurs. No gallops or rubs. Pedal pulses are palpable bilaterally.  Abdomen: Soft, nontender, and nondistended. Bowel sounds are present. Skin: See below Neurologic: Awake, alert. Sensory function is grossly intact. Motor strength is felt to be 5 over 5 bilaterally. No clonus or tremor. Good motor coordination. Lymphatic: No axillary or cervical lymphadenopathy  MUSCULOSKELETAL: Examination of the left hand demonstrates erythema to the distal aspect of the long finger with some erythema extending along the dorsum of the digit and to the dorsum of the hand.  Some lesser changes are noted to the wrist and dorsum of the forearm.  There is tenderness to palpation along the distal pulp space of the long finger.  The nail is intact.  Some drainage is noted along the cuticle.  Assessment: Left long finger paronychial infection with cellulitis  Plan: The findings were discussed with the patient and his family.  He was given a loading dose of vancomycin in the Emergency Department.  A bulky hand dressing was applied to work on swelling.  I discussed the probable need for incision, irrigation, and  debridement of the left middle finger.  I will reassess his status tomorrow.  In the meantime, continue with antibiotic coverage.  James P. Holley Bouche M.D.

## 2018-10-30 NOTE — ED Notes (Signed)
Patient transported to X-ray 

## 2018-10-30 NOTE — Consult Note (Signed)
Pharmacy Antibiotic Note  Adrian Valdez is a 68 y.o. male admitted on 10/30/2018 with cellulitis.  Pharmacy has been consulted for Vancomycin dosing.  Plan: Pt received Vancomycin IV 2000mg  loading dose in ED  Vancomycin 1750 mg IV Q 24 hrs. Goal AUC 400-550. Expected AUC: 507.8 SCr used: 0.95    Height: 5\' 11"  (180.3 cm) Weight: 211 lb (95.7 kg) IBW/kg (Calculated) : 75.3  Temp (24hrs), Avg:98.4 F (36.9 C), Min:98.4 F (36.9 C), Max:98.4 F (36.9 C)  Recent Labs  Lab 10/30/18 1722  WBC 9.7  CREATININE 0.95  LATICACIDVEN 0.8    Estimated Creatinine Clearance: 90.3 mL/min (by C-G formula based on SCr of 0.95 mg/dL).    Allergies  Allergen Reactions  . Oxybutynin Other (See Comments)    Headache  . Myrbetriq [Mirabegron] Other (See Comments)    Other reaction(s): Other (See Comments) migraine Hallucinations      Antimicrobials this admission: Vancomycin 2/25 >>  Dose adjustments this admission: N/A  Microbiology results: 2/25 BCx: pending   Thank you for allowing pharmacy to be a part of this patient's care.  Lu Duffel, PharmD, BCPS Clinical Pharmacist 10/30/2018 7:40 PM

## 2018-10-30 NOTE — H&P (Signed)
[facility] at [place] NAME: Adrian Valdez    MR#:  [id number]  DATE OF BIRTH:  11-24-1950  DATE OF ADMISSION:  10/30/2018  PRIMARY CARE PHYSICIAN: Merlene Laughter, MD   REQUESTING/REFERRING PHYSICIAN: Dr. Aundria Rud   CHIEF COMPLAINT:   Chief Complaint  Patient presents with  . Cellulitis    HISTORY OF PRESENT ILLNESS:  Adrian Valdez  is a 68 y.o. male with a known history of dementia, diabetes, hypertension, neuropathy, history of Walden Strom's macroglobulinemia, glaucoma who presents to the hospital due to a left hand cellulitis.  Patient given his dementia is a very poor historian therefore most history obtained from the sister at bedside and also from the ER staff and physician.  Patient apparently was sent from an assisted living due to redness swelling of his left third finger and redness streaking up his left arm.  Patient presented to the ER was clinically diagnosed with left third finger paronychial infection with proposed cellulitis and therefore hospitalist services were contacted for admission.  Patient has no fever, chills.   PAST MEDICAL HISTORY:   Past Medical History:  Diagnosis Date  . 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 B12 deficiency 07/27/2015  . Dementia ([place])   . Diabetes mellitus, type 2 ([place])   . Enlarged prostate   . Glaucoma   . Glaucoma   . Hypertension   . Neuropathy    feet and legs  . Urinary frequency   . Vitamin B 12 deficiency   . Waldenstrom's macroglobulinemia ([place])     PAST SURGICAL HISTORY:   Past Surgical History:  Procedure Laterality Date  . CATARACT EXTRACTION W/ INTRAOCULAR LENS IMPLANT    . COLONOSCOPY    . KNEE ARTHROSCOPY Right   . LASIK    . PHOTOCOAGULATION WITH LASER Right 05/24/2017   Procedure: PHOTOCOAGULATION WITH LASER;  Surgeon: Leandrew Koyanagi, MD;  Location: [facility];  Service: Ophthalmology;  Laterality: Right;  Diabetic - oral meds     SOCIAL HISTORY:   Social History   Tobacco Use  . Smoking status: Never Smoker  . Smokeless tobacco: Never Used  Substance Use Topics  . Alcohol use: Yes    Comment: social drinker wine couple of times a year    FAMILY HISTORY:   Family History  Problem Relation Age of Onset  . Hypertension Mother   . Hypertension Father   . CAD Father   . Breast cancer Maternal Grandmother   . Ovarian cancer Paternal Grandmother   . Kidney disease Neg Hx   . Prostate cancer Neg Hx     DRUG ALLERGIES:   Allergies  Allergen Reactions  . Oxybutynin Other (See Comments)    Headache  . Myrbetriq [Mirabegron] Other (See Comments)    Other reaction(s): Other (See Comments) migraine Hallucinations      REVIEW OF SYSTEMS:   Review of Systems  Unable to perform ROS: Dementia    MEDICATIONS AT HOME:   Prior to Admission medications   Medication Sig Start Date End Date Taking? Authorizing Provider  acetaminophen (TYLENOL) 650 MG CR tablet Take 650 mg by mouth every 6 (six) hours as needed for pain.   Yes [provider]  diclofenac sodium (VOLTAREN) 1 % GEL Apply 2 g topically every 4 (four) hours as needed for pain.   Yes [provider]  donepezil (ARICEPT) 10 MG tablet Take 10 mg by mouth at bedtime.  09/20/16 10/30/18 Yes [provider]  finasteride (PROSCAR) 5 MG tablet Take 1 tablet (5 mg total) by mouth daily. Reported on 11/30/2015 02/07/17  Yes Alexis Frock, MD  fluticasone [facility]) 50 MCG/ACT nasal spray Place 2 sprays into both nostrils daily.  05/02/18 05/02/19 Yes [provider]  gabapentin (NEURONTIN) 300 MG capsule Take 300 mg by mouth 3 (three) times daily.   Yes [provider]  glimepiride (AMARYL) 2 MG tablet Take 2 mg by mouth 2 (two) times daily. 12/22/16 10/30/18 Yes [provider]  guaifenesin (HUMIBID E) 400 MG TABS tablet Take 400 mg by mouth every 6 (six) hours as needed (cough/congestion).   Yes [provider]  lisinopril (PRINIVIL,ZESTRIL) 20 MG tablet Take 20 mg by mouth daily. Reported on 11/30/2015   Yes [provider]  meloxicam (MOBIC) 15 MG tablet Take 15 mg by mouth daily.   Yes [provider]  memantine (NAMENDA) 10 MG tablet Take 10 mg by mouth 2 (two) times daily.  03/09/18  Yes [provider]  methazolamide (NEPTAZANE) 50 MG tablet Take 50 mg by mouth daily.  07/15/15  Yes [provider]  mupirocin ointment (BACTROBAN) 2 % Place 1 application into the nose 3 (three) times daily.   Yes [provider]  QUEtiapine (SEROQUEL) 25 MG tablet Take 25 mg by mouth 3 (three) times daily.   Yes [provider]  sertraline (ZOLOFT) 50 MG tablet Take 50 mg by mouth daily.    Yes [provider]  tamsulosin (FLOMAX) 0.4 MG CAPS capsule TAKE ONE CAPSULE BY MOUTH ONCE DAILY, 30 MINUTES AFTER THE SAME MEAL EACH DAY 08/23/16  Yes [provider]  timolol (BETIMOL) 0.5 % ophthalmic solution Place 1 drop into both eyes 2 (two) times daily. Reported on 11/30/2015   Yes [provider]  Travoprost, BAK Free, (TRAVATAN) 0.004 % SOLN ophthalmic solution Place 1 drop into both eyes at bedtime.    Yes [provider]  triamcinolone cream (KENALOG) 0.1 % Apply 1 application topically 2 (two) times daily.   Yes [provider]  vitamin B-12 (CYANOCOBALAMIN) 1000 MCG tablet Take 1,000 mcg by mouth. One time every month on the 8th of every month   Yes [provider]      VITAL SIGNS:  Blood pressure 108/69, pulse (!) 56, temperature 98.4 F (36.9 C), temperature source Oral, resp. rate 18, height 5\' 11"  (1.803 m), weight 95.7 kg, SpO2 95 %.  PHYSICAL EXAMINATION:  Physical Exam  GENERAL:  68 y.o.-year-old patient lying in the bed in no acute distress.  EYES: Pupils equal, round, reactive to light and accommodation. No scleral icterus. Extraocular muscles intact.  HEENT: Head atraumatic,  normocephalic. Oropharynx and nasopharynx clear. No oropharyngeal erythema, moist oral mucosa  NECK:  Supple, no jugular venous distention. No thyroid enlargement, no tenderness.  LUNGS: Normal breath sounds bilaterally, no wheezing, rales, rhonchi. No use of accessory muscles of respiration.  CARDIOVASCULAR: S1, S2 RRR. No murmurs, rubs, gallops, clicks.  ABDOMEN: Soft, nontender, nondistended. Bowel sounds present. No organomegaly or mass.  EXTREMITIES: No pedal edema, cyanosis, or clubbing. + 2 pedal & radial pulses b/l.  Left third finger redness and swelling with paronychial infection as shown below.    NEUROLOGIC: Cranial nerves II through XII are intact. No focal Motor or sensory deficits appreciated b/l. Globally weak PSYCHIATRIC: The patient is alert and oriented x 1.  SKIN: No obvious rash, lesion, or ulcer.   LABORATORY PANEL:   CBC Recent Labs  Lab 10/30/18 1722  WBC 9.7  HGB 12.9*  HCT 39.5  PLT 230   ------------------------------------------------------------------------------------------------------------------  Chemistries  Recent Labs  Lab 10/30/18 1722  NA 138  K 3.3*  CL 104  CO2 25  GLUCOSE 92  BUN 21  CREATININE 0.95  CALCIUM 8.5*   ------------------------------------------------------------------------------------------------------------------  Cardiac Enzymes No results for input(s): TROPONINI in the last 168 hours. ------------------------------------------------------------------------------------------------------------------  RADIOLOGY:  Dg Hand Complete Left  Result Date: 10/30/2018 CLINICAL DATA:  Redness, swelling of middle finger. EXAM: LEFT HAND - COMPLETE 3+ VIEW COMPARISON:  None. FINDINGS: There is no evidence of fracture or dislocation. There is no evidence of arthropathy or other focal bone abnormality. Soft tissues are unremarkable. IMPRESSION: Negative. Electronically Signed   By: Rolm Baptise M.D.   On: 10/30/2018 17:55      IMPRESSION AND PLAN:   68 year old male with known history of dementia, diabetes, hypertension, neuropathy, history of Walden Strom's macroglobulinemia, glaucoma who presents to the hospital due to a left hand cellulitis.    1.  Left third finger paronychial infection with superimposed cellulitis- we will treat the patient with IV vancomycin, follow clinically.  Patient is afebrile with a normal white cell count. - Patient's x-ray shows no foreign body.  We will get orthopedics to see if he would benefit from an drainage of the paronychial infection.  2.  Dementia-continue Aricept, Namenda.  3.  Glaucoma-continue timolol Travatan eye drops.   4.  Diabetes type 2 without complication-continue glimepiride, sliding scale insulin.   5.  BPH-continue finasteride, Flomax.  6.  Neuropathy-continue gabapentin.  7.  Depression-continue Zoloft.  8. Essential HTN - cont. Lisinopril.     All the records are reviewed and case discussed with ED provider. Management plans discussed with the patient, family and they are in agreement.  CODE STATUS: Full code  TOTAL TIME TAKING CARE OF THIS PATIENT: 40 minutes.    Henreitta Leber M.D on 10/30/2018 at 7:54 PM  Between 7am to 6pm - Pager - [phone]  After 6pm go to [email or web address] - password [email or web address] [facility]  Office  [phone]  CC: Primary care physician; Merlene Laughter, MD

## 2018-10-31 ENCOUNTER — Encounter
Admission: EM | Disposition: A | Discharge status: Home Health | Payer: Self-pay | Source: Skilled Nursing Facility | Attending: Internal Medicine

## 2018-10-31 LAB — CBC
HCT: 37.4 % — ABNORMAL LOW (ref 39.0–52.0)
Hemoglobin: 11.6 g/dL — ABNORMAL LOW (ref 13.0–17.0)
MCH: 29.7 pg (ref 26.0–34.0)
MCHC: 31 g/dL (ref 30.0–36.0)
MCV: 95.9 fL (ref 80.0–100.0)
Platelets: 163 10*3/uL (ref 150–400)
RBC: 3.9 MIL/uL — ABNORMAL LOW (ref 4.22–5.81)
RDW: 13.2 % (ref 11.5–15.5)
WBC: 6.2 10*3/uL (ref 4.0–10.5)
nRBC: 0 % (ref 0.0–0.2)

## 2018-10-31 LAB — GLUCOSE, CAPILLARY
Glucose-Capillary: 74 mg/dL (ref 70–99)
Glucose-Capillary: 77 mg/dL (ref 70–99)
Glucose-Capillary: 98 mg/dL (ref 70–99)
Glucose-Capillary: 154 mg/dL — ABNORMAL HIGH (ref 70–99)

## 2018-10-31 SURGERY — INCISION AND DRAINAGE, ABSCESS
Anesthesia: Choice | Laterality: Left

## 2018-10-31 MED ORDER — ACETAZOLAMIDE ER 500 MG PO CP12
500.0000 mg | ORAL_CAPSULE | Freq: Two times a day (BID) | ORAL | Status: DC
  Administered 2018-10-31 – 2018-11-02 (×5): 500 mg via ORAL
  Filled 2018-10-31 (×6): qty 1

## 2018-10-31 MED ORDER — POTASSIUM CHLORIDE CRYS ER 20 MEQ PO TBCR
40.0000 meq | EXTENDED_RELEASE_TABLET | Freq: Once | ORAL | Status: AC
  Administered 2018-10-31: 40 meq via ORAL
  Filled 2018-10-31: qty 2

## 2018-10-31 SURGICAL SUPPLY — 20 items
BANDAGE ELASTIC 3 LF NS (GAUZE/BANDAGES/DRESSINGS) ×3 IMPLANT
BNDG ESMARK 4X12 TAN STRL LF (GAUZE/BANDAGES/DRESSINGS) ×3 IMPLANT
CANISTER SUCT 1200ML W/VALVE (MISCELLANEOUS) ×3 IMPLANT
CAST PADDING 3X4FT ST 30246 (SOFTGOODS) ×2
COVER WAND RF STERILE (DRAPES) ×3 IMPLANT
DRSG DERMACEA 8X12 NADH (GAUZE/BANDAGES/DRESSINGS) ×3 IMPLANT
DURAPREP 26ML APPLICATOR (WOUND CARE) ×3 IMPLANT
ELECT REM PT RETURN 9FT ADLT (ELECTROSURGICAL) ×3
ELECTRODE REM PT RTRN 9FT ADLT (ELECTROSURGICAL) ×1 IMPLANT
GAUZE SPONGE 4X4 12PLY STRL (GAUZE/BANDAGES/DRESSINGS) ×3 IMPLANT
GLOVE BIOGEL M STRL SZ7.5 (GLOVE) ×3 IMPLANT
GOWN STRL REUS W/ TWL LRG LVL3 (GOWN DISPOSABLE) ×2 IMPLANT
GOWN STRL REUS W/TWL LRG LVL3 (GOWN DISPOSABLE) ×4
KIT TURNOVER CYSTO (KITS) ×3 IMPLANT
NS IRRIG 500ML POUR BTL (IV SOLUTION) ×3 IMPLANT
PACK EXTREMITY ARMC (MISCELLANEOUS) ×3 IMPLANT
PAD CAST CTTN 3X4 STRL (SOFTGOODS) ×1 IMPLANT
PAD PREP 24X41 OB/GYN DISP (PERSONAL CARE ITEMS) ×3 IMPLANT
STOCKINETTE STRL 4IN 9604848 (GAUZE/BANDAGES/DRESSINGS) ×3 IMPLANT
SUT ETHILON 4 0 P 3 18 (SUTURE) ×3 IMPLANT

## 2018-10-31 NOTE — Progress Notes (Signed)
[facility] [facility] at [place] NAME: Adrian Valdez    MR#:  [id number]  DATE OF BIRTH:  December 19, 1950  SUBJECTIVE:  CHIEF COMPLAINT: Patient is resting comfortably.  Family members at bedside.  Patient is currently n.p.o. want to know whether patient is getting surgery today or not.  REVIEW OF SYSTEMS:  CONSTITUTIONAL: No fever, fatigue or weakness.  EYES: No blurred or double vision.  EARS, NOSE, AND THROAT: No tinnitus or ear pain.  RESPIRATORY: No cough, shortness of breath, wheezing or hemoptysis.  CARDIOVASCULAR: No chest pain, orthopnea, edema.  GASTROINTESTINAL: No nausea, vomiting, diarrhea or abdominal pain.  GENITOURINARY: No dysuria, hematuria.  ENDOCRINE: No polyuria, nocturia,  HEMATOLOGY: No anemia, easy bruising or bleeding SKIN: Left hand pain and redness. MUSCULOSKELETAL: No joint pain or arthritis.   NEUROLOGIC: No tingling, numbness, weakness.  PSYCHIATRY: No anxiety or depression.   DRUG ALLERGIES:   Allergies  Allergen Reactions  . Oxybutynin Other (See Comments)    Headache  . Myrbetriq [Mirabegron] Other (See Comments)    Other reaction(s): Other (See Comments) migraine Hallucinations      VITALS:  Blood pressure (!) 156/91, pulse 65, temperature 97.6 F (36.4 C), temperature source Oral, resp. rate 16, height 6' (1.829 m), weight 95 kg, SpO2 100 %.  PHYSICAL EXAMINATION:  GENERAL:  68 y.o.-year-old patient lying in the bed with no acute distress.  EYES: Pupils equal, round, reactive to light and accommodation. No scleral icterus. Extraocular muscles intact.  HEENT: Head atraumatic, normocephalic. Oropharynx and nasopharynx clear.  NECK:  Supple, no jugular venous distention. No thyroid enlargement, no tenderness.  LUNGS: Normal breath sounds bilaterally, no wheezing, rales,rhonchi or crepitation. No use of accessory muscles of respiration.  CARDIOVASCULAR: S1, S2 normal. No murmurs, rubs, or gallops.  ABDOMEN:  Soft, nontender, nondistended. Bowel sounds present.  EXTREMITIES: Left hand -third finger with erythema and edema tender with paronychia ,clean dressing which was changed today a.m., no pedal edema, cyanosis, or clubbing.  NEUROLOGIC: Awake, alert and oriented x3. Sensation intact. Gait not checked.  PSYCHIATRIC: The patient is alert and oriented x 3.  SKIN: No obvious rash, lesion, or ulcer.    LABORATORY PANEL:   CBC Recent Labs  Lab 10/31/18 0301  WBC 6.2  HGB 11.6*  HCT 37.4*  PLT 163   ------------------------------------------------------------------------------------------------------------------  Chemistries  Recent Labs  Lab 10/30/18 1722  NA 138  K 3.3*  CL 104  CO2 25  GLUCOSE 92  BUN 21  CREATININE 0.95  CALCIUM 8.5*   ------------------------------------------------------------------------------------------------------------------  Cardiac Enzymes No results for input(s): TROPONINI in the last 168 hours. ------------------------------------------------------------------------------------------------------------------  RADIOLOGY:  Dg Hand Complete Left  Result Date: 10/30/2018 CLINICAL DATA:  Redness, swelling of middle finger. EXAM: LEFT HAND - COMPLETE 3+ VIEW COMPARISON:  None. FINDINGS: There is no evidence of fracture or dislocation. There is no evidence of arthropathy or other focal bone abnormality. Soft tissues are unremarkable. IMPRESSION: Negative. Electronically Signed   By: Rolm Baptise M.D.   On: 10/30/2018 17:55    EKG:   Orders placed or performed during the hospital encounter of 10/30/18  . ED EKG  . ED EKG    ASSESSMENT AND PLAN:   68 year old male with known history of dementia, diabetes, hypertension, neuropathy, history of Walden Strom's macroglobulinemia, glaucoma who presents to the hospital due to a left hand cellulitis.    1.  Left third finger paronychial infection with superimposed cellulitis  treat the patient with IV  vancomycin, follow clinically.  Vanco dosing by pharmacy   Patient is afebrile with a normal white cell count. Pain management as needed - Patient's x-ray shows no foreign body.   -Seen by orthopedics Dr. Marry Guan no plans for surgery today  2.  Dementia-continue Aricept, Namenda.  3.  Hypokalemia -replete and recheck in a.m.   4.  Diabetes type 2 without complication-continue glimepiride, sliding scale insulin.   5.  BPH-continue finasteride, Flomax.  6.  Neuropathy-continue gabapentin.  7.  Depression-continue Zoloft.  8. Essential HTN - cont. Lisinopril.       All the records are reviewed and case discussed with Care Management/Social Workerr. Management plans discussed with the patient, family and they are in agreement.  CODE STATUS: FC   TOTAL TIME TAKING CARE OF THIS PATIENT: 43minutes.   POSSIBLE D/C IN 2  DAYS, DEPENDING ON CLINICAL CONDITION.  Note: This dictation was prepared with Dragon dictation along with smaller phrase technology. Any transcriptional errors that result from this process are unintentional.   Nicholes Mango M.D on 10/31/2018 at 4:17 PM  Between 7am to 6pm - Pager - [phone] After 6pm go to [email or web address] - password [email or web address] [facility]  Office  [phone]  CC: Primary care physician; Merlene Laughter, MD

## 2018-10-31 NOTE — Consult Note (Signed)
ORTHOPAEDICS: The patient denies any significant pain.  Afebrile. Left hand dressing was changed this afternoon. Erythema and swelling to the forearm and dorsum of the hand have resolved. Still some erythema to the distal aspect of the left long finger, although swelling has significantly diminished. Good range of motion of the digit. Moderate amount of serous drainage to the dressing. Pulp to the distal phalanx is nontender with good capillary refill.  Labs: WBC - 6.2  Impression: Left long finger paronychial infection with cellulitis, improving  Plan: Will cancel surgery today. Continue with IV antibiotics. New bulky hand dressing applied. Will check progress in AM.  Laurice Record. Holley Bouche M.D.

## 2018-10-31 NOTE — Progress Notes (Signed)
Subjective :Patient is a 2 admission for cellulitis of left long finger Patient reports pain as being sore.   no nausea and no vomiting     Objective: Vital signs in last 24 hours: Temp:  [97.5 F (36.4 C)-98.4 F (36.9 C)] 97.6 F (36.4 C) (02/26 0802) Pulse Rate:  [55-69] 65 (02/26 0802) Resp:  [16-19] 16 (02/26 0802) BP: (91-156)/(56-91) 156/91 (02/26 0802) SpO2:  [95 %-100 %] 100 % (02/26 0802) Weight:  [95 kg-95.7 kg] 95 kg (02/25 2100) Wound check open, erythema, tender and minimal, serosanguinous, serous drainage no sensory deficits noted swelling and Appears that the blister have ruptured.  Skin still intact to the middle and distal phalanx.  But is a thin layer of skin from the blister  According to the markings on the dorsum aspect of the hand and forearm shows the redness that was going up the arm has significantly decreased.  Swelling has significantly improved as well.  Intake/Output from previous day: 02/25 0701 - 02/26 0700 In: 1500 [IV Piggyback:1500] Out: -  Intake/Output this shift: No intake/output data recorded.  Recent Labs    10/30/18 1722 10/31/18 0301  HGB 12.9* 11.6*   Recent Labs    10/30/18 1722 10/31/18 0301  WBC 9.7 6.2  RBC 4.26 3.90*  HCT 39.5 37.4*  PLT 230 163   Recent Labs    10/30/18 1722  NA 138  K 3.3*  CL 104  CO2 25  BUN 21  CREATININE 0.95  GLUCOSE 92  CALCIUM 8.5*   No results for input(s): LABPT, INR in the last 72 hours.  Neurologically intact Neurovascular intact Sensation intact distally  Assessment/Plan: Cellulitis left long finger with improvement Case management to assist with discharge planning At this time will continue n.p.o.  Dr. Marry Guan will evaluate the patient later with possibility of not doing surgery later today.  Hand was rewrapped on today's visit.  We will continue with antibiotics.   Jillyn Ledger [facility] 10/31/2018, 8:09 AM

## 2018-11-01 LAB — BASIC METABOLIC PANEL
ANION GAP: 6 (ref 5–15)
BUN: 21 mg/dL (ref 8–23)
CO2: 26 mmol/L (ref 22–32)
Calcium: 8.6 mg/dL — ABNORMAL LOW (ref 8.9–10.3)
Chloride: 109 mmol/L (ref 98–111)
Creatinine, Ser: 0.89 mg/dL (ref 0.61–1.24)
GFR calc Af Amer: >60 mL/min (ref >60)
GFR calc non Af Amer: >60 mL/min (ref >60)
Glucose, Bld: 98 mg/dL (ref 70–99)
Potassium: 3.6 mmol/L (ref 3.5–5.1)
Sodium: 141 mmol/L (ref 135–145)

## 2018-11-01 LAB — GLUCOSE, CAPILLARY
Glucose-Capillary: 82 mg/dL (ref 70–99)
Glucose-Capillary: 134 mg/dL — ABNORMAL HIGH (ref 70–99)
Glucose-Capillary: 110 mg/dL — ABNORMAL HIGH (ref 70–99)
Glucose-Capillary: 133 mg/dL — ABNORMAL HIGH (ref 70–99)

## 2018-11-01 LAB — CBC
HCT: 43.5 % (ref 39.0–52.0)
Hemoglobin: 13.5 g/dL (ref 13.0–17.0)
MCH: 29.5 pg (ref 26.0–34.0)
MCHC: 31 g/dL (ref 30.0–36.0)
MCV: 95.2 fL (ref 80.0–100.0)
NRBC: 0 % (ref 0.0–0.2)
PLATELETS: 212 10*3/uL (ref 150–400)
RBC: 4.57 MIL/uL (ref 4.22–5.81)
RDW: 13.1 % (ref 11.5–15.5)
WBC: 5.8 10*3/uL (ref 4.0–10.5)

## 2018-11-01 MED ORDER — SULFAMETHOXAZOLE-TRIMETHOPRIM 800-160 MG PO TABS
1.0000 | ORAL_TABLET | Freq: Two times a day (BID) | ORAL | Status: DC
  Administered 2018-11-02: 1 via ORAL
  Filled 2018-11-01: qty 1

## 2018-11-01 NOTE — Clinical Social Work Note (Signed)
Clinical Social Work Assessment  Patient Details  Name: Adrian Valdez MRN: [id number] Date of Birth: 01-14-1951  Date of referral:  11/01/18               Reason for consult:  Other (Comment Required)(From [facility] )                Permission sought to share information with:  Facility Art therapist granted to share information::  Yes, Verbal Permission Granted  Name::      [place]::     Relationship::     Contact Information:     Housing/Transportation Living arrangements for the past 2 months:  [place] of Information:  Power of Attorney, Facility Patient Interpreter Needed:  None Criminal Activity/Legal Involvement Pertinent to Current Situation/Hospitalization:  No - Comment as needed Significant Relationships:  Siblings Lives with:  Facility Resident Do you feel safe going back to the place where you live?  Yes Need for family participation in patient care:  Yes (Comment)  Care giving concerns:  Patient is a resident at [facility] (fax: [phone]).    Social Worker assessment / plan:  Holiday representative (CSW) reviewed chart and noted that patient is from [place]. Per chart patient is not alert and oriented. CSW contacted patient's father [name] who reported that patient's brother [name] is patient's HPOA. CSW contacted [name]. Per [name] he is agreeable for patient to return to [facility] and he will provide transport. Per [place] director patient has been at [facility] a few months, walks without an assistive device at baseline and is on room air at baseline. Per Tiffany patient can return to [facility] when stable. CSW will continue to follow and assist as needed.   Employment status:  Disabled (Comment on whether or not currently receiving Disability), Retired Forensic scientist:  Medicare PT Recommendations:  Not assessed at  this time Information / Referral to community resources:  Other (Comment Required)(Patient will return to [facility]. )  Patient/Family's Response to care:  Patient's brother/ HPOA is agreeable for patient to return to [facility].   Patient/Family's Understanding of and Emotional Response to Diagnosis, Current Treatment, and Prognosis:  Patient's brother [name] was very pleasant and thanked CSW for assistance.   Emotional Assessment Appearance:  Appears stated age Attitude/Demeanor/Rapport:  Unable to Assess Affect (typically observed):  Unable to Assess Orientation:  Oriented to Self, Fluctuating Orientation (Suspected and/or reported Sundowners) Alcohol / Substance use:  Not Applicable Psych involvement (Current and /or in the community):  No (Comment)  Discharge Needs  Concerns to be addressed:  Discharge Planning Concerns Readmission within the last 30 days:  No Current discharge risk:  Cognitively Impaired Barriers to Discharge:  Continued Medical Work up   [employer], [name], LCSW 11/01/2018, 5:05 PM

## 2018-11-01 NOTE — Care Management (Signed)
[facility] agency at [phone] to notify them that the patient will need go continue services already in place for wound care when DC, Caryl Pina stated that they will need new [place] orders to be faxed to [phone], she requested a call back once we know DC date.

## 2018-11-01 NOTE — Care Management (Signed)
Notified Dr. Margaretmary Eddy that we will need new [facility] orders to include PT and nursing when the patient is ready to DC.

## 2018-11-01 NOTE — Consult Note (Signed)
ORTHOPAEDICS PROGRESS NOTE  PATIENT NAME: Adrian Valdez DOB: Dec 29, 1950  MRN: [id number]  Subjective: The patient denies any significant pain to the finger or hand. Appetite is good. No fevers or chills.  Objective: Vital signs in last 24 hours: Temp:  [97.6 F (36.4 C)-97.7 F (36.5 C)] 97.7 F (36.5 C) (02/26 2308) Pulse Rate:  [53-65] 53 (02/26 2308) Resp:  [16-19] 19 (02/26 2308) BP: (126-156)/(73-91) 126/73 (02/26 2308) SpO2:  [95 %-100 %] 95 % (02/26 2308)  Intake/Output from previous day: No intake/output data recorded.  Recent Labs    10/30/18 1722 10/31/18 0301 11/01/18 0500  WBC 9.7 6.2 5.8  HGB 12.9* 11.6* 13.5  HCT 39.5 37.4* 43.5  PLT 230 163 212  K 3.3*  --  3.6  CL 104  --  109  CO2 25  --  26  BUN 21  --  21  CREATININE 0.95  --  0.89  GLUCOSE 92  --  98  CALCIUM 8.5*  --  8.6*    EXAM General: Well-developed well-nourished male seen in no apparent discomfort. Left hand: Moderate amount of serous drainage on the dressing.  Erythema has completely resolved to the dorsum of the hand and forearm.  Nail is intact to the long finger.  No gross tenderness to palpation to the distal phalanx.  The patient is able to flex and extend the digit comfortably.  Only localized swelling to the distal aspect of the long finger.  Assessment: Left long finger paronychial infection and cellulitis, improving  Plan: Orders were placed to begin dilute Betadine soaks every shift. Consider transitioning from IV antibiotic coverage to p.o. antibiotic coverage. I do not appreciate the need for surgical intervention at this time. If the patient is being considered for discharge, he will need follow-up in the office next week.  I would also recommend continuation of local wound care as above.  James P. Holley Bouche M.D.

## 2018-11-01 NOTE — Care Management (Signed)
Sarah from [facility] called and stated that she has Epic access and can get the orders, no need to print and fax the [facility] orders

## 2018-11-01 NOTE — NC FL2 (Signed)
[place] LEVEL OF CARE SCREENING TOOL     IDENTIFICATION  Patient Name: Adrian Valdez Birthdate: [birth year] Sex: male Admission Date (Current Location): 10/30/2018  [place] and [place] Number:  Engineering geologist and Address:  [facility], [address], [place],  [address]      Provider Number: [id number]  Attending Physician Name and Address:  Nicholes Mango, MD  Relative Name and Phone Number:       Current Level of Care: Hospital Recommended Level of Care: [place], Memory Care Prior Approval Number:    Date Approved/Denied:   PASRR Number:    Discharge Plan: Domiciliary (Rest home)(Memory Care )    Current Diagnoses: Primary: Dementia  Patient Active Problem List   Diagnosis Date Noted  . Cellulitis of left hand 10/30/2018  . Encounter for antineoplastic immunotherapy 11/20/2016  . Encounter for antineoplastic chemotherapy 10/30/2016  . Altered mental status 09/22/2016  . Waldenstrom's macroglobulinemia ([place]) 09/01/2016  . Enlarged prostate with urinary obstruction 08/16/2016  . Prostate cancer screening 07/19/2016  . Right Non-Complex Renal Cyst 07/19/2016  . Dysuria 11/30/2015  . Urgency of urination 11/30/2015  . Nocturia 11/30/2015  . 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 B12 deficiency 07/27/2015  . 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 Confusion state 07/07/2015  . BP (high blood pressure) 07/07/2015  . Major depressive disorder, single episode, moderate ([place]) 07/07/2015  . FOM (frequency of micturition) 07/07/2015    Orientation RESPIRATION BLADDER Height & Weight     Self  Normal Continent Weight: 209 lb 7 oz (95 kg) Height:  6' (182.9 cm)  BEHAVIORAL SYMPTOMS/MOOD NEUROLOGICAL BOWEL NUTRITION STATUS      Continent Diet(Diet: Regular )  AMBULATORY STATUS COMMUNICATION OF NEEDS Skin   Supervision Verbally Normal                        Personal Care Assistance Level of Assistance  Bathing, Feeding, Dressing Bathing Assistance: Limited assistance Feeding assistance: Independent Dressing Assistance: Limited assistance     Functional Limitations Info  Sight, Hearing, Speech Sight Info: Adequate Hearing Info: Adequate Speech Info: Adequate    SPECIAL CARE FACTORS FREQUENCY  PT (By licensed PT)     PT Frequency: (2-3 home health. )              Contractures      Additional Factors Info  Code Status, Allergies Code Status Info: (Full Code. ) Allergies Info: (Oxybutynin, Myrbetriq Mirabegron)           Current Medications (11/01/2018):  This is the current hospital active medication list Current Facility-Administered Medications  Medication Dose Route Frequency Provider Last Rate Last Dose  . acetaminophen (TYLENOL) tablet 650 mg  650 mg Oral Q6H PRN Henreitta Leber, MD   650 mg at 10/31/18 1759   Or  . acetaminophen (TYLENOL) suppository 650 mg  650 mg Rectal Q6H PRN Henreitta Leber, MD      . acetaZOLAMIDE (DIAMOX) 12 hr capsule 500 mg  500 mg Oral Q12H Henreitta Leber, MD   500 mg at 11/01/18 0920  . donepezil (ARICEPT) tablet 10 mg  10 mg Oral QHS Henreitta Leber, MD   10 mg at 10/31/18 2056  . enoxaparin (LOVENOX) injection 40 mg  40 mg Subcutaneous Q24H Henreitta Leber, MD   40 mg at 10/31/18 2056  . finasteride (PROSCAR) tablet 5 mg  5 mg Oral Daily  Henreitta Leber, MD   5 mg at 11/01/18 0919  . fluticasone (FLONASE) 50 MCG/ACT nasal spray 2 spray  2 spray Each Nare Daily Sainani, Vivek J, MD      . gabapentin (NEURONTIN) capsule 300 mg  300 mg Oral TID Henreitta Leber, MD   300 mg at 11/01/18 1609  . glimepiride (AMARYL) tablet 2 mg  2 mg Oral BID Henreitta Leber, MD   2 mg at 11/01/18 6063  . guaiFENesin (ROBITUSSIN) 100 MG/5ML solution 400 mg  400 mg Oral Q6H PRN Sainani, Belia Heman, MD      . insulin aspart (novoLOG) injection 0-5 Units  0-5 Units Subcutaneous QHS Sainani, Vivek J, MD       . insulin aspart (novoLOG) injection 0-9 Units  0-9 Units Subcutaneous TID WC Henreitta Leber, MD   1 Units at 11/01/18 1218  . latanoprost (XALATAN) 0.005 % ophthalmic solution 1 drop  1 drop Both Eyes QHS Henreitta Leber, MD   1 drop at 10/31/18 2300  . lisinopril (PRINIVIL,ZESTRIL) tablet 20 mg  20 mg Oral Daily Henreitta Leber, MD   20 mg at 11/01/18 0919  . meloxicam (MOBIC) tablet 15 mg  15 mg Oral Daily Henreitta Leber, MD   15 mg at 11/01/18 0920  . memantine (NAMENDA) tablet 10 mg  10 mg Oral BID Henreitta Leber, MD   10 mg at 11/01/18 0919  . mupirocin ointment (BACTROBAN) 2 % 1 application  1 application Nasal TID Henreitta Leber, MD   1 application at 01/60/10 1609  . ondansetron (ZOFRAN) tablet 4 mg  4 mg Oral Q6H PRN Henreitta Leber, MD       Or  . ondansetron (ZOFRAN) injection 4 mg  4 mg Intravenous Q6H PRN Henreitta Leber, MD      . QUEtiapine (SEROQUEL) tablet 25 mg  25 mg Oral TID Henreitta Leber, MD   25 mg at 11/01/18 1609  . sertraline (ZOLOFT) tablet 50 mg  50 mg Oral Daily Henreitta Leber, MD   50 mg at 11/01/18 0919  . [START ON 11/02/2018] sulfamethoxazole-trimethoprim (BACTRIM DS,SEPTRA DS) 800-160 MG per tablet 1 tablet  1 tablet Oral Q12H Gouru, Aruna, MD      . tamsulosin (FLOMAX) capsule 0.4 mg  0.4 mg Oral QPC supper Henreitta Leber, MD   0.4 mg at 10/31/18 1758  . timolol (TIMOPTIC) 0.5 % ophthalmic solution 1 drop  1 drop Both Eyes BID Henreitta Leber, MD   1 drop at 11/01/18 0921  . vancomycin (VANCOCIN) 1,750 mg in sodium chloride 0.9 % 500 mL IVPB  1,750 mg Intravenous Q24H Gouru, Aruna, MD 250 mL/hr at 10/31/18 1804 1,750 mg at 10/31/18 1804     Discharge Medications: Please see discharge summary for a list of discharge medications.  Relevant Imaging Results:  Relevant Lab Results:   Additional Information (SSN: [id number])  Kendrik Mcshan, [name], LCSW

## 2018-11-01 NOTE — Progress Notes (Signed)
[place] [facility] [place] NAME: Adrian Valdez    MR#:  [id number]  DATE OF BIRTH:  03/02/1951  SUBJECTIVE:  CHIEF COMPLAINT: Patient is resting comfortably.  Redness, swelling and pain is getting better of left hand REVIEW OF SYSTEMS:  CONSTITUTIONAL: No fever, fatigue or weakness.  EYES: No blurred or double vision.  EARS, NOSE, AND THROAT: No tinnitus or ear pain.  RESPIRATORY: No cough, shortness of breath, wheezing or hemoptysis.  CARDIOVASCULAR: No chest pain, orthopnea, edema.  GASTROINTESTINAL: No nausea, vomiting, diarrhea or abdominal pain.  GENITOURINARY: No dysuria, hematuria.  ENDOCRINE: No polyuria, nocturia,  HEMATOLOGY: No anemia, easy bruising or bleeding SKIN: Left hand pain and redness improving MUSCULOSKELETAL: No joint pain or arthritis.   NEUROLOGIC: No tingling, numbness, weakness.  PSYCHIATRY: No anxiety or depression.   DRUG ALLERGIES:   Allergies  Allergen Reactions  . Oxybutynin Other (See Comments)    Headache  . Myrbetriq [Mirabegron] Other (See Comments)    Other reaction(s): Other (See Comments) migraine Hallucinations      VITALS:  Blood pressure (!) 151/89, pulse (!) 54, temperature 98 F (36.7 C), temperature source Oral, resp. rate 18, height 6' (1.829 m), weight 95 kg, SpO2 100 %.  PHYSICAL EXAMINATION:  GENERAL:  69 y.o.-year-old patient lying in the bed with no acute distress.  EYES: Pupils equal, round, reactive to light and accommodation. No scleral icterus. Extraocular muscles intact.  HEENT: Head atraumatic, normocephalic. Oropharynx and nasopharynx clear.  NECK:  Supple, no jugular venous distention. No thyroid enlargement, no tenderness.  LUNGS: Normal breath sounds bilaterally, no wheezing, rales,rhonchi or crepitation. No use of accessory muscles of respiration.  CARDIOVASCULAR: S1, S2 normal. No murmurs, rubs, or gallops.  ABDOMEN: Soft, nontender, nondistended. Bowel sounds  present.  EXTREMITIES: Left hand -third finger with significantly decreased erythema and edema, minimal tenderness  with paronychia ,clean dressing which was changed today a.m., no pedal edema, cyanosis, or clubbing.  NEUROLOGIC: Awake, alert and oriented x3. Sensation intact. Gait not checked.  PSYCHIATRIC: The patient is alert and oriented x 3.  SKIN: No obvious rash, lesion, or ulcer.    LABORATORY PANEL:   CBC Recent Labs  Lab 11/01/18 0500  WBC 5.8  HGB 13.5  HCT 43.5  PLT 212   ------------------------------------------------------------------------------------------------------------------  Chemistries  Recent Labs  Lab 11/01/18 0500  NA 141  K 3.6  CL 109  CO2 26  GLUCOSE 98  BUN 21  CREATININE 0.89  CALCIUM 8.6*   ------------------------------------------------------------------------------------------------------------------  Cardiac Enzymes No results for input(s): TROPONINI in the last 168 hours. ------------------------------------------------------------------------------------------------------------------  RADIOLOGY:  Dg Hand Complete Left  Result Date: 10/30/2018 CLINICAL DATA:  Redness, swelling of middle finger. EXAM: LEFT HAND - COMPLETE 3+ VIEW COMPARISON:  None. FINDINGS: There is no evidence of fracture or dislocation. There is no evidence of arthropathy or other focal bone abnormality. Soft tissues are unremarkable. IMPRESSION: Negative. Electronically Signed   By: Rolm Baptise M.D.   On: 10/30/2018 17:55    EKG:   Orders placed or performed during the hospital encounter of 10/30/18  . ED EKG  . ED EKG    ASSESSMENT AND PLAN:   68 year old male with known history of dementia, diabetes, hypertension, neuropathy, history of Walden Strom's macroglobulinemia, glaucoma who presents to the hospital due to a left hand cellulitis.    1.  Left third finger paronychial infection with superimposed cellulitis Clinically improving Continue to  treat the patient with IV vancomycin, follow  clinically.  Vanco dosing by pharmacy.  We will try to change the antibiotic to p.o. Bactrim and discharge patient if clinically stable   Patient is afebrile with a normal white cell count. Pain management as needed - Patient's x-ray shows no foreign body.   -Seen by orthopedics Dr. Marry Guan no plans for surgery as patient is clinically improving  2.  Dementia-continue Aricept, Namenda.  3.  Hypokalemia -repleted potassium at 3.6  4.  Diabetes type 2 without complication-continue glimepiride, sliding scale insulin.   5.  BPH-continue finasteride, Flomax.  6.  Neuropathy-continue gabapentin.  7.  Depression-continue Zoloft.  8. Essential HTN - cont. Lisinopril.       All the records are reviewed and case discussed with Care Management/Social Workerr. Management plans discussed with the patient, family and they are in agreement.  CODE STATUS: FC   TOTAL TIME TAKING CARE OF THIS PATIENT: 83minutes.   POSSIBLE D/C IN 1 DAYS, DEPENDING ON CLINICAL CONDITION.  Note: This dictation was prepared with Dragon dictation along with smaller phrase technology. Any transcriptional errors that result from this process are unintentional.   Nicholes Mango M.D on 11/01/2018 at 4:39 PM  Between 7am to 6pm - Pager - [phone] After 6pm go to [email or web address] - password [email or web address] [place] [facility]  Office  [phone]  CC: Primary care physician; Merlene Laughter, MD

## 2018-11-02 LAB — GLUCOSE, CAPILLARY
Glucose-Capillary: 90 mg/dL (ref 70–99)
Glucose-Capillary: 108 mg/dL — ABNORMAL HIGH (ref 70–99)

## 2018-11-02 MED ORDER — SULFAMETHOXAZOLE-TRIMETHOPRIM 800-160 MG PO TABS: 1.0000 | ORAL_TABLET | Freq: Two times a day (BID) | ORAL | 0 refills | Status: AC

## 2018-11-02 MED ORDER — ONDANSETRON HCL 4 MG PO TABS: 4.0000 mg | ORAL_TABLET | Freq: Four times a day (QID) | ORAL | 0 refills | Status: DC | PRN

## 2018-11-02 NOTE — NC FL2 (Signed)
[place] LEVEL OF CARE SCREENING TOOL     IDENTIFICATION  Patient Name: Adrian Valdez Birthdate: 03/01/1951 Sex: male Admission Date (Current Location): 10/30/2018  [place] and [place] Number:  Engineering geologist and Address:  [facility], [address], [place], [place] [address]      Provider Number: [id number]  Attending Physician Name and Address:  Nicholes Mango, MD  Relative Name and Phone Number:       Current Level of Care: Hospital Recommended Level of Care: [place], Memory Care Prior Approval Number:    Date Approved/Denied:   PASRR Number:    Discharge Plan: Domiciliary (Rest home)(Memory Care )    Current Diagnoses: Primary: Dementia  Patient Active Problem List   Diagnosis Date Noted  . Cellulitis of left hand 10/30/2018  . Encounter for antineoplastic immunotherapy 11/20/2016  . Encounter for antineoplastic chemotherapy 10/30/2016  . Altered mental status 09/22/2016  . Waldenstrom's macroglobulinemia ([place]) 09/01/2016  . Enlarged prostate with urinary obstruction 08/16/2016  . Prostate cancer screening 07/19/2016  . Right Non-Complex Renal Cyst 07/19/2016  . Dysuria 11/30/2015  . Urgency of urination 11/30/2015  . Nocturia 11/30/2015  . Ascending aorta enlargement ([place]) 08/18/2015  . B12 deficiency 07/27/2015  . 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 Confusion state 07/07/2015  . BP (high blood pressure) 07/07/2015  . Major depressive disorder, single episode, moderate ([place]) 07/07/2015  . FOM (frequency of micturition) 07/07/2015    Orientation RESPIRATION BLADDER Height & Weight     Self  Normal Continent Weight: 209 lb 7 oz (95 kg) Height:  6' (182.9 cm)  BEHAVIORAL SYMPTOMS/MOOD NEUROLOGICAL BOWEL NUTRITION STATUS      Continent Diet(Diet: Regular )  AMBULATORY STATUS COMMUNICATION OF NEEDS Skin   Supervision Verbally Normal                        Personal Care Assistance Level of Assistance  Bathing, Feeding, Dressing Bathing Assistance: Limited assistance Feeding assistance: Independent Dressing Assistance: Limited assistance     Functional Limitations Info  Sight, Hearing, Speech Sight Info: Adequate Hearing Info: Adequate Speech Info: Adequate    SPECIAL CARE FACTORS FREQUENCY  PT (By licensed PT)     PT Frequency: (2-3 home health. )              Contractures      Additional Factors Info  Code Status, Allergies Code Status Info: (Full Code. ) Allergies Info: (Oxybutynin, Myrbetriq Mirabegron)          Discharge Medications: Please see discharge summary for a list of discharge medications. Medication List    TAKE these medications   acetaminophen 650 MG CR tablet Commonly known as:  TYLENOL Take 650 mg by mouth every 6 (six) hours as needed for pain.   diclofenac sodium 1 % Gel Commonly known as:  VOLTAREN Apply 2 g topically every 4 (four) hours as needed for pain.   donepezil 10 MG tablet Commonly known as:  ARICEPT Take 10 mg by mouth at bedtime.   finasteride 5 MG tablet Commonly known as:  PROSCAR Take 1 tablet (5 mg total) by mouth daily. Reported on 11/30/2015   fluticasone 50 MCG/ACT nasal spray Commonly known as:  FLONASE Place 2 sprays into both nostrils daily.   gabapentin 300 MG capsule Commonly known as:  NEURONTIN Take 300 mg by mouth 3 (three) times daily.   glimepiride 2 MG tablet Commonly known  as:  AMARYL Take 2 mg by mouth 2 (two) times daily.   guaifenesin 400 MG Tabs tablet Commonly known as:  HUMIBID E Take 400 mg by mouth every 6 (six) hours as needed (cough/congestion).   lisinopril 20 MG tablet Commonly known as:  PRINIVIL,ZESTRIL Take 20 mg by mouth daily. Reported on 11/30/2015   meloxicam 15 MG tablet Commonly known as:  MOBIC Take 15 mg by mouth daily.   memantine 10 MG tablet Commonly known as:  NAMENDA Take 10 mg by mouth 2 (two)  times daily.   methazolamide 50 MG tablet Commonly known as:  NEPTAZANE Take 50 mg by mouth daily.   mupirocin ointment 2 % Commonly known as:  BACTROBAN Place 1 application into the nose 3 (three) times daily.   ondansetron 4 MG tablet Commonly known as:  ZOFRAN Take 1 tablet (4 mg total) by mouth every 6 (six) hours as needed for nausea.   QUEtiapine 25 MG tablet Commonly known as:  SEROQUEL Take 25 mg by mouth 3 (three) times daily.   sertraline 50 MG tablet Commonly known as:  ZOLOFT Take 50 mg by mouth daily.   sulfamethoxazole-trimethoprim 800-160 MG tablet Commonly known as:  BACTRIM DS,SEPTRA DS Take 1 tablet by mouth every 12 (twelve) hours for 7 days.   tamsulosin 0.4 MG Caps capsule Commonly known as:  FLOMAX TAKE ONE CAPSULE BY MOUTH ONCE DAILY, 30 MINUTES AFTER THE SAME MEAL EACH DAY   timolol 0.5 % ophthalmic solution Commonly known as:  BETIMOL Place 1 drop into both eyes 2 (two) times daily. Reported on 11/30/2015   Travoprost (BAK Free) 0.004 % Soln ophthalmic solution Commonly known as:  TRAVATAN Place 1 drop into both eyes at bedtime.   triamcinolone cream 0.1 % Commonly known as:  KENALOG Apply 1 application topically 2 (two) times daily.   vitamin B-12 1000 MCG tablet Commonly known as:  CYANOCOBALAMIN Take 1,000 mcg by mouth. One time every month on the 8th of every month     Relevant Imaging Results: Relevant Lab Results: Additional Information (SSN: [id number])  Adrian Valdez, [name], LCSW

## 2018-11-02 NOTE — Care Management (Signed)
Secure Emailed Dr. Ezequiel Kayser with [facility] requesting a Home health order for Nursing for Wound care.  The patient has already discharged back to [facility] and the Home health order for Nursing was not in the discharge orders.  Adrian Valdez with [facility] health has requested the home health orderInstructions , requested the order to be faxed to Adrian Valdez with [facility] at [phone]  Follow-up with primary care physician in 3 days Follow up with Dr. Marry Guan in 5 to 7 days Recommend continuing with Betadine/normal saline soaks to thelefthand tid upon discharge- ortho,

## 2018-11-02 NOTE — Consult Note (Signed)
ORTHOPAEDICS PROGRESS NOTE  PATIENT NAME: Adrian Valdez DOB: Mar 31, 1951  MRN: [id number]  Subjective: The patient denies any significant pain to the finger or hand. No fevers or chills.  Objective: Vital signs in last 24 hours: Temp:  [98 F (36.7 C)] 98 F (36.7 C) (02/27 2326) Pulse Rate:  [49-54] 49 (02/27 2326) Resp:  [18] 18 (02/27 2326) BP: (119-171)/(86-89) 119/86 (02/27 2326) SpO2:  [98 %-100 %] 98 % (02/27 2326)  Intake/Output from previous day: 02/27 0701 - 02/28 0700 In: 685 [IV Piggyback:685] Out: -   Recent Labs    10/30/18 1722 10/31/18 0301 11/01/18 0500  WBC 9.7 6.2 5.8  HGB 12.9* 11.6* 13.5  HCT 39.5 37.4* 43.5  PLT 230 163 212  K 3.3*  --  3.6  CL 104  --  109  CO2 25  --  26  BUN 21  --  21  CREATININE 0.95  --  0.89  GLUCOSE 92  --  98  CALCIUM 8.5*  --  8.6*    EXAM General: Well-developed well-nourished male seen in no apparent discomfort. Left hand: Small amount of serous drainage on the dressing.  No erythema to wrist or forearm..  No gross tenderness to palpation to the distal phalanx.  The patient is able to flex and extend the digit comfortably.  Only mild localized swelling to the distal aspect of the long finger.  Assessment: Left long finger paronychial infection and cellulitis, improving  Plan: Continues to improve. Recommend continuing with Betadine/normal saline soaks to the left hand tid upon discharge. OK to convert to oral antibiotics. Will need f/u appointment early next week.  James P. Holley Bouche M.D.

## 2018-11-02 NOTE — Discharge Summary (Signed)
[facility] [place] NAME: Adrian Valdez    MR#:  [id number]  DATE OF BIRTH:  May 01, 1951  DATE OF ADMISSION:  10/30/2018 ADMITTING PHYSICIAN: Henreitta Leber, MD  DATE OF DISCHARGE:  11/02/18   PRIMARY CARE PHYSICIAN: Merlene Laughter, MD    ADMISSION DIAGNOSIS:  Cellulitis of left hand [L03.114] Hypotension, unspecified hypotension type [I95.9] Paronychia of left middle finger [L03.012]  DISCHARGE DIAGNOSIS:  Active Problems:   Cellulitis of left hand  Paronychia of the left middle finger SECONDARY DIAGNOSIS:   Past Medical History:  Diagnosis Date  . 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 B12 deficiency 07/27/2015  . Dementia ([place])   . Diabetes mellitus, type 2 ([place])   . Enlarged prostate   . Glaucoma   . Glaucoma   . Hypertension   . Neuropathy    feet and legs  . Urinary frequency   . Vitamin B 12 deficiency   . Waldenstrom's macroglobulinemia [facility])     HOSPITAL COURSE:   68 year old male withknown history ofdementia, diabetes, hypertension, neuropathy, history of Walden Strom's macroglobulinemia, glaucoma who presents to the hospital due to a left hand cellulitis.  1.Left third finger paronychial infection with superimposed cellulitis Clinically improving with IV vancomycin.  Discontinue vancomycin and discharged home with p.o. Bactrim.  Dr. Marry Guan is agreeable, not considering any procedures  Patient is afebrile with a normal white cell count. Pain management as needed - Patient's x-ray shows no foreign body.  -Outpatient follow-up with orthopedics Dr. Marry Guan in a week.  Appreciate Dr. Beatris Ship recommendations. Recommend continuing with Betadine/normal saline soaks to the left hand tid upon discharge.  2. Dementia-continue Aricept, Namenda.  3.  Hypokalemia -repleted potassium at 3.6  4. Diabetes type 2 without complication-continue glimepiride, sliding scale insulin.   5.  BPH-continue finasteride, Flomax.  6. Neuropathy-continue gabapentin.  7. Depression-continue Zoloft.  8. Essential HTN - cont. Lisinopril.   Discharged to [facility]: [name] memory care unit  DISCHARGE CONDITIONS:   Stable  CONSULTS OBTAINED:  Treatment Team:  Dereck Leep, MD   PROCEDURES none  DRUG ALLERGIES:   Allergies  Allergen Reactions  . Oxybutynin Other (See Comments)    Headache  . Myrbetriq [Mirabegron] Other (See Comments)    Other reaction(s): Other (See Comments) migraine Hallucinations      DISCHARGE MEDICATIONS:   Allergies as of 11/02/2018      Reactions   Oxybutynin Other (See Comments)   Headache   Myrbetriq [mirabegron] Other (See Comments)   Other reaction(s): Other (See Comments) migraine Hallucinations       Medication List    TAKE these medications   acetaminophen 650 MG CR tablet Commonly known as:  TYLENOL Take 650 mg by mouth every 6 (six) hours as needed for pain.   diclofenac sodium 1 % Gel Commonly known as:  VOLTAREN Apply 2 g topically every 4 (four) hours as needed for pain.   donepezil 10 MG tablet Commonly known as:  ARICEPT Take 10 mg by mouth at bedtime.   finasteride 5 MG tablet Commonly known as:  PROSCAR Take 1 tablet (5 mg total) by mouth daily. Reported on 11/30/2015   fluticasone 50 MCG/ACT nasal spray Commonly known as:  FLONASE Place 2 sprays into both nostrils daily.   gabapentin 300 MG capsule Commonly known as:  NEURONTIN Take 300 mg by mouth 3 (three) times daily.   glimepiride 2 MG tablet Commonly known as:  AMARYL Take 2 mg by  mouth 2 (two) times daily.   guaifenesin 400 MG Tabs tablet Commonly known as:  HUMIBID E Take 400 mg by mouth every 6 (six) hours as needed (cough/congestion).   lisinopril 20 MG tablet Commonly known as:  PRINIVIL,ZESTRIL Take 20 mg by mouth daily. Reported on 11/30/2015   meloxicam 15 MG tablet Commonly known as:  MOBIC Take 15 mg by mouth daily.    memantine 10 MG tablet Commonly known as:  NAMENDA Take 10 mg by mouth 2 (two) times daily.   methazolamide 50 MG tablet Commonly known as:  NEPTAZANE Take 50 mg by mouth daily.   mupirocin ointment 2 % Commonly known as:  BACTROBAN Place 1 application into the nose 3 (three) times daily.   ondansetron 4 MG tablet Commonly known as:  ZOFRAN Take 1 tablet (4 mg total) by mouth every 6 (six) hours as needed for nausea.   QUEtiapine 25 MG tablet Commonly known as:  SEROQUEL Take 25 mg by mouth 3 (three) times daily.   sertraline 50 MG tablet Commonly known as:  ZOLOFT Take 50 mg by mouth daily.   sulfamethoxazole-trimethoprim 800-160 MG tablet Commonly known as:  BACTRIM DS,SEPTRA DS Take 1 tablet by mouth every 12 (twelve) hours for 7 days.   tamsulosin 0.4 MG Caps capsule Commonly known as:  FLOMAX TAKE ONE CAPSULE BY MOUTH ONCE DAILY, 30 MINUTES AFTER THE SAME MEAL EACH DAY   timolol 0.5 % ophthalmic solution Commonly known as:  BETIMOL Place 1 drop into both eyes 2 (two) times daily. Reported on 11/30/2015   Travoprost (BAK Free) 0.004 % Soln ophthalmic solution Commonly known as:  TRAVATAN Place 1 drop into both eyes at bedtime.   triamcinolone cream 0.1 % Commonly known as:  KENALOG Apply 1 application topically 2 (two) times daily.   vitamin B-12 1000 MCG tablet Commonly known as:  CYANOCOBALAMIN Take 1,000 mcg by mouth. One time every month on the 8th of every month        DISCHARGE INSTRUCTIONS:  Follow-up with primary care physician in 3 days Follow up with Dr. Marry Guan in 5 to 7 days Recommend continuing with Betadine/normal saline soaks to the left hand tid upon discharge- ortho DIET:  Diabetic diet  DISCHARGE CONDITION:  Fair  ACTIVITY:  Activity as tolerated  OXYGEN:  Home Oxygen: No.   Oxygen Delivery: room air  DISCHARGE LOCATION:  [facility] memory care unit  If you experience worsening of your admission symptoms, develop shortness  of breath, life threatening emergency, suicidal or homicidal thoughts you must seek medical attention immediately by calling 911 or calling your MD immediately  if symptoms less severe.  You Must read complete instructions/literature along with all the possible adverse reactions/side effects for all the Medicines you take and that have been prescribed to you. Take any new Medicines after you have completely understood and accpet all the possible adverse reactions/side effects.   Please note  You were cared for by a hospitalist during your hospital stay. If you have any questions about your discharge medications or the care you received while you were in the hospital after you are discharged, you can call the unit and asked to speak with the hospitalist on call if the hospitalist that took care of you is not available. Once you are discharged, your primary care physician will handle any further medical issues. Please note that NO REFILLS for any discharge medications will be authorized once you are discharged, as it is imperative that you return to  your primary care physician (or establish a relationship with a primary care physician if you do not have one) for your aftercare needs so that they can reassess your need for medications and monitor your lab values.     Today  Chief Complaint  Patient presents with  . Cellulitis   Patient is doing much better.  Denies any pain  ROS: Limited from baseline dementia CONSTITUTIONAL: Denies fevers, chills. Denies any fatigue, weakness.  EYES: Denies blurry vision, double vision, eye pain. EARS, NOSE, THROAT: Denies tinnitus, ear pain, hearing loss. RESPIRATORY: Denies cough, wheeze, shortness of breath.  CARDIOVASCULAR: Denies chest pain, palpitations, edema.  GASTROINTESTINAL: Denies nausea, vomiting, diarrhea, abdominal pain. Denies bright red blood per rectum.Marland Kitchen SKIN: Left hand pain and swelling are better MUSCULOSKELETAL: Denies pain in neck, back,  shoulder, knees, hips or arthritic symptoms.     VITAL SIGNS:  Blood pressure (!) 152/87, pulse (!) 50, temperature 97.7 F (36.5 C), temperature source Oral, resp. rate 18, height 6' (1.829 m), weight 95 kg, SpO2 100 %.  I/O:    Intake/Output Summary (Last 24 hours) at 11/02/2018 1116 Last data filed at 11/01/2018 1832 Gross per 24 hour  Intake 685.04 ml  Output -  Net 685.04 ml    PHYSICAL EXAMINATION:  GENERAL:  68 y.o.-year-old patient lying in the bed with no acute distress.  EYES: Pupils equal, round, reactive to light and accommodation. No scleral icterus. Extraocular muscles intact.  HEENT: Head atraumatic, normocephalic. Oropharynx and nasopharynx clear.  NECK:  Supple, no jugular venous distention. No thyroid enlargement, no tenderness.  LUNGS: Normal breath sounds bilaterally, no wheezing, rales,rhonchi or crepitation. No use of accessory muscles of respiration.  CARDIOVASCULAR: S1, S2 normal. No murmurs, rubs, or gallops.  ABDOMEN: Soft, non-tender, non-distended. Bowel sounds present. EXTREMITIES: No pedal edema, cyanosis, or clubbing.  NEUROLOGIC: Awake alert and oriented x1 s. Sensation intact. Gait not checked.  PSYCHIATRIC: The patient is alert and oriented x 1  SKIN: Left hand -third finger with significantly de at creased erythema and edema, minimal tenderness  with paronychia , dressing which was changed today , no pedal edema, cyanosis, or clubbing. No obvious rash, lesion  DATA REVIEW:   CBC Recent Labs  Lab 11/01/18 0500  WBC 5.8  HGB 13.5  HCT 43.5  PLT 212    Chemistries  Recent Labs  Lab 11/01/18 0500  NA 141  K 3.6  CL 109  CO2 26  GLUCOSE 98  BUN 21  CREATININE 0.89  CALCIUM 8.6*    Cardiac Enzymes No results for input(s): TROPONINI in the last 168 hours.  Microbiology Results  Results for orders placed or performed during the hospital encounter of 10/30/18  Blood Culture (routine x 2)     Status: None (Preliminary result)    Collection Time: 10/30/18  5:22 PM  Result Value Ref Range Status   Specimen Description BLOOD BLOOD RIGHT ARM  Final   Special Requests   Final    BOTTLES DRAWN AEROBIC AND ANAEROBIC Blood Culture adequate volume   Culture   Final    NO GROWTH 3 DAYS Performed at [facility], [address] [place],  [address]    Report Status PENDING  Incomplete  Blood Culture (routine x 2)     Status: None (Preliminary result)   Collection Time: 10/30/18  5:31 PM  Result Value Ref Range Status   Specimen Description BLOOD LEFT ANTECUBITAL  Final   Special Requests   Final  BOTTLES DRAWN AEROBIC AND ANAEROBIC Blood Culture adequate volume   Culture   Final    NO GROWTH 3 DAYS Performed at [facility], [address] [place], [place] [address]    Report Status PENDING  Incomplete  MRSA PCR Screening     Status: Abnormal   Collection Time: 10/30/18  9:30 PM  Result Value Ref Range Status   MRSA by PCR POSITIVE (A) NEGATIVE Final    Comment:        The GeneXpert MRSA Assay (FDA approved for NASAL specimens only), is one component of a comprehensive MRSA colonization surveillance program. It is not intended to diagnose MRSA infection nor to guide or monitor treatment for MRSA infections. CRITICAL RESULT CALLED TO, READ BACK BY AND VERIFIED WITH: CYNTHIA SNIPES @2304  10/30/2018 TTG Performed at [facility], [address] [place], [place] [address]     RADIOLOGY:  Dg Hand Complete Left  Result Date: 10/30/2018 CLINICAL DATA:  Redness, swelling of middle finger. EXAM: LEFT HAND - COMPLETE 3+ VIEW COMPARISON:  None. FINDINGS: There is no evidence of fracture or dislocation. There is no evidence of arthropathy or other focal bone abnormality. Soft tissues are unremarkable. IMPRESSION: Negative. Electronically Signed   By: Rolm Baptise M.D.   On: 10/30/2018 17:55    EKG:   Orders placed or performed during the hospital encounter of 10/30/18  . ED  EKG  . ED EKG      Management plans discussed with the patient, family and they are in agreement.  CODE STATUS:     Code Status Orders  (From admission, onward)         Start     Ordered   10/30/18 2054  Full code  Continuous     10/30/18 2053        Code Status History    This patient has a current code status but no historical code status.      TOTAL TIME TAKING CARE OF THIS PATIENT: 43  minutes.   Note: This dictation was prepared with Dragon dictation along with smaller phrase technology. Any transcriptional errors that result from this process are unintentional.   @[facility] @  on 11/02/2018 at 11:16 AM  Between 7am to 6pm - Pager - [phone]  After 6pm go to [email or web address] - password [email or web address] [facility]  Office  [phone]  CC: Primary care physician; Merlene Laughter, MD

## 2018-11-02 NOTE — Discharge Instructions (Signed)
Follow-up with primary care physician in 3 days Follow up with Dr. Marry Guan in 5 to 7 days Recommend continuing with Betadine/normal saline soaks to the left hand tid upon discharge- ortho

## 2018-11-02 NOTE — Progress Notes (Signed)
Patient is medically stable for D/C back to [facility] today. Per [facility] director they can accept patient back today. RN will call report and patient's sister Juliann Pulse will transport patient. Clinical Education officer, museum (CSW) sent D/C summary and FL2 to [employer]. CSW contacted patient's brother/ HPOA [name] and made him aware of above. Please reconsult if future social work needs arise. CSW signing off.   [employer], LCSW [phone]

## 2018-11-04 LAB — CULTURE, BLOOD (ROUTINE X 2)
Culture: NO GROWTH
Culture: NO GROWTH
Special Requests: ADEQUATE
Special Requests: ADEQUATE

## 2018-11-19 ENCOUNTER — Inpatient Hospital Stay: Payer: Medicare Other

## 2018-11-19 ENCOUNTER — Inpatient Hospital Stay: Payer: Medicare Other | Attending: Internal Medicine

## 2018-11-19 ENCOUNTER — Ambulatory Visit: Payer: BLUE CROSS/BLUE SHIELD | Admitting: Hematology and Oncology

## 2018-11-19 ENCOUNTER — Inpatient Hospital Stay: Payer: Medicare Other | Admitting: Internal Medicine

## 2018-11-19 ENCOUNTER — Ambulatory Visit: Payer: BLUE CROSS/BLUE SHIELD

## 2018-11-19 ENCOUNTER — Other Ambulatory Visit: Payer: BLUE CROSS/BLUE SHIELD

## 2018-11-19 NOTE — Progress Notes (Deleted)
[place] NOTE  Patient Care Team: Merlene Laughter, MD as PCP - General (Internal Medicine) Vladimir Crofts, MD as Consulting Physician (Neurology)  CHIEF COMPLAINTS/PURPOSE OF CONSULTATION:  ***  #   No history exists.     HISTORY OF PRESENTING ILLNESS:  Adrian Valdez 68 y.o.  male    ROS   MEDICAL HISTORY:  Past Medical History:  Diagnosis Date  . 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 B12 deficiency 07/27/2015  . Dementia ([place])   . Diabetes mellitus, type 2 ([place])   . Enlarged prostate   . Glaucoma   . Glaucoma   . Hypertension   . Neuropathy    feet and legs  . Urinary frequency   . Vitamin B 12 deficiency   . Waldenstrom's macroglobulinemia ([place])     SURGICAL HISTORY: Past Surgical History:  Procedure Laterality Date  . CATARACT EXTRACTION W/ INTRAOCULAR LENS IMPLANT    . COLONOSCOPY    . KNEE ARTHROSCOPY Right   . LASIK    . PHOTOCOAGULATION WITH LASER Right 05/24/2017   Procedure: PHOTOCOAGULATION WITH LASER;  Surgeon: Leandrew Koyanagi, MD;  Location: [facility];  Service: Ophthalmology;  Laterality: Right;  Diabetic - oral meds    SOCIAL HISTORY: Social History   Socioeconomic History  . Marital status: Single    Spouse name: Not on file  . Number of children: Not on file  . Years of education: Not on file  . Highest education level: Not on file  Occupational History  . Not on file  Social Needs  . Financial resource strain: Not on file  . Food insecurity:    Worry: Not on file    Inability: Not on file  . Transportation needs:    Medical: Not on file    Non-medical: Not on file  Tobacco Use  . Smoking status: Never Smoker  . Smokeless tobacco: Never Used  Substance and Sexual Activity  . Alcohol use: Yes    Comment: social drinker wine couple of times a year  . Drug use: No  . Sexual activity: Not on file  Lifestyle  . Physical activity:    Days per week: Not on file    Minutes  per session: Not on file  . Stress: Not on file  Relationships  . Social connections:    Talks on phone: Not on file    Gets together: Not on file    Attends religious service: Not on file    Active member of club or organization: Not on file    Attends meetings of clubs or organizations: Not on file    Relationship status: Not on file  . Intimate partner violence:    Fear of current or ex partner: Not on file    Emotionally abused: Not on file    Physically abused: Not on file    Forced sexual activity: Not on file  Other Topics Concern  . Not on file  Social History Narrative  . Not on file    FAMILY HISTORY: Family History  Problem Relation Age of Onset  . Hypertension Mother   . Hypertension Father   . CAD Father   . Breast cancer Maternal Grandmother   . Ovarian cancer Paternal Grandmother   . Kidney disease Neg Hx   . Prostate cancer Neg Hx     ALLERGIES:  is allergic to oxybutynin and myrbetriq [mirabegron].  MEDICATIONS:  Current Outpatient Medications  Medication Sig Dispense Refill  .  acetaminophen (TYLENOL) 650 MG CR tablet Take 650 mg by mouth every 6 (six) hours as needed for pain.    Marland Kitchen diclofenac sodium (VOLTAREN) 1 % GEL Apply 2 g topically every 4 (four) hours as needed for pain.    Marland Kitchen donepezil (ARICEPT) 10 MG tablet Take 10 mg by mouth at bedtime.     . finasteride (PROSCAR) 5 MG tablet Take 1 tablet (5 mg total) by mouth daily. Reported on 11/30/2015 90 tablet 3  . fluticasone (FLONASE) 50 MCG/ACT nasal spray Place 2 sprays into both nostrils daily.     Marland Kitchen gabapentin (NEURONTIN) 300 MG capsule Take 300 mg by mouth 3 (three) times daily.    Marland Kitchen glimepiride (AMARYL) 2 MG tablet Take 2 mg by mouth 2 (two) times daily.    Marland Kitchen guaifenesin (HUMIBID E) 400 MG TABS tablet Take 400 mg by mouth every 6 (six) hours as needed (cough/congestion).    Marland Kitchen lisinopril (PRINIVIL,ZESTRIL) 20 MG tablet Take 20 mg by mouth daily. Reported on 11/30/2015    . meloxicam (MOBIC) 15 MG  tablet Take 15 mg by mouth daily.    . memantine (NAMENDA) 10 MG tablet Take 10 mg by mouth 2 (two) times daily.   3  . methazolamide (NEPTAZANE) 50 MG tablet Take 50 mg by mouth daily.   1  . mupirocin ointment (BACTROBAN) 2 % Place 1 application into the nose 3 (three) times daily.    . ondansetron (ZOFRAN) 4 MG tablet Take 1 tablet (4 mg total) by mouth every 6 (six) hours as needed for nausea. 20 tablet 0  . QUEtiapine (SEROQUEL) 25 MG tablet Take 25 mg by mouth 3 (three) times daily.    . sertraline (ZOLOFT) 50 MG tablet Take 50 mg by mouth daily.     . tamsulosin (FLOMAX) 0.4 MG CAPS capsule TAKE ONE CAPSULE BY MOUTH ONCE DAILY, 30 MINUTES AFTER THE SAME MEAL EACH DAY    . timolol (BETIMOL) 0.5 % ophthalmic solution Place 1 drop into both eyes 2 (two) times daily. Reported on 11/30/2015    . Travoprost, BAK Free, (TRAVATAN) 0.004 % SOLN ophthalmic solution Place 1 drop into both eyes at bedtime.     . triamcinolone cream (KENALOG) 0.1 % Apply 1 application topically 2 (two) times daily.    . vitamin B-12 (CYANOCOBALAMIN) 1000 MCG tablet Take 1,000 mcg by mouth. One time every month on the 8th of every month     No current facility-administered medications for this visit.       Marland Kitchen  PHYSICAL EXAMINATION: ECOG PERFORMANCE STATUS: {CHL ONC ECOG PS:[phone]}  There were no vitals filed for this visit. There were no vitals filed for this visit.  Physical Exam   LABORATORY DATA:  I have reviewed the data as listed Lab Results  Component Value Date   WBC 5.8 11/01/2018   HGB 13.5 11/01/2018   HCT 43.5 11/01/2018   MCV 95.2 11/01/2018   PLT 212 11/01/2018   Recent Labs    05/21/18 1014 09/11/18 1259 10/30/18 1722 11/01/18 0500  NA 137 139 138 141  K 4.0 3.9 3.3* 3.6  CL 104 101 104 109  CO2 25 30 25 26   GLUCOSE 192* 111* 92 98  BUN 15 15 21 21   CREATININE 1.11 1.00 0.95 0.89  CALCIUM 9.1 8.7* 8.5* 8.6*  GFRNONAA >60 >60 >60 >60  GFRAA >60 >60 >60 >60  PROT 6.9  --    --   --   ALBUMIN 4.4  --   --   --  AST 16  --   --   --   ALT 10  --   --   --   ALKPHOS 50  --   --   --   BILITOT 0.7  --   --   --     RADIOGRAPHIC STUDIES: I have personally reviewed the radiological images as listed and agreed with the findings in the report. Dg Hand Complete Left  Result Date: 10/30/2018 CLINICAL DATA:  Redness, swelling of middle finger. EXAM: LEFT HAND - COMPLETE 3+ VIEW COMPARISON:  None. FINDINGS: There is no evidence of fracture or dislocation. There is no evidence of arthropathy or other focal bone abnormality. Soft tissues are unremarkable. IMPRESSION: Negative. Electronically Signed   By: Rolm Baptise M.D.   On: 10/30/2018 17:55    ASSESSMENT & PLAN:   No problem-specific Assessment & Plan notes found for this encounter.    All questions were answered. The patient knows to call the clinic with any problems, questions or concerns.       Cammie Sickle, MD 11/19/2018 8:40 AM

## 2019-01-03 ENCOUNTER — Encounter: Payer: Self-pay | Admitting: Emergency Medicine

## 2019-01-03 ENCOUNTER — Inpatient Hospital Stay
Admission: EM | Admit: 2019-01-03 | Discharge: 2019-01-03 | DRG: 603 | Disposition: A | Discharge status: Other Acute Inpatient Hospital | Payer: Medicare Other | Attending: Internal Medicine | Admitting: Internal Medicine

## 2019-01-03 ENCOUNTER — Emergency Department (HOSPITAL_COMMUNITY): Payer: Medicare Other | Admitting: Anesthesiology

## 2019-01-03 ENCOUNTER — Other Ambulatory Visit: Payer: Self-pay

## 2019-01-03 ENCOUNTER — Emergency Department: Payer: Medicare Other

## 2019-01-03 ENCOUNTER — Encounter (HOSPITAL_COMMUNITY)
Admission: EM | Disposition: A | Discharge status: Home Health | Payer: BLUE CROSS/BLUE SHIELD | Source: Home / Self Care | Attending: Family Medicine

## 2019-01-03 ENCOUNTER — Emergency Department (HOSPITAL_COMMUNITY): Payer: Medicare Other

## 2019-01-03 ENCOUNTER — Inpatient Hospital Stay (HOSPITAL_COMMUNITY)
Admission: EM | Admit: 2019-01-03 | Discharge: 2019-01-04 | DRG: 581 | Disposition: A | Discharge status: Home Health | Payer: Medicare Other | Attending: Family Medicine | Admitting: Family Medicine

## 2019-01-03 DIAGNOSIS — L03011 Cellulitis of right finger: Secondary | ICD-10-CM | POA: Diagnosis present

## 2019-01-03 DIAGNOSIS — Z79899 Other long term (current) drug therapy: Secondary | ICD-10-CM

## 2019-01-03 DIAGNOSIS — M7989 Other specified soft tissue disorders: Secondary | ICD-10-CM | POA: Diagnosis present

## 2019-01-03 DIAGNOSIS — Z791 Long term (current) use of non-steroidal anti-inflammatories (NSAID): Secondary | ICD-10-CM

## 2019-01-03 DIAGNOSIS — Z8249 Family history of ischemic heart disease and other diseases of the circulatory system: Secondary | ICD-10-CM

## 2019-01-03 DIAGNOSIS — Z8041 Family history of malignant neoplasm of ovary: Secondary | ICD-10-CM

## 2019-01-03 DIAGNOSIS — Z9221 Personal history of antineoplastic chemotherapy: Secondary | ICD-10-CM | POA: Diagnosis not present

## 2019-01-03 DIAGNOSIS — D472 Monoclonal gammopathy: Secondary | ICD-10-CM | POA: Diagnosis present

## 2019-01-03 DIAGNOSIS — E1152 Type 2 diabetes mellitus with diabetic peripheral angiopathy with gangrene: Secondary | ICD-10-CM | POA: Diagnosis present

## 2019-01-03 DIAGNOSIS — C880 Waldenstrom macroglobulinemia: Secondary | ICD-10-CM | POA: Diagnosis present

## 2019-01-03 DIAGNOSIS — Z888 Allergy status to other drugs, medicaments and biological substances status: Secondary | ICD-10-CM | POA: Diagnosis not present

## 2019-01-03 DIAGNOSIS — Z7951 Long term (current) use of inhaled steroids: Secondary | ICD-10-CM

## 2019-01-03 DIAGNOSIS — Z7984 Long term (current) use of oral hypoglycemic drugs: Secondary | ICD-10-CM | POA: Diagnosis not present

## 2019-01-03 DIAGNOSIS — H409 Unspecified glaucoma: Secondary | ICD-10-CM | POA: Diagnosis not present

## 2019-01-03 DIAGNOSIS — F0390 Unspecified dementia without behavioral disturbance: Secondary | ICD-10-CM | POA: Diagnosis present

## 2019-01-03 DIAGNOSIS — Z8579 Personal history of other malignant neoplasms of lymphoid, hematopoietic and related tissues: Secondary | ICD-10-CM

## 2019-01-03 DIAGNOSIS — I10 Essential (primary) hypertension: Secondary | ICD-10-CM | POA: Diagnosis present

## 2019-01-03 DIAGNOSIS — I96 Gangrene, not elsewhere classified: Secondary | ICD-10-CM

## 2019-01-03 DIAGNOSIS — Z803 Family history of malignant neoplasm of breast: Secondary | ICD-10-CM

## 2019-01-03 DIAGNOSIS — N400 Enlarged prostate without lower urinary tract symptoms: Secondary | ICD-10-CM | POA: Diagnosis present

## 2019-01-03 DIAGNOSIS — M79641 Pain in right hand: Secondary | ICD-10-CM | POA: Diagnosis not present

## 2019-01-03 DIAGNOSIS — L03019 Cellulitis of unspecified finger: Secondary | ICD-10-CM

## 2019-01-03 DIAGNOSIS — E1142 Type 2 diabetes mellitus with diabetic polyneuropathy: Secondary | ICD-10-CM | POA: Diagnosis present

## 2019-01-03 DIAGNOSIS — L03012 Cellulitis of left finger: Secondary | ICD-10-CM | POA: Diagnosis present

## 2019-01-03 DIAGNOSIS — L03119 Cellulitis of unspecified part of limb: Secondary | ICD-10-CM

## 2019-01-03 DIAGNOSIS — F329 Major depressive disorder, single episode, unspecified: Secondary | ICD-10-CM | POA: Diagnosis present

## 2019-01-03 DIAGNOSIS — Z885 Allergy status to narcotic agent status: Secondary | ICD-10-CM | POA: Diagnosis not present

## 2019-01-03 DIAGNOSIS — L03113 Cellulitis of right upper limb: Secondary | ICD-10-CM | POA: Diagnosis present

## 2019-01-03 DIAGNOSIS — Z79891 Long term (current) use of opiate analgesic: Secondary | ICD-10-CM | POA: Diagnosis not present

## 2019-01-03 DIAGNOSIS — L0390 Cellulitis, unspecified: Secondary | ICD-10-CM | POA: Diagnosis present

## 2019-01-03 HISTORY — DX: Waldenstrom macroglobulinemia not having achieved remission: C88.00

## 2019-01-03 HISTORY — PX: I & D EXTREMITY: SHX5045

## 2019-01-03 HISTORY — DX: Waldenstrom macroglobulinemia: C88.0

## 2019-01-03 LAB — CBC
HCT: 39.8 % (ref 39.0–52.0)
Hemoglobin: 12.4 g/dL — ABNORMAL LOW (ref 13.0–17.0)
MCH: 30.2 pg (ref 26.0–34.0)
MCHC: 31.2 g/dL (ref 30.0–36.0)
MCV: 97.1 fL (ref 80.0–100.0)
Platelets: 181 10*3/uL (ref 150–400)
RBC: 4.1 MIL/uL — ABNORMAL LOW (ref 4.22–5.81)
RDW: 13.2 % (ref 11.5–15.5)
WBC: 9.4 10*3/uL (ref 4.0–10.5)
nRBC: 0 % (ref 0.0–0.2)

## 2019-01-03 LAB — BASIC METABOLIC PANEL
Anion gap: 9 (ref 5–15)
BUN: 36 mg/dL — ABNORMAL HIGH (ref 8–23)
CO2: 28 mmol/L (ref 22–32)
Calcium: 8.6 mg/dL — ABNORMAL LOW (ref 8.9–10.3)
Chloride: 103 mmol/L (ref 98–111)
Creatinine, Ser: 1.23 mg/dL (ref 0.61–1.24)
GFR calc Af Amer: >60 mL/min (ref >60)
GFR calc non Af Amer: >60 mL/min (ref >60)
Glucose, Bld: 97 mg/dL (ref 70–99)
Potassium: 3.4 mmol/L — ABNORMAL LOW (ref 3.5–5.1)
Sodium: 140 mmol/L (ref 135–145)

## 2019-01-03 LAB — GLUCOSE, CAPILLARY: Glucose-Capillary: 101 mg/dL — ABNORMAL HIGH (ref 70–99)

## 2019-01-03 LAB — LACTIC ACID, PLASMA: Lactic Acid, Venous: 0.5 mmol/L (ref 0.5–1.9)

## 2019-01-03 SURGERY — IRRIGATION AND DEBRIDEMENT EXTREMITY
Anesthesia: General | Site: Hand | Laterality: Bilateral

## 2019-01-03 MED ORDER — FENTANYL CITRATE (PF) 250 MCG/5ML IJ SOLN
INTRAMUSCULAR | Status: AC
  Filled 2019-01-03: qty 5

## 2019-01-03 MED ORDER — EPHEDRINE SULFATE 50 MG/ML IJ SOLN
INTRAMUSCULAR | Status: DC | PRN
  Administered 2019-01-03: 10 mg via INTRAVENOUS
  Administered 2019-01-03: 15 mg via INTRAVENOUS
  Administered 2019-01-03: 10 mg via INTRAVENOUS
  Administered 2019-01-03: 15 mg via INTRAVENOUS

## 2019-01-03 MED ORDER — VANCOMYCIN HCL 10 G IV SOLR
2000.0000 mg | INTRAVENOUS | Status: DC
  Filled 2019-01-03: qty 2000

## 2019-01-03 MED ORDER — FENTANYL CITRATE (PF) 100 MCG/2ML IJ SOLN
25.0000 ug | INTRAMUSCULAR | Status: DC | PRN
  Administered 2019-01-04 (×2): 25 ug via INTRAVENOUS

## 2019-01-03 MED ORDER — FENTANYL CITRATE (PF) 250 MCG/5ML IJ SOLN
INTRAMUSCULAR | Status: DC | PRN
  Administered 2019-01-03: 100 ug via INTRAVENOUS

## 2019-01-03 MED ORDER — LACTATED RINGERS IV SOLN
INTRAVENOUS | Status: DC | PRN
  Administered 2019-01-03: 22:00:00 via INTRAVENOUS

## 2019-01-03 MED ORDER — VANCOMYCIN HCL 10 G IV SOLR
2000.0000 mg | Freq: Once | INTRAVENOUS | Status: AC
  Administered 2019-01-03: 2000 mg via INTRAVENOUS
  Filled 2019-01-03 (×2): qty 2000

## 2019-01-03 MED ORDER — EPHEDRINE 5 MG/ML INJ
INTRAVENOUS | Status: AC
  Filled 2019-01-03: qty 10

## 2019-01-03 MED ORDER — DEXAMETHASONE SODIUM PHOSPHATE 10 MG/ML IJ SOLN
INTRAMUSCULAR | Status: AC
  Filled 2019-01-03: qty 1

## 2019-01-03 MED ORDER — MORPHINE SULFATE (PF) 4 MG/ML IV SOLN
INTRAVENOUS | Status: AC
  Administered 2019-01-03: 18:00:00 4 mg
  Filled 2019-01-03: qty 1

## 2019-01-03 MED ORDER — DEXAMETHASONE SODIUM PHOSPHATE 10 MG/ML IJ SOLN
INTRAMUSCULAR | Status: DC | PRN
  Administered 2019-01-03: 10 mg via INTRAVENOUS

## 2019-01-03 MED ORDER — LIDOCAINE 2% (20 MG/ML) 5 ML SYRINGE
INTRAMUSCULAR | Status: AC
  Filled 2019-01-03: qty 5

## 2019-01-03 MED ORDER — ROCURONIUM BROMIDE 10 MG/ML (PF) SYRINGE
PREFILLED_SYRINGE | INTRAVENOUS | Status: AC
  Filled 2019-01-03: qty 10

## 2019-01-03 MED ORDER — SUGAMMADEX SODIUM 500 MG/5ML IV SOLN
INTRAVENOUS | Status: AC
  Filled 2019-01-03: qty 5

## 2019-01-03 MED ORDER — LIDOCAINE HCL (CARDIAC) PF 100 MG/5ML IV SOSY
PREFILLED_SYRINGE | INTRAVENOUS | Status: DC | PRN
  Administered 2019-01-03: 70 mg via INTRATRACHEAL

## 2019-01-03 MED ORDER — SUCCINYLCHOLINE 20MG/ML (10ML) SYRINGE FOR MEDFUSION PUMP - OPTIME
INTRAMUSCULAR | Status: DC | PRN
  Administered 2019-01-03: 120 mg via INTRAVENOUS

## 2019-01-03 MED ORDER — MIDAZOLAM HCL 2 MG/2ML IJ SOLN
INTRAMUSCULAR | Status: AC
  Filled 2019-01-03: qty 2

## 2019-01-03 MED ORDER — ONDANSETRON HCL 4 MG/2ML IJ SOLN
INTRAMUSCULAR | Status: DC | PRN
  Administered 2019-01-03: 4 mg via INTRAVENOUS

## 2019-01-03 MED ORDER — ONDANSETRON HCL 4 MG/2ML IJ SOLN
INTRAMUSCULAR | Status: AC
  Filled 2019-01-03: qty 6

## 2019-01-03 MED ORDER — SUGAMMADEX SODIUM 500 MG/5ML IV SOLN
INTRAVENOUS | Status: DC | PRN
  Administered 2019-01-03: 400 mg via INTRAVENOUS

## 2019-01-03 MED ORDER — 0.9 % SODIUM CHLORIDE (POUR BTL) OPTIME
TOPICAL | Status: DC | PRN
  Administered 2019-01-03: 23:00:00 1000 mL

## 2019-01-03 MED ORDER — INSULIN ASPART 100 UNIT/ML ~~LOC~~ SOLN: 0.0000 [IU] | Freq: Three times a day (TID) | SUBCUTANEOUS | Status: DC

## 2019-01-03 MED ORDER — PHENYLEPHRINE 40 MCG/ML (10ML) SYRINGE FOR IV PUSH (FOR BLOOD PRESSURE SUPPORT)
PREFILLED_SYRINGE | INTRAVENOUS | Status: AC
  Filled 2019-01-03: qty 10

## 2019-01-03 MED ORDER — ROCURONIUM 10MG/ML (10ML) SYRINGE FOR MEDFUSION PUMP - OPTIME
INTRAVENOUS | Status: DC | PRN
  Administered 2019-01-03: 50 mg via INTRAVENOUS

## 2019-01-03 MED ORDER — SODIUM CHLORIDE 0.9 % IV BOLUS
1000.0000 mL | Freq: Once | INTRAVENOUS | Status: AC
  Administered 2019-01-03: 14:00:00 1000 mL via INTRAVENOUS

## 2019-01-03 MED ORDER — PROPOFOL 10 MG/ML IV BOLUS
INTRAVENOUS | Status: DC | PRN
  Administered 2019-01-03: 150 mg via INTRAVENOUS

## 2019-01-03 MED ORDER — PROPOFOL 10 MG/ML IV BOLUS
INTRAVENOUS | Status: AC
  Filled 2019-01-03: qty 20

## 2019-01-03 MED ORDER — FENTANYL CITRATE (PF) 100 MCG/2ML IJ SOLN
INTRAMUSCULAR | Status: AC
  Filled 2019-01-03: qty 2

## 2019-01-03 SURGICAL SUPPLY — 52 items
BANDAGE ACE 3X5.8 VEL STRL LF (GAUZE/BANDAGES/DRESSINGS) ×2 IMPLANT
BANDAGE ACE 4X5 VEL STRL LF (GAUZE/BANDAGES/DRESSINGS) ×2 IMPLANT
BANDAGE COBAN LF 1.5X5 NS (GAUZE/BANDAGES/DRESSINGS) ×4 IMPLANT
BANDAGE COBAN STERILE 2 (GAUZE/BANDAGES/DRESSINGS) IMPLANT
BANDAGE ELASTIC 4 VELCRO ST LF (GAUZE/BANDAGES/DRESSINGS) ×2 IMPLANT
BNDG ESMARK 4X9 LF (GAUZE/BANDAGES/DRESSINGS) ×2 IMPLANT
BNDG GAUZE ELAST 4 BULKY (GAUZE/BANDAGES/DRESSINGS) ×2 IMPLANT
CORDS BIPOLAR (ELECTRODE) ×2 IMPLANT
COVER SURGICAL LIGHT HANDLE (MISCELLANEOUS) ×2 IMPLANT
COVER WAND RF STERILE (DRAPES) IMPLANT
CUFF TOURNIQUET SINGLE 18IN (TOURNIQUET CUFF) ×2 IMPLANT
CUFF TOURNIQUET SINGLE 24IN (TOURNIQUET CUFF) IMPLANT
DECANTER SPIKE VIAL GLASS SM (MISCELLANEOUS) ×2 IMPLANT
DRAIN PENROSE 1/4X12 LTX STRL (WOUND CARE) ×2 IMPLANT
DRSG PAD ABDOMINAL 8X10 ST (GAUZE/BANDAGES/DRESSINGS) IMPLANT
FORCEPS BIPOLAR SPETZLER 8 1.0 (NEUROSURGERY SUPPLIES) ×2 IMPLANT
GAUZE PACKING IODOFORM 1/4X15 (GAUZE/BANDAGES/DRESSINGS) ×2 IMPLANT
GAUZE SPONGE 4X4 12PLY STRL (GAUZE/BANDAGES/DRESSINGS) ×6 IMPLANT
GAUZE XEROFORM 1X8 LF (GAUZE/BANDAGES/DRESSINGS) IMPLANT
GAUZE XEROFORM 5X9 LF (GAUZE/BANDAGES/DRESSINGS) ×4 IMPLANT
GLOVE BIO SURGEON STRL SZ7.5 (GLOVE) ×4 IMPLANT
GLOVE BIOGEL PI IND STRL 8 (GLOVE) ×1 IMPLANT
GLOVE BIOGEL PI INDICATOR 8 (GLOVE) ×1
GOWN STRL REUS W/ TWL LRG LVL3 (GOWN DISPOSABLE) ×1 IMPLANT
GOWN STRL REUS W/ TWL XL LVL3 (GOWN DISPOSABLE) ×1 IMPLANT
GOWN STRL REUS W/TWL LRG LVL3 (GOWN DISPOSABLE) ×1
GOWN STRL REUS W/TWL XL LVL3 (GOWN DISPOSABLE) ×1
KIT BASIN OR (CUSTOM PROCEDURE TRAY) ×2 IMPLANT
KIT TURNOVER KIT B (KITS) ×2 IMPLANT
LOOP VESSEL MAXI BLUE (MISCELLANEOUS) IMPLANT
MANIFOLD NEPTUNE II (INSTRUMENTS) ×2 IMPLANT
NEEDLE HYPO 25X1 1.5 SAFETY (NEEDLE) IMPLANT
NS IRRIG 1000ML POUR BTL (IV SOLUTION) ×4 IMPLANT
PACK ORTHO EXTREMITY (CUSTOM PROCEDURE TRAY) ×2 IMPLANT
PAD ARMBOARD 7.5X6 YLW CONV (MISCELLANEOUS) ×4 IMPLANT
PAD CAST 4YDX4 CTTN HI CHSV (CAST SUPPLIES) ×1 IMPLANT
PADDING CAST COTTON 4X4 STRL (CAST SUPPLIES) ×1
SCRUB BETADINE 4OZ XXX (MISCELLANEOUS) ×4 IMPLANT
SET CYSTO W/LG BORE CLAMP LF (SET/KITS/TRAYS/PACK) ×2 IMPLANT
SOL PREP POV-IOD 4OZ 10% (MISCELLANEOUS) ×4 IMPLANT
SPONGE LAP 4X18 RFD (DISPOSABLE) IMPLANT
SUT ETHILON 4 0 P 3 18 (SUTURE) IMPLANT
SUT ETHILON 4 0 PS 2 18 (SUTURE) IMPLANT
SUT MON AB 5-0 P3 18 (SUTURE) IMPLANT
SWAB COLLECTION DEVICE MRSA (MISCELLANEOUS) IMPLANT
SWAB CULTURE ESWAB REG 1ML (MISCELLANEOUS) ×2 IMPLANT
SYR CONTROL 10ML LL (SYRINGE) IMPLANT
TOWEL OR 17X26 10 PK STRL BLUE (TOWEL DISPOSABLE) ×2 IMPLANT
TUBE CONNECTING 12X1/4 (SUCTIONS) ×2 IMPLANT
TUBE FEEDING ENTERAL 5FR 16IN (TUBING) IMPLANT
UNDERPAD 30X30 (UNDERPADS AND DIAPERS) ×4 IMPLANT
YANKAUER SUCT BULB TIP NO VENT (SUCTIONS) ×2 IMPLANT

## 2019-01-03 NOTE — ED Notes (Signed)
Benjamine Mola med tech from [facility] called for update, this RN informed her pt would be admitted to the hospital.

## 2019-01-03 NOTE — ED Triage Notes (Signed)
Pt with swelling and infection to bilateral hands. Pt daughter reports this is the second time he has had an infection in the last couple of months.

## 2019-01-03 NOTE — Consult Note (Signed)
Pharmacy Antibiotic Note  Adrian Valdez is a 68 y.o. male admitted on 01/03/2019 with cellulitis.  Pharmacy has been consulted for Vancomycin dosing. Patient already received an appropriate loading dose of Vancomycin in the ED.   Baseline Scr (11/01/2018): 0.89 mg/dL  Plan: Start Vancomycin 2000 mg Q24H. Will monitor Scr with AM labs.  AUC Goal: 400-550 Expected AUC: 540.9 Cmin: 11.9   Height: 5\' 11"  (180.3 cm) Weight: 200 lb (90.7 kg) IBW/kg (Calculated) : 75.3  Temp (24hrs), Avg:97.5 F (36.4 C), Min:97.5 F (36.4 C), Max:97.5 F (36.4 C)  Recent Labs  Lab 01/03/19 1333 01/03/19 1342  WBC 9.4  --   CREATININE  --  1.23  LATICACIDVEN 0.5  --     Estimated Creatinine Clearance: 68.1 mL/min (by C-G formula based on SCr of 1.23 mg/dL).    Allergies  Allergen Reactions  . Oxybutynin Other (See Comments)    Headache  . Myrbetriq [Mirabegron] Other (See Comments)    Other reaction(s): Other (See Comments) migraine Hallucinations      Antimicrobials this admission: 4/30 Vancomycin  >>    Dose adjustments this admission: N/A  Microbiology results: 4/30 BCx: pending   Thank you for allowing pharmacy to be a part of this patient's care.  Aundria Mems 01/03/2019 3:32 PM

## 2019-01-03 NOTE — Anesthesia Preprocedure Evaluation (Addendum)
Anesthesia Evaluation   Patient awake and Patient confused    Reviewed: Allergy & Precautions, NPO status   Airway Mallampati: II  TM Distance: >3 FB     Dental  (+) Teeth Intact   Pulmonary    breath sounds clear to auscultation       Cardiovascular hypertension,  Rhythm:Regular Rate:Normal     Neuro/Psych Dementia    GI/Hepatic negative GI ROS, Neg liver ROS,   Endo/Other  diabetes  Renal/GU Renal disease     Musculoskeletal   Abdominal   Peds  Hematology   Anesthesia Other Findings   Reproductive/Obstetrics                            Anesthesia Physical Anesthesia Plan  ASA: III  Anesthesia Plan: General   Post-op Pain Management:    Induction: Intravenous  PONV Risk Score and Plan: Ondansetron and Dexamethasone  Airway Management Planned: Oral ETT  Additional Equipment:   Intra-op Plan:   Post-operative Plan: Extubation in OR  Informed Consent: I have reviewed the patients History and Physical, chart, labs and discussed the procedure including the risks, benefits and alternatives for the proposed anesthesia with the patient or authorized representative who has indicated his/her understanding and acceptance.     Dental advisory given  Plan Discussed with: CRNA and Anesthesiologist  Anesthesia Plan Comments:        Anesthesia Quick Evaluation

## 2019-01-03 NOTE — Transfer of Care (Signed)
Immediate Anesthesia Transfer of Care Note  Patient: Adrian Valdez  Procedure(s) Performed: IRRIGATION AND DEBRIDEMENT right index finger, and left middle finger (Bilateral Hand)  Patient Location: PACU  Anesthesia Type:General  Level of Consciousness: sedated and patient cooperative  Airway & Oxygen Therapy: Patient connected to face mask oxygen  Post-op Assessment: Report given to RN and Post -op Vital signs reviewed and stable  Post vital signs: Reviewed and stable  Last Vitals:  Vitals Value Taken Time  BP 132/82 01/03/2019 11:55 PM  Temp    Pulse 99 01/03/2019 11:58 PM  Resp 16 01/03/2019 11:58 PM  SpO2 98 % 01/03/2019 11:58 PM  Vitals shown include unvalidated device data.  Last Pain: There were no vitals filed for this visit.       Complications: No apparent anesthesia complications

## 2019-01-03 NOTE — ED Triage Notes (Signed)
Pt transferred from [facility] with infection in R hand extending up the arm.  Bilateral 20s in [facility], hx dementia

## 2019-01-03 NOTE — ED Notes (Signed)
Okay to sit patient in recliner near nursing station in order to safely observe until sitter is available.

## 2019-01-03 NOTE — H&P (Signed)
Adrian Valdez at [place] NAME: Adrian Valdez    MR#:  [id number]  DATE OF BIRTH:  1951/02/14  DATE OF ADMISSION:  01/03/2019  PRIMARY CARE PHYSICIAN: Merlene Laughter, MD   REQUESTING/REFERRING PHYSICIAN: Sallye Lat  CHIEF COMPLAINT:  right index finger swelling drainage redness for 3 to 4 days.  Patient has dementia poor historian. History is obtained from sister-in-law in the room.  HISTORY OF PRESENT ILLNESS:  Adrian Valdez  is a 68 y.o. male with a known history of Waldenstrom's macroglobulinemia-- follows at the cancer center, dementia, diabetes, glaucoma, hypertension comes to the emergency room from assisted living facility after they noticed right index finger swelling redness and drainage. Patient has dementia has history of picking at skin around his nails. He had similar presentation in February of two 2024 left index finger paronychia treated with IV antibiotics.  X-ray of the right hand shows soft tissue swelling. He received IV vancomycin. White count is 9.4. Patient is being admitted with right hand cellulitis.  Orthopedic Dr. Mack Guise has been informed.  PAST MEDICAL HISTORY:   Past Medical History:  Diagnosis Date  . 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 B12 deficiency 07/27/2015  . Dementia ([place])   . Diabetes mellitus, type 2 ([place])   . Enlarged prostate   . Glaucoma   . Glaucoma   . Hypertension   . Neuropathy    feet and legs  . Urinary frequency   . Vitamin B 12 deficiency   . Waldenstrom macroglobulinemia ([place])   . Waldenstrom's macroglobulinemia ([place])     PAST SURGICAL HISTOIRY:   Past Surgical History:  Procedure Laterality Date  . CATARACT EXTRACTION W/ INTRAOCULAR LENS IMPLANT    . COLONOSCOPY    . KNEE ARTHROSCOPY Right   . LASIK    . PHOTOCOAGULATION WITH LASER Right 05/24/2017   Procedure: PHOTOCOAGULATION WITH LASER;  Surgeon: Leandrew Koyanagi, MD;  Location:  [facility];  Service: Ophthalmology;  Laterality: Right;  Diabetic - oral meds    SOCIAL HISTORY:   Social History   Tobacco Use  . Smoking status: Never Smoker  . Smokeless tobacco: Never Used  Substance Use Topics  . Alcohol use: Yes    Comment: social drinker wine couple of times a year    FAMILY HISTORY:   Family History  Problem Relation Age of Onset  . Hypertension Mother   . Hypertension Father   . CAD Father   . Breast cancer Maternal Grandmother   . Ovarian cancer Paternal Grandmother   . Kidney disease Neg Hx   . Prostate cancer Neg Hx     DRUG ALLERGIES:   Allergies  Allergen Reactions  . Oxybutynin Other (See Comments)    Headache  . Myrbetriq [Mirabegron] Other (See Comments)    Other reaction(s): Other (See Comments) migraine Hallucinations      REVIEW OF SYSTEMS:  Review of Systems  Unable to perform ROS: Dementia  Constitutional: Negative for chills, fever and weight loss.  HENT: Negative for ear discharge, ear pain and nosebleeds.   Eyes: Negative for blurred vision, pain and discharge.  Respiratory: Negative for sputum production, shortness of breath, wheezing and stridor.   Cardiovascular: Negative for chest pain, palpitations, orthopnea and PND.  Gastrointestinal: Negative for abdominal pain, diarrhea, nausea and vomiting.  Genitourinary: Negative for frequency and urgency.  Musculoskeletal: Negative for back pain and joint pain.  Neurological: Negative for sensory change, speech change, focal weakness and  weakness.  Psychiatric/Behavioral: Negative for depression and hallucinations. The patient is not nervous/anxious.      MEDICATIONS AT HOME:   Prior to Admission medications   Medication Sig Start Date End Date Taking? Authorizing Provider  acetaminophen (TYLENOL) 650 MG CR tablet Take 650 mg by mouth every 6 (six) hours as needed for pain.   Yes [provider]  diclofenac sodium (VOLTAREN) 1 % GEL Apply 2 g  topically every 4 (four) hours as needed for pain.   Yes [provider]  donepezil (ARICEPT) 10 MG tablet Take 10 mg by mouth at bedtime.  09/20/16 01/03/19 Yes [provider]  finasteride (PROSCAR) 5 MG tablet Take 1 tablet (5 mg total) by mouth daily. Reported on 11/30/2015 02/07/17  Yes Alexis Frock, MD  fluticasone [facility]) 50 MCG/ACT nasal spray Place 2 sprays into both nostrils daily.  05/02/18 05/02/19 Yes [provider]  gabapentin (NEURONTIN) 300 MG capsule Take 300 mg by mouth 3 (three) times daily.   Yes [provider]  glimepiride (AMARYL) 2 MG tablet Take 2 mg by mouth 2 (two) times daily. 12/22/16 01/03/19 Yes [provider]  guaifenesin (HUMIBID E) 400 MG TABS tablet Take 400 mg by mouth every 6 (six) hours as needed (cough/congestion).   Yes [provider]  HYDROcodone-acetaminophen (NORCO/VICODIN) 5-325 MG tablet Take 1 tablet by mouth every 6 (six) hours as needed for moderate pain.   Yes [provider]  lisinopril (PRINIVIL,ZESTRIL) 20 MG tablet Take 20 mg by mouth daily. Reported on 11/30/2015   Yes [provider]  meloxicam (MOBIC) 15 MG tablet Take 15 mg by mouth daily.   Yes [provider]  memantine (NAMENDA) 10 MG tablet Take 10 mg by mouth 2 (two) times daily.  03/09/18  Yes [provider]  methazolamide (NEPTAZANE) 50 MG tablet Take 50 mg by mouth daily.  07/15/15  Yes [provider]  mupirocin ointment (BACTROBAN) 2 % Apply 1 application topically 3 (three) times daily.    Yes [provider]  QUEtiapine (SEROQUEL) 25 MG tablet Take 25 mg by mouth 3 (three) times daily.   Yes [provider]  sertraline (ZOLOFT) 50 MG tablet Take 50 mg by mouth daily.    Yes [provider]  tamsulosin (FLOMAX) 0.4 MG CAPS capsule Take 0.4 mg by mouth daily.    Yes [provider]  timolol (BETIMOL) 0.5 % ophthalmic solution Place 1 drop into both eyes 2  (two) times daily.    Yes [provider]  Travoprost, BAK Free, (TRAVATAN) 0.004 % SOLN ophthalmic solution Place 1 drop into both eyes at bedtime.    Yes [provider]  triamcinolone cream (KENALOG) 0.1 % Apply 1 application topically 2 (two) times daily.   Yes [provider]  vitamin B-12 (CYANOCOBALAMIN) 1000 MCG tablet Take 1,000 mcg by mouth. One time every month on the 8th of every month   Yes [provider]      VITAL SIGNS:  Blood pressure 119/84, pulse 61, temperature (!) 97.5 F (36.4 C), temperature source Oral, resp. rate 18, height 5\' 11"  (1.803 m), weight 90.7 kg, SpO2 93 %.  PHYSICAL EXAMINATION:  GENERAL:  68 y.o.-year-old patient lying in the bed with no acute distress.  EYES: Pupils equal, round, reactive to light and accommodation. No scleral icterus. Extraocular muscles intact.  HEENT: Head atraumatic, normocephalic. Oropharynx and nasopharynx clear.  NECK:  Supple, no jugular venous distention. No thyroid enlargement, no tenderness.  LUNGS: Normal breath sounds bilaterally, no wheezing, rales,rhonchi or crepitation. No use of accessory muscles of respiration.  CARDIOVASCULAR: S1, S2 normal. No murmurs, rubs, or gallops.  ABDOMEN: Soft, nontender, nondistended. Bowel sounds present. No organomegaly or mass.  EXTREMITIES:      NEUROLOGIC: Limited. Grossly nonfocal  PSYCHIATRIC: The patient is alert and awake. Has baseline dementia SKIN:as above  LABORATORY PANEL:   CBC Recent Labs  Lab 01/03/19 1333  WBC 9.4  HGB 12.4*  HCT 39.8  PLT 181   ------------------------------------------------------------------------------------------------------------------  Chemistries  Recent Labs  Lab 01/03/19 1342  NA 140  K 3.4*  CL 103  CO2 28  GLUCOSE 97  BUN 36*  CREATININE 1.23  CALCIUM 8.6*   ------------------------------------------------------------------------------------------------------------------  Cardiac  Enzymes No results for input(s): TROPONINI in the last 168 hours. ------------------------------------------------------------------------------------------------------------------  RADIOLOGY:  Dg Hand Complete Left  Result Date: 01/03/2019 CLINICAL DATA:  Bilateral hand redness and swelling. History of Waldenstrom's macroglobulinemia. EXAM: RIGHT HAND - COMPLETE 3+ VIEW; LEFT HAND - COMPLETE 3+ VIEW COMPARISON:  Left hand x-rays dated October 30, 2018. FINDINGS: Left hand: No acute fracture or dislocation. Joint spaces are preserved. Bone mineralization is normal. Soft tissues are unremarkable. Right hand: No acute fracture or dislocation. Joint spaces are preserved. Bone mineralization is normal. Soft tissue swelling of the index finger with dorsal irregularity distal to the PIP joint. Mild dorsal hand soft tissue swelling. IMPRESSION: 1. Soft tissue swelling and irregularity of the right index finger. 2. No acute osseous abnormality in either hand. Electronically Signed   By: Titus Dubin M.D.   On: 01/03/2019 13:37   Dg Hand Complete Right  Result Date: 01/03/2019 CLINICAL DATA:  Bilateral hand redness and swelling. History of Waldenstrom's macroglobulinemia. EXAM: RIGHT HAND - COMPLETE 3+ VIEW; LEFT HAND - COMPLETE 3+ VIEW COMPARISON:  Left hand x-rays dated October 30, 2018. FINDINGS: Left hand: No acute fracture or dislocation. Joint spaces are preserved. Bone mineralization is normal. Soft tissues are unremarkable. Right hand: No acute fracture or dislocation. Joint spaces are preserved. Bone mineralization is normal. Soft tissue swelling of the index finger with dorsal irregularity distal to the PIP joint. Mild dorsal hand soft tissue swelling. IMPRESSION: 1. Soft tissue swelling and irregularity of the right index finger. 2. No acute osseous abnormality in either hand. Electronically Signed   By: Titus Dubin M.D.   On: 01/03/2019 13:37    EKG:    IMPRESSION AND PLAN:   Lenoard Helbert  is a 68 y.o. male with a known history of Waldenstrom's macroglobulinemia-- follows at the cancer center, dementia, diabetes, glaucoma, hypertension comes to the emergency room from assisted living facility after they noticed right index finger swelling redness and drainage.  1. Right index finger/hand cellulitis -- IV vancomycin -follow blood culture -PRN pain meds -orthopedic consultation with Dr. Mack Guise  2. Type II diabetes sliding scale insulin  3. Hypertension continue home meds  4. Advanced dementia  5. H/o Waldenstrom's macroglobulinemia--followed by Dr Mike Gip  D/w SIL  All the records are reviewed and case discussed with ED provider.   CODE STATUS: FULL  TOTAL TIME TAKING CARE OF THIS PATIENT: *50* minutes.    Fritzi Mandes M.D on 01/03/2019 at 3:20 PM  Between 7am to 6pm - Pager - [phone]  After 6pm go to [email or web address] - password [email or web address] [email or web address]  [facility]  Office  [phone]  CC: Primary care physician; Merlene Laughter, MD

## 2019-01-03 NOTE — ED Notes (Signed)
Pt has redness and swelling in both hands- the second finger on the right hand was wrapped- wrapping removed by a tech- finger is purple in color and skin appears to be coming off- pt reports this finger had a fluid filled blister that burst and has had "multiple people work on it"- multiple other fingers on both hands appear to have open wounds and drainage- pt reports pain in both hands that radiates up the arms

## 2019-01-03 NOTE — ED Notes (Signed)
Pt sister-in-law requested that I speak with husband about the transfer of his brother - the brother was very rude and at first demanding that pt not be transferred and that he be given antibiotics here - explained the stance of the orthopedic doctor and the admitting doctor - brother agreed to have pt transferred but only if I could guarantee that a family member could sit with him - explained that I could not guarantee this - brother then became angry and voiced that this was insane and that "you all are idiots and dumber than my brother with dementia" - attempted to explain that covid was a concern for all patients and visitors - brother then stated that "covid is a bunch of bullshit and you are full of shit for thinking differently" - this nurse handed the phone back to the sister-in-law and told her that I would not get into an argument about the validity of covid or the spread of the disease

## 2019-01-03 NOTE — ED Notes (Signed)
Pt climbing out the foot of bed - sister-in-law rang call bell - pt needed to have BM - he was placed on toilet - also placed yellow socks, fall bracelet, fall sign and fall alarm mat Sister-in-law states she will stay a little longer and will let nursing staff know prior to leaving room

## 2019-01-03 NOTE — H&P (Addendum)
Adrian Valdez is an 68 y.o. male.   Chief Complaint: right index finger infection HPI: 68 yo male resident at assisted living facility with reported history of worsening infection right index finger over past one week or so.  There is associated swelling and erythema.  Pain in index finger at distal phalanx.  Pain aggravated by palpation.  No fevers, chills, sweats.  Had similar issues with left long finger in February treated with IV abx and resolution, but has noted some redness in left long finger again as well.  Some history taken from previous notes as patient is poor historian.  Case discussed with Dorise Hiss, [facility] and his note from 01/03/2019 reviewed. Xrays viewed and interpreted by me: 3 views bilateral hands show no fractures, dislocations, radioopaque foreign bodies.  Labs reviewed: WBC 9.4  Allergies:  Allergies  Allergen Reactions  . Oxybutynin Other (See Comments)    Headache  . Myrbetriq [Mirabegron] Other (See Comments)    Other reaction(s): Other (See Comments) migraine Hallucinations      Past Medical History:  Diagnosis Date  . 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 B12 deficiency 07/27/2015  . Dementia ([place])   . Diabetes mellitus, type 2 ([place])   . Enlarged prostate   . Glaucoma   . Glaucoma   . Hypertension   . Neuropathy    feet and legs  . Urinary frequency   . Vitamin B 12 deficiency   . Waldenstrom macroglobulinemia ([place])   . Waldenstrom's macroglobulinemia [facility])     Past Surgical History:  Procedure Laterality Date  . CATARACT EXTRACTION W/ INTRAOCULAR LENS IMPLANT    . COLONOSCOPY    . KNEE ARTHROSCOPY Right   . LASIK    . PHOTOCOAGULATION WITH LASER Right 05/24/2017   Procedure: PHOTOCOAGULATION WITH LASER;  Surgeon: Leandrew Koyanagi, MD;  Location: [facility];  Service: Ophthalmology;  Laterality: Right;  Diabetic - oral meds    Family History: Family History  Problem Relation Age of Onset  . Hypertension Mother    . Hypertension Father   . CAD Father   . Breast cancer Maternal Grandmother   . Ovarian cancer Paternal Grandmother   . Kidney disease Neg Hx   . Prostate cancer Neg Hx     Social History:   reports that he has never smoked. He has never used smokeless tobacco. He reports current alcohol use. He reports that he does not use drugs.  Medications: (Not in a hospital admission)   Results for orders placed or performed during the hospital encounter of 01/03/19 (from the past 48 hour(s))  CBC     Status: Abnormal   Collection Time: 01/03/19  1:33 PM  Result Value Ref Range   WBC 9.4 4.0 - 10.5 K/uL   RBC 4.10 (L) 4.22 - 5.81 MIL/uL   Hemoglobin 12.4 (L) 13.0 - 17.0 g/dL   HCT 39.8 39.0 - 52.0 %   MCV 97.1 80.0 - 100.0 fL   MCH 30.2 26.0 - 34.0 pg   MCHC 31.2 30.0 - 36.0 g/dL   RDW 13.2 11.5 - 15.5 %   Platelets 181 150 - 400 K/uL   nRBC 0.0 0.0 - 0.2 %    Comment: Performed at [facility], [address] [place], [place] [address]  Lactic acid, plasma     Status: None   Collection Time: 01/03/19  1:33 PM  Result Value Ref Range   Lactic Acid, Venous 0.5 0.5 - 1.9 mmol/L  Comment: Performed at [facility], [address] [place], [place] [address]  Basic metabolic panel     Status: Abnormal   Collection Time: 01/03/19  1:42 PM  Result Value Ref Range   Sodium 140 135 - 145 mmol/L   Potassium 3.4 (L) 3.5 - 5.1 mmol/L   Chloride 103 98 - 111 mmol/L   CO2 28 22 - 32 mmol/L   Glucose, Bld 97 70 - 99 mg/dL   BUN 36 (H) 8 - 23 mg/dL   Creatinine, Ser 1.23 0.61 - 1.24 mg/dL   Calcium 8.6 (L) 8.9 - 10.3 mg/dL   GFR calc non Af Amer >60 >60 mL/min   GFR calc Af Amer >60 >60 mL/min   Anion gap 9 5 - 15    Comment: Performed at [facility], [address], [place], [place] [address]    Dg Chest Port 1 View  Result Date: 01/03/2019 CLINICAL DATA:  67 year old male with cellulitis EXAM: PORTABLE CHEST 1 VIEW COMPARISON:  09/11/2018 FINDINGS:  Cardiomediastinal silhouette unchanged. As symmetric elevation the right hemidiaphragm with linear scarring/atelectasis at the right lung base. No pleural effusion or confluent airspace disease. Similar appearance of chronic interstitial opacities. No pneumothorax. No displaced fracture IMPRESSION: Negative for acute cardiopulmonary disease Electronically Signed   By: Corrie Mckusick D.O.   On: 01/03/2019 20:55   Dg Hand Complete Left  Result Date: 01/03/2019 CLINICAL DATA:  Bilateral hand redness and swelling. History of Waldenstrom's macroglobulinemia. EXAM: RIGHT HAND - COMPLETE 3+ VIEW; LEFT HAND - COMPLETE 3+ VIEW COMPARISON:  Left hand x-rays dated October 30, 2018. FINDINGS: Left hand: No acute fracture or dislocation. Joint spaces are preserved. Bone mineralization is normal. Soft tissues are unremarkable. Right hand: No acute fracture or dislocation. Joint spaces are preserved. Bone mineralization is normal. Soft tissue swelling of the index finger with dorsal irregularity distal to the PIP joint. Mild dorsal hand soft tissue swelling. IMPRESSION: 1. Soft tissue swelling and irregularity of the right index finger. 2. No acute osseous abnormality in either hand. Electronically Signed   By: Titus Dubin M.D.   On: 01/03/2019 13:37   Dg Hand Complete Right  Result Date: 01/03/2019 CLINICAL DATA:  Bilateral hand redness and swelling. History of Waldenstrom's macroglobulinemia. EXAM: RIGHT HAND - COMPLETE 3+ VIEW; LEFT HAND - COMPLETE 3+ VIEW COMPARISON:  Left hand x-rays dated October 30, 2018. FINDINGS: Left hand: No acute fracture or dislocation. Joint spaces are preserved. Bone mineralization is normal. Soft tissues are unremarkable. Right hand: No acute fracture or dislocation. Joint spaces are preserved. Bone mineralization is normal. Soft tissue swelling of the index finger with dorsal irregularity distal to the PIP joint. Mild dorsal hand soft tissue swelling. IMPRESSION: 1. Soft tissue  swelling and irregularity of the right index finger. 2. No acute osseous abnormality in either hand. Electronically Signed   By: Titus Dubin M.D.   On: 01/03/2019 13:37     A comprehensive review of systems was negative. Review of Systems: No fevers, chills, night sweats, chest pain, shortness of breath, nausea, vomiting, diarrhea, constipation, easy bleeding or bruising, headaches, dizziness, vision changes, fainting.   Blood pressure 129/90, pulse 83, resp. rate 15, SpO2 100 %.  General appearance: alert, cooperative and appears stated age Head: Normocephalic, without obvious abnormality, atraumatic Neck: supple, symmetrical, trachea midline Extremities: Intact sensation and capillary refill all digits.  +epl/fpl/io.  Right index finger with skin sloughing distal to pip joint.  Erythema in pad of finger with tenderness  to palpation.  Erythema into dorsum of finger and hand with streaking into forearm.  Non tender volarly at proximal and middle phalanges and on dorsum of finger and hand in erythematous areas.  Left long finger with small skin sloughing and no tenderness to palpation.  Some redness to dorsal nail fold left small finger without fluctuance. Pulses: 2+ and symmetric Skin: Skin color, texture, turgor normal. No rashes or lesions Neurologic: Grossly normal Incision/Wound: as above  Assessment/Plan Right index finger infection with possible felon and possible right small and left long finger infections.  Recommend OR for incision and drainage right index finger and possibly left long finger.  Risks, benefits and alternatives of surgery were discussed including risks of blood loss, infection, damage to nerves/vessels/tendons/ligament/bone, failure of surgery, need for additional surgery, complication with wound healing, stiffness, possible need for amputation if infection cannot be cleared.  He voiced understanding of these risks and elected to proceed.  This was also discussed with  patient's brother who is POA over telephone and he agrees with the plan of care.  Telephone consent obtained.  Will be admitted to hospitalist service after surgery.  Leanora Cover 01/03/2019, 9:49 PM  Addendum (01/04/19): to correct laterality of erythematous small finger and add to what was discussed in surgical consent.

## 2019-01-03 NOTE — ED Notes (Addendum)
Called patients brother Birl Lobello, [place] for patient and updated him that patient had arrived at [facility] ED and was waiting for a bed upstairs. Plan to be admitted for IV antibiotic and possible surgical debridement of wounds. Family appreciated the update.

## 2019-01-03 NOTE — ED Notes (Signed)
Called pharmacy for [employer]

## 2019-01-03 NOTE — Progress Notes (Addendum)
Patient ID: Adrian Valdez, male   DOB: Feb 02, 1951, 68 y.o.   MRN: [id number] per Dr. Mack Guise patient may require surgery which would be better off handled by hand ortho specialist. We do not have hand ortho specialist here at [facility]. Discussed with Gerald Stabs PA in the ER for transfer to tertiary care center where hand specialist would be available.  Discussed with sister-in-law and patient's brother on the phone regarding  transfer to tertiary care center.  Admission orders cancelled. Pt transferring from ER to tertiary care center. D/c summary will not be done

## 2019-01-03 NOTE — ED Notes (Signed)
ED TO INPATIENT HANDOFF REPORT  ED Nurse Name and Phone #: Helene Kelp [phone]  S Name/Age/Gender Adrian Valdez 68 y.o. male Room/Bed: ED17A/ED17A  Code Status   Code Status: Prior  Home/SNF/Other Nursing Home Patient oriented to: self Is this baseline? Yes   Triage Complete: Triage complete  Chief Complaint r hand swollen  Triage Note Pt with swelling and infection to bilateral hands. Pt daughter reports this is the second time he has had an infection in the last couple of months.   Pt daughter reports the symptoms with his hands are related to [place] which the patient has.   Jadene Pierini, pt sister in Sports coach, Phone number [phone].    Allergies Allergies  Allergen Reactions  . Oxybutynin Other (See Comments)    Headache  . Myrbetriq [Mirabegron] Other (See Comments)    Other reaction(s): Other (See Comments) migraine Hallucinations      Level of Care/Admitting Diagnosis ED Disposition    ED Disposition Condition [facility] Area: [place] [100120]  Level of Care: Med-Surg [16]  Covid Evaluation: N/A  Diagnosis: Cellulitis [462703]  Admitting Physician: Odessa Fleming  Attending Physician: Odessa Fleming  Estimated length of stay: past midnight tomorrow  Certification:: I certify this patient will need inpatient services for at least 2 midnights  PT Class (Do Not Modify): Inpatient [101]  PT Acc Code (Do Not Modify): Private [1]       B Medical/Surgery History Past Medical History:  Diagnosis Date  . 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 B12 deficiency 07/27/2015  . Dementia ([place])   . Diabetes mellitus, type 2 ([place])   . Enlarged prostate   . Glaucoma   . Glaucoma   . Hypertension   . Neuropathy    feet and legs  . Urinary frequency   . Vitamin B 12 deficiency   . Waldenstrom macroglobulinemia ([place])   . Waldenstrom's macroglobulinemia [facility])    Past Surgical History:   Procedure Laterality Date  . CATARACT EXTRACTION W/ INTRAOCULAR LENS IMPLANT    . COLONOSCOPY    . KNEE ARTHROSCOPY Right   . LASIK    . PHOTOCOAGULATION WITH LASER Right 05/24/2017   Procedure: PHOTOCOAGULATION WITH LASER;  Surgeon: Leandrew Koyanagi, MD;  Location: [place] [place];  Service: Ophthalmology;  Laterality: Right;  Diabetic - oral meds     A IV Location/Drains/Wounds Patient Lines/Drains/Airways Status   Active Line/Drains/Airways    Name:   Placement date:   Placement time:   Site:   Days:   Peripheral IV 10/30/18 Right Arm   10/30/18    1723    Arm   65   Peripheral IV 01/03/19 Left Antecubital   01/03/19    1320    Antecubital   less than 1   Peripheral IV 01/03/19 Right Antecubital   01/03/19    1325    Antecubital   less than 1   Incision (Closed) 05/24/17 Eye Right   05/24/17    0942     589          Intake/Output Last 24 hours  Intake/Output Summary (Last 24 hours) at 01/03/2019 1544 Last data filed at 01/03/2019 1544 Gross per 24 hour  Intake 1000 ml  Output -  Net 1000 ml    Labs/Imaging Results for orders placed or performed during the hospital encounter of 01/03/19 (from the past 48 hour(s))  CBC     Status: Abnormal   Collection  Time: 01/03/19  1:33 PM  Result Value Ref Range   WBC 9.4 4.0 - 10.5 K/uL   RBC 4.10 (L) 4.22 - 5.81 MIL/uL   Hemoglobin 12.4 (L) 13.0 - 17.0 g/dL   HCT 39.8 39.0 - 52.0 %   MCV 97.1 80.0 - 100.0 fL   MCH 30.2 26.0 - 34.0 pg   MCHC 31.2 30.0 - 36.0 g/dL   RDW 13.2 11.5 - 15.5 %   Platelets 181 150 - 400 K/uL   nRBC 0.0 0.0 - 0.2 %    Comment: Performed at [facility], [address] [place], [place] [address]  Lactic acid, plasma     Status: None   Collection Time: 01/03/19  1:33 PM  Result Value Ref Range   Lactic Acid, Venous 0.5 0.5 - 1.9 mmol/L    Comment: Performed at [facility], [address] [place], [place] [address]  Basic metabolic panel     Status: Abnormal   Collection  Time: 01/03/19  1:42 PM  Result Value Ref Range   Sodium 140 135 - 145 mmol/L   Potassium 3.4 (L) 3.5 - 5.1 mmol/L   Chloride 103 98 - 111 mmol/L   CO2 28 22 - 32 mmol/L   Glucose, Bld 97 70 - 99 mg/dL   BUN 36 (H) 8 - 23 mg/dL   Creatinine, Ser 1.23 0.61 - 1.24 mg/dL   Calcium 8.6 (L) 8.9 - 10.3 mg/dL   GFR calc non Af Amer >60 >60 mL/min   GFR calc Af Amer >60 >60 mL/min   Anion gap 9 5 - 15    Comment: Performed at [facility], [address] [place], [place] [address]   Dg Hand Complete Left  Result Date: 01/03/2019 CLINICAL DATA:  Bilateral hand redness and swelling. History of Waldenstrom's macroglobulinemia. EXAM: RIGHT HAND - COMPLETE 3+ VIEW; LEFT HAND - COMPLETE 3+ VIEW COMPARISON:  Left hand x-rays dated October 30, 2018. FINDINGS: Left hand: No acute fracture or dislocation. Joint spaces are preserved. Bone mineralization is normal. Soft tissues are unremarkable. Right hand: No acute fracture or dislocation. Joint spaces are preserved. Bone mineralization is normal. Soft tissue swelling of the index finger with dorsal irregularity distal to the PIP joint. Mild dorsal hand soft tissue swelling. IMPRESSION: 1. Soft tissue swelling and irregularity of the right index finger. 2. No acute osseous abnormality in either hand. Electronically Signed   By: Titus Dubin M.D.   On: 01/03/2019 13:37   Dg Hand Complete Right  Result Date: 01/03/2019 CLINICAL DATA:  Bilateral hand redness and swelling. History of Waldenstrom's macroglobulinemia. EXAM: RIGHT HAND - COMPLETE 3+ VIEW; LEFT HAND - COMPLETE 3+ VIEW COMPARISON:  Left hand x-rays dated October 30, 2018. FINDINGS: Left hand: No acute fracture or dislocation. Joint spaces are preserved. Bone mineralization is normal. Soft tissues are unremarkable. Right hand: No acute fracture or dislocation. Joint spaces are preserved. Bone mineralization is normal. Soft tissue swelling of the index finger with dorsal irregularity distal  to the PIP joint. Mild dorsal hand soft tissue swelling. IMPRESSION: 1. Soft tissue swelling and irregularity of the right index finger. 2. No acute osseous abnormality in either hand. Electronically Signed   By: Titus Dubin M.D.   On: 01/03/2019 13:37    Pending Labs Unresulted Labs (From admission, onward)    Start     Ordered   01/03/19 1309  Blood culture (routine x 2)  BLOOD CULTURE X 2,   STAT  01/03/19 1308   01/03/19 1259  Lactic acid, plasma  Now then every 2 hours,   STAT     01/03/19 1258   Signed and Held  CBC  (enoxaparin (LOVENOX)    CrCl >/= 30 ml/min)  Once,   R    Comments:  Baseline for enoxaparin therapy IF NOT ALREADY DRAWN.  Notify MD if PLT < 100 K.    Signed and Held   Signed and Held  Creatinine, serum  (enoxaparin (LOVENOX)    CrCl >/= 30 ml/min)  Once,   R    Comments:  Baseline for enoxaparin therapy IF NOT ALREADY DRAWN.    Signed and Held   Signed and Held  Creatinine, serum  (enoxaparin (LOVENOX)    CrCl >/= 30 ml/min)  Weekly,   R    Comments:  while on enoxaparin therapy    Signed and Held          Vitals/Pain Today's Vitals   01/03/19 1224 01/03/19 1235 01/03/19 1315 01/03/19 1415  BP:  99/69 113/78 119/84  Pulse:  64 61 61  Resp:  18    Temp:  (!) 97.5 F (36.4 C)    TempSrc:  Oral    SpO2:  100% 99% 93%  Weight: 90.7 kg     Height: 5\' 11"  (1.803 m)     PainSc:        Isolation Precautions No active isolations  Medications Medications  vancomycin (VANCOCIN) 2,000 mg in sodium chloride 0.9 % 500 mL IVPB (2,000 mg Intravenous New Bag/Given 01/03/19 1417)  insulin aspart (novoLOG) injection 0-9 Units (has no administration in time range)  vancomycin (VANCOCIN) 2,000 mg in sodium chloride 0.9 % 500 mL IVPB (has no administration in time range)  sodium chloride 0.9 % bolus 1,000 mL (0 mLs Intravenous Stopped 01/03/19 1544)    Mobility walks Low fall risk   Focused Assessments Cardiac Assessment Handoff:    No results found  for: CKTOTAL, CKMB, CKMBINDEX, TROPONINI No results found for: DDIMER Does the Patient currently have chest pain? No      R Recommendations: See Admitting Provider Note  Report given to:   Additional Notes: comes from memory care unit

## 2019-01-03 NOTE — ED Triage Notes (Signed)
Adrian Valdez, pt sister in [employer] coach, Phone number [phone].

## 2019-01-03 NOTE — ED Notes (Signed)
Report called to Lennette Bihari RN at [facility] ED He states that the sister-in-law will not be allowed in the ED - sister-in-law notified

## 2019-01-03 NOTE — H&P (Deleted)
NAME: Adrian Valdez MEDICAL RECORD NO: [id number] DATE OF BIRTH: 12/03/1950 FACILITY: [facility] LOCATION: [facility] OR PHYSICIAN: Tennis Must, MD   OPERATIVE REPORT   DATE OF PROCEDURE: 01/03/19    PREOPERATIVE DIAGNOSIS:   Right index finger infection, left long finger infection   POSTOPERATIVE DIAGNOSIS:   Right index finger infection, left long finger infection, left small finger infection   PROCEDURE:   1.  Right index finger incision and drainage including felon 2.  Left ring finger removal of nail plate and incision of paronychia 3.  Left long finger debridement of epidermis and nailbed approximately 1 x 2 cm   SURGEON:  Leanora Cover, M.D.   ASSISTANT: none   ANESTHESIA:  General   INTRAVENOUS FLUIDS:  Per anesthesia flow sheet.   ESTIMATED BLOOD LOSS:  Minimal.   COMPLICATIONS:  None.   SPECIMENS:   Cultures from right index finger to micro   TOURNIQUET TIME:    Total Tourniquet Time Documented: Upper Arm (Right) - 28 minutes Total: Upper Arm (Right) - 28 minutes    DISPOSITION:  Stable to PACU.   INDICATIONS: 68 year old male resident of assisted living facility with infection of right index finger with progressive swelling and erythema with streaking up into hand and forearm.  He also had a similar problem of the left long finger approximately 2 months ago treated with IV antibiotics and resolution of the seems to be recurring.  He is also noted to have redness at the left small finger dorsal nail fold.   I recommended incision and drainage of bilateral hands as necessary.  Risks, benefits and alternatives of surgery were discussed including the risks of blood loss, infection, damage to nerves, vessels, tendons, ligaments, bone for surgery, need for additional surgery, complications with wound healing, continued pain, stiffness, need for repeat irrigation debridement, amputation.  He voiced understanding of these risks and elected to proceed.  This was discussed with  his brother, who is POA, over the phone and he agreed with the plan of care.  Telephone consent was obtained.  OPERATIVE COURSE:  After being identified preoperatively by myself,  the patient and I agreed on the procedure and site of the procedure.  The surgical site was marked.  Surgical consent had been signed. He was given IV antibiotics as preoperative antibiotic prophylaxis. He was transferred to the operating room and placed on the operating table in supine position with the Bilateral upper extremity on an arm board.  General anesthesia was induced by the anesthesiologist.  Bilateral upper extremity was prepped and draped in normal sterile orthopedic fashion.  A surgical pause was performed between the surgeons, anesthesia, and operating room staff and all were in agreement as to the patient, procedure, and site of procedure.  The right index finger was addressed first. Tourniquet at the proximal aspect of the extremity was inflated to 250 mmHg after exsanguination of the arm with an Esmarch bandage.    The epidermis was debrided sharply with the scissors.  There was thin fluid underneath with bits of debris.  Cultures were taken for aerobes and anaerobes.  There was fluid under the nail.  The nail plate was removed.  There were areas of dermis with punctate bleeding spots and areas of skin that looked more leathery.  Incision was made at the ulnar side of the distal phalanx to drain the felon.  No gross purulence was encountered.  The wounds were copiously irrigated with sterile saline.  Quarter-inch iodoform gauze was placed  into the incision for the pad of the finger.  Xeroform is placed the nail fold and all wounds dressed with sterile Xeroform and 4 x 4's and wrapped with a Coban dressing lightly.  An AlumaFoam splint was placed and wrapped lightly with Coban dressing.  Tourniquet was deflated at 28 minutes.  Fingertips were pink with brisk capillary refill after deflation of tourniquet.  The operative   drapes were broken down.    The left upper extremity was then addressed.  The hand was prepped to the wrist.  It was then sterilely draped.  A Penrose drain was used at the long finger.  Again the epidermis on the dorsum of the finger was debrided with the scissors.  There was a thickened cap of dried tissue over the proximal aspect of the nail plate which was removed.  There was a early small nail growing back which came out with it.  There was no gross purulence.  There is no fluctuance.  It was not felt an incision was necessary.  The wound was copiously irrigated with sterile saline.  There was noted to be an adhesion between the dorsal nail roof and the nail plate.  Xeroform was placed in the nail fold and the wound dressed with sterile Xeroform 4 x 4 and wrapped with Coban dressing lightly.  An AlumaFoam splint was placed and wrapped lightly with Coban dressing.  It was also noted that the small finger had erythema at the dorsal nail fold.  This nail plate was removed.  There is a small amount of purulence coming from underneath the cuticle.  Incision was made in the undersurface of the dorsal nail groove to allow drainage.  No gross purulence was encountered after the incision.  The wound was again copiously irrigated with sterile saline.  A piece of iodoform gauze was placed into the incision.  A piece of Xeroform was placed in the nail fold and the wound dressed with sterile Xeroform 4 x 4 and wrapped with a Coban dressing lightly.  An AlumaFoam splint was placed and wrapped lightly with Coban dressing.  A Penrose drain had been used on the small finger as well.  Each of these Penrose drains were out for less than 10 minutes.  The operative drapes were broken down.  The patient was awoken from anesthesia safely.  He was transferred back to the stretcher and taken to PACU in stable condition.  He has been admitted to the hospitalist for IV antibiotics.   Leanora Cover, MD Electronically signed,  01/03/19

## 2019-01-03 NOTE — ED Provider Notes (Signed)
[facility] EMERGENCY DEPARTMENT Provider Note   CSN: [id number] Arrival date & time: 01/03/19  1843    History   Chief Complaint No chief complaint on file.   HPI Adrian Valdez is a 68 y.o. male.     Patient sent over from [facility] emergency department where patient was evaluated for cellulitis to the right hand forearm and a questionable infected index finger.  As well as left middle finger.  They had discussed the case with Raford Pitcher our hand surgery on today.  And he had accepted the patient in transfer.  He has evaluated the patient and thinks that he needs to do some I&D of the right index finger right middle finger.  But due to extensive medical problems recommending internal medicine admission.  Patient has a history of dementia diabetes type 2 glaucoma hypertension and most significantly Walter Strom's macroglobulinemia.  Patient's vital signs here are significant for some tachypnea.  Oxygen saturations are in the upper 90s on room air.  Patient does have a mask on.     Past Medical History:  Diagnosis Date  . 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 B12 deficiency 07/27/2015  . Dementia ([place])   . Diabetes mellitus, type 2 ([employer])   . Enlarged prostate   . Glaucoma   . Glaucoma   . Hypertension   . Neuropathy    feet and legs  . Urinary frequency   . Vitamin B 12 deficiency   . Waldenstrom macroglobulinemia ([place])   . Waldenstrom's macroglobulinemia [facility])     Patient Active Problem List   Diagnosis Date Noted  . Cellulitis 01/03/2019  . Cellulitis of left hand 10/30/2018  . Encounter for antineoplastic immunotherapy 11/20/2016  . Encounter for antineoplastic chemotherapy 10/30/2016  . Altered mental status 09/22/2016  . Waldenstrom's macroglobulinemia ([place]) 09/01/2016  . Enlarged prostate with urinary obstruction 08/16/2016  . Prostate cancer screening 07/19/2016  . Right Non-Complex Renal Cyst 07/19/2016  . Dysuria  11/30/2015  . Urgency of urination 11/30/2015  . Nocturia 11/30/2015  . 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 B12 deficiency 07/27/2015  . 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 Confusion state 07/07/2015  . BP (high blood pressure) 07/07/2015  . Major depressive disorder, single episode, moderate ([place]) 07/07/2015  . FOM (frequency of micturition) 07/07/2015    Past Surgical History:  Procedure Laterality Date  . CATARACT EXTRACTION W/ INTRAOCULAR LENS IMPLANT    . COLONOSCOPY    . KNEE ARTHROSCOPY Right   . LASIK    . PHOTOCOAGULATION WITH LASER Right 05/24/2017   Procedure: PHOTOCOAGULATION WITH LASER;  Surgeon: Leandrew Koyanagi, MD;  Location: [facility];  Service: Ophthalmology;  Laterality: Right;  Diabetic - oral meds        Home Medications    Prior to Admission medications   Medication Sig Start Date End Date Taking? Authorizing Provider  acetaminophen (TYLENOL) 650 MG CR tablet Take 650 mg by mouth every 6 (six) hours as needed for pain.    [provider]  diclofenac sodium (VOLTAREN) 1 % GEL Apply 2 g topically every 4 (four) hours as needed for pain.    [provider]  donepezil (ARICEPT) 10 MG tablet Take 10 mg by mouth at bedtime.  09/20/16 01/03/19  [provider]  finasteride (PROSCAR) 5 MG tablet Take 1 tablet (5 mg total) by mouth daily. Reported on 11/30/2015 02/07/17   Alexis Frock, MD  fluticasone [facility]) 50 MCG/ACT nasal  spray Place 2 sprays into both nostrils daily.  05/02/18 05/02/19  [provider]  gabapentin (NEURONTIN) 300 MG capsule Take 300 mg by mouth 3 (three) times daily.    [provider]  glimepiride (AMARYL) 2 MG tablet Take 2 mg by mouth 2 (two) times daily. 12/22/16 01/03/19  [provider]  guaifenesin (HUMIBID E) 400 MG TABS tablet Take 400 mg by mouth every 6 (six) hours as needed (cough/congestion).    [provider]   HYDROcodone-acetaminophen (NORCO/VICODIN) 5-325 MG tablet Take 1 tablet by mouth every 6 (six) hours as needed for moderate pain.    [provider]  lisinopril (PRINIVIL,ZESTRIL) 20 MG tablet Take 20 mg by mouth daily. Reported on 11/30/2015    [provider]  meloxicam (MOBIC) 15 MG tablet Take 15 mg by mouth daily.    [provider]  memantine (NAMENDA) 10 MG tablet Take 10 mg by mouth 2 (two) times daily.  03/09/18   [provider]  methazolamide (NEPTAZANE) 50 MG tablet Take 50 mg by mouth daily.  07/15/15   [provider]  mupirocin ointment (BACTROBAN) 2 % Apply 1 application topically 3 (three) times daily.     [provider]  QUEtiapine (SEROQUEL) 25 MG tablet Take 25 mg by mouth 3 (three) times daily.    [provider]  sertraline (ZOLOFT) 50 MG tablet Take 50 mg by mouth daily.     [provider]  tamsulosin (FLOMAX) 0.4 MG CAPS capsule Take 0.4 mg by mouth daily.     [provider]  timolol (BETIMOL) 0.5 % ophthalmic solution Place 1 drop into both eyes 2 (two) times daily.     [provider]  Travoprost, BAK Free, (TRAVATAN) 0.004 % SOLN ophthalmic solution Place 1 drop into both eyes at bedtime.     [provider]  triamcinolone cream (KENALOG) 0.1 % Apply 1 application topically 2 (two) times daily.    [provider]  vitamin B-12 (CYANOCOBALAMIN) 1000 MCG tablet Take 1,000 mcg by mouth. One time every month on the 8th of every month    [provider]    Family History Family History  Problem Relation Age of Onset  . Hypertension Mother   . Hypertension Father   . CAD Father   . Breast cancer Maternal Grandmother   . Ovarian cancer Paternal Grandmother   . Kidney disease Neg Hx   . Prostate cancer Neg Hx     Social History Social History   Tobacco Use  . Smoking status: Never Smoker  . Smokeless tobacco: Never Used  Substance Use Topics  .  Alcohol use: Yes    Comment: social drinker wine couple of times a year  . Drug use: No     Allergies   Oxybutynin and Myrbetriq [mirabegron]   Review of Systems Review of Systems  Unable to perform ROS: Dementia     Physical Exam Updated Vital Signs BP 132/77   Pulse 79   Resp (!) 27   SpO2 98%   Physical Exam Vitals signs and nursing note reviewed.  Constitutional:      Appearance: Normal appearance. He is well-developed.  HENT:     Head: Normocephalic and atraumatic.  Eyes:     Extraocular Movements: Extraocular movements intact.     Conjunctiva/sclera: Conjunctivae normal.     Pupils: Pupils are equal, round, and reactive to light.  Neck:     Musculoskeletal: Neck supple.  Cardiovascular:  Rate and Rhythm: Normal rate and regular rhythm.     Heart sounds: No murmur.  Pulmonary:     Effort: Pulmonary effort is normal. No respiratory distress.     Breath sounds: Normal breath sounds. No wheezing, rhonchi or rales.  Abdominal:     Palpations: Abdomen is soft.     Tenderness: There is no abdominal tenderness.  Musculoskeletal:     Right lower leg: Edema present.     Left lower leg: Edema present.     Comments: Right index finger with some dried exudate and some cellulitis perhaps some superficial pus.  Some swelling to the index finger.  There are some red streaking that goes from the index finger up into the forearm.  Left middle finger has a small area of similar findings but no extension of any erythema into the arm.  Radial pulse on both sides is 2+.  Skin:    General: Skin is warm and dry.  Neurological:     Mental Status: He is alert. Mental status is at baseline.      ED Treatments / Results  Labs (all labs ordered are listed, but only abnormal results are displayed) Labs Reviewed - No data to display  EKG None  Radiology Dg Hand Complete Left  Result Date: 01/03/2019 CLINICAL DATA:  Bilateral hand redness and swelling. History of  Waldenstrom's macroglobulinemia. EXAM: RIGHT HAND - COMPLETE 3+ VIEW; LEFT HAND - COMPLETE 3+ VIEW COMPARISON:  Left hand x-rays dated October 30, 2018. FINDINGS: Left hand: No acute fracture or dislocation. Joint spaces are preserved. Bone mineralization is normal. Soft tissues are unremarkable. Right hand: No acute fracture or dislocation. Joint spaces are preserved. Bone mineralization is normal. Soft tissue swelling of the index finger with dorsal irregularity distal to the PIP joint. Mild dorsal hand soft tissue swelling. IMPRESSION: 1. Soft tissue swelling and irregularity of the right index finger. 2. No acute osseous abnormality in either hand. Electronically Signed   By: Titus Dubin M.D.   On: 01/03/2019 13:37   Dg Hand Complete Right  Result Date: 01/03/2019 CLINICAL DATA:  Bilateral hand redness and swelling. History of Waldenstrom's macroglobulinemia. EXAM: RIGHT HAND - COMPLETE 3+ VIEW; LEFT HAND - COMPLETE 3+ VIEW COMPARISON:  Left hand x-rays dated October 30, 2018. FINDINGS: Left hand: No acute fracture or dislocation. Joint spaces are preserved. Bone mineralization is normal. Soft tissues are unremarkable. Right hand: No acute fracture or dislocation. Joint spaces are preserved. Bone mineralization is normal. Soft tissue swelling of the index finger with dorsal irregularity distal to the PIP joint. Mild dorsal hand soft tissue swelling. IMPRESSION: 1. Soft tissue swelling and irregularity of the right index finger. 2. No acute osseous abnormality in either hand. Electronically Signed   By: Titus Dubin M.D.   On: 01/03/2019 13:37    Procedures Procedures (including critical care time)  Medications Ordered in ED Medications - No data to display   Initial Impression / Assessment and Plan / ED Course  I have reviewed the triage vital signs and the nursing notes.  Pertinent labs & imaging results that were available during my care of the patient were reviewed by me and  considered in my medical decision making (see chart for details).        Discussed with hand surgery.  Will consult internal medicine for admission.  Patient technically would be unassigned.  He is followed by internal medicine at the could note a clinic in [place].  Patient is tachypneic oxygen  saturations are good but will go ahead and get chest x-ray prior to surgery.  The hand surgery and is planning to take him up for I&D of the right index finger and the right middle finger.  Labs from earlier today without significant abnormalities.  Results for orders placed or performed during the hospital encounter of 01/03/19  CBC  Result Value Ref Range   WBC 9.4 4.0 - 10.5 K/uL   RBC 4.10 (L) 4.22 - 5.81 MIL/uL   Hemoglobin 12.4 (L) 13.0 - 17.0 g/dL   HCT 39.8 39.0 - 52.0 %   MCV 97.1 80.0 - 100.0 fL   MCH 30.2 26.0 - 34.0 pg   MCHC 31.2 30.0 - 36.0 g/dL   RDW 13.2 11.5 - 15.5 %   Platelets 181 150 - 400 K/uL   nRBC 0.0 0.0 - 0.2 %  Lactic acid, plasma  Result Value Ref Range   Lactic Acid, Venous 0.5 0.5 - 1.9 mmol/L  Basic metabolic panel  Result Value Ref Range   Sodium 140 135 - 145 mmol/L   Potassium 3.4 (L) 3.5 - 5.1 mmol/L   Chloride 103 98 - 111 mmol/L   CO2 28 22 - 32 mmol/L   Glucose, Bld 97 70 - 99 mg/dL   BUN 36 (H) 8 - 23 mg/dL   Creatinine, Ser 1.23 0.61 - 1.24 mg/dL   Calcium 8.6 (L) 8.9 - 10.3 mg/dL   GFR calc non Af Amer >60 >60 mL/min   GFR calc Af Amer >60 >60 mL/min   Anion gap 9 5 - 15     Final Clinical Impressions(s) / ED Diagnoses   Final diagnoses:  Cellulitis of hand    ED Discharge Orders    None       Fredia Sorrow, MD 01/03/19 2037

## 2019-01-03 NOTE — ED Notes (Signed)
Adrian Valdez, would like an update as soon as possible at [phone]

## 2019-01-03 NOTE — Consult Note (Addendum)
ORTHOPAEDIC CONSULTATION  REQUESTING PHYSICIAN: Adrian Mandes, MD  Chief Complaint: Left index finger infection  HPI: Adrian Valdez is a 68 y.o. male with a history of dementia, type 2 diabetes and Waldenstrom macroglobulinemia who is sent from his nursing facility for increasing redness and swelling in the left index finger.  Patient is reported to have a history of "picking" his fingers.  Patient was treated for right middle finger cellulitis in February 2020 with IV antibiotics.  It is reported that the redness and swelling began in the left index finger and is proceeded to extend into the dorsum of his hand and forearm.  Orthopedics is consulted to evaluate the left index finger.  Past Medical History:  Diagnosis Date  . 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 B12 deficiency 07/27/2015  . Dementia ([place])   . Diabetes mellitus, type 2 ([place])   . Enlarged prostate   . Glaucoma   . Glaucoma   . Hypertension   . Neuropathy    feet and legs  . Urinary frequency   . Vitamin B 12 deficiency   . Waldenstrom macroglobulinemia ([place])   . Waldenstrom's macroglobulinemia [facility])    Past Surgical History:  Procedure Laterality Date  . CATARACT EXTRACTION W/ INTRAOCULAR LENS IMPLANT    . COLONOSCOPY    . KNEE ARTHROSCOPY Right   . LASIK    . PHOTOCOAGULATION WITH LASER Right 05/24/2017   Procedure: PHOTOCOAGULATION WITH LASER;  Surgeon: Leandrew Koyanagi, MD;  Location: [facility];  Service: Ophthalmology;  Laterality: Right;  Diabetic - oral meds   Social History   Socioeconomic History  . Marital status: Single    Spouse name: Not on file  . Number of children: Not on file  . Years of education: Not on file  . Highest education level: Not on file  Occupational History  . Not on file  Social Needs  . Financial resource strain: Not on file  . Food insecurity:    Worry: Not on file    Inability: Not on file  . Transportation needs:    Medical: Not on file     Non-medical: Not on file  Tobacco Use  . Smoking status: Never Smoker  . Smokeless tobacco: Never Used  Substance and Sexual Activity  . Alcohol use: Yes    Comment: social drinker wine couple of times a year  . Drug use: No  . Sexual activity: Not on file  Lifestyle  . Physical activity:    Days per week: Not on file    Minutes per session: Not on file  . Stress: Not on file  Relationships  . Social connections:    Talks on phone: Not on file    Gets together: Not on file    Attends religious service: Not on file    Active member of club or organization: Not on file    Attends meetings of clubs or organizations: Not on file    Relationship status: Not on file  Other Topics Concern  . Not on file  Social History Narrative  . Not on file   Family History  Problem Relation Age of Onset  . Hypertension Mother   . Hypertension Father   . CAD Father   . Breast cancer Maternal Grandmother   . Ovarian cancer Paternal Grandmother   . Kidney disease Neg Hx   . Prostate cancer Neg Hx    Allergies  Allergen Reactions  . Oxybutynin Other (See Comments)    Headache  .  Myrbetriq [Mirabegron] Other (See Comments)    Other reaction(s): Other (See Comments) migraine Hallucinations     Prior to Admission medications   Medication Sig Start Date End Date Taking? Authorizing Provider  acetaminophen (TYLENOL) 650 MG CR tablet Take 650 mg by mouth every 6 (six) hours as needed for pain.   Yes [provider]  diclofenac sodium (VOLTAREN) 1 % GEL Apply 2 g topically every 4 (four) hours as needed for pain.   Yes [provider]  donepezil (ARICEPT) 10 MG tablet Take 10 mg by mouth at bedtime.  09/20/16 01/03/19 Yes [provider]  finasteride (PROSCAR) 5 MG tablet Take 1 tablet (5 mg total) by mouth daily. Reported on 11/30/2015 02/07/17  Yes Alexis Frock, MD  fluticasone [facility]) 50 MCG/ACT nasal spray Place 2 sprays into both nostrils daily.  05/02/18 05/02/19  Yes [provider]  gabapentin (NEURONTIN) 300 MG capsule Take 300 mg by mouth 3 (three) times daily.   Yes [provider]  glimepiride (AMARYL) 2 MG tablet Take 2 mg by mouth 2 (two) times daily. 12/22/16 01/03/19 Yes [provider]  guaifenesin (HUMIBID E) 400 MG TABS tablet Take 400 mg by mouth every 6 (six) hours as needed (cough/congestion).   Yes [provider]  HYDROcodone-acetaminophen (NORCO/VICODIN) 5-325 MG tablet Take 1 tablet by mouth every 6 (six) hours as needed for moderate pain.   Yes [provider]  lisinopril (PRINIVIL,ZESTRIL) 20 MG tablet Take 20 mg by mouth daily. Reported on 11/30/2015   Yes [provider]  meloxicam (MOBIC) 15 MG tablet Take 15 mg by mouth daily.   Yes [provider]  memantine (NAMENDA) 10 MG tablet Take 10 mg by mouth 2 (two) times daily.  03/09/18  Yes [provider]  methazolamide (NEPTAZANE) 50 MG tablet Take 50 mg by mouth daily.  07/15/15  Yes [provider]  mupirocin ointment (BACTROBAN) 2 % Apply 1 application topically 3 (three) times daily.    Yes [provider]  QUEtiapine (SEROQUEL) 25 MG tablet Take 25 mg by mouth 3 (three) times daily.   Yes [provider]  sertraline (ZOLOFT) 50 MG tablet Take 50 mg by mouth daily.    Yes [provider]  tamsulosin (FLOMAX) 0.4 MG CAPS capsule Take 0.4 mg by mouth daily.    Yes [provider]  timolol (BETIMOL) 0.5 % ophthalmic solution Place 1 drop into both eyes 2 (two) times daily.    Yes [provider]  Travoprost, BAK Free, (TRAVATAN) 0.004 % SOLN ophthalmic solution Place 1 drop into both eyes at bedtime.    Yes [provider]  triamcinolone cream (KENALOG) 0.1 % Apply 1 application topically 2 (two) times daily.   Yes [provider]  vitamin B-12 (CYANOCOBALAMIN) 1000 MCG tablet Take 1,000 mcg by mouth. One time every month on the 8th of every month    Yes [provider]   Dg Hand Complete Left  Result Date: 01/03/2019 CLINICAL DATA:  Bilateral hand redness and swelling. History of Waldenstrom's macroglobulinemia. EXAM: RIGHT HAND - COMPLETE 3+ VIEW; LEFT HAND - COMPLETE 3+ VIEW COMPARISON:  Left hand x-rays dated October 30, 2018. FINDINGS: Left hand: No acute fracture or dislocation. Joint spaces are preserved. Bone mineralization is normal. Soft tissues are unremarkable. Right hand: No acute fracture or dislocation. Joint spaces are preserved. Bone mineralization is normal. Soft tissue swelling of the index finger with dorsal irregularity distal to the PIP joint. Mild dorsal  hand soft tissue swelling. IMPRESSION: 1. Soft tissue swelling and irregularity of the right index finger. 2. No acute osseous abnormality in either hand. Electronically Signed   By: Titus Dubin M.D.   On: 01/03/2019 13:37   Dg Hand Complete Right  Result Date: 01/03/2019 CLINICAL DATA:  Bilateral hand redness and swelling. History of Waldenstrom's macroglobulinemia. EXAM: RIGHT HAND - COMPLETE 3+ VIEW; LEFT HAND - COMPLETE 3+ VIEW COMPARISON:  Left hand x-rays dated October 30, 2018. FINDINGS: Left hand: No acute fracture or dislocation. Joint spaces are preserved. Bone mineralization is normal. Soft tissues are unremarkable. Right hand: No acute fracture or dislocation. Joint spaces are preserved. Bone mineralization is normal. Soft tissue swelling of the index finger with dorsal irregularity distal to the PIP joint. Mild dorsal hand soft tissue swelling. IMPRESSION: 1. Soft tissue swelling and irregularity of the right index finger. 2. No acute osseous abnormality in either hand. Electronically Signed   By: Titus Dubin M.D.   On: 01/03/2019 13:37    Positive ROS: All other systems have been reviewed and were otherwise negative with the exception of those mentioned in the HPI and as above.  Physical Exam: General: Awake, no acute distress.  He is  afebrile, vital signs are stable.   MUSCULOSKELETAL: Left upper extremity: The patient has erythema of the dorsal forearm and hand which is faint.  The hand and forearm compartments are soft and compressible.  The index finger itself shows extensive erythema approximal to the PIP joint.  Distal to the PIP joint the patient has what appears to be a ruptured pressure blister likely from his significant swelling and possible early necrosis.  This involves the dorsal and radial side of the finger.  Patient has intact sensation to light touch over the ulnar side of the finger.  Pain to motion is limited due to pain and swelling.  There is serosanguineous drainage from the blistered area of the finger.  No purulent drainage is noted.  Patient's middle finger is missing the nail plate and shows mild erythema over the distal phalanx.  There is no active drainage.  Assessment: Left upper extremity cellulitis with possible skin necrosis of the distal half of the left index finger.  Plan:  The patient has extensive cellulitis originating from the left index finger.  The distal half of the index finger appears to have early skin necrosis.  Pictures are of the finger are available in Dr. Gus Height Patel's note.  The patient currently does not have an elevated WBC.   His hand xrays do not show osteomyelitis or acute trauma.  The appearance of the patient's finger is concerning for developing necrosis.  The patient's index finger may require extensive surgical debridement.  [facility] does not have a hand surgeon who can take care of this patient.  I am recommending transfer to a tertiary center with hand surgery specialty for further evaluation and surgical management.  Patient will require IV antibiotics for the cellulitis in his left forearm.     Thornton Park, MD    01/03/2019 4:24 PM

## 2019-01-03 NOTE — Progress Notes (Signed)
Family Meeting Note  Advance Directive:{yes Today a meeting took place with the sister-in-law patient has history of dementia comes in from assisted living with right hand cellulitis. He has history of diabetes and hypertension. Code status address with sister-in-law. Patient is a full code according to her. Palliative care as needed. Time spent 16 minutes Fritzi Mandes, MD

## 2019-01-03 NOTE — ED Notes (Signed)
ED TO INPATIENT HANDOFF REPORT  ED Nurse Name and Phone #:  Lonn [place] [phone] S Name/Age/Gender Adrian Valdez 68 y.o. male Room/Bed: 035C/035C  Code Status   Code Status: Prior  Home/SNF/Other Skilled nursing facility Patient oriented to: self Is this baseline? Yes   Triage Complete: Triage complete  Chief Complaint [place] tx  Triage Note Pt transferred from [place] with infection in R hand extending up the arm.  Bilateral 20s in [facility], hx dementia    Allergies Allergies  Allergen Reactions  . Oxybutynin Other (See Comments)    Headache  . Myrbetriq [Mirabegron] Other (See Comments)    Other reaction(s): Other (See Comments) migraine Hallucinations      Level of Care/Admitting Diagnosis ED Disposition    None      B Medical/Surgery History Past Medical History:  Diagnosis Date  . 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 B12 deficiency 07/27/2015  . Dementia ([place])   . Diabetes mellitus, type 2 ([place])   . Enlarged prostate   . Glaucoma   . Glaucoma   . Hypertension   . Neuropathy    feet and legs  . Urinary frequency   . Vitamin B 12 deficiency   . Waldenstrom macroglobulinemia ([place])   . Waldenstrom's macroglobulinemia [facility] [address])    Past Surgical History:  Procedure Laterality Date  . CATARACT EXTRACTION W/ INTRAOCULAR LENS IMPLANT    . COLONOSCOPY    . KNEE ARTHROSCOPY Right   . LASIK    . PHOTOCOAGULATION WITH LASER Right 05/24/2017   Procedure: PHOTOCOAGULATION WITH LASER;  Surgeon: Leandrew Koyanagi, MD;  Location: [facility];  Service: Ophthalmology;  Laterality: Right;  Diabetic - oral meds     A IV Location/Drains/Wounds Patient Lines/Drains/Airways Status   Active Line/Drains/Airways    Name:   Placement date:   Placement time:   Site:   Days:   Peripheral IV 01/03/19 Left Antecubital   01/03/19    1320    Antecubital   less than 1   Peripheral IV 01/03/19 Right Antecubital   01/03/19    1325    Antecubital   less than 1    Incision (Closed) 05/24/17 Eye Right   05/24/17    0942     589          Intake/Output Last 24 hours No intake or output data in the 24 hours ending 01/03/19 2111  Labs/Imaging Results for orders placed or performed during the hospital encounter of 01/03/19 (from the past 48 hour(s))  CBC     Status: Abnormal   Collection Time: 01/03/19  1:33 PM  Result Value Ref Range   WBC 9.4 4.0 - 10.5 K/uL   RBC 4.10 (L) 4.22 - 5.81 MIL/uL   Hemoglobin 12.4 (L) 13.0 - 17.0 g/dL   HCT 39.8 39.0 - 52.0 %   MCV 97.1 80.0 - 100.0 fL   MCH 30.2 26.0 - 34.0 pg   MCHC 31.2 30.0 - 36.0 g/dL   RDW 13.2 11.5 - 15.5 %   Platelets 181 150 - 400 K/uL   nRBC 0.0 0.0 - 0.2 %    Comment: Performed at [facility], [address] [place], [place] [address]  Lactic acid, plasma     Status: None   Collection Time: 01/03/19  1:33 PM  Result Value Ref Range   Lactic Acid, Venous 0.5 0.5 - 1.9 mmol/L    Comment: Performed at [facility], [address], [place], [place] [address]  Basic  metabolic panel     Status: Abnormal   Collection Time: 01/03/19  1:42 PM  Result Value Ref Range   Sodium 140 135 - 145 mmol/L   Potassium 3.4 (L) 3.5 - 5.1 mmol/L   Chloride 103 98 - 111 mmol/L   CO2 28 22 - 32 mmol/L   Glucose, Bld 97 70 - 99 mg/dL   BUN 36 (H) 8 - 23 mg/dL   Creatinine, Ser 1.23 0.61 - 1.24 mg/dL   Calcium 8.6 (L) 8.9 - 10.3 mg/dL   GFR calc non Af Amer >60 >60 mL/min   GFR calc Af Amer >60 >60 mL/min   Anion gap 9 5 - 15    Comment: Performed at [facility], [address], [place], [place] [address]   Dg Chest Port 1 View  Result Date: 01/03/2019 CLINICAL DATA:  68 year old male with cellulitis EXAM: PORTABLE CHEST 1 VIEW COMPARISON:  09/11/2018 FINDINGS: Cardiomediastinal silhouette unchanged. As symmetric elevation the right hemidiaphragm with linear scarring/atelectasis at the right lung base. No pleural effusion or confluent airspace disease. Similar  appearance of chronic interstitial opacities. No pneumothorax. No displaced fracture IMPRESSION: Negative for acute cardiopulmonary disease Electronically Signed   By: Corrie Mckusick D.O.   On: 01/03/2019 20:55   Dg Hand Complete Left  Result Date: 01/03/2019 CLINICAL DATA:  Bilateral hand redness and swelling. History of Waldenstrom's macroglobulinemia. EXAM: RIGHT HAND - COMPLETE 3+ VIEW; LEFT HAND - COMPLETE 3+ VIEW COMPARISON:  Left hand x-rays dated October 30, 2018. FINDINGS: Left hand: No acute fracture or dislocation. Joint spaces are preserved. Bone mineralization is normal. Soft tissues are unremarkable. Right hand: No acute fracture or dislocation. Joint spaces are preserved. Bone mineralization is normal. Soft tissue swelling of the index finger with dorsal irregularity distal to the PIP joint. Mild dorsal hand soft tissue swelling. IMPRESSION: 1. Soft tissue swelling and irregularity of the right index finger. 2. No acute osseous abnormality in either hand. Electronically Signed   By: Titus Dubin M.D.   On: 01/03/2019 13:37   Dg Hand Complete Right  Result Date: 01/03/2019 CLINICAL DATA:  Bilateral hand redness and swelling. History of Waldenstrom's macroglobulinemia. EXAM: RIGHT HAND - COMPLETE 3+ VIEW; LEFT HAND - COMPLETE 3+ VIEW COMPARISON:  Left hand x-rays dated October 30, 2018. FINDINGS: Left hand: No acute fracture or dislocation. Joint spaces are preserved. Bone mineralization is normal. Soft tissues are unremarkable. Right hand: No acute fracture or dislocation. Joint spaces are preserved. Bone mineralization is normal. Soft tissue swelling of the index finger with dorsal irregularity distal to the PIP joint. Mild dorsal hand soft tissue swelling. IMPRESSION: 1. Soft tissue swelling and irregularity of the right index finger. 2. No acute osseous abnormality in either hand. Electronically Signed   By: Titus Dubin M.D.   On: 01/03/2019 13:37    Pending Labs Unresulted Labs  (From admission, onward)   None      Vitals/Pain Today's Vitals   01/03/19 1900 01/03/19 1945 01/03/19 2015 01/03/19 2100  BP: 137/84 132/77 127/74 137/77  Pulse: 68 79 75 83  Resp: (!) 22 (!) 27 (!) 24 18  SpO2: 99% 98% 97% 98%    Isolation Precautions No active isolations  Medications Medications - No data to display  Mobility walks Moderate fall risk   Focused Assessments    R Recommendations: See Admitting Provider Note  Report given to:   Additional Notes:

## 2019-01-03 NOTE — ED Notes (Signed)
Pt sister-in-law very agitated and upset that I could not promise her that the pt would have specific labs completed and the oncologist to eval when the pt was at [facility] - attempted to explain the dynamics of transfer multiple times without success

## 2019-01-03 NOTE — ED Notes (Signed)
After pt saw ortho it was determined that he needed a hand specialist and needed to be transferred - admitting doctor to notify family

## 2019-01-03 NOTE — Anesthesia Procedure Notes (Signed)
Procedure Name: Intubation Date/Time: 01/03/2019 10:36 PM Performed by: Valetta Fuller, CRNA Pre-anesthesia Checklist: Patient identified, Emergency Drugs available, Suction available and Patient being monitored Patient Re-evaluated:Patient Re-evaluated prior to induction Oxygen Delivery Method: Circle system utilized Preoxygenation: Pre-oxygenation with 100% oxygen Induction Type: IV induction, Rapid sequence and Cricoid Pressure applied Laryngoscope Size: Miller and 2 Grade View: Grade III Tube size: 7.5 mm Number of attempts: 1 Airway Equipment and Method: Stylet Placement Confirmation: ETT inserted through vocal cords under direct vision,  positive ETCO2 and breath sounds checked- equal and bilateral Secured at: 23 cm Tube secured with: Tape Dental Injury: Teeth and Oropharynx as per pre-operative assessment  Comments: Only able to visualize base of arytenoids.

## 2019-01-03 NOTE — ED Notes (Signed)
[facility]  CALLED  FOR  TRANSFER 

## 2019-01-03 NOTE — ED Provider Notes (Addendum)
[facility] EMERGENCY DEPARTMENT Provider Note   CSN: [id number] Arrival date & time: 01/03/19  1216    History   Chief Complaint Chief Complaint  Patient presents with  . Hand Pain    HPI Adrian Valdez is a 68 y.o. male with a history of diabetes, dementia, [place]cie [place] macroglobulinemia presents to the emergency department with daughter-in-law for evaluation of bilateral hand swelling and redness.  Patient has a history of recent admission for left middle finger paronychial infection that responded well to IV antibiotics.  He was discharged back to his facility.  Daughter-in-law believes for at least 1 week the right hand has been swollen with significant swelling throughout the right index finger with redness and serous drainage along with severe pain throughout the right index finger and hand.  Patient is a difficult historian.  He resides at [facility] assisted [facility].  He has not been on any antibiotics.     HPI  Past Medical History:  Diagnosis Date  . 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 B12 deficiency 07/27/2015  . Dementia ([place])   . Diabetes mellitus, type 2 ([place])   . Enlarged prostate   . Glaucoma   . Glaucoma   . Hypertension   . Neuropathy    feet and legs  . Urinary frequency   . Vitamin B 12 deficiency   . Waldenstrom macroglobulinemia ([place])   . Waldenstrom's macroglobulinemia [facility])     Patient Active Problem List   Diagnosis Date Noted  . Cellulitis 01/03/2019  . Cellulitis of left hand 10/30/2018  . Encounter for antineoplastic immunotherapy 11/20/2016  . Encounter for antineoplastic chemotherapy 10/30/2016  . Altered mental status 09/22/2016  . Waldenstrom's macroglobulinemia ([place]) 09/01/2016  . Enlarged prostate with urinary obstruction 08/16/2016  . Prostate cancer screening 07/19/2016  . Right Non-Complex Renal Cyst 07/19/2016  . Dysuria 11/30/2015  . Urgency of urination 11/30/2015  . Nocturia 11/30/2015  .  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 B12 deficiency 07/27/2015  . 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 Confusion state 07/07/2015  . BP (high blood pressure) 07/07/2015  . Major depressive disorder, single episode, moderate ([place]) 07/07/2015  . FOM (frequency of micturition) 07/07/2015    Past Surgical History:  Procedure Laterality Date  . CATARACT EXTRACTION W/ INTRAOCULAR LENS IMPLANT    . COLONOSCOPY    . KNEE ARTHROSCOPY Right   . LASIK    . PHOTOCOAGULATION WITH LASER Right 05/24/2017   Procedure: PHOTOCOAGULATION WITH LASER;  Surgeon: Leandrew Koyanagi, MD;  Location: [place];  Service: Ophthalmology;  Laterality: Right;  Diabetic - oral meds        Home Medications    Prior to Admission medications   Medication Sig Start Date End Date Taking? Authorizing Provider  acetaminophen (TYLENOL) 650 MG CR tablet Take 650 mg by mouth every 6 (six) hours as needed for pain.   Yes [provider]  diclofenac sodium (VOLTAREN) 1 % GEL Apply 2 g topically every 4 (four) hours as needed for pain.   Yes [provider]  donepezil (ARICEPT) 10 MG tablet Take 10 mg by mouth at bedtime.  09/20/16 01/03/19 Yes [provider]  finasteride (PROSCAR) 5 MG tablet Take 1 tablet (5 mg total) by mouth daily. Reported on 11/30/2015 02/07/17  Yes Alexis Frock, MD  fluticasone [facility]) 50 MCG/ACT nasal spray Place 2 sprays into both nostrils daily.  05/02/18 05/02/19 Yes [provider]  gabapentin (NEURONTIN) 300 MG  capsule Take 300 mg by mouth 3 (three) times daily.   Yes [provider]  glimepiride (AMARYL) 2 MG tablet Take 2 mg by mouth 2 (two) times daily. 12/22/16 01/03/19 Yes [provider]  guaifenesin (HUMIBID E) 400 MG TABS tablet Take 400 mg by mouth every 6 (six) hours as needed (cough/congestion).   Yes [provider]  HYDROcodone-acetaminophen (NORCO/VICODIN) 5-325 MG tablet  Take 1 tablet by mouth every 6 (six) hours as needed for moderate pain.   Yes [provider]  lisinopril (PRINIVIL,ZESTRIL) 20 MG tablet Take 20 mg by mouth daily. Reported on 11/30/2015   Yes [provider]  meloxicam (MOBIC) 15 MG tablet Take 15 mg by mouth daily.   Yes [provider]  memantine (NAMENDA) 10 MG tablet Take 10 mg by mouth 2 (two) times daily.  03/09/18  Yes [provider]  methazolamide (NEPTAZANE) 50 MG tablet Take 50 mg by mouth daily.  07/15/15  Yes [provider]  mupirocin ointment (BACTROBAN) 2 % Apply 1 application topically 3 (three) times daily.    Yes [provider]  QUEtiapine (SEROQUEL) 25 MG tablet Take 25 mg by mouth 3 (three) times daily.   Yes [provider]  sertraline (ZOLOFT) 50 MG tablet Take 50 mg by mouth daily.    Yes [provider]  tamsulosin (FLOMAX) 0.4 MG CAPS capsule Take 0.4 mg by mouth daily.    Yes [provider]  timolol (BETIMOL) 0.5 % ophthalmic solution Place 1 drop into both eyes 2 (two) times daily.    Yes [provider]  Travoprost, BAK Free, (TRAVATAN) 0.004 % SOLN ophthalmic solution Place 1 drop into both eyes at bedtime.    Yes [provider]  triamcinolone cream (KENALOG) 0.1 % Apply 1 application topically 2 (two) times daily.   Yes [provider]  vitamin B-12 (CYANOCOBALAMIN) 1000 MCG tablet Take 1,000 mcg by mouth. One time every month on the 8th of every month   Yes [provider]    Family History Family History  Problem Relation Age of Onset  . Hypertension Mother   . Hypertension Father   . CAD Father   . Breast cancer Maternal Grandmother   . Ovarian cancer Paternal Grandmother   . Kidney disease Neg Hx   . Prostate cancer Neg Hx     Social History Social History   Tobacco Use  . Smoking status: Never Smoker  . Smokeless tobacco: Never Used  Substance Use Topics  . Alcohol use: Yes     Comment: social drinker wine couple of times a year  . Drug use: No     Allergies   Oxybutynin and Myrbetriq [mirabegron]   Review of Systems Review of Systems  Constitutional: Negative for fever.  Respiratory: Negative for shortness of breath.   Cardiovascular: Negative for chest pain.  Gastrointestinal: Negative for nausea and vomiting.  Musculoskeletal: Positive for arthralgias, joint swelling and myalgias.  Skin: Positive for wound. Negative for rash.  Neurological: Negative for dizziness and headaches.     Physical Exam Updated Vital Signs BP 119/84   Pulse 61   Temp (!) 97.5 F (36.4 C) (Oral)   Resp 18   Ht 5\' 11"  (1.803 m)   Wt 90.7 kg   SpO2 93%   BMI 27.89 kg/m   Physical Exam Constitutional:      Appearance: He is well-developed.  HENT:     Head: Normocephalic and atraumatic.  Eyes:  Conjunctiva/sclera: Conjunctivae normal.  Neck:     Musculoskeletal: Normal range of motion.  Cardiovascular:     Rate and Rhythm: Normal rate.  Pulmonary:     Effort: Pulmonary effort is normal. No respiratory distress.  Musculoskeletal:     Comments: Examination of the left hand shows shows dorsal aspect of the middle finger has serous drainage with erythema.  Mild swelling throughout the left middle finger minimal swelling throughout the left hand.  No fluctuant abscess noted.  No pulp space tenderness.  Examination of the right hand shows diffuse right hand swelling with swelling of the index finger.  Right index finger is nearly twice the size of the left.  He has pain along the volar aspect of the index finger with inability to flex or fully extend the index finger.  Severe tenderness along the right index finger pulp space with redness and swelling.  He is unable to make a fist.  Dorsal aspect of the right index finger shows similar serous drainage on the dorsal aspect with no fluctuance.  There is sloughing of the tissue along the dorsal aspect of the index finger  from the base of the nail to the PIP joint.  Erythema streaking along the volar radial aspect of the forearm into the right antecubital fossa.  No signs of compartment syndrome.  Skin:    General: Skin is warm.     Findings: No rash.  Neurological:     Mental Status: He is alert and oriented to person, place, and time.  Psychiatric:        Behavior: Behavior normal.        Thought Content: Thought content normal.      ED Treatments / Results  Labs (all labs ordered are listed, but only abnormal results are displayed) Labs Reviewed  CBC - Abnormal; Notable for the following components:      Result Value   RBC 4.10 (*)    Hemoglobin 12.4 (*)    All other components within normal limits  BASIC METABOLIC PANEL - Abnormal; Notable for the following components:   Potassium 3.4 (*)    BUN 36 (*)    Calcium 8.6 (*)    All other components within normal limits  CULTURE, BLOOD (ROUTINE X 2)  CULTURE, BLOOD (ROUTINE X 2)  LACTIC ACID, PLASMA  LACTIC ACID, PLASMA    EKG None  Radiology Dg Hand Complete Left  Result Date: 01/03/2019 CLINICAL DATA:  Bilateral hand redness and swelling. History of Waldenstrom's macroglobulinemia. EXAM: RIGHT HAND - COMPLETE 3+ VIEW; LEFT HAND - COMPLETE 3+ VIEW COMPARISON:  Left hand x-rays dated October 30, 2018. FINDINGS: Left hand: No acute fracture or dislocation. Joint spaces are preserved. Bone mineralization is normal. Soft tissues are unremarkable. Right hand: No acute fracture or dislocation. Joint spaces are preserved. Bone mineralization is normal. Soft tissue swelling of the index finger with dorsal irregularity distal to the PIP joint. Mild dorsal hand soft tissue swelling. IMPRESSION: 1. Soft tissue swelling and irregularity of the right index finger. 2. No acute osseous abnormality in either hand. Electronically Signed   By: Titus Dubin M.D.   On: 01/03/2019 13:37   Dg Hand Complete Right  Result Date: 01/03/2019 CLINICAL DATA:   Bilateral hand redness and swelling. History of Waldenstrom's macroglobulinemia. EXAM: RIGHT HAND - COMPLETE 3+ VIEW; LEFT HAND - COMPLETE 3+ VIEW COMPARISON:  Left hand x-rays dated October 30, 2018. FINDINGS: Left hand: No acute fracture or dislocation. Joint spaces are preserved. Bone  mineralization is normal. Soft tissues are unremarkable. Right hand: No acute fracture or dislocation. Joint spaces are preserved. Bone mineralization is normal. Soft tissue swelling of the index finger with dorsal irregularity distal to the PIP joint. Mild dorsal hand soft tissue swelling. IMPRESSION: 1. Soft tissue swelling and irregularity of the right index finger. 2. No acute osseous abnormality in either hand. Electronically Signed   By: Titus Dubin M.D.   On: 01/03/2019 13:37    Procedures Procedures (including critical care time)  Medications Ordered in ED Medications  insulin aspart (novoLOG) injection 0-9 Units (has no administration in time range)  vancomycin (VANCOCIN) 2,000 mg in sodium chloride 0.9 % 500 mL IVPB (has no administration in time range)  sodium chloride 0.9 % bolus 1,000 mL (0 mLs Intravenous Stopped 01/03/19 1544)  vancomycin (VANCOCIN) 2,000 mg in sodium chloride 0.9 % 500 mL IVPB (2,000 mg Intravenous New Bag/Given 01/03/19 1417)     Initial Impression / Assessment and Plan / ED Course  I have reviewed the triage vital signs and the nursing notes.  Pertinent labs & imaging results that were available during my care of the patient were reviewed by me and considered in my medical decision making (see chart for details).        68 year old male with cellulitis to the left middle finger and more severely the right index finger, right hand with streaking up the right forearm.  Right index finger shows sloughing of the tissue along with fluctuance of the pulp space and circumferential swelling with limited range of motion concerning for possible early flexor tenosynovitis.  Patient  CBC and BMP within normal limits.  He is started on IV fluids and blood cultures obtained.  Discussed case with orthopedic surgeon on call who recommended patient needing hand specialty due to complexity of case and likely nonviable tissue.  [place] hand surgeon paged at approximately 4:15 PM.  5:05 PM discussed case with [place] hand surgeon who recommended patient come into [facility] emergency department for evaluation.  He recommend patient be n.p.o.  Will arrange for transport.  Patient stable.  Final Clinical Impressions(s) / ED Diagnoses   Final diagnoses:  Cellulitis of right upper extremity  Cellulitis of left index finger  [place] of finger  Cellulitis of right index finger    ED Discharge Orders    None       Renata Caprice 01/03/19 1455    Lavonia Drafts, MD 01/03/19 1456    Duanne Guess, PA-C 01/03/19 1711    Lavonia Drafts, MD 01/07/19 [phone]

## 2019-01-03 NOTE — ED Notes (Addendum)
Per Gerald Stabs [facility] pt is ER to ER transfer to see the hand specialist - pt to remain NPO at this time Took sister-in-law a sandwich tray with drink and she declined to eat d/t pt NPO status

## 2019-01-03 NOTE — ED Notes (Signed)
Pt wandering the hall, pt redirected back to room, staffing notified that a safety sitter is needed.

## 2019-01-03 NOTE — ED Triage Notes (Signed)
Pt daughter reports the symptoms with his hands are related to [place] which the patient has.

## 2019-01-03 NOTE — H&P (Addendum)
[facility] Admission History and Physical Service Pager: [phone]  Patient name: Adrian Valdez Medical record number: [id number] Date of birth: [birth year] Age: 68 y.o. Gender: male  Primary Care Provider: Merlene Laughter, MD Consultants: Orthopedic surgery Code Status: Full (confirmed by brother Eisen Robenson who is HCPOA)  Chief Complaint: Necrosis of left middle finger and right index finger  Assessment and Plan: Aashrith Oluwadamilola Rosamond is a 68 y.o. male presenting with cellulitis with necrosis of left middle finger and right index finger.  PMH is significant for dementia, Waldenstrom macroglobulinemia, NIDDM with neuropathy, HTN, glaucoma, BPH, B12 deficiency, MDD.  1.  Cellulitis with necrosis of left middle finger and right index finger: Concerning for tenosynovitis.  Orthopedic surgery planning on debridement of affected digits.  No signs of osteomyelitis on plain films bilaterally.  Patient has a prior history of similar infection involving the left middle finger back in February 2020 with improvement on IV vancomycin and was subsequently discharged on oral Bactrim.  Blood cultures were obtained and patient is hemodynamically stable and afebrile.  There is no leukocytosis or leukopenia. - Admit to [place] following surgical debridement in OR, attending Dr. Erin Hearing - Orthopedic surgery consulted, appreciate recommendations for planned debridement and subsequent pain management - Continue IV vancomycin (4/30-), blood cultures pending - Repeat CBC in BMET in the morning - Incentive spirometry  2.  Dementia with MDD: Resident at memory unit at [facility].  Sees Rufina Falco, NP with neurology.  Brother Jacoby Zanni is HCPOA. - Continue home Aricept 10 mg nightly, Namenda 10 mg twice daily, Zoloft 50 mg daily, and Seroquel 25 mg 3 times daily - Holding home gabapentin 300 mg 3 times daily  3.  Non-insulin-dependent type 2 diabetes with polyneuropathy: Currently on  Amaryl. - Holding home Amaryl 2 mg twice daily - Sensitive sliding scale, CBGs every 4 hours while n.p.o., checking A1c  4.  Glaucoma: Chronic. - Continue methazolamide 50 mg daily   5.  BPH: Chronic.   - Continue finasteride 5 mg daily and Flomax 0.4 mg daily  6.  Hypertension: Normotensive on admission in the setting of infection. - Holding home lisinopril 20 mg daily  7.  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  Bone marrow biopsy and December 2017 revealed mature B-cell lymphoma with plasmacytic differentiation.  Received chemotherapy in 2018.  Followed by hematology [facility].  Last seen September 2019.  Receiving parenteral B12 supplementation monthly. - Follow-up outpatient  FEN/GI: NPO (pending surgery), KVO Prophylaxis: SCD (pending surgery)  Disposition: pending surgical debridement and improvement with IV antibiotics. Came from ALF, await PT/OT recs.  History of Present Illness:  Adrian Valdez is a 68 y.o. male presenting with cellulitis with necrosis of left middle finger and right index finger.  PMH is significant for dementia, Waldenstrom macroglobulinemia, NIDDM with neuropathy, HTN, glaucoma, BPH, B12 deficiency, MDD.  Patient is a poor historian due to underlying dementia.  Most of the history was obtained by chart review.  Patient did report history of skin changes to his left middle finger approximately 2 months ago and subsequently his right index finger soon after.  He says he was not seen by a medical provider prior to today.  Daughter reported a history of a previous infection the last few months.  Fingers noted noted to be purple in color with fluid-filled blisters prompting patient to go to the [facility] ED. Upon arrival, patient  was started on IV fluids and blood cultures were obtained.  Orthopedic surgery was consulted and recommended transfer for  I&D due to complexity of case and likely nonviable tissue.  There did not seem to be any signs of osteomyelitis on the plain films, however there was concern for possible invasion into the flexor tendons.  Patient was transported by EMS to [facility] ED.  Vitals remained stable and patient continued IV vancomycin.  Plan was discussed with patient's son and sister-in-law who were initially frustrated with the need for transfer and visitation limitations.  Dr. Tanja Port plans to perform surgical debridement of affected digits.  Resident at [place]. Does not drive. Has brother lives in [place]. No children. Non-smoker. Denies illicit drug or EtOH use.  Review Of Systems: See HPI for pertinent.  Review of Systems  Constitutional: Negative for chills, fever and malaise/fatigue.  HENT: Negative for congestion and sore throat.   Eyes: Negative for blurred vision and double vision.  Respiratory: Negative for cough and sputum production.   Cardiovascular: Negative for chest pain and palpitations.  Gastrointestinal: Negative for nausea and vomiting.  Genitourinary: Negative for dysuria and urgency.  Musculoskeletal: Positive for joint pain. Negative for falls and myalgias.  Skin: Positive for rash. Negative for itching.  Neurological: Negative for dizziness and headaches.    Patient Active Problem List   Diagnosis Date Noted  . Cellulitis 01/03/2019  . Cellulitis of left hand 10/30/2018  . Encounter for antineoplastic immunotherapy 11/20/2016  . Encounter for antineoplastic chemotherapy 10/30/2016  . Altered mental status 09/22/2016  . Waldenstrom's macroglobulinemia ([place]) 09/01/2016  . Enlarged prostate with urinary obstruction 08/16/2016  . Prostate cancer screening 07/19/2016  . Right Non-Complex Renal Cyst 07/19/2016  . Dysuria 11/30/2015  . Urgency of urination 11/30/2015  . Nocturia 11/30/2015  . 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 B12 deficiency 07/27/2015  . IgM  monoclonal gammopathy of uncertain significance 07/24/2015  . Confusion state 07/07/2015  . BP (high blood pressure) 07/07/2015  . Major depressive disorder, single episode, moderate ([place]) 07/07/2015  . FOM (frequency of micturition) 07/07/2015    Past Medical History: Past Medical History:  Diagnosis Date  . 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 B12 deficiency 07/27/2015  . Dementia ([place])   . Diabetes mellitus, type 2 ([place])   . Enlarged prostate   . Glaucoma   . Glaucoma   . Hypertension   . Neuropathy    feet and legs  . Urinary frequency   . Vitamin B 12 deficiency   . Waldenstrom macroglobulinemia ([place])   . Waldenstrom's macroglobulinemia [facility])     Past Surgical History: Past Surgical History:  Procedure Laterality Date  . CATARACT EXTRACTION W/ INTRAOCULAR LENS IMPLANT    . COLONOSCOPY    . KNEE ARTHROSCOPY Right   . LASIK    . PHOTOCOAGULATION WITH LASER Right 05/24/2017   Procedure: PHOTOCOAGULATION WITH LASER;  Surgeon: Leandrew Koyanagi, MD;  Location: [facility];  Service: Ophthalmology;  Laterality: Right;  Diabetic - oral meds    Social History: Social History   Tobacco Use  . Smoking status: Never Smoker  . Smokeless tobacco: Never Used  Substance Use Topics  . Alcohol use: Yes    Comment: social drinker wine couple of times a year  . Drug use: No    Family History: Family History  Problem Relation Age of Onset  . Hypertension Mother   . Hypertension Father   . CAD Father   . Breast cancer  Maternal Grandmother   . Ovarian cancer Paternal Grandmother   . Kidney disease Neg Hx   . Prostate cancer Neg Hx     Allergies and Medications: Allergies  Allergen Reactions  . Oxybutynin Other (See Comments)    Headache  . Myrbetriq [Mirabegron] Other (See Comments)    Other reaction(s): Other (See Comments) migraine Hallucinations     No current facility-administered medications on file prior to encounter.    Current  Outpatient Medications on File Prior to Encounter  Medication Sig Dispense Refill  . acetaminophen (TYLENOL) 650 MG CR tablet Take 650 mg by mouth every 6 (six) hours as needed for pain.    Marland Kitchen diclofenac sodium (VOLTAREN) 1 % GEL Apply 2 g topically every 4 (four) hours as needed for pain.    Marland Kitchen donepezil (ARICEPT) 10 MG tablet Take 10 mg by mouth at bedtime.     . finasteride (PROSCAR) 5 MG tablet Take 1 tablet (5 mg total) by mouth daily. Reported on 11/30/2015 90 tablet 3  . fluticasone (FLONASE) 50 MCG/ACT nasal spray Place 2 sprays into both nostrils daily.     Marland Kitchen gabapentin (NEURONTIN) 300 MG capsule Take 300 mg by mouth 3 (three) times daily.    Marland Kitchen glimepiride (AMARYL) 2 MG tablet Take 2 mg by mouth 2 (two) times daily.    Marland Kitchen guaifenesin (HUMIBID E) 400 MG TABS tablet Take 400 mg by mouth every 6 (six) hours as needed (cough/congestion).    Marland Kitchen HYDROcodone-acetaminophen (NORCO/VICODIN) 5-325 MG tablet Take 1 tablet by mouth every 6 (six) hours as needed for moderate pain.    Marland Kitchen lisinopril (PRINIVIL,ZESTRIL) 20 MG tablet Take 20 mg by mouth daily. Reported on 11/30/2015    . meloxicam (MOBIC) 15 MG tablet Take 15 mg by mouth daily.    . memantine (NAMENDA) 10 MG tablet Take 10 mg by mouth 2 (two) times daily.   3  . methazolamide (NEPTAZANE) 50 MG tablet Take 50 mg by mouth daily.   1  . mupirocin ointment (BACTROBAN) 2 % Apply 1 application topically 3 (three) times daily.     . QUEtiapine (SEROQUEL) 25 MG tablet Take 25 mg by mouth 3 (three) times daily.    . sertraline (ZOLOFT) 50 MG tablet Take 50 mg by mouth daily.     . tamsulosin (FLOMAX) 0.4 MG CAPS capsule Take 0.4 mg by mouth daily.     . timolol (BETIMOL) 0.5 % ophthalmic solution Place 1 drop into both eyes 2 (two) times daily.     . Travoprost, BAK Free, (TRAVATAN) 0.004 % SOLN ophthalmic solution Place 1 drop into both eyes at bedtime.     . triamcinolone cream (KENALOG) 0.1 % Apply 1 application topically 2 (two) times daily.    .  vitamin B-12 (CYANOCOBALAMIN) 1000 MCG tablet Take 1,000 mcg by mouth. One time every month on the 8th of every month      Objective: BP 132/77   Pulse 79   Resp (!) 27   SpO2 98%  Exam: General: elderly male lying in bed staring at hands, A&O to self and situation (unsure where he is at or year), NAD with non-toxic appearance HEENT: normocephalic, atraumatic, moist mucous membranes Cardiovascular: regular rate and rhythm without murmurs, rubs, or gallops Lungs: clear to auscultation bilaterally with normal work of breathing on room air Abdomen: soft, non-tender, non-distended, normoactive bowel sounds Skin: necrosis of left middle finger and right index finger with violaceous borders and pustules with clear discharge, right forearm  with erythematous tracking, erythema and necrosis spares palmar surface bilaterally Extremities: warm and well perfused, normal tone, there is some limitations to passive flexion of affected digits due to edema Neuro: grossly intact throughout, resting tremor of upper extremities bilaterally Psych: pleasantly demented             Labs and Imaging: CBC BMET  Recent Labs  Lab 01/03/19 1333  WBC 9.4  HGB 12.4*  HCT 39.8  PLT 181   Recent Labs  Lab 01/03/19 1342  NA 140  K 3.4*  CL 103  CO2 28  BUN 36*  CREATININE 1.23  GLUCOSE 97  CALCIUM 8.6*     Lactic acid: 0.5 Blood cultures: Pending A1c: Pending HIV antibody: Pending  PORTABLE CHEST 1 VIEW IMPRESSION: Negative for acute cardiopulmonary disease  RIGHT HAND - COMPLETE 3+ VIEW; LEFT HAND - COMPLETE 3+ VIEW IMPRESSION: 1. Soft tissue swelling and irregularity of the right index finger. 2. No acute osseous abnormality in either hand.  RIGHT HAND - COMPLETE 3+ VIEW; LEFT HAND - COMPLETE 3+ VIEW IMPRESSION: 1. Soft tissue swelling and irregularity of the right index finger. 2. No acute osseous abnormality in either hand.    [place] Bing, DO 01/03/2019, 8:55  PM PGY-3, [facility] pager: [phone], text pages welcome

## 2019-01-04 ENCOUNTER — Other Ambulatory Visit: Payer: Self-pay

## 2019-01-04 ENCOUNTER — Encounter (HOSPITAL_COMMUNITY): Payer: Self-pay

## 2019-01-04 DIAGNOSIS — L03119 Cellulitis of unspecified part of limb: Secondary | ICD-10-CM | POA: Diagnosis not present

## 2019-01-04 LAB — BASIC METABOLIC PANEL
Anion gap: 12 (ref 5–15)
BUN: 22 mg/dL (ref 8–23)
CO2: 22 mmol/L (ref 22–32)
Calcium: 8.3 mg/dL — ABNORMAL LOW (ref 8.9–10.3)
Chloride: 105 mmol/L (ref 98–111)
Creatinine, Ser: 0.96 mg/dL (ref 0.61–1.24)
GFR calc Af Amer: >60 mL/min (ref >60)
GFR calc non Af Amer: >60 mL/min (ref >60)
Glucose, Bld: 171 mg/dL — ABNORMAL HIGH (ref 70–99)
Potassium: 3.4 mmol/L — ABNORMAL LOW (ref 3.5–5.1)
Sodium: 139 mmol/L (ref 135–145)

## 2019-01-04 LAB — CBC
HCT: 39.2 % (ref 39.0–52.0)
Hemoglobin: 12.6 g/dL — ABNORMAL LOW (ref 13.0–17.0)
MCH: 30 pg (ref 26.0–34.0)
MCHC: 32.1 g/dL (ref 30.0–36.0)
MCV: 93.3 fL (ref 80.0–100.0)
Platelets: 182 10*3/uL (ref 150–400)
RBC: 4.2 MIL/uL — ABNORMAL LOW (ref 4.22–5.81)
RDW: 13.1 % (ref 11.5–15.5)
WBC: 11.4 10*3/uL — ABNORMAL HIGH (ref 4.0–10.5)
nRBC: 0 % (ref 0.0–0.2)

## 2019-01-04 LAB — GLUCOSE, CAPILLARY
Glucose-Capillary: 272 mg/dL — ABNORMAL HIGH (ref 70–99)
Glucose-Capillary: 275 mg/dL — ABNORMAL HIGH (ref 70–99)
Glucose-Capillary: 223 mg/dL — ABNORMAL HIGH (ref 70–99)
Glucose-Capillary: 237 mg/dL — ABNORMAL HIGH (ref 70–99)

## 2019-01-04 LAB — SURGICAL PCR SCREEN
MRSA, PCR: POSITIVE — AB
Staphylococcus aureus: POSITIVE — AB

## 2019-01-04 LAB — HEMOGLOBIN A1C
Hgb A1c MFr Bld: 6.1 % — ABNORMAL HIGH (ref 4.8–5.6)
Mean Plasma Glucose: 128.37 mg/dL

## 2019-01-04 LAB — HIV ANTIBODY (ROUTINE TESTING W REFLEX): HIV Screen 4th Generation wRfx: NONREACTIVE

## 2019-01-04 MED ORDER — METHAZOLAMIDE 25 MG PO TABS
50.0000 mg | ORAL_TABLET | Freq: Every day | ORAL | Status: DC
  Administered 2019-01-04: 13:00:00 50 mg via ORAL
  Filled 2019-01-04: qty 2
  Filled 2019-01-04: qty 1

## 2019-01-04 MED ORDER — METHOCARBAMOL 1000 MG/10ML IJ SOLN
500.0000 mg | Freq: Four times a day (QID) | INTRAVENOUS | Status: DC | PRN
  Filled 2019-01-04: qty 5

## 2019-01-04 MED ORDER — HYDROCODONE-ACETAMINOPHEN 5-325 MG PO TABS
1.0000 | ORAL_TABLET | ORAL | Status: DC | PRN
  Administered 2019-01-04: 2 via ORAL
  Filled 2019-01-04: qty 2

## 2019-01-04 MED ORDER — VANCOMYCIN HCL 10 G IV SOLR
1250.0000 mg | INTRAVENOUS | Status: DC
  Filled 2019-01-04: qty 1250

## 2019-01-04 MED ORDER — HYDROCODONE-ACETAMINOPHEN 7.5-325 MG PO TABS: 1.0000 | ORAL_TABLET | ORAL | Status: DC | PRN

## 2019-01-04 MED ORDER — ONDANSETRON HCL 4 MG/2ML IJ SOLN: 4.0000 mg | Freq: Four times a day (QID) | INTRAMUSCULAR | Status: DC | PRN

## 2019-01-04 MED ORDER — DONEPEZIL HCL 10 MG PO TABS: 10.0000 mg | ORAL_TABLET | Freq: Every day | ORAL | Status: DC

## 2019-01-04 MED ORDER — ENOXAPARIN SODIUM 40 MG/0.4ML ~~LOC~~ SOLN
40.0000 mg | SUBCUTANEOUS | Status: DC
  Filled 2019-01-04: qty 0.4

## 2019-01-04 MED ORDER — MORPHINE SULFATE (PF) 2 MG/ML IV SOLN
0.5000 mg | INTRAVENOUS | Status: DC | PRN
  Administered 2019-01-04: 1 mg via INTRAVENOUS
  Filled 2019-01-04: qty 1

## 2019-01-04 MED ORDER — NAPROXEN 250 MG PO TABS
250.0000 mg | ORAL_TABLET | Freq: Two times a day (BID) | ORAL | Status: DC
  Administered 2019-01-04: 250 mg via ORAL
  Filled 2019-01-04: qty 1

## 2019-01-04 MED ORDER — CEPHALEXIN 500 MG PO CAPS: 500.0000 mg | ORAL_CAPSULE | Freq: Four times a day (QID) | ORAL | 0 refills | Status: AC

## 2019-01-04 MED ORDER — SODIUM CHLORIDE 0.9% FLUSH
3.0000 mL | Freq: Two times a day (BID) | INTRAVENOUS | Status: DC
  Administered 2019-01-04: 3 mL via INTRAVENOUS

## 2019-01-04 MED ORDER — METHOCARBAMOL 500 MG PO TABS
500.0000 mg | ORAL_TABLET | Freq: Four times a day (QID) | ORAL | Status: DC | PRN
  Administered 2019-01-04: 500 mg via ORAL
  Filled 2019-01-04: qty 1

## 2019-01-04 MED ORDER — LACTATED RINGERS IV SOLN: INTRAVENOUS | Status: DC

## 2019-01-04 MED ORDER — FINASTERIDE 5 MG PO TABS
5.0000 mg | ORAL_TABLET | Freq: Every day | ORAL | Status: DC
  Administered 2019-01-04: 10:00:00 5 mg via ORAL
  Filled 2019-01-04: qty 1

## 2019-01-04 MED ORDER — VANCOMYCIN HCL 10 G IV SOLR
1500.0000 mg | Freq: Once | INTRAVENOUS | Status: AC
  Administered 2019-01-04: 1500 mg via INTRAVENOUS
  Filled 2019-01-04: qty 1500

## 2019-01-04 MED ORDER — SODIUM CHLORIDE 0.9 % IV SOLN: 250.0000 mL | INTRAVENOUS | Status: DC | PRN

## 2019-01-04 MED ORDER — QUETIAPINE FUMARATE 50 MG PO TABS
25.0000 mg | ORAL_TABLET | Freq: Three times a day (TID) | ORAL | Status: DC
  Administered 2019-01-04 (×2): 25 mg via ORAL
  Filled 2019-01-04 (×2): qty 1

## 2019-01-04 MED ORDER — CEPHALEXIN 500 MG PO CAPS
500.0000 mg | ORAL_CAPSULE | Freq: Four times a day (QID) | ORAL | Status: DC
  Administered 2019-01-04: 500 mg via ORAL
  Filled 2019-01-04: qty 1

## 2019-01-04 MED ORDER — INSULIN ASPART 100 UNIT/ML ~~LOC~~ SOLN
0.0000 [IU] | SUBCUTANEOUS | Status: DC
  Administered 2019-01-04: 3 [IU] via SUBCUTANEOUS
  Administered 2019-01-04 (×2): 5 [IU] via SUBCUTANEOUS
  Administered 2019-01-04: 3 [IU] via SUBCUTANEOUS

## 2019-01-04 MED ORDER — VITAMIN C 500 MG PO TABS
1000.0000 mg | ORAL_TABLET | Freq: Every day | ORAL | Status: DC
  Administered 2019-01-04: 1000 mg via ORAL
  Filled 2019-01-04: qty 2

## 2019-01-04 MED ORDER — TAMSULOSIN HCL 0.4 MG PO CAPS
0.4000 mg | ORAL_CAPSULE | Freq: Every day | ORAL | Status: DC
  Administered 2019-01-04: 0.4 mg via ORAL
  Filled 2019-01-04: qty 1

## 2019-01-04 MED ORDER — ONDANSETRON HCL 4 MG PO TABS: 4.0000 mg | ORAL_TABLET | Freq: Four times a day (QID) | ORAL | Status: DC | PRN

## 2019-01-04 MED ORDER — SERTRALINE HCL 50 MG PO TABS
50.0000 mg | ORAL_TABLET | Freq: Every day | ORAL | Status: DC
  Administered 2019-01-04: 50 mg via ORAL
  Filled 2019-01-04: qty 1

## 2019-01-04 MED ORDER — MEMANTINE HCL 10 MG PO TABS
10.0000 mg | ORAL_TABLET | Freq: Two times a day (BID) | ORAL | Status: DC
  Administered 2019-01-04 (×2): 10 mg via ORAL
  Filled 2019-01-04 (×2): qty 1

## 2019-01-04 MED ORDER — SODIUM CHLORIDE 0.9% FLUSH: 3.0000 mL | INTRAVENOUS | Status: DC | PRN

## 2019-01-04 MED ORDER — ACETAMINOPHEN 325 MG PO TABS
325.0000 mg | ORAL_TABLET | Freq: Four times a day (QID) | ORAL | Status: DC | PRN
  Administered 2019-01-04: 650 mg via ORAL
  Filled 2019-01-04: qty 2

## 2019-01-04 NOTE — Progress Notes (Signed)
Pharmacy Antibiotic Note  Primo Kalep Full is a 68 y.o. male admitted on 01/03/2019 with cellulitis w/ necrosis of finger.  Pharmacy has been consulted for vancomycin dosing.  Now s/p I&D/debridement.  Plan: Vancomycin 1500mg  IV x1, then 1250mg  IV every 24 hours (calc AUC 486, SCr 1.23) Monitor renal function, wound Cx and clinical progression to narrow Vancomycin levels at steady state     Temp (24hrs), Avg:97.8 F (36.6 C), Min:97.5 F (36.4 C), Max:98.9 F (37.2 C)  Recent Labs  Lab 01/03/19 1333 01/03/19 1342  WBC 9.4  --   CREATININE  --  1.23  LATICACIDVEN 0.5  --     Estimated Creatinine Clearance: 68.1 mL/min (by C-G formula based on SCr of 1.23 mg/dL).    Allergies  Allergen Reactions  . Oxybutynin Other (See Comments)    Headache  . Myrbetriq [Mirabegron] Other (See Comments)    Other reaction(s): Other (See Comments) migraine Hallucinations      Antimicrobials this admission: Vanc 5/1>>  Dose adjustments this admission: n/a  Microbiology results: 5/1 wound Cx: pend  Bertis Ruddy, PharmD Clinical Pharmacist Please check AMION for all [facility] numbers 01/04/2019 1:27 AM

## 2019-01-04 NOTE — Discharge Summary (Signed)
[facility] Discharge Summary  Patient name: Adrian Valdez Medical record number: [id number] Date of birth: 1951/09/04 Age: 68 y.o. Gender: male Date of Admission: 01/03/2019  Date of Discharge: 01/04/2019 Admitting Physician: Alveda Reasons, MD  Primary Care Provider: Merlene Laughter, MD Consultants: ortho  Indication for Hospitalization: cellulitis of three digits  Discharge Diagnoses/Problem List:  Cellulitis with necrosis of left third and right second finger Dementia with major depression Diabetes, type II Glaucoma Hypertension Charlcie Cradle macroglobulinemia, lymphoplasmacytic lymphoma BPH  Disposition: Discharge to assisted living facility  Discharge Condition: Stable  Discharge Exam:   General: Alert and cooperative and appears to be in no acute distress.  Sitting up in his chair attended by his nurse tech. HEENT: No appreciable JVD, no lymphadenopathy. Cardio: Normal S1 and S2, no S3 or S4. Rhythm is regular. No murmurs or rubs.   Pulm: Clear to auscultation bilaterally, no crackles, wheezing, or diminished breath sounds. Normal respiratory effort Abdomen: Bowel sounds normal. Abdomen soft and non-tender.  Extremities: No peripheral edema. Warm/ well perfused.  Strong radial pulse.  Right second finger, left third and fourth finger and clean, dry bandages.  No erythema evident beyond bandages. Neuro: Cranial nerves grossly intact  Brief Hospital Course:  Adrian Valdez a 68 y.o.malepresenting withcellulitis with necrosis of left middle finger and right index finger. PMH is significant for dementia, Waldenstrom macroglobulinemia,NIDDM with neuropathy, HTN, glaucoma,BPH, B12 deficiency, MDD  His presentation was remarkable for cellulitis of right and left hand digits without concern for osteomyelitis without concern for sepsis.  He was assessed by orthopedic surgery in the day of admission, 4/30.  there was no concern for  osteomyelitis due to laboratory findings and x-ray images.  He was started on IV vancomycin.  He underwent incision and drainage debridement of the wounds on his right second finger third and fourth finger. The morning following his procedure, he was found to have stable vitals, demonstrated urination, defecation and was out of bed, tolerating meals.  He was transitioned to p.o. Keflex for a total of a 7-day course (5/1-5/8).  He is found to be medically stable and discharged back to his assisted living facility.   Issues for Follow Up:  1. Follow-up with orthopedic surgery in 1 week to ensure appropriate wound healing  2. Referral to wound care placed.  Hydrotherapy to begin 1 to 2 days following hospital discharge. 3. Ensure completion of antibiotic course: Keflex 500 mg every 6 hours for 7 days.   Significant Procedures:  4/30-incision and drainage digital cellulitis with debridement tissue.  No complications.  Significant Labs and Imaging:  Recent Labs  Lab 01/03/19 1333 01/04/19 0208  WBC 9.4 11.4*  HGB 12.4* 12.6*  HCT 39.8 39.2  PLT 181 182   Recent Labs  Lab 01/03/19 1342 01/04/19 0208  NA 140 139  K 3.4* 3.4*  CL 103 105  CO2 28 22  GLUCOSE 97 171*  BUN 36* 22  CREATININE 1.23 0.96  CALCIUM 8.6* 8.3*    Surgical wound cultures growing gram-positive cocci in pairs.  Results/Tests Pending at Time of Discharge:  4/30-surgical wound cultures awaiting speciation and sensitivities.  Discharge Medications:  Allergies as of 01/04/2019      Reactions   Oxybutynin Other (See Comments)   Headache   Myrbetriq [mirabegron] Other (See Comments)   Other reaction(s): Other (See Comments) migraine Hallucinations       Medication List    TAKE these medications   acetaminophen 650 MG  CR tablet Commonly known as:  TYLENOL Take 650 mg by mouth every 6 (six) hours as needed for pain.   cephALEXin 500 MG capsule Commonly known as:  KEFLEX Take 1 capsule (500 mg total) by  mouth every 6 (six) hours for 7 days.   diclofenac sodium 1 % Gel Commonly known as:  VOLTAREN Apply 2 g topically every 4 (four) hours as needed for pain.   donepezil 10 MG tablet Commonly known as:  ARICEPT Take 10 mg by mouth at bedtime.   finasteride 5 MG tablet Commonly known as:  PROSCAR Take 1 tablet (5 mg total) by mouth daily. Reported on 11/30/2015   fluticasone 50 MCG/ACT nasal spray Commonly known as:  FLONASE Place 2 sprays into both nostrils daily.   gabapentin 300 MG capsule Commonly known as:  NEURONTIN Take 300 mg by mouth 3 (three) times daily.   glimepiride 2 MG tablet Commonly known as:  AMARYL Take 2 mg by mouth 2 (two) times daily.   guaifenesin 400 MG Tabs tablet Commonly known as:  HUMIBID E Take 400 mg by mouth every 6 (six) hours as needed (cough/congestion).   HYDROcodone-acetaminophen 5-325 MG tablet Commonly known as:  NORCO/VICODIN Take 1 tablet by mouth every 6 (six) hours as needed for moderate pain.   lisinopril 20 MG tablet Commonly known as:  ZESTRIL Take 20 mg by mouth daily. Reported on 11/30/2015   meloxicam 15 MG tablet Commonly known as:  MOBIC Take 15 mg by mouth daily.   memantine 10 MG tablet Commonly known as:  NAMENDA Take 10 mg by mouth 2 (two) times daily.   methazolamide 50 MG tablet Commonly known as:  NEPTAZANE Take 50 mg by mouth daily.   mupirocin ointment 2 % Commonly known as:  BACTROBAN Apply 1 application topically 3 (three) times daily.   QUEtiapine 25 MG tablet Commonly known as:  SEROQUEL Take 25 mg by mouth 3 (three) times daily.   sertraline 50 MG tablet Commonly known as:  ZOLOFT Take 50 mg by mouth daily.   tamsulosin 0.4 MG Caps capsule Commonly known as:  FLOMAX Take 0.4 mg by mouth daily.   timolol 0.5 % ophthalmic solution Commonly known as:  BETIMOL Place 1 drop into both eyes 2 (two) times daily.   Travoprost (BAK Free) 0.004 % Soln ophthalmic solution Commonly known as:   TRAVATAN Place 1 drop into both eyes at bedtime.   triamcinolone cream 0.1 % Commonly known as:  KENALOG Apply 1 application topically 2 (two) times daily.   vitamin B-12 1000 MCG tablet Commonly known as:  CYANOCOBALAMIN Take 1,000 mcg by mouth. One time every month on the 8th of every month       Discharge Instructions: Please refer to Patient Instructions section of EMR for full details.  Patient was counseled important signs and symptoms that should prompt return to medical care, changes in medications, dietary instructions, activity restrictions, and follow up appointments.   Follow-Up Appointments: Follow-up Information    Call Leanora Cover, MD.   Specialty:  Orthopedic Surgery Contact information: [address] [place] [address] [phone]           Matilde Haymaker, MD 01/04/2019, 3:08 PM PGY-1, [place]

## 2019-01-04 NOTE — Progress Notes (Signed)
Pt not in distress, Pt discharged to facility with belongings via PTAR.

## 2019-01-04 NOTE — Progress Notes (Signed)
Attempted to give report 3x, left callback number.

## 2019-01-04 NOTE — Progress Notes (Deleted)
PT Cancellation Note  Patient Details Name: Sylus Stgermain MRN: [id number] DOB: 1951-03-11   Cancelled Treatment:    Reason Eval/Treat Not Completed: PT screened, no needs identified, will sign off. Per OT, pt functioning near baseline mobility, ambulatory in room with assist from safety sitter. Pt from memory care unit at SNF/ALF. Please reconsult if new needs arise.  Mabeline Caras, PT, DPT Acute Rehabilitation Services  Pager [phone] Office [place] 01/04/2019, 12:09 PM

## 2019-01-04 NOTE — Plan of Care (Signed)
  Problem: Education: Goal: Knowledge of General Education information will improve Description Including pain rating scale, medication(s)/side effects and non-pharmacologic comfort measures Outcome: Progressing   Problem: Clinical Measurements: Goal: Ability to maintain clinical measurements within normal limits will improve Outcome: Progressing Goal: Cardiovascular complication will be avoided Outcome: Progressing   Problem: Activity: Goal: Risk for activity intolerance will decrease Outcome: Progressing   Problem: Nutrition: Goal: Adequate nutrition will be maintained Outcome: Progressing   Problem: Coping: Goal: Level of anxiety will decrease Outcome: Progressing   Problem: Elimination: Goal: Will not experience complications related to bowel motility Outcome: Progressing Goal: Will not experience complications related to urinary retention Outcome: Progressing   Problem: Pain Managment: Goal: General experience of comfort will improve Outcome: Progressing   Problem: Safety: Goal: Ability to remain free from injury will improve Outcome: Progressing   Problem: Skin Integrity: Goal: Risk for impaired skin integrity will decrease Outcome: Progressing

## 2019-01-04 NOTE — Anesthesia Postprocedure Evaluation (Signed)
Anesthesia Post Note  Patient: Adrian Valdez  Procedure(s) Performed: IRRIGATION AND DEBRIDEMENT right index finger, and left middle finger (Bilateral Hand)     Patient location during evaluation: PACU Anesthesia Type: General Level of consciousness: awake Pain management: pain level controlled Vital Signs Assessment: post-procedure vital signs reviewed and stable Respiratory status: spontaneous breathing Cardiovascular status: stable Postop Assessment: no apparent nausea or vomiting Anesthetic complications: no    Last Vitals:  Vitals:   01/04/19 0015 01/04/19 0026  BP:  (!) 143/86  Pulse: 92 90  Resp: 16 18  Temp:    SpO2: 99% 96%    Last Pain:  Vitals:   01/04/19 0026  PainSc: Asleep                 Cheral Cappucci

## 2019-01-04 NOTE — TOC Initial Note (Addendum)
Transition of Care [facility]) - Initial/Assessment Note    Patient Details  Name: Adrian Valdez MRN: [id number] Date of Birth: 19-Jan-1951  Transition of Care [facility]) CM/SW Contact:    Candie Chroman, LCSW Phone Number: 01/04/2019, 12:03 PM  Clinical Narrative: Patient not fully oriented. Called brother, introduced role, and explained that discharge planning would be discussed. Patient's brother confirmed he is from [facility] ALF but unsure if he will return at discharge. Brother concerned he is not being properly cared for at this facility. Discussed if he wants ALF vs. SNF and he is unsure. Notified him that PT evaluation is pending and we can wait for their recommendations before proceeding. No further concerns.   1:00 pm: Receive voicemail from MD that he was planning on discharging patient back to ALF today. Called brother to notify and he was concerned he was not ready for discharge. MD will call him. Spoke to [place] at [facility] and she stated they were able to take him back either today or over the weekend if delayed.      2:31 pm: MD spoke to brother and he is agreeable to him discharging back to [facility] today. He is working on having him potentially admit to [facility] at some point in the near future and requested an FL2. Left a voicemail for his contact at the facility to get a fax number so CSW can go ahead and send it over. Left Tiffany at [facility] a voicemail to let her know patient is definitely returning today. Will fax over discharge summary and FL2 once complete.       3:20 pm: Faxed FL2 and discharge summary to [facility]. Left voicemail to let her know I had done so and to call whether changes needed to be made or not.  3:28 pm: Jonelle Sidle has reviewed the documentation faxed over and said no changes needed to be made. Resident will have attending MD cosign discharge summary and FL2. Will fax when complete and arrange transport.  Expected Discharge Plan:  Assisted Living Barriers to Discharge: Continued Medical Work up   Patient Goals and CMS Choice Patient states their goals for this hospitalization and ongoing recovery are:: Patient not fully oriented.      Expected Discharge Plan and Services Expected Discharge Plan: Assisted Living       Living arrangements for the past 2 months: [place]                                      Prior Living Arrangements/Services Living arrangements for the past 2 months: [place] Lives with:: Facility Resident Patient language and need for interpreter reviewed:: No Do you feel safe going back to the place where you live?: No   Brother concerned he is not being properly cared for.  Need for Family Participation in Patient Care: Yes (Comment) Care giver support system in place?: No (comment)(Disposition destination unknown.)   Criminal Activity/Legal Involvement Pertinent to Current Situation/Hospitalization: No - Comment as needed  Activities of Daily Living Home Assistive Devices/Equipment: CBG Meter ADL Screening (condition at time of admission) Patient's cognitive ability adequate to safely complete daily activities?: No Is the patient deaf or have difficulty hearing?: No Does the patient have difficulty seeing, even when wearing glasses/contacts?: No Does the patient have difficulty concentrating, remembering, or making decisions?: Yes Patient able to express need for assistance with ADLs?: No Does  the patient have difficulty dressing or bathing?: Yes Independently performs ADLs?: No Communication: Needs assistance Is this a change from baseline?: Pre-admission baseline Dressing (OT): Needs assistance Is this a change from baseline?: Pre-admission baseline Grooming: Needs assistance Is this a change from baseline?: Pre-admission baseline Feeding: Needs assistance Is this a change from baseline?: Change from baseline, expected to last >3  days Bathing: Needs assistance Is this a change from baseline?: Pre-admission baseline Toileting: Needs assistance Is this a change from baseline?: Pre-admission baseline In/Out Bed: Needs assistance Is this a change from baseline?: Pre-admission baseline Walks in Home: Needs assistance Is this a change from baseline?: Pre-admission baseline Does the patient have difficulty walking or climbing stairs?: Yes Weakness of Legs: Both Weakness of Arms/Hands: Both  Permission Sought/Granted Permission sought to share information with : Facility Sport and exercise psychologist, Family Supports Permission granted to share information with : (Patient not fully oriented.)  Share Information with NAME: Linden Tagliaferro     Permission granted to share info w Relationship: Brother/HCPOA  Permission granted to share info w Contact Information: [phone]  Emotional Assessment Appearance:: Appears stated age Attitude/Demeanor/Rapport: Unable to Assess Affect (typically observed): Unable to Assess Orientation: : Oriented to Self Alcohol / Substance Use: Never Used Psych Involvement: No (comment)  Admission diagnosis:  Cellulitis of hand [B26.203] Patient Active Problem List   Diagnosis Date Noted  . Cellulitis 01/03/2019  . Necrosis of finger ([place]) 01/03/2019  . Cellulitis of left hand 10/30/2018  . Encounter for antineoplastic immunotherapy 11/20/2016  . Encounter for antineoplastic chemotherapy 10/30/2016  . Altered mental status 09/22/2016  . Waldenstrom's macroglobulinemia ([place]) 09/01/2016  . Enlarged prostate with urinary obstruction 08/16/2016  . Prostate cancer screening 07/19/2016  . Right Non-Complex Renal Cyst 07/19/2016  . Dysuria 11/30/2015  . Urgency of urination 11/30/2015  . Nocturia 11/30/2015  . Ascending aorta enlargement ([place]) 08/18/2015  . B12 deficiency 07/27/2015  . 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 Confusion state 07/07/2015  . BP (high blood  pressure) 07/07/2015  . Major depressive disorder, single episode, moderate ([place]) 07/07/2015  . FOM (frequency of micturition) 07/07/2015   PCP:  Merlene Laughter, MD Pharmacy:   [facility] [phone] [address], [place] [address] Phone: [phone] Fax: [phone]     Social Determinants of Health (SDOH) Interventions    Readmission Risk Interventions No flowsheet data found.

## 2019-01-04 NOTE — Progress Notes (Signed)
Inpatient Diabetes Program Recommendations  AACE/ADA: New Consensus Statement on Inpatient Glycemic Control (2015)  Target Ranges:  Prepandial:   less than 140 mg/dL      Peak postprandial:   less than 180 mg/dL (1-2 hours)      Critically ill patients:  140 - 180 mg/dL   Results for MIRAJ, TRUSS" (MRN [id number]) as of 01/04/2019 14:20  Ref. Range 01/03/2019 23:58 01/04/2019 04:29 01/04/2019 07:36 01/04/2019 12:16  Glucose-Capillary Latest Ref Range: 70 - 99 mg/dL 101 (H) 272 (H) 275 (H) 223 (H)    Review of Glycemic Control  Diabetes history: DM 2 Outpatient Diabetes medications: Glimepiride 2 mg BID Current orders for Inpatient glycemic control: Novolog 0-9 units Q4 hours  A1c 6.1% on 5/1  Inpatient Diabetes Program Recommendations:    Renal function normal Increase correction scale to Novolog 0-15 units Q4 hours.   Thanks,  Tama Headings RN, MSN, BC-ADM Inpatient Diabetes Coordinator Team Pager [phone] (8a-5p)

## 2019-01-04 NOTE — Progress Notes (Signed)
Status post incision and drainage of right index finger including felon and debridement of left long finger and incision and drainage of left small finger for paronychia.  Cultures taken of right index finger.  Recommend IV antibiotics.  Start hydrotherapy in 2 to 4 days.  Okay for discharge from hand standpoint when afebrile, normal white blood count, erythema and streaking resolving.  Can perform wound care in office after discharge.

## 2019-01-04 NOTE — Progress Notes (Signed)
Occupational Therapy Evaluation Patient Details Name: Adrian Valdez MRN: [id number] DOB: Aug 24, 1951 Today's Date: 01/04/2019    History of Present Illness Pt is 68 y.o. male presenting with cellulitis with necrosis of left middle finger and right index finger.  PMH is significant for dementia, Waldenstrom macroglobulinemia, NIDDM with neuropathy, HTN, glaucoma, BPH, B12 deficiency, MDD. Pt now s/p the following procedure: right index finger incision and drainage including felon, left ring finger removal of nail plate and incision of paronychia, left long finger debridement of epidermis and nailbed approximately 1 x 2 cm.   Clinical Impression   Pt admitted with above diagnosis. PTA, pt is from memory care unit in ALF (per chart review).  Pt unable to provide history to therapist due to impaired cognition. His sitter was present in room and reports he has been feeding himself without difficulty this morning, using both hands. Sitter also reports he has been up ambulating to bathroom with varying supervision - min assist levels (assist due to cognitive deficits). Therapist completed education with sitter regarding elevation and ROM (shoulders/elbow) to assist with decreasing swelling. Encourage pt to continue active participation in ADLs. No further acute OT needs at this time. Will sign off at this time. Please re-order if patient experiences a decline in function.    Follow Up Recommendations  Supervision/Assistance - 24 hour(return to ALF)    Equipment Recommendations  None recommended by OT    Recommendations for Other Services       Precautions / Restrictions Precautions Precautions: Fall      Mobility Bed Mobility                  Transfers Overall transfer level: (did not assess)                    Balance                                           ADL either performed or assessed with clinical judgement   ADL Overall ADL's : Needs  assistance/impaired Eating/Feeding: Independent   Grooming: Supervision/safety;Wash/dry hands;Wash/dry face;Bed level                                 General ADL Comments: Sitter reports has been ambulating to bathroom today and completing ADLs with varying levels of supervision to min assist (assist due to impaired cognition).     Vision         Perception     Praxis      Pertinent Vitals/Pain Pain Assessment: Faces Faces Pain Scale: Hurts a little bit Pain Location: bilateral hands Pain Descriptors / Indicators: Operative site guarding Pain Intervention(s): Monitored during session;Repositioned     Hand Dominance Right   Extremity/Trunk Assessment Upper Extremity Assessment Upper Extremity Assessment: RUE deficits/detail;LUE deficits/detail RUE Deficits / Details: Shoulder and elbow ROM WFL. Difficult to assess finger ROM due to dressing. LUE Deficits / Details: Shoulder and elbow ROM WFL. Difficult to assess finger ROM due to dressing.   Lower Extremity Assessment Lower Extremity Assessment: Overall WFL for tasks assessed       Communication     Cognition Arousal/Alertness: Awake/alert Behavior During Therapy: WFL for tasks assessed/performed Overall Cognitive Status: History of cognitive impairments - at baseline  General Comments: pt diagnosed with dementia and lives in memory care unit   General Comments       Exercises     Shoulder Instructions      Home Living Family/patient expects to be discharged to:: Assisted living                                 Additional Comments: memory care unit at [facility]      Prior Functioning/Environment          Comments: from ALF memory care unit, pt unable to report baseline function        OT Problem List: Pain;Impaired UE functional use      OT Treatment/Interventions:      OT Goals(Current goals can be found in the care plan  section)    OT Frequency:     Barriers to D/C:            Co-evaluation              AM-PAC OT "6 Clicks" Daily Activity     Outcome Measure Help from another person eating meals?: None Help from another person taking care of personal grooming?: None Help from another person toileting, which includes using toliet, bedpan, or urinal?: A Little Help from another person bathing (including washing, rinsing, drying)?: A Little Help from another person to put on and taking off regular upper body clothing?: A Little Help from another person to put on and taking off regular lower body clothing?: A Little 6 Click Score: 20   End of Session Nurse Communication: (educated sitter on ROM and elevation)  Activity Tolerance: Patient tolerated treatment well Patient left: in bed;with call bell/phone within reach;with nursing/sitter in room  OT Visit Diagnosis: Pain Pain - Right/Left: (bilateral) Pain - part of body: Hand                Time: 3428-7681 OT Time Calculation (min): 10 min Charges:  OT General Charges $OT Visit: 1 Visit OT Evaluation $OT Eval Low Complexity: 1 Low   Darrol Jump OTR/L [place] [phone] 01/04/2019, 10:07 AM

## 2019-01-04 NOTE — Discharge Instructions (Signed)

## 2019-01-04 NOTE — Progress Notes (Signed)
Family Medicine Teaching Service Daily Progress Note Intern Pager: [phone]  Patient name: Adrian Valdez Medical record number: [id number] Date of birth: 07-26-1951 Age: 67 y.o. Gender: male  Primary Care Provider: Merlene Laughter, MD Consultants: Ortho Code Status: Full  Pt Overview and Major Events to Date:  4/30 - admitted for cellulitis and necrosis of left third finger  Assessment and Plan: Adrian Valdez is a 68 y.o. male presenting with cellulitis with necrosis of left middle finger and right index finger.  PMH is significant for dementia, Waldenstrom macroglobulinemia, NIDDM with neuropathy, HTN, glaucoma, BPH, B12 deficiency, MDD  Cellulitis with necrosis of left third and right second finger Now status post incision and drainage of right second finger and left third and fourth finger by orthopedics.  Ortho recommending IV antibiotics and hydrotherapy for 2 to 4 days.  Ortho recommends discharge once afebrile with normal WBC and resolved streaking on physical exam.  Vitals within normal limits following operation.  No edema noted on exam this morning, WBC 11.4 this a.m. - Monitor physical exam/healing - Continue vancomycin - Follow-up blood cultures - Tylenol - Naproxen - Norco 5 as needed every 4 hours for mild pain - Norco 7.5 as needed every 4 hours for severe pain  Dementia with MDD Is a resident at the memory unit at [facility]. -Sertraline 50 mg daily -Memantine 10 mg twice daily -Donepezil 10 mg nightly -Quetiapine 25 mg 3 times daily -Holding gabapentin  Diabetes, type II A1c 6.1 this morning.  Glucose 171 on BMP this a.m.  Holding home glimepiride. -Continue SSI sensitive  Glaucoma, chronic, stable -methazolamide 50 mg daily  Hypertension Stable from admission so far.  Holding home medication. -Continue to monitor  Waldenstrm macroglobulinemia, lymphoplasmacytic lymphoma Following with oncology. -Follow-up  outpatient  BPH -Finasteride -Tamsulosin  FEN/GI: Carb modified PPx: Lovenox  Disposition: 1 to 2 days of hospitalization anticipated before discharge  Subjective:  Pleasant and interactive this morning.  No complaints during our interview.  Specifically denied any pain from his recent surgery, headache, chest pain, trouble breathing, nausea.  He reported that he had been up recently to urinate and stool.  Objective: Temp:  [97.5 F (36.4 C)-99.3 F (37.4 C)] 99.3 F (37.4 C) (05/01 0431) Pulse Rate:  [58-110] 110 (05/01 0431) Resp:  [15-27] 18 (05/01 0431) BP: (99-149)/(69-95) 120/82 (05/01 0431) SpO2:  [92 %-100 %] 93 % (05/01 0431) Weight:  [90 kg-90.7 kg] 90 kg (05/01 0500)  General: Alert and cooperative and appears to be in no acute distress.  Sitting up in his chair attended by his nurse tech. HEENT: No appreciable JVD, no lymphadenopathy. Cardio: Normal S1 and S2, no S3 or S4. Rhythm is regular. No murmurs or rubs.   Pulm: Clear to auscultation bilaterally, no crackles, wheezing, or diminished breath sounds. Normal respiratory effort Abdomen: Bowel sounds normal. Abdomen soft and non-tender.  Extremities: No peripheral edema. Warm/ well perfused.  Strong radial pulse.  Right second finger, left third and fourth finger and clean, dry bandages.  No erythema evident beyond bandages. Neuro: Cranial nerves grossly intact   Laboratory: Recent Labs  Lab 01/03/19 1333 01/04/19 0208  WBC 9.4 11.4*  HGB 12.4* 12.6*  HCT 39.8 39.2  PLT 181 182   Recent Labs  Lab 01/03/19 1342 01/04/19 0208  NA 140 139  K 3.4* 3.4*  CL 103 105  CO2 28 22  BUN 36* 22  CREATININE 1.23 0.96  CALCIUM 8.6* 8.3*  GLUCOSE 97 171*  Imaging/Diagnostic Tests: Dg Chest Port 1 View  Result Date: 01/03/2019 CLINICAL DATA:  68 year old male with cellulitis EXAM: PORTABLE CHEST 1 VIEW COMPARISON:  09/11/2018 FINDINGS: Cardiomediastinal silhouette unchanged. As symmetric elevation the right  hemidiaphragm with linear scarring/atelectasis at the right lung base. No pleural effusion or confluent airspace disease. Similar appearance of chronic interstitial opacities. No pneumothorax. No displaced fracture IMPRESSION: Negative for acute cardiopulmonary disease Electronically Signed   By: Corrie Mckusick D.O.   On: 01/03/2019 20:55   Dg Hand Complete Left  Result Date: 01/03/2019 CLINICAL DATA:  Bilateral hand redness and swelling. History of Waldenstrom's macroglobulinemia. EXAM: RIGHT HAND - COMPLETE 3+ VIEW; LEFT HAND - COMPLETE 3+ VIEW COMPARISON:  Left hand x-rays dated October 30, 2018. FINDINGS: Left hand: No acute fracture or dislocation. Joint spaces are preserved. Bone mineralization is normal. Soft tissues are unremarkable. Right hand: No acute fracture or dislocation. Joint spaces are preserved. Bone mineralization is normal. Soft tissue swelling of the index finger with dorsal irregularity distal to the PIP joint. Mild dorsal hand soft tissue swelling. IMPRESSION: 1. Soft tissue swelling and irregularity of the right index finger. 2. No acute osseous abnormality in either hand. Electronically Signed   By: Titus Dubin M.D.   On: 01/03/2019 13:37   Dg Hand Complete Right  Result Date: 01/03/2019 CLINICAL DATA:  Bilateral hand redness and swelling. History of Waldenstrom's macroglobulinemia. EXAM: RIGHT HAND - COMPLETE 3+ VIEW; LEFT HAND - COMPLETE 3+ VIEW COMPARISON:  Left hand x-rays dated October 30, 2018. FINDINGS: Left hand: No acute fracture or dislocation. Joint spaces are preserved. Bone mineralization is normal. Soft tissues are unremarkable. Right hand: No acute fracture or dislocation. Joint spaces are preserved. Bone mineralization is normal. Soft tissue swelling of the index finger with dorsal irregularity distal to the PIP joint. Mild dorsal hand soft tissue swelling. IMPRESSION: 1. Soft tissue swelling and irregularity of the right index finger. 2. No acute osseous  abnormality in either hand. Electronically Signed   By: Titus Dubin M.D.   On: 01/03/2019 13:37     Matilde Haymaker, MD 01/04/2019, 6:26 AM PGY-1, [place] Intern pager: [phone], text pages welcome

## 2019-01-04 NOTE — Plan of Care (Signed)
  Problem: Education: Goal: Knowledge of General Education information will improve Description: Including pain rating scale, medication(s)/side effects and non-pharmacologic comfort measures Outcome: Progressing   Problem: Pain Managment: Goal: General experience of comfort will improve Outcome: Progressing   Problem: Safety: Goal: Ability to remain free from injury will improve Outcome: Progressing   

## 2019-01-04 NOTE — Progress Notes (Signed)
Attempted to give report, reached voicemail, left callback number.

## 2019-01-04 NOTE — TOC Transition Note (Signed)
Transition of Care [facility]) - CM/SW Discharge Note   Patient Details  Name: Adrian Valdez MRN: [id number] Date of Birth: [birth year]  [facility] CM/SW Contact:  Candie Chroman, LCSW Phone Number: 01/04/2019, 3:52 PM   Clinical Narrative: CSW facilitated patient discharge including contacting patient family and facility to confirm patient discharge plans. Clinical information faxed to facility and family agreeable with plan. CSW arranged ambulance transport via [facility] to [place]. RN to call report prior to discharge [phone]).  CSW will sign off for now as social work intervention is no longer needed. Please consult [place] again if new needs arise.  Final next level of care: Memory Care Barriers to Discharge: Barriers Resolved   Patient Goals and CMS Choice Patient states their goals for this hospitalization and ongoing recovery are:: Patient not fully oriented.      Discharge Placement                Patient to be transferred to facility by: [facility] Name of family member notified: Jontez Redfield Patient and family notified of of transfer: 01/04/19  Discharge Plan and Services                                     Social Determinants of Health (SDOH) Interventions     Readmission Risk Interventions No flowsheet data found.

## 2019-01-04 NOTE — Op Note (Addendum)
NAME: Adrian Valdez MEDICAL RECORD NO: [id number] DATE OF BIRTH: [birth year] FACILITY: [facility] LOCATION: [facility] OR PHYSICIAN: Tennis Must, MD   OPERATIVE REPORT   DATE OF PROCEDURE: 01/04/19    PREOPERATIVE DIAGNOSIS:   Right index finger infection, left long finger infection   POSTOPERATIVE DIAGNOSIS:   Right index finger infection, left long finger infection, left small finger infection   PROCEDURE:   1.  Right index finger incision and drainage including felon 2.  Left small finger removal of nail plate and incision of paronychia 3.  Left long finger debridement of epidermis and nailbed approximately 1 x 2 cm   SURGEON:  Leanora Cover, M.D.   ASSISTANT: none   ANESTHESIA:  General   INTRAVENOUS FLUIDS:  Per anesthesia flow sheet.   ESTIMATED BLOOD LOSS:  Minimal.   COMPLICATIONS:  None.   SPECIMENS:   Cultures from right index finger to micro   TOURNIQUET TIME:    Total Tourniquet Time Documented: Upper Arm (Right) - 28 minutes Total: Upper Arm (Right) - 28 minutes    DISPOSITION:  Stable to PACU.   INDICATIONS: 68 year old male resident of assisted living facility with infection of right index finger with progressive swelling and erythema with streaking up into hand and forearm.  He also had a similar problem of the left long finger approximately 2 months ago treated with IV antibiotics and resolution of the seems to be recurring.  He is also noted to have redness at the left small finger dorsal nail fold.   I recommended incision and drainage of bilateral hands as necessary.  Risks, benefits and alternatives of surgery were discussed including the risks of blood loss, infection, damage to nerves, vessels, tendons, ligaments, bone for surgery, need for additional surgery, complications with wound healing, continued pain, stiffness, need for repeat irrigation debridement, amputation.  He voiced understanding of these risks and elected to proceed.  This was discussed  with his brother, who is POA, over the phone and he agreed with the plan of care.  Telephone consent was obtained.  OPERATIVE COURSE:  After being identified preoperatively by myself,  the patient and I agreed on the procedure and site of the procedure.  The surgical site was marked.  Surgical consent had been signed. He was given IV antibiotics as preoperative antibiotic prophylaxis. He was transferred to the operating room and placed on the operating table in supine position with the Bilateral upper extremity on an arm board.  General anesthesia was induced by the anesthesiologist.  Bilateral upper extremity was prepped and draped in normal sterile orthopedic fashion.  A surgical pause was performed between the surgeons, anesthesia, and operating room staff and all were in agreement as to the patient, procedure, and site of procedure.  The right index finger was addressed first. Tourniquet at the proximal aspect of the extremity was inflated to 250 mmHg after exsanguination of the arm with an Esmarch bandage.    The epidermis was debrided sharply with the scissors.  There was thin fluid underneath with bits of debris.  Cultures were taken for aerobes and anaerobes.  There was fluid under the nail.  The nail plate was removed.  There were areas of dermis with punctate bleeding spots and areas of skin that looked more leathery.  Incision was made at the ulnar side of the distal phalanx to drain the felon.  No gross purulence was encountered.  The wounds were copiously irrigated with sterile saline.  Quarter-inch iodoform gauze was placed  into the incision for the pad of the finger.  Xeroform is placed the nail fold and all wounds dressed with sterile Xeroform and 4 x 4's and wrapped with a Coban dressing lightly.  An AlumaFoam splint was placed and wrapped lightly with Coban dressing.  Tourniquet was deflated at 28 minutes.  Fingertips were pink with brisk capillary refill after deflation of tourniquet.  The  operative  drapes were broken down.    The left upper extremity was then addressed.  The hand was prepped to the wrist.  It was then sterilely draped.  A Penrose drain was used at the long finger.  Again the epidermis on the dorsum of the finger was debrided with the scissors.  There was a thickened cap of dried tissue over the proximal aspect of the nail plate which was removed.  There was a early small nail growing back which came out with it.  There was no gross purulence.  There is no fluctuance.  It was not felt an incision was necessary.  The wound was copiously irrigated with sterile saline.  There was noted to be an adhesion between the dorsal nail roof and the nail plate.  Xeroform was placed in the nail fold and the wound dressed with sterile Xeroform 4 x 4 and wrapped with Coban dressing lightly.  An AlumaFoam splint was placed and wrapped lightly with Coban dressing.  It was also noted that the small finger had erythema at the dorsal nail fold.  This nail plate was removed.  There is a small amount of purulence coming from underneath the cuticle.  Incision was made in the undersurface of the dorsal nail groove to allow drainage.  No gross purulence was encountered after the incision.  The wound was again copiously irrigated with sterile saline.  A piece of iodoform gauze was placed into the incision.  A piece of Xeroform was placed in the nail fold and the wound dressed with sterile Xeroform 4 x 4 and wrapped with a Coban dressing lightly.  An AlumaFoam splint was placed and wrapped lightly with Coban dressing.  A Penrose drain had been used on the small finger as well.  Each of these Penrose drains were out for less than 10 minutes.  The operative drapes were broken down.  The patient was awoken from anesthesia safely.  He was transferred back to the stretcher and taken to PACU in stable condition.  He has been admitted to the hospitalist for IV antibiotics.   Leanora Cover, MD Electronically signed,  01/04/19  Addendum (5/5/2): correct digit in procedure.

## 2019-01-04 NOTE — Care Management (Addendum)
Drew with [facility] requesting resumption of care order for home health RN for "wound care". Paged [phone] . Awaiting call back.Spoke with Dr Higinio Plan she will enter order. Drew aware.  Magdalen Spatz RN [phone]

## 2019-01-04 NOTE — Evaluation (Signed)
Physical Therapy Evaluation & Discharge Patient Details Name: Adrian Valdez MRN: [id number] DOB: 1951-02-01 Today's Date: 01/04/2019   History of Present Illness  Pt is a 68 y.o. male admitted from ALF on 01/03/19 with worsening R index finger and L long finger infection. Now s/p I&D of fingers 5/1. PMH includes dementia, neuropathy, HTN, glaucoma.     Clinical Impression  Patient evaluated by Physical Therapy with no further acute PT needs identified. PTA, pt resides at ALF. Today, pt ambulatory at supervision-min guard level; likely functioning near baseline mobility and cognition. All education has been completed and the patient has no further questions. Acute PT is signing off. Thank you for this referral.    Follow Up Recommendations No PT follow up;Supervision/Assistance - 24 hour(return to ALF)    Equipment Recommendations  None recommended by PT    Recommendations for Other Services       Precautions / Restrictions Precautions Precautions: Fall Restrictions Weight Bearing Restrictions: No      Mobility  Bed Mobility               General bed mobility comments: Received sitting in recliner  Transfers Overall transfer level: Needs assistance Equipment used: None Transfers: Sit to/from Stand Sit to Stand: Supervision         General transfer comment: Reliant on UE support to push into standing  Ambulation/Gait Ambulation/Gait assistance: Supervision;Min guard Gait Distance (Feet): 150 Feet Assistive device: None Gait Pattern/deviations: Step-through pattern;Decreased stride length;Drifts right/left Gait velocity: Decreased Gait velocity interpretation: 1.31 - 2.62 ft/sec, indicative of limited community ambulator General Gait Details: Slow gait without DME, intermittent min guard due to instability but pt able to self-correct; frequent cues for direction as pt easily distracted looking into rooms  Stairs            Wheelchair Mobility     Modified Rankin (Stroke Patients Only)       Balance Overall balance assessment: Needs assistance   Sitting balance-Leahy Scale: Good       Standing balance-Leahy Scale: Good                               Pertinent Vitals/Pain Pain Assessment: Faces Faces Pain Scale: Hurts a little bit Pain Location: bilateral hands Pain Descriptors / Indicators: Guarding Pain Intervention(s): Repositioned    Home Living Family/patient expects to be discharged to:: Assisted living                 Additional Comments: memory care unit at [place]    Prior Function           Comments: from ALF memory care unit, pt unable to report baseline function. Pt reports he does not use DME. Was classical music professor     Hand Dominance   Dominant Hand: Right    Extremity/Trunk Assessment   Upper Extremity Assessment Upper Extremity Assessment: RUE deficits/detail;LUE deficits/detail RUE Deficits / Details: Shoulder and elbow ROM WFL. Difficult to assess finger ROM due to dressing. RUE: Unable to fully assess due to immobilization LUE Deficits / Details: Shoulder and elbow ROM WFL. Difficult to assess finger ROM due to dressing. LUE: Unable to fully assess due to immobilization    Lower Extremity Assessment Lower Extremity Assessment: Overall WFL for tasks assessed       Communication      Cognition Arousal/Alertness: Awake/alert Behavior During Therapy: WFL for tasks assessed/performed Overall Cognitive Status: History of  cognitive impairments - at baseline                                 General Comments: Pt with h/o dementia, resides at memory care unit. Follows simple, one-step commands well although needs frequent cuing      General Comments      Exercises     Assessment/Plan    PT Assessment Patent does not need any further PT services  PT Problem List         PT Treatment Interventions      PT Goals  (Current goals can be found in the Care Plan section)  Acute Rehab PT Goals PT Goal Formulation: All assessment and education complete, DC therapy    Frequency     Barriers to discharge        Co-evaluation               AM-PAC PT "6 Clicks" Mobility  Outcome Measure Help needed turning from your back to your side while in a flat bed without using bedrails?: None Help needed moving from lying on your back to sitting on the side of a flat bed without using bedrails?: None Help needed moving to and from a bed to a chair (including a wheelchair)?: A Little Help needed standing up from a chair using your arms (e.g., wheelchair or bedside chair)?: A Little Help needed to walk in hospital room?: A Little Help needed climbing 3-5 steps with a railing? : A Little 6 Click Score: 20    End of Session Equipment Utilized During Treatment: Gait belt Activity Tolerance: Patient tolerated treatment well Patient left: with call bell/phone within reach;in chair;with nursing/sitter in room Nurse Communication: Mobility status PT Visit Diagnosis: Other abnormalities of gait and mobility (R26.89)    Time: 2025-4270 PT Time Calculation (min) (ACUTE ONLY): 20 min   Charges:   PT Evaluation $PT Eval Moderate Complexity: [place], PT, DPT Acute Rehabilitation Services  Pager [phone] Office [place] 01/04/2019, 3:19 PM

## 2019-01-04 NOTE — NC FL2 (Signed)
[place] LEVEL OF CARE SCREENING TOOL     IDENTIFICATION  Patient Name: Adrian Valdez Birthdate: February 22, 1951 Sex: male Admission Date (Current Location): 01/03/2019  [facility] and [place] Number:  Engineering geologist and Address:  [facility] [place], [place] [address] [place], [place] [address]      Provider Number: [id number]  Attending Physician Name and Address:  Lind Covert, MD  Relative Name and Phone Number:       Current Level of Care: Hospital Recommended Level of Care: [place] Prior Approval Number:    Date Approved/Denied:   PASRR Number:    Discharge Plan: Other (Comment)(ALF)    Current Diagnoses: Patient Active Problem List   Diagnosis Date Noted  . Cellulitis 01/03/2019  . Necrosis of finger ([place]) 01/03/2019  . Cellulitis of left hand 10/30/2018  . Encounter for antineoplastic immunotherapy 11/20/2016  . Encounter for antineoplastic chemotherapy 10/30/2016  . Altered mental status 09/22/2016  . Waldenstrom's macroglobulinemia ([place]) 09/01/2016  . Enlarged prostate with urinary obstruction 08/16/2016  . Prostate cancer screening 07/19/2016  . Right Non-Complex Renal Cyst 07/19/2016  . Dysuria 11/30/2015  . Urgency of urination 11/30/2015  . Nocturia 11/30/2015  . 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 B12 deficiency 07/27/2015  . 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 Confusion state 07/07/2015  . BP (high blood pressure) 07/07/2015  . Major depressive disorder, single episode, moderate ([place]) 07/07/2015  . FOM (frequency of micturition) 07/07/2015    Orientation RESPIRATION BLADDER Height & Weight     Self, Place  Normal Continent Weight: 198 lb 6.6 oz (90 kg) Height:  5\' 11"  (180.3 cm)  BEHAVIORAL SYMPTOMS/MOOD NEUROLOGICAL BOWEL NUTRITION STATUS  Other (Comment)(Calm, Cooperative.) (None) Continent Diet(Carb modified)  AMBULATORY STATUS COMMUNICATION  OF NEEDS Skin     Verbally Surgical wounds                       Personal Care Assistance Level of Assistance  Bathing, Feeding, Dressing Bathing Assistance: Limited assistance Feeding assistance: Independent Dressing Assistance: Limited assistance     Functional Limitations Info  Sight, Hearing, Speech Sight Info: Adequate Hearing Info: Adequate Speech Info: Adequate    SPECIAL CARE FACTORS FREQUENCY                       Contractures Contractures Info: Not present    Additional Factors Info  Code Status, Allergies Code Status Info: Full code Allergies Info: Oxybutynin, Myrbetriq (Mirabegron).           Current Medications (01/04/2019):  This is the current hospital active medication list Current Facility-Administered Medications  Medication Dose Route Frequency Provider Last Rate Last Dose  . 0.9 %  sodium chloride infusion  250 mL Intravenous PRN [place] Bing, DO      . [START ON 01/05/2019] acetaminophen (TYLENOL) tablet 325-650 mg  325-650 mg Oral Q6H PRN Leanora Cover, MD   650 mg at 01/04/19 1321  . cephALEXin (KEFLEX) capsule 500 mg  500 mg Oral Q6H [place] Dawn, MD   500 mg at 01/04/19 1242  . donepezil (ARICEPT) tablet 10 mg  10 mg Oral QHS [place] Bing, DO      . [START ON 01/05/2019] enoxaparin (LOVENOX) injection 40 mg  40 mg Subcutaneous Q24H Leanora Cover, MD      . finasteride (PROSCAR) tablet 5 mg  5 mg Oral Daily [place] Bing, DO  5 mg at 01/04/19 0934  . HYDROcodone-acetaminophen (NORCO) 7.5-325 MG per tablet 1-2 tablet  1-2 tablet Oral Q4H PRN Leanora Cover, MD      . HYDROcodone-acetaminophen (NORCO/VICODIN) 5-325 MG per tablet 1-2 tablet  1-2 tablet Oral Q4H PRN Leanora Cover, MD   2 tablet at 01/04/19 0316  . insulin aspart (novoLOG) injection 0-9 Units  0-9 Units Subcutaneous Q4H [place] Bing, DO   3 Units at 01/04/19 1242  . lactated ringers infusion   Intravenous Continuous Leanora Cover, MD      . memantine [facility])  tablet 10 mg  10 mg Oral BID [place] Bing, DO   10 mg at 01/04/19 0932  . methazolamide (NEPTAZANE) tablet 50 mg  50 mg Oral Daily [place] Bing, DO   50 mg at 01/04/19 1319  . methocarbamol (ROBAXIN) tablet 500 mg  500 mg Oral Q6H PRN Leanora Cover, MD   500 mg at 01/04/19 0315   Or  . methocarbamol (ROBAXIN) 500 mg in dextrose 5 % 50 mL IVPB  500 mg Intravenous Q6H PRN Leanora Cover, MD      . morphine 2 MG/ML injection 0.5-1 mg  0.5-1 mg Intravenous Q2H PRN Leanora Cover, MD   1 mg at 01/04/19 0139  . naproxen (NAPROSYN) tablet 250 mg  250 mg Oral BID [place] Leanora Cover, MD   250 mg at 01/04/19 0933  . ondansetron (ZOFRAN) tablet 4 mg  4 mg Oral Q6H PRN Leanora Cover, MD       Or  . ondansetron [facility]) injection 4 mg  4 mg Intravenous Q6H PRN Leanora Cover, MD      . QUEtiapine (SEROQUEL) tablet 25 mg  25 mg Oral TID [place] Bing, DO   25 mg at 01/04/19 1779  . sertraline (ZOLOFT) tablet 50 mg  50 mg Oral Daily [place] Bing, DO   50 mg at 01/04/19 3903  . sodium chloride flush (NS) 0.9 % injection 3 mL  3 mL Intravenous Q12H [place] Bing, DO   3 mL at 01/04/19 0944  . sodium chloride flush (NS) 0.9 % injection 3 mL  3 mL Intravenous PRN [place] Bing, DO      . tamsulosin [facility]) capsule 0.4 mg  0.4 mg Oral Daily [place] Bing, DO   0.4 mg at 01/04/19 0092  . vitamin C (ASCORBIC ACID) tablet 1,000 mg  1,000 mg Oral Daily Leanora Cover, MD   1,000 mg at 01/04/19 3300     Discharge Medications: TAKE these medications   acetaminophen 650 MG CR tablet Commonly known as:  TYLENOL Take 650 mg by mouth every 6 (six) hours as needed for pain.   cephALEXin 500 MG capsule Commonly known as:  KEFLEX Take 1 capsule (500 mg total) by mouth every 6 (six) hours for 7 days.   diclofenac sodium 1 % Gel Commonly known as:  VOLTAREN Apply 2 g topically every 4 (four) hours as needed for pain.   donepezil 10 MG tablet Commonly known as:  ARICEPT Take 10 mg by mouth at  bedtime.   finasteride 5 MG tablet Commonly known as:  PROSCAR Take 1 tablet (5 mg total) by mouth daily. Reported on 11/30/2015   fluticasone 50 MCG/ACT nasal spray Commonly known as:  FLONASE Place 2 sprays into both nostrils daily.   gabapentin 300 MG capsule Commonly known as:  NEURONTIN Take 300 mg by mouth 3 (three) times daily.   glimepiride 2 MG tablet Commonly known  as:  AMARYL Take 2 mg by mouth 2 (two) times daily.   guaifenesin 400 MG Tabs tablet Commonly known as:  HUMIBID E Take 400 mg by mouth every 6 (six) hours as needed (cough/congestion).   HYDROcodone-acetaminophen 5-325 MG tablet Commonly known as:  NORCO/VICODIN Take 1 tablet by mouth every 6 (six) hours as needed for moderate pain.   lisinopril 20 MG tablet Commonly known as:  ZESTRIL Take 20 mg by mouth daily. Reported on 11/30/2015   meloxicam 15 MG tablet Commonly known as:  MOBIC Take 15 mg by mouth daily.   memantine 10 MG tablet Commonly known as:  NAMENDA Take 10 mg by mouth 2 (two) times daily.   methazolamide 50 MG tablet Commonly known as:  NEPTAZANE Take 50 mg by mouth daily.   mupirocin ointment 2 % Commonly known as:  BACTROBAN Apply 1 application topically 3 (three) times daily.   QUEtiapine 25 MG tablet Commonly known as:  SEROQUEL Take 25 mg by mouth 3 (three) times daily.   sertraline 50 MG tablet Commonly known as:  ZOLOFT Take 50 mg by mouth daily.   tamsulosin 0.4 MG Caps capsule Commonly known as:  FLOMAX Take 0.4 mg by mouth daily.   timolol 0.5 % ophthalmic solution Commonly known as:  BETIMOL Place 1 drop into both eyes 2 (two) times daily.   Travoprost (BAK Free) 0.004 % Soln ophthalmic solution Commonly known as:  TRAVATAN Place 1 drop into both eyes at bedtime.   triamcinolone cream 0.1 % Commonly known as:  KENALOG Apply 1 application topically 2 (two) times daily.   vitamin B-12 1000 MCG tablet Commonly known as:   CYANOCOBALAMIN Take 1,000 mcg by mouth. One time every month on the 8th of every month     Relevant Imaging Results:  Relevant Lab Results:   Additional Information SS#: [id number]  Candie Chroman, LCSW

## 2019-01-06 ENCOUNTER — Other Ambulatory Visit: Payer: Self-pay | Admitting: Family Medicine

## 2019-01-06 MED ORDER — DOXYCYCLINE HYCLATE 100 MG PO TABS: 100.0000 mg | ORAL_TABLET | Freq: Two times a day (BID) | ORAL | 0 refills | Status: DC

## 2019-01-06 NOTE — Progress Notes (Signed)
Wound cultures grew MRSA. [facility] in [place] where patient is a resident notified. Will faxed prescription for Doxycycline for 10 days.  Marjie Skiff, MD [place], PGY-3

## 2019-01-08 LAB — CULTURE, BLOOD (ROUTINE X 2)
Culture: NO GROWTH
Culture: NO GROWTH
Special Requests: ADEQUATE

## 2019-01-08 NOTE — Progress Notes (Signed)
Verified with MD that Keflex was discontinued and doxy was started.   This order and lab results placed in to be faxed pile to be sent to [facility] @ [phone]. Christen Bame, CMA

## 2019-01-09 LAB — AEROBIC/ANAEROBIC CULTURE W GRAM STAIN (SURGICAL/DEEP WOUND)

## 2019-01-09 LAB — AEROBIC/ANAEROBIC CULTURE (SURGICAL/DEEP WOUND)

## 2019-01-18 ENCOUNTER — Ambulatory Visit: Payer: BLUE CROSS/BLUE SHIELD | Admitting: Physician Assistant

## 2019-02-20 ENCOUNTER — Ambulatory Visit: Payer: BLUE CROSS/BLUE SHIELD | Admitting: Urology

## 2019-02-21 ENCOUNTER — Ambulatory Visit: Payer: BLUE CROSS/BLUE SHIELD

## 2019-03-01 ENCOUNTER — Encounter: Payer: Self-pay | Admitting: Emergency Medicine

## 2019-03-01 ENCOUNTER — Emergency Department: Payer: Medicare Other

## 2019-03-01 ENCOUNTER — Emergency Department
Admission: EM | Admit: 2019-03-01 | Discharge: 2019-03-02 | Disposition: A | Discharge status: Home / Self Care | Payer: Medicare Other | Attending: Emergency Medicine | Admitting: Emergency Medicine

## 2019-03-01 ENCOUNTER — Other Ambulatory Visit: Payer: Self-pay

## 2019-03-01 DIAGNOSIS — E119 Type 2 diabetes mellitus without complications: Secondary | ICD-10-CM | POA: Insufficient documentation

## 2019-03-01 DIAGNOSIS — I10 Essential (primary) hypertension: Secondary | ICD-10-CM | POA: Insufficient documentation

## 2019-03-01 DIAGNOSIS — W19XXXA Unspecified fall, initial encounter: Secondary | ICD-10-CM

## 2019-03-01 DIAGNOSIS — R569 Unspecified convulsions: Secondary | ICD-10-CM | POA: Insufficient documentation

## 2019-03-01 DIAGNOSIS — Z79899 Other long term (current) drug therapy: Secondary | ICD-10-CM | POA: Insufficient documentation

## 2019-03-01 LAB — COMPREHENSIVE METABOLIC PANEL
ALT: 17 U/L (ref 0–44)
AST: 21 U/L (ref 15–41)
Albumin: 3.4 g/dL — ABNORMAL LOW (ref 3.5–5.0)
Alkaline Phosphatase: 46 U/L (ref 38–126)
Anion gap: 10 (ref 5–15)
BUN: 15 mg/dL (ref 8–23)
CO2: 23 mmol/L (ref 22–32)
Calcium: 7.6 mg/dL — ABNORMAL LOW (ref 8.9–10.3)
Chloride: 108 mmol/L (ref 98–111)
Creatinine, Ser: 0.76 mg/dL (ref 0.61–1.24)
GFR calc Af Amer: >60 mL/min (ref >60)
GFR calc non Af Amer: >60 mL/min (ref >60)
Glucose, Bld: 136 mg/dL — ABNORMAL HIGH (ref 70–99)
Potassium: 3.3 mmol/L — ABNORMAL LOW (ref 3.5–5.1)
Sodium: 141 mmol/L (ref 135–145)
Total Bilirubin: 0.4 mg/dL (ref 0.3–1.2)
Total Protein: 5.7 g/dL — ABNORMAL LOW (ref 6.5–8.1)

## 2019-03-01 LAB — CBC WITH DIFFERENTIAL/PLATELET
Abs Immature Granulocytes: 0.03 10*3/uL (ref 0.00–0.07)
Basophils Absolute: 0.1 10*3/uL (ref 0.0–0.1)
Basophils Relative: 1 %
Eosinophils Absolute: 0.2 10*3/uL (ref 0.0–0.5)
Eosinophils Relative: 3 %
HCT: 45 % (ref 39.0–52.0)
Hemoglobin: 14.3 g/dL (ref 13.0–17.0)
Immature Granulocytes: 0 %
Lymphocytes Relative: 25 %
Lymphs Abs: 1.9 10*3/uL (ref 0.7–4.0)
MCH: 30.1 pg (ref 26.0–34.0)
MCHC: 31.8 g/dL (ref 30.0–36.0)
MCV: 94.7 fL (ref 80.0–100.0)
Monocytes Absolute: 0.5 10*3/uL (ref 0.1–1.0)
Monocytes Relative: 6 %
Neutro Abs: 5.1 10*3/uL (ref 1.7–7.7)
Neutrophils Relative %: 65 %
Platelets: 201 10*3/uL (ref 150–400)
RBC: 4.75 MIL/uL (ref 4.22–5.81)
RDW: 12.6 % (ref 11.5–15.5)
WBC: 7.7 10*3/uL (ref 4.0–10.5)
nRBC: 0 % (ref 0.0–0.2)

## 2019-03-01 LAB — TROPONIN I (HIGH SENSITIVITY): Troponin I (High Sensitivity): <2 ng/L (ref <18)

## 2019-03-01 NOTE — ED Notes (Signed)
Pt to CT at this time.

## 2019-03-01 NOTE — ED Triage Notes (Signed)
Pt arrives via [facility] with c/o being found on the floor at his residence in [place] in the [place]. Pt is oriented to self only at this time. Per facility, this is baseline. Pt appears in NAD and does not have noticeable bruising to body at this time.

## 2019-03-01 NOTE — ED Provider Notes (Addendum)
[facility] Emergency Department Provider Note   ____________________________________________   First MD Initiated Contact with Patient 03/01/19 2006     (approximate)  I have reviewed the triage vital signs and the nursing notes.   HISTORY  Chief Complaint Fall History limited by dementia HPI Adrian Valdez is a 68 y.o. male with a history of dementia who comes from [facility] after being found on the ground with "seizure activity".  EMS reports they did not see any seizure activity when he arrived.  Staff reported they thought that he was at baseline status.  Patient now is slow to respond slow to follow commands if he follows them at all he is moving all extremities equally although slowly does not report any pain anywhere.        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[place])    Waldenstrom's macroglobulinemia ([place])     Patient Active Problem List   Diagnosis Date Noted   Cellulitis 01/03/2019   Necrosis of finger ([place]) 01/03/2019   Cellulitis of hand 10/30/2018   Encounter for antineoplastic immunotherapy 11/20/2016   Encounter for antineoplastic chemotherapy 10/30/2016   Altered mental status 09/22/2016   Waldenstrom's macroglobulinemia ([place]) 09/01/2016   Enlarged prostate with urinary obstruction 08/16/2016   Prostate cancer screening 07/19/2016   Right Non-Complex Renal Cyst 07/19/2016   Dysuria 11/30/2015   Urgency of urination 11/30/2015   Nocturia 11/30/2015   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   B12 deficiency 07/27/2015   IgM monoclonal gammopathy of uncertain significance 07/24/2015    Confusion state 07/07/2015   BP (high blood pressure) 07/07/2015   Major depressive disorder, single episode, moderate ([place]) 07/07/2015   FOM (frequency of micturition) 07/07/2015    Past Surgical History:  Procedure Laterality Date   CATARACT EXTRACTION W/ INTRAOCULAR LENS IMPLANT     COLONOSCOPY     I&D EXTREMITY Bilateral 01/03/2019   Procedure: IRRIGATION AND DEBRIDEMENT right index finger, and left middle finger;  Surgeon: Leanora Cover, MD;  Location: [place];  Service: Orthopedics;  Laterality: Bilateral;   KNEE ARTHROSCOPY Right    LASIK     PHOTOCOAGULATION WITH LASER Right 05/24/2017   Procedure: PHOTOCOAGULATION WITH LASER;  Surgeon: Leandrew Koyanagi, MD;  Location: [place];  Service: Ophthalmology;  Laterality: Right;  Diabetic - oral meds    Prior to Admission medications   Medication Sig Start Date End Date Taking? Authorizing Provider  acetaminophen (TYLENOL) 650 MG CR tablet Take 650 mg by mouth every 6 (six) hours as needed for pain.    [provider]  diclofenac sodium (VOLTAREN) 1 % GEL Apply 2 g topically every 4 (four) hours as needed for pain.    [provider]  donepezil (ARICEPT) 10 MG tablet Take 10 mg by mouth at bedtime.  09/20/16 01/03/19  [provider]  doxycycline (VIBRA-TABS) 100 MG tablet Take 1 tablet (100 mg total) by mouth 2 (two) times daily. 01/06/19   Diallo, Earna Coder, MD  finasteride (PROSCAR) 5 MG tablet Take 1 tablet (5 mg total) by mouth daily. Reported on 11/30/2015 02/07/17  Alexis Frock, MD  fluticasone [facility]) 50 MCG/ACT nasal spray Place 2 sprays into both nostrils daily.  05/02/18 05/02/19  [provider]  gabapentin (NEURONTIN) 300 MG capsule Take 300 mg by mouth 3 (three) times daily.    [provider]  glimepiride (AMARYL) 2 MG tablet Take 2 mg by mouth 2 (two) times daily. 12/22/16 01/03/19  [provider]  guaifenesin (HUMIBID E) 400 MG TABS tablet Take 400 mg  by mouth every 6 (six) hours as needed (cough/congestion).    [provider]  HYDROcodone-acetaminophen (NORCO/VICODIN) 5-325 MG tablet Take 1 tablet by mouth every 6 (six) hours as needed for moderate pain.    [provider]  lisinopril (PRINIVIL,ZESTRIL) 20 MG tablet Take 20 mg by mouth daily. Reported on 11/30/2015    [provider]  meloxicam (MOBIC) 15 MG tablet Take 15 mg by mouth daily.    [provider]  memantine (NAMENDA) 10 MG tablet Take 10 mg by mouth 2 (two) times daily.  03/09/18   [provider]  methazolamide (NEPTAZANE) 50 MG tablet Take 50 mg by mouth daily.  07/15/15   [provider]  mupirocin ointment (BACTROBAN) 2 % Apply 1 application topically 3 (three) times daily.     [provider]  QUEtiapine (SEROQUEL) 25 MG tablet Take 25 mg by mouth 3 (three) times daily.    [provider]  sertraline (ZOLOFT) 50 MG tablet Take 50 mg by mouth daily.     [provider]  tamsulosin (FLOMAX) 0.4 MG CAPS capsule Take 0.4 mg by mouth daily.     [provider]  timolol (BETIMOL) 0.5 % ophthalmic solution Place 1 drop into both eyes 2 (two) times daily.     [provider]  Travoprost, BAK Free, (TRAVATAN) 0.004 % SOLN ophthalmic solution Place 1 drop into both eyes at bedtime.     [provider]  triamcinolone cream (KENALOG) 0.1 % Apply 1 application topically 2 (two) times daily.    [provider]  vitamin B-12 (CYANOCOBALAMIN) 1000 MCG tablet Take 1,000 mcg by mouth. One time every month on the 8th of every month    [provider]    Allergies Oxybutynin and Myrbetriq [mirabegron]  Family History  Problem Relation Age of Onset   Hypertension Mother    Hypertension Father    CAD Father    Breast cancer Maternal Grandmother    Ovarian cancer Paternal Grandmother    Kidney disease Neg Hx    Prostate cancer Neg Hx     Social  History Social History   Tobacco Use   Smoking status: Never Smoker   Smokeless tobacco: Never Used  Substance Use Topics   Alcohol use: Yes    Comment: social drinker wine couple of times a year   Drug use: No    Review of Systems  Unable to obtain due to dementia  ____________________________________________   PHYSICAL EXAM:  VITAL SIGNS: ED Triage Vitals [03/01/19 2006]  Enc Vitals Group     BP      Pulse      Resp      Temp      Temp src      SpO2      Weight 185 lb (83.9 kg)     Height 6' (1.829 m)     Head Circumference      Peak Flow      Pain Score      Pain Loc  Pain Edu?      Excl. in [place]?     Constitutional: Alert .  Very slow in his actions but in no acute distress. Eyes: Conjunctivae are normal.  Head: Atraumatic. Nose: No congestion/rhinnorhea. Mouth/Throat: Mucous membranes are moist.  Oropharynx non-erythematous. Neck: No stridor.  No cervical spine tenderness to palpation. Cardiovascular: Normal rate, regular rhythm. Grossly normal heart sounds.  Good peripheral circulation. Respiratory: Normal respiratory effort.  No retractions. Lungs CTAB. Gastrointestinal: Soft and nontender. No distention. No abdominal bruits. No CVA tenderness. Musculoskeletal: No lower extremity tenderness trace edema.   Neurologic:  . No gross focal neurologic deficits are appreciated.  But patient does not follow commands well at all.  Is very difficult to do anything approaching a good exam. Skin:  Skin is warm, dry and intact. No rash noted.   ____________________________________________   LABS (all labs ordered are listed, but only abnormal results are displayed)  Labs Reviewed  COMPREHENSIVE METABOLIC PANEL - Abnormal; Notable for the following components:      Result Value   Potassium 3.3 (*)    Glucose, Bld 136 (*)    Calcium 7.6 (*)    Total Protein 5.7 (*)    Albumin 3.4 (*)    All other components within normal limits  CBC WITH  DIFFERENTIAL/PLATELET  TROPONIN I (HIGH SENSITIVITY)   ____________________________________________  EKG EKG read interpreted by me shows normal sinus rhythm rate of 89 left axis decreased R wave progression but no acute changes  ____________________________________________  RADIOLOGY  X-ray reviewed by me read by radiology shows no acute disease slight atelectasis CT of the head and neck read by radiology reviewed by me show no acute problems  Official radiology report(s): Ct Head Wo Contrast  Result Date: 03/01/2019 CLINICAL DATA:  Altered level of consciousness. Found down. EXAM: CT HEAD WITHOUT CONTRAST CT CERVICAL SPINE WITHOUT CONTRAST TECHNIQUE: Multidetector CT imaging of the head and cervical spine was performed following the standard protocol without intravenous contrast. Multiplanar CT image reconstructions of the cervical spine were also generated. COMPARISON:  09/11/2018 FINDINGS: CT HEAD FINDINGS Brain: There is no evidence of acute infarct, intracranial hemorrhage, mass, midline shift, or extra-axial fluid collection. A small chronic infarct is again noted in the right cerebellum. There is moderate cerebral atrophy. Vascular: Calcified atherosclerosis at the skull base. No hyperdense vessel. Skull: No fracture or suspicious osseous lesion. Sinuses/Orbits: Bilateral maxillary sinus mucous retention cysts. Clear mastoid air cells. Bilateral cataract extraction. Other: None. CT CERVICAL SPINE FINDINGS Alignment: Normal. Skull base and vertebrae: No acute fracture or suspicious osseous lesion. Heterotopic ossification between the left-sided posterior elements at C3 and C4. Soft tissues and spinal canal: No prevertebral fluid or swelling. No visible canal hematoma. Disc levels: Similar appearance of cervical disc degeneration, greatest at C5-6 and C6-7 where there is moderate neural foraminal stenosis due to uncovertebral spurring. Upper chest: Clear lung apices. Other: None. IMPRESSION:  1. No evidence of acute intracranial abnormality. 2. Moderate cerebral atrophy. Small chronic right cerebellar infarct. 3. No evidence of acute fracture or subluxation in the cervical spine. Electronically Signed   By: Logan Bores M.D.   On: 03/01/2019 20:48   Ct Cervical Spine Wo Contrast  Result Date: 03/01/2019 CLINICAL DATA:  Altered level of consciousness. Found down. EXAM: CT HEAD WITHOUT CONTRAST CT CERVICAL SPINE WITHOUT CONTRAST TECHNIQUE: Multidetector CT imaging of the head and cervical spine was performed following the standard protocol without intravenous contrast. Multiplanar CT image reconstructions of the cervical spine were also  generated. COMPARISON:  09/11/2018 FINDINGS: CT HEAD FINDINGS Brain: There is no evidence of acute infarct, intracranial hemorrhage, mass, midline shift, or extra-axial fluid collection. A small chronic infarct is again noted in the right cerebellum. There is moderate cerebral atrophy. Vascular: Calcified atherosclerosis at the skull base. No hyperdense vessel. Skull: No fracture or suspicious osseous lesion. Sinuses/Orbits: Bilateral maxillary sinus mucous retention cysts. Clear mastoid air cells. Bilateral cataract extraction. Other: None. CT CERVICAL SPINE FINDINGS Alignment: Normal. Skull base and vertebrae: No acute fracture or suspicious osseous lesion. Heterotopic ossification between the left-sided posterior elements at C3 and C4. Soft tissues and spinal canal: No prevertebral fluid or swelling. No visible canal hematoma. Disc levels: Similar appearance of cervical disc degeneration, greatest at C5-6 and C6-7 where there is moderate neural foraminal stenosis due to uncovertebral spurring. Upper chest: Clear lung apices. Other: None. IMPRESSION: 1. No evidence of acute intracranial abnormality. 2. Moderate cerebral atrophy. Small chronic right cerebellar infarct. 3. No evidence of acute fracture or subluxation in the cervical spine. Electronically Signed   By:  Logan Bores M.D.   On: 03/01/2019 20:48   Dg Chest Portable 1 View  Result Date: 03/01/2019 CLINICAL DATA:  Syncope.  The patient was found down. EXAM: PORTABLE CHEST 1 VIEW COMPARISON:  Radiographs dated 01/03/2019 and 09/11/2018 and chest CT dated 09/12/2016 FINDINGS: The heart size and pulmonary vascularity are normal. There is elevation of the right hemidiaphragm, unchanged since January 2020 but new since January 2018. There is slight atelectasis in the right midzone and medially in the left upper lobe. No effusions no consolidative infiltrates. Extensive gas in the colon. No acute bone abnormality. IMPRESSION: Minimal atelectasis. Electronically Signed   By: Lorriane Shire M.D.   On: 03/01/2019 21:02    ____________________________________________   PROCEDURES  Procedure(s) performed (including Critical Care):  Procedures   ____________________________________________   INITIAL IMPRESSION / ASSESSMENT AND PLAN / ED COURSE  Deontrae Gloria Lambertson was evaluated in Emergency Department on 03/01/2019 for the symptoms described in the history of present illness. He was evaluated in the context of the global COVID-19 pandemic, which necessitated consideration that the patient might be at risk for infection with the SARS-CoV-2 virus that causes COVID-19. Institutional protocols and algorithms that pertain to the evaluation of patients at risk for COVID-19 are in a state of rapid change based on information released by regulatory bodies including the CDC and federal and state organizations. These policies and algorithms were followed during the patient's care in the ED.    Patient reportedly is at baseline mental status.  His tests are negative.  We will let him go home.  If he has another seizure or seizure-like activity is witnessed we may consider treatment but not for the first time.  Is not driving or using hazardous machinery     Clinical Course as of Feb 29 2212  Fri Mar 01, 2019  2020  Troponin I (High Sensitivity) [IR]    Clinical Course User Index [IR] Serita Grammes   At discharge patient is more awake and alert talking making sense.  His brother is here with him now rather reports he is back to baseline.  This makes seizure much more likely.  ____________________________________________   FINAL CLINICAL IMPRESSION(S) / ED DIAGNOSES  Final diagnoses:  Fall, initial encounter  Seizure [facility])     ED Discharge Orders    None       Note:  This document was prepared using Dragon voice recognition software and  may include unintentional dictation errors.    Nena Polio, MD 03/01/19 2209    Nena Polio, MD 03/01/19 2213

## 2019-03-01 NOTE — ED Notes (Signed)
Pt and visitor updated on time. Pt lying in NAD with no pain or further needs expressed.

## 2019-03-01 NOTE — Discharge Instructions (Addendum)
Patient's test are all negative.  Since he may have has a seizure we will treat him as such.  We will not give him any antiseizure medicines at present.  If he has another episode of seizure-like activity he will need to come back and be reevaluated.  We may start him on antiseizure medicine then.  We will need him to follow-up with neurology.  Either Dr. Melrose Nakayama or Dr. Manuella Ghazi should be able to see him.  Please call their offices to set up a follow-up appointment to continue the evaluation of this possible seizure

## 2019-03-01 NOTE — ED Notes (Signed)
Pt and family member updated on wait.

## 2019-05-27 ENCOUNTER — Other Ambulatory Visit: Payer: Self-pay

## 2019-05-27 ENCOUNTER — Emergency Department: Payer: Medicare Other

## 2019-05-27 ENCOUNTER — Other Ambulatory Visit: Payer: Medicare Other

## 2019-05-27 ENCOUNTER — Inpatient Hospital Stay
Admission: EM | Admit: 2019-05-27 | Discharge: 2019-06-14 | DRG: 101 | Disposition: A | Discharge status: Skilled Nursing Facility | Payer: Medicare Other | Attending: Internal Medicine | Admitting: Internal Medicine

## 2019-05-27 ENCOUNTER — Inpatient Hospital Stay: Payer: Medicare Other

## 2019-05-27 DIAGNOSIS — Z923 Personal history of irradiation: Secondary | ICD-10-CM | POA: Diagnosis not present

## 2019-05-27 DIAGNOSIS — H409 Unspecified glaucoma: Secondary | ICD-10-CM | POA: Diagnosis present

## 2019-05-27 DIAGNOSIS — E876 Hypokalemia: Secondary | ICD-10-CM | POA: Diagnosis present

## 2019-05-27 DIAGNOSIS — F321 Major depressive disorder, single episode, moderate: Secondary | ICD-10-CM | POA: Diagnosis present

## 2019-05-27 DIAGNOSIS — N400 Enlarged prostate without lower urinary tract symptoms: Secondary | ICD-10-CM | POA: Diagnosis present

## 2019-05-27 DIAGNOSIS — Z23 Encounter for immunization: Secondary | ICD-10-CM

## 2019-05-27 DIAGNOSIS — F05 Delirium due to known physiological condition: Secondary | ICD-10-CM | POA: Diagnosis present

## 2019-05-27 DIAGNOSIS — Z79899 Other long term (current) drug therapy: Secondary | ICD-10-CM

## 2019-05-27 DIAGNOSIS — Z7189 Other specified counseling: Secondary | ICD-10-CM | POA: Diagnosis not present

## 2019-05-27 DIAGNOSIS — Z9221 Personal history of antineoplastic chemotherapy: Secondary | ICD-10-CM | POA: Diagnosis not present

## 2019-05-27 DIAGNOSIS — Z66 Do not resuscitate: Secondary | ICD-10-CM | POA: Diagnosis present

## 2019-05-27 DIAGNOSIS — G40409 Other generalized epilepsy and epileptic syndromes, not intractable, without status epilepticus: Secondary | ICD-10-CM | POA: Diagnosis present

## 2019-05-27 DIAGNOSIS — Z79891 Long term (current) use of opiate analgesic: Secondary | ICD-10-CM

## 2019-05-27 DIAGNOSIS — F03918 Unspecified dementia, unspecified severity, with other behavioral disturbance: Secondary | ICD-10-CM

## 2019-05-27 DIAGNOSIS — E538 Deficiency of other specified B group vitamins: Secondary | ICD-10-CM | POA: Diagnosis present

## 2019-05-27 DIAGNOSIS — R0902 Hypoxemia: Secondary | ICD-10-CM | POA: Diagnosis present

## 2019-05-27 DIAGNOSIS — E1140 Type 2 diabetes mellitus with diabetic neuropathy, unspecified: Secondary | ICD-10-CM | POA: Diagnosis present

## 2019-05-27 DIAGNOSIS — Z9849 Cataract extraction status, unspecified eye: Secondary | ICD-10-CM

## 2019-05-27 DIAGNOSIS — N179 Acute kidney failure, unspecified: Secondary | ICD-10-CM | POA: Diagnosis present

## 2019-05-27 DIAGNOSIS — Y92122 Bedroom in nursing home as the place of occurrence of the external cause: Secondary | ICD-10-CM

## 2019-05-27 DIAGNOSIS — I10 Essential (primary) hypertension: Secondary | ICD-10-CM | POA: Diagnosis present

## 2019-05-27 DIAGNOSIS — Z961 Presence of intraocular lens: Secondary | ICD-10-CM | POA: Diagnosis present

## 2019-05-27 DIAGNOSIS — Z888 Allergy status to other drugs, medicaments and biological substances status: Secondary | ICD-10-CM

## 2019-05-27 DIAGNOSIS — R509 Fever, unspecified: Secondary | ICD-10-CM

## 2019-05-27 DIAGNOSIS — N390 Urinary tract infection, site not specified: Secondary | ICD-10-CM | POA: Diagnosis present

## 2019-05-27 DIAGNOSIS — R451 Restlessness and agitation: Secondary | ICD-10-CM | POA: Diagnosis present

## 2019-05-27 DIAGNOSIS — W19XXXA Unspecified fall, initial encounter: Secondary | ICD-10-CM | POA: Diagnosis present

## 2019-05-27 DIAGNOSIS — S0003XA Contusion of scalp, initial encounter: Secondary | ICD-10-CM | POA: Diagnosis present

## 2019-05-27 DIAGNOSIS — Z515 Encounter for palliative care: Secondary | ICD-10-CM | POA: Diagnosis present

## 2019-05-27 DIAGNOSIS — Z8579 Personal history of other malignant neoplasms of lymphoid, hematopoietic and related tissues: Secondary | ICD-10-CM

## 2019-05-27 DIAGNOSIS — E11649 Type 2 diabetes mellitus with hypoglycemia without coma: Secondary | ICD-10-CM | POA: Diagnosis present

## 2019-05-27 DIAGNOSIS — L8990 Pressure ulcer of unspecified site, unspecified stage: Secondary | ICD-10-CM | POA: Diagnosis present

## 2019-05-27 DIAGNOSIS — Z8249 Family history of ischemic heart disease and other diseases of the circulatory system: Secondary | ICD-10-CM

## 2019-05-27 DIAGNOSIS — F0391 Unspecified dementia with behavioral disturbance: Secondary | ICD-10-CM | POA: Diagnosis present

## 2019-05-27 DIAGNOSIS — Z20828 Contact with and (suspected) exposure to other viral communicable diseases: Secondary | ICD-10-CM | POA: Diagnosis present

## 2019-05-27 DIAGNOSIS — R569 Unspecified convulsions: Secondary | ICD-10-CM

## 2019-05-27 LAB — GLUCOSE, CAPILLARY
Glucose-Capillary: 90 mg/dL (ref 70–99)
Glucose-Capillary: 80 mg/dL (ref 70–99)
Glucose-Capillary: 62 mg/dL — ABNORMAL LOW (ref 70–99)
Glucose-Capillary: 64 mg/dL — ABNORMAL LOW (ref 70–99)
Glucose-Capillary: 111 mg/dL — ABNORMAL HIGH (ref 70–99)
Glucose-Capillary: 66 mg/dL — ABNORMAL LOW (ref 70–99)
Glucose-Capillary: 70 mg/dL (ref 70–99)
Glucose-Capillary: 60 mg/dL — ABNORMAL LOW (ref 70–99)
Glucose-Capillary: 116 mg/dL — ABNORMAL HIGH (ref 70–99)
Glucose-Capillary: 79 mg/dL (ref 70–99)
Glucose-Capillary: 111 mg/dL — ABNORMAL HIGH (ref 70–99)

## 2019-05-27 LAB — URINALYSIS, COMPLETE (UACMP) WITH MICROSCOPIC
Bacteria, UA: NONE SEEN
Bilirubin Urine: NEGATIVE
Glucose, UA: NEGATIVE mg/dL
Ketones, ur: NEGATIVE mg/dL
Leukocytes,Ua: NEGATIVE
Nitrite: NEGATIVE
Protein, ur: NEGATIVE mg/dL
Specific Gravity, Urine: 1.008 (ref 1.005–1.030)
pH: 6 (ref 5.0–8.0)

## 2019-05-27 LAB — COMPREHENSIVE METABOLIC PANEL
ALT: 15 U/L (ref 0–44)
AST: 21 U/L (ref 15–41)
Albumin: 4.2 g/dL (ref 3.5–5.0)
Alkaline Phosphatase: 46 U/L (ref 38–126)
Anion gap: 12 (ref 5–15)
BUN: 18 mg/dL (ref 8–23)
CO2: 26 mmol/L (ref 22–32)
Calcium: 8.9 mg/dL (ref 8.9–10.3)
Chloride: 101 mmol/L (ref 98–111)
Creatinine, Ser: 0.94 mg/dL (ref 0.61–1.24)
GFR calc Af Amer: >60 mL/min (ref >60)
GFR calc non Af Amer: >60 mL/min (ref >60)
Glucose, Bld: 102 mg/dL — ABNORMAL HIGH (ref 70–99)
Potassium: 3 mmol/L — ABNORMAL LOW (ref 3.5–5.1)
Sodium: 139 mmol/L (ref 135–145)
Total Bilirubin: 0.7 mg/dL (ref 0.3–1.2)
Total Protein: 6.8 g/dL (ref 6.5–8.1)

## 2019-05-27 LAB — URINE DRUG SCREEN, QUALITATIVE (ARMC ONLY)
Amphetamines, Ur Screen: NOT DETECTED
Barbiturates, Ur Screen: NOT DETECTED
Benzodiazepine, Ur Scrn: NOT DETECTED
Cannabinoid 50 Ng, Ur ~~LOC~~: NOT DETECTED
Cocaine Metabolite,Ur ~~LOC~~: NOT DETECTED
MDMA (Ecstasy)Ur Screen: NOT DETECTED
Methadone Scn, Ur: NOT DETECTED
Opiate, Ur Screen: NOT DETECTED
Phencyclidine (PCP) Ur S: NOT DETECTED
Tricyclic, Ur Screen: NOT DETECTED

## 2019-05-27 LAB — TSH: TSH: 2.107 u[IU]/mL (ref 0.350–4.500)

## 2019-05-27 LAB — MRSA PCR SCREENING: MRSA by PCR: NEGATIVE

## 2019-05-27 LAB — CBC
HCT: 45.1 % (ref 39.0–52.0)
Hemoglobin: 14.2 g/dL (ref 13.0–17.0)
MCH: 29.7 pg (ref 26.0–34.0)
MCHC: 31.5 g/dL (ref 30.0–36.0)
MCV: 94.4 fL (ref 80.0–100.0)
Platelets: 189 10*3/uL (ref 150–400)
RBC: 4.78 MIL/uL (ref 4.22–5.81)
RDW: 13.1 % (ref 11.5–15.5)
WBC: 5.6 10*3/uL (ref 4.0–10.5)
nRBC: 0 % (ref 0.0–0.2)

## 2019-05-27 LAB — HEMOGLOBIN A1C
Hgb A1c MFr Bld: 6 % — ABNORMAL HIGH (ref 4.8–5.6)
Mean Plasma Glucose: 125.5 mg/dL

## 2019-05-27 LAB — SARS CORONAVIRUS 2 (TAT 6-24 HRS): SARS Coronavirus 2: NEGATIVE

## 2019-05-27 LAB — PHOSPHORUS: Phosphorus: 2.9 mg/dL (ref 2.5–4.6)

## 2019-05-27 LAB — MAGNESIUM: Magnesium: 2.1 mg/dL (ref 1.7–2.4)

## 2019-05-27 LAB — CK: Total CK: 75 U/L (ref 49–397)

## 2019-05-27 MED ORDER — DOCUSATE SODIUM 100 MG PO CAPS
100.0000 mg | ORAL_CAPSULE | Freq: Two times a day (BID) | ORAL | Status: DC
  Administered 2019-05-29 – 2019-06-03 (×6): 100 mg via ORAL
  Filled 2019-05-27 (×6): qty 1

## 2019-05-27 MED ORDER — GADOBUTROL 1 MMOL/ML IV SOLN
7.5000 mL | Freq: Once | INTRAVENOUS | Status: AC | PRN
  Administered 2019-05-27: 7.5 mL via INTRAVENOUS

## 2019-05-27 MED ORDER — INSULIN ASPART 100 UNIT/ML ~~LOC~~ SOLN
0.0000 [IU] | Freq: Every day | SUBCUTANEOUS | Status: DC
  Administered 2019-06-02 – 2019-06-13 (×3): 2 [IU] via SUBCUTANEOUS
  Filled 2019-05-27 (×3): qty 1

## 2019-05-27 MED ORDER — LEVETIRACETAM IN NACL 500 MG/100ML IV SOLN
500.0000 mg | Freq: Two times a day (BID) | INTRAVENOUS | Status: DC
  Administered 2019-05-27 – 2019-05-28 (×3): 500 mg via INTRAVENOUS
  Filled 2019-05-27 (×4): qty 100

## 2019-05-27 MED ORDER — LORAZEPAM 2 MG/ML IJ SOLN: 2.0000 mg | Freq: Once | INTRAMUSCULAR | Status: DC

## 2019-05-27 MED ORDER — DEXTROSE 50 % IV SOLN
25.0000 mL | Freq: Once | INTRAVENOUS | Status: AC
  Administered 2019-05-27: 25 mL via INTRAVENOUS
  Filled 2019-05-27: qty 50

## 2019-05-27 MED ORDER — KCL IN DEXTROSE-NACL 20-5-0.9 MEQ/L-%-% IV SOLN
INTRAVENOUS | Status: DC
  Administered 2019-05-27 – 2019-05-29 (×5): via INTRAVENOUS
  Filled 2019-05-27 (×7): qty 1000

## 2019-05-27 MED ORDER — POTASSIUM CHLORIDE IN NACL 20-0.9 MEQ/L-% IV SOLN
INTRAVENOUS | Status: DC
  Administered 2019-05-27: 10:00:00 via INTRAVENOUS
  Filled 2019-05-27 (×2): qty 1000

## 2019-05-27 MED ORDER — LEVETIRACETAM IN NACL 1500 MG/100ML IV SOLN: 1500.0000 mg | INTRAVENOUS | Status: DC

## 2019-05-27 MED ORDER — DEXTROSE 50 % IV SOLN
25.0000 mL | Freq: Once | INTRAVENOUS | Status: AC
  Administered 2019-05-27: 14:00:00 25 mL via INTRAVENOUS
  Filled 2019-05-27: qty 50

## 2019-05-27 MED ORDER — ACETAMINOPHEN 325 MG PO TABS
650.0000 mg | ORAL_TABLET | Freq: Four times a day (QID) | ORAL | Status: DC | PRN
  Administered 2019-05-30 – 2019-06-09 (×4): 650 mg via ORAL
  Filled 2019-05-27 (×4): qty 2

## 2019-05-27 MED ORDER — ACETAMINOPHEN 650 MG RE SUPP: 650.0000 mg | Freq: Four times a day (QID) | RECTAL | Status: DC | PRN

## 2019-05-27 MED ORDER — ONDANSETRON HCL 4 MG PO TABS: 4.0000 mg | ORAL_TABLET | Freq: Four times a day (QID) | ORAL | Status: DC | PRN

## 2019-05-27 MED ORDER — POTASSIUM CHLORIDE 20 MEQ PO PACK
40.0000 meq | PACK | Freq: Once | ORAL | Status: AC
  Administered 2019-05-27: 04:00:00 40 meq via ORAL
  Filled 2019-05-27: qty 2

## 2019-05-27 MED ORDER — LORAZEPAM 2 MG/ML IJ SOLN
INTRAMUSCULAR | Status: AC
  Administered 2019-05-27: 09:00:00 2 mg via INTRAVENOUS
  Filled 2019-05-27: qty 1

## 2019-05-27 MED ORDER — INSULIN ASPART 100 UNIT/ML ~~LOC~~ SOLN
0.0000 [IU] | Freq: Three times a day (TID) | SUBCUTANEOUS | Status: DC
  Administered 2019-05-28: 1 [IU] via SUBCUTANEOUS
  Administered 2019-05-29 (×3): 2 [IU] via SUBCUTANEOUS
  Administered 2019-05-30: 1 [IU] via SUBCUTANEOUS
  Administered 2019-05-30 – 2019-05-31 (×2): 2 [IU] via SUBCUTANEOUS
  Administered 2019-05-31: 13:00:00 1 [IU] via SUBCUTANEOUS
  Administered 2019-06-01: 2 [IU] via SUBCUTANEOUS
  Administered 2019-06-02: 08:00:00 1 [IU] via SUBCUTANEOUS
  Administered 2019-06-02: 3 [IU] via SUBCUTANEOUS
  Administered 2019-06-03: 1 [IU] via SUBCUTANEOUS
  Administered 2019-06-03 – 2019-06-05 (×5): 2 [IU] via SUBCUTANEOUS
  Administered 2019-06-05: 13:00:00 3 [IU] via SUBCUTANEOUS
  Administered 2019-06-05 – 2019-06-06 (×3): 1 [IU] via SUBCUTANEOUS
  Administered 2019-06-06: 3 [IU] via SUBCUTANEOUS
  Administered 2019-06-07: 2 [IU] via SUBCUTANEOUS
  Administered 2019-06-07 – 2019-06-09 (×6): 1 [IU] via SUBCUTANEOUS
  Administered 2019-06-09 – 2019-06-10 (×2): 2 [IU] via SUBCUTANEOUS
  Administered 2019-06-10: 08:00:00 1 [IU] via SUBCUTANEOUS
  Administered 2019-06-10: 2 [IU] via SUBCUTANEOUS
  Administered 2019-06-11 (×2): 3 [IU] via SUBCUTANEOUS
  Administered 2019-06-11: 08:00:00 2 [IU] via SUBCUTANEOUS
  Administered 2019-06-12: 18:00:00 5 [IU] via SUBCUTANEOUS
  Administered 2019-06-12: 09:00:00 2 [IU] via SUBCUTANEOUS
  Administered 2019-06-12: 3 [IU] via SUBCUTANEOUS
  Administered 2019-06-13: 2 [IU] via SUBCUTANEOUS
  Administered 2019-06-13: 3 [IU] via SUBCUTANEOUS
  Administered 2019-06-13: 18:00:00 2 [IU] via SUBCUTANEOUS
  Administered 2019-06-14: 1 [IU] via SUBCUTANEOUS
  Administered 2019-06-14: 2 [IU] via SUBCUTANEOUS
  Filled 2019-05-27 (×38): qty 1

## 2019-05-27 MED ORDER — LEVETIRACETAM IN NACL 1000 MG/100ML IV SOLN
1000.0000 mg | INTRAVENOUS | Status: AC
  Administered 2019-05-27: 06:00:00 1000 mg via INTRAVENOUS
  Filled 2019-05-27: qty 100

## 2019-05-27 MED ORDER — ENOXAPARIN SODIUM 40 MG/0.4ML ~~LOC~~ SOLN
40.0000 mg | SUBCUTANEOUS | Status: DC
  Administered 2019-05-27: 40 mg via SUBCUTANEOUS
  Filled 2019-05-27: qty 0.4

## 2019-05-27 MED ORDER — LORAZEPAM 2 MG/ML IJ SOLN
INTRAMUSCULAR | Status: AC
  Filled 2019-05-27: qty 1

## 2019-05-27 MED ORDER — LORAZEPAM 2 MG/ML IJ SOLN
2.0000 mg | Freq: Once | INTRAMUSCULAR | Status: AC
  Administered 2019-05-27: 09:00:00 2 mg via INTRAVENOUS

## 2019-05-27 MED ORDER — LORAZEPAM 2 MG/ML IJ SOLN
1.0000 mg | Freq: Four times a day (QID) | INTRAMUSCULAR | Status: DC | PRN
  Administered 2019-05-27 – 2019-05-28 (×3): 1 mg via INTRAVENOUS
  Filled 2019-05-27 (×3): qty 1

## 2019-05-27 MED ORDER — ONDANSETRON HCL 4 MG/2ML IJ SOLN: 4.0000 mg | Freq: Four times a day (QID) | INTRAMUSCULAR | Status: DC | PRN

## 2019-05-27 MED ORDER — QUETIAPINE FUMARATE 25 MG PO TABS: 25.0000 mg | ORAL_TABLET | Freq: Three times a day (TID) | ORAL | Status: DC

## 2019-05-27 MED ORDER — LORAZEPAM 2 MG/ML IJ SOLN
1.0000 mg | Freq: Once | INTRAMUSCULAR | Status: AC
  Administered 2019-05-27: 05:00:00 1 mg via INTRAVENOUS

## 2019-05-27 NOTE — ED Notes (Signed)
To CT via stretcher

## 2019-05-27 NOTE — H&P (Signed)
Adrian Valdez. is an 68 y.o. male.   Chief Complaint: Fall HPI: The patient with past medical history of hypertension, diabetes with neuropathy, dementia and Waldenstrm's gammaglobulinemia presents to the emergency department from his living facility after being found on the floor.  The patient was conscious and was alert and oriented.  Initial work-up in the emergency department was unremarkable and the patient was being prepared for discharge when he had a witnessed seizure.  Seizure was generalized tonic-clonic.  It lasted for approximately 30 seconds.  The patient did become hypoxic during the episode and required bag mask ventilation, but he did not lose pulse.  He was given 1 mg of Ativan and seizure activity stopped.  He has not had seizures before (although seizure disorder is labeled in his chart; it is unclear if this is an error).  Past Medical History:  Diagnosis Date  . 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 B12 deficiency 07/27/2015  . Dementia ([place])   . Diabetes mellitus, type 2 ([place])   . Enlarged prostate   . Glaucoma   . Glaucoma   . Hypertension   . Neuropathy    feet and legs  . Urinary frequency   . Vitamin B 12 deficiency   . Waldenstrom macroglobulinemia ([place])   . Waldenstrom's macroglobulinemia [facility])     Past Surgical History:  Procedure Laterality Date  . CATARACT EXTRACTION W/ INTRAOCULAR LENS IMPLANT    . COLONOSCOPY    . I&D EXTREMITY Bilateral 01/03/2019   Procedure: IRRIGATION AND DEBRIDEMENT right index finger, and left middle finger;  Surgeon: Leanora Cover, MD;  Location: [facility];  Service: Orthopedics;  Laterality: Bilateral;  . KNEE ARTHROSCOPY Right   . LASIK    . PHOTOCOAGULATION WITH LASER Right 05/24/2017   Procedure: PHOTOCOAGULATION WITH LASER;  Surgeon: Leandrew Koyanagi, MD;  Location: [facility];  Service: Ophthalmology;  Laterality: Right;  Diabetic - oral meds    Family History  Problem Relation Age of Onset  .  Hypertension Mother   . Hypertension Father   . CAD Father   . Breast cancer Maternal Grandmother   . Ovarian cancer Paternal Grandmother   . Kidney disease Neg Hx   . Prostate cancer Neg Hx    Social History:  reports that he has never smoked. He has never used smokeless tobacco. He reports current alcohol use. He reports that he does not use drugs.  Allergies:  Allergies  Allergen Reactions  . Oxybutynin Other (See Comments)    Headache  . Myrbetriq [Mirabegron] Other (See Comments)    Other reaction(s): Other (See Comments) migraine Hallucinations      Prior to Admission medications   Medication Sig Start Date End Date Taking? Authorizing Provider  acetaminophen (TYLENOL) 650 MG CR tablet Take 650 mg by mouth every 6 (six) hours as needed for pain.    [provider]  diclofenac sodium (VOLTAREN) 1 % GEL Apply 2 g topically every 4 (four) hours as needed for pain.    [provider]  donepezil (ARICEPT) 10 MG tablet Take 10 mg by mouth at bedtime.  09/20/16 01/03/19  [provider]  doxycycline (VIBRA-TABS) 100 MG tablet Take 1 tablet (100 mg total) by mouth 2 (two) times daily. 01/06/19   Diallo, Earna Coder, MD  finasteride (PROSCAR) 5 MG tablet Take 1 tablet (5 mg total) by mouth daily. Reported on 11/30/2015 02/07/17   Alexis Frock, MD  fluticasone [facility]) 50 MCG/ACT nasal spray Place 2 sprays into  both nostrils daily.  05/02/18 05/02/19  [provider]  gabapentin (NEURONTIN) 300 MG capsule Take 300 mg by mouth 3 (three) times daily.    [provider]  glimepiride (AMARYL) 2 MG tablet Take 2 mg by mouth 2 (two) times daily. 12/22/16 01/03/19  [provider]  guaifenesin (HUMIBID E) 400 MG TABS tablet Take 400 mg by mouth every 6 (six) hours as needed (cough/congestion).    [provider]  HYDROcodone-acetaminophen (NORCO/VICODIN) 5-325 MG tablet Take 1 tablet by mouth every 6 (six) hours as needed for moderate pain.     [provider]  lisinopril (PRINIVIL,ZESTRIL) 20 MG tablet Take 20 mg by mouth daily. Reported on 11/30/2015    [provider]  meloxicam (MOBIC) 15 MG tablet Take 15 mg by mouth daily.    [provider]  memantine (NAMENDA) 10 MG tablet Take 10 mg by mouth 2 (two) times daily.  03/09/18   [provider]  methazolamide (NEPTAZANE) 50 MG tablet Take 50 mg by mouth daily.  07/15/15   [provider]  mupirocin ointment (BACTROBAN) 2 % Apply 1 application topically 3 (three) times daily.     [provider]  QUEtiapine (SEROQUEL) 25 MG tablet Take 25 mg by mouth 3 (three) times daily.    [provider]  sertraline (ZOLOFT) 50 MG tablet Take 50 mg by mouth daily.     [provider]  tamsulosin (FLOMAX) 0.4 MG CAPS capsule Take 0.4 mg by mouth daily.     [provider]  timolol (BETIMOL) 0.5 % ophthalmic solution Place 1 drop into both eyes 2 (two) times daily.     [provider]  Travoprost, BAK Free, (TRAVATAN) 0.004 % SOLN ophthalmic solution Place 1 drop into both eyes at bedtime.     [provider]  triamcinolone cream (KENALOG) 0.1 % Apply 1 application topically 2 (two) times daily.    [provider]  vitamin B-12 (CYANOCOBALAMIN) 1000 MCG tablet Take 1,000 mcg by mouth. One time every month on the 8th of every month    [provider]     Results for orders placed or performed during the hospital encounter of 05/27/19 (from the past 48 hour(s))  Glucose, capillary     Status: None   Collection Time: 05/27/19  2:16 AM  Result Value Ref Range   Glucose-Capillary 90 70 - 99 mg/dL  CBC     Status: None   Collection Time: 05/27/19  2:28 AM  Result Value Ref Range   WBC 5.6 4.0 - 10.5 K/uL   RBC 4.78 4.22 - 5.81 MIL/uL   Hemoglobin 14.2 13.0 - 17.0 g/dL   HCT 45.1 39.0 - 52.0 %   MCV 94.4 80.0 - 100.0 fL   MCH 29.7 26.0 - 34.0 pg   MCHC 31.5 30.0 - 36.0 g/dL   RDW  13.1 11.5 - 15.5 %   Platelets 189 150 - 400 K/uL   nRBC 0.0 0.0 - 0.2 %    Comment: Performed at [facility], [address] [place], [place] [address]  Comprehensive metabolic panel     Status: Abnormal   Collection Time: 05/27/19  2:28 AM  Result Value Ref Range   Sodium 139 135 - 145 mmol/L   Potassium 3.0 (L) 3.5 - 5.1 mmol/L   Chloride 101 98 - 111 mmol/L   CO2 26 22 - 32 mmol/L   Glucose, Bld 102 (H) 70 - 99 mg/dL  BUN 18 8 - 23 mg/dL   Creatinine, Ser 0.94 0.61 - 1.24 mg/dL   Calcium 8.9 8.9 - 10.3 mg/dL   Total Protein 6.8 6.5 - 8.1 g/dL   Albumin 4.2 3.5 - 5.0 g/dL   AST 21 15 - 41 U/L   ALT 15 0 - 44 U/L   Alkaline Phosphatase 46 38 - 126 U/L   Total Bilirubin 0.7 0.3 - 1.2 mg/dL   GFR calc non Af Amer >60 >60 mL/min   GFR calc Af Amer >60 >60 mL/min   Anion gap 12 5 - 15    Comment: Performed at [facility], [address] [place], [place] [address]  CK     Status: None   Collection Time: 05/27/19  2:28 AM  Result Value Ref Range   Total CK 75 49 - 397 U/L    Comment: Performed at [facility], [address] [place], [place] [address]  Urinalysis, Complete w Microscopic     Status: Abnormal   Collection Time: 05/27/19  3:33 AM  Result Value Ref Range   Color, Urine STRAW (A) YELLOW   APPearance CLEAR (A) CLEAR   Specific Gravity, Urine 1.008 1.005 - 1.030   pH 6.0 5.0 - 8.0   Glucose, UA NEGATIVE NEGATIVE mg/dL   Hgb urine dipstick SMALL (A) NEGATIVE   Bilirubin Urine NEGATIVE NEGATIVE   Ketones, ur NEGATIVE NEGATIVE mg/dL   Protein, ur NEGATIVE NEGATIVE mg/dL   Nitrite NEGATIVE NEGATIVE   Leukocytes,Ua NEGATIVE NEGATIVE   RBC / HPF 0-5 0 - 5 RBC/hpf   WBC, UA 0-5 0 - 5 WBC/hpf   Bacteria, UA NONE SEEN NONE SEEN   Squamous Epithelial / LPF 0-5 0 - 5   Mucus PRESENT     Comment: Performed at [facility], [address] [place],  [address]  Glucose, capillary     Status: None   Collection Time:  05/27/19  5:03 AM  Result Value Ref Range   Glucose-Capillary 80 70 - 99 mg/dL   Ct Head Wo Contrast  Result Date: 05/27/2019 CLINICAL DATA:  Found laying on the floor EXAM: CT HEAD WITHOUT CONTRAST CT CERVICAL SPINE WITHOUT CONTRAST TECHNIQUE: Multidetector CT imaging of the head and cervical spine was performed following the standard protocol without intravenous contrast. Multiplanar CT image reconstructions of the cervical spine were also generated. COMPARISON:  CT 03/01/2019 FINDINGS: CT HEAD FINDINGS Brain: No acute territorial infarction, hemorrhage, or intracranial mass. Advanced atrophy. Mild small vessel ischemic changes of the white matter. Stable ventricle size. Vascular: No hyperdense vessels.  Carotid vascular calcification. Skull: Normal. Negative for fracture or focal lesion. Sinuses/Orbits: Fluid level left maxillary sinus. Mucosal thickening in the ethmoid sinuses Other: Large right posterior parietal scalp hematoma CT CERVICAL SPINE FINDINGS Alignment: No subluxation.  Facet alignment within normal limits. Skull base and vertebrae: No acute fracture. No primary bone lesion or focal pathologic process. Heterotopic ossification between left-sided posterior elements at C3 and C4, chronic. Soft tissues and spinal canal: No prevertebral fluid or swelling. No visible canal hematoma. Disc levels: Degenerative changes of the cervical spine, most marked at C5-C6 and C6-C7. Upper chest: Negative. Other: None IMPRESSION: 1. No CT evidence for acute intracranial abnormality. Atrophy and mild small vessel ischemic changes of the white matter. Large right posterior scalp hematoma without underlying skull fracture 2. Degenerative changes of the cervical spine. No acute osseous abnormality Electronically Signed   By: Donavan Foil M.D.   On: 05/27/2019 03:05  Ct Cervical Spine Wo Contrast  Result Date: 05/27/2019 CLINICAL DATA:  Found laying on the floor EXAM: CT HEAD WITHOUT CONTRAST CT CERVICAL SPINE  WITHOUT CONTRAST TECHNIQUE: Multidetector CT imaging of the head and cervical spine was performed following the standard protocol without intravenous contrast. Multiplanar CT image reconstructions of the cervical spine were also generated. COMPARISON:  CT 03/01/2019 FINDINGS: CT HEAD FINDINGS Brain: No acute territorial infarction, hemorrhage, or intracranial mass. Advanced atrophy. Mild small vessel ischemic changes of the white matter. Stable ventricle size. Vascular: No hyperdense vessels.  Carotid vascular calcification. Skull: Normal. Negative for fracture or focal lesion. Sinuses/Orbits: Fluid level left maxillary sinus. Mucosal thickening in the ethmoid sinuses Other: Large right posterior parietal scalp hematoma CT CERVICAL SPINE FINDINGS Alignment: No subluxation.  Facet alignment within normal limits. Skull base and vertebrae: No acute fracture. No primary bone lesion or focal pathologic process. Heterotopic ossification between left-sided posterior elements at C3 and C4, chronic. Soft tissues and spinal canal: No prevertebral fluid or swelling. No visible canal hematoma. Disc levels: Degenerative changes of the cervical spine, most marked at C5-C6 and C6-C7. Upper chest: Negative. Other: None IMPRESSION: 1. No CT evidence for acute intracranial abnormality. Atrophy and mild small vessel ischemic changes of the white matter. Large right posterior scalp hematoma without underlying skull fracture 2. Degenerative changes of the cervical spine. No acute osseous abnormality Electronically Signed   By: Donavan Foil M.D.   On: 05/27/2019 03:05    Review of Systems  Unable to perform ROS: Mental status change    Blood pressure (!) 164/91, pulse 85, temperature (!) 97.5 F (36.4 C), temperature source Oral, resp. rate 17, height 5\' 10"  (1.778 m), weight 83.1 kg, SpO2 100 %. Physical Exam  Vitals reviewed. Constitutional: He is oriented to person, place, and time. He appears well-developed and  well-nourished. No distress.  HENT:  Head: Normocephalic and atraumatic.  Mouth/Throat: Oropharynx is clear and moist.  Eyes: Pupils are equal, round, and reactive to light. Conjunctivae and EOM are normal. No scleral icterus.  Neck: Normal range of motion. Neck supple. No JVD present. No tracheal deviation present. No thyromegaly present.  Cardiovascular: Normal rate, regular rhythm and normal heart sounds. Exam reveals no gallop and no friction rub.  No murmur heard. Respiratory: Effort normal.  GI: Soft. Bowel sounds are normal. He exhibits no distension. There is no abdominal tenderness.  Genitourinary:    Genitourinary Comments: Deferred   Musculoskeletal: Normal range of motion.        General: No edema.  Lymphadenopathy:    He has no cervical adenopathy.  Neurological: He is alert and oriented to person, place, and time.  A and O prior to witnessed seizure in the emergency department  Skin: Skin is warm and dry. No rash noted. No erythema.  Psychiatric:  Postictal and resting comfortably.  Thus difficult to assess mental status.     Assessment/Plan This is a 68 year old male admitted for seizures. 1.  Seizures: New onset; unclear etiology at this time.  CT head without acute intracranial abnormality.  No significant metabolic disturbance.  I have given the patient IV Keppra.  Consult neurology for further guidance 2.  Hypokalemia: Replete potassium. 3.  Dementia: Progressive affect may have led to seizures.  Continue once pharmacy reconciliation complete 4.  Fall: Seizure precautions as well as PT/OT evaluation prior to discharge 5.  Diabetes mellitus type 2: Hold oral hypoglycemic agents.  Sliding scale insulin while hospitalized 6.  DVT prophylaxis: Lovenox 7.  GI prophylaxis: None The patient is a full code.  Time spent on admission orders and patient care approximately 45 minutes  Harrie Foreman, MD 05/27/2019, 5:47 AM

## 2019-05-27 NOTE — ED Provider Notes (Addendum)
[facility] Emergency Department Provider Note  ____________________________________________  Time seen: Approximately 3:01 AM  I have reviewed the triage vital signs and the nursing notes.   HISTORY  Chief Complaint Fall  Level 5 caveat:  Portions of the history and physical were unable to be obtained due to dementia   HPI Adrian Valdez. is a 68 y.o. male with a history of advanced dementia, diabetes, hypertension, Waldenstrom macroglobulinemia, anemia who presents for evaluation of a unwitnessed mechanical fall.  Patient was found on the floor next to his bed.  Unclear how long patient was on the floor.  Patient has no recollection of the event.  Patient is alert and oriented x2, calm and cooperative.  Currently at baseline.  Patient has no complaints and denies headache, neck pain, back pain, hip pain, extremity pain, chest pain, abdominal pain.   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   B12 deficiency 07/27/2015   Dementia ([place])    Diabetes mellitus, type 2 ([place])    Enlarged prostate    Glaucoma    Glaucoma    Hypertension    Neuropathy    feet and legs   Urinary frequency    Vitamin B 12 deficiency    Waldenstrom macroglobulinemia ([place])    Waldenstrom's macroglobulinemia ([place])     Patient Active Problem List   Diagnosis Date Noted   Cellulitis 01/03/2019   Necrosis of finger ([place]) 01/03/2019   Cellulitis of hand 10/30/2018   Encounter for antineoplastic immunotherapy 11/20/2016   Encounter for antineoplastic chemotherapy 10/30/2016   Altered mental status 09/22/2016   Waldenstrom's macroglobulinemia ([place]) 09/01/2016   Enlarged prostate with urinary obstruction 08/16/2016   Prostate cancer screening 07/19/2016   Right Non-Complex Renal Cyst 07/19/2016   Dysuria 11/30/2015   Urgency of urination 11/30/2015   Nocturia 11/30/2015   Ascending aorta enlargement  ([place]) 08/18/2015   B12 deficiency 07/27/2015   IgM monoclonal gammopathy of uncertain significance 07/24/2015   Confusion state 07/07/2015   BP (high blood pressure) 07/07/2015   Major depressive disorder, single episode, moderate ([place]) 07/07/2015   FOM (frequency of micturition) 07/07/2015    Past Surgical History:  Procedure Laterality Date   CATARACT EXTRACTION W/ INTRAOCULAR LENS IMPLANT     COLONOSCOPY     I&D EXTREMITY Bilateral 01/03/2019   Procedure: IRRIGATION AND DEBRIDEMENT right index finger, and left middle finger;  Surgeon: Leanora Cover, MD;  Location: [facility];  Service: Orthopedics;  Laterality: Bilateral;   KNEE ARTHROSCOPY Right    LASIK     PHOTOCOAGULATION WITH LASER Right 05/24/2017   Procedure: PHOTOCOAGULATION WITH LASER;  Surgeon: Leandrew Koyanagi, MD;  Location: [facility];  Service: Ophthalmology;  Laterality: Right;  Diabetic - oral meds    Prior to Admission medications   Medication Sig Start Date End Date Taking? Authorizing Provider  acetaminophen (TYLENOL) 650 MG CR tablet Take 650 mg by mouth every 6 (six) hours as needed for pain.    [provider]  diclofenac sodium (VOLTAREN) 1 % GEL Apply 2 g topically every 4 (four) hours as needed for pain.    [provider]  donepezil (ARICEPT) 10 MG tablet Take 10 mg by mouth at bedtime.  09/20/16 01/03/19  [provider]  doxycycline (VIBRA-TABS) 100 MG tablet Take 1 tablet (100 mg total) by mouth 2 (two) times daily. 01/06/19   Diallo, Earna Coder, MD  finasteride (PROSCAR) 5 MG tablet Take 1 tablet (5  mg total) by mouth daily. Reported on 11/30/2015 02/07/17   Alexis Frock, MD  fluticasone [facility]) 50 MCG/ACT nasal spray Place 2 sprays into both nostrils daily.  05/02/18 05/02/19  [provider]  gabapentin (NEURONTIN) 300 MG capsule Take 300 mg by mouth 3 (three) times daily.    [provider]  glimepiride (AMARYL) 2 MG tablet Take 2 mg by mouth 2  (two) times daily. 12/22/16 01/03/19  [provider]  guaifenesin (HUMIBID E) 400 MG TABS tablet Take 400 mg by mouth every 6 (six) hours as needed (cough/congestion).    [provider]  HYDROcodone-acetaminophen (NORCO/VICODIN) 5-325 MG tablet Take 1 tablet by mouth every 6 (six) hours as needed for moderate pain.    [provider]  lisinopril (PRINIVIL,ZESTRIL) 20 MG tablet Take 20 mg by mouth daily. Reported on 11/30/2015    [provider]  meloxicam (MOBIC) 15 MG tablet Take 15 mg by mouth daily.    [provider]  memantine (NAMENDA) 10 MG tablet Take 10 mg by mouth 2 (two) times daily.  03/09/18   [provider]  methazolamide (NEPTAZANE) 50 MG tablet Take 50 mg by mouth daily.  07/15/15   [provider]  mupirocin ointment (BACTROBAN) 2 % Apply 1 application topically 3 (three) times daily.     [provider]  QUEtiapine (SEROQUEL) 25 MG tablet Take 25 mg by mouth 3 (three) times daily.    [provider]  sertraline (ZOLOFT) 50 MG tablet Take 50 mg by mouth daily.     [provider]  tamsulosin (FLOMAX) 0.4 MG CAPS capsule Take 0.4 mg by mouth daily.     [provider]  timolol (BETIMOL) 0.5 % ophthalmic solution Place 1 drop into both eyes 2 (two) times daily.     [provider]  Travoprost, BAK Free, (TRAVATAN) 0.004 % SOLN ophthalmic solution Place 1 drop into both eyes at bedtime.     [provider]  triamcinolone cream (KENALOG) 0.1 % Apply 1 application topically 2 (two) times daily.    [provider]  vitamin B-12 (CYANOCOBALAMIN) 1000 MCG tablet Take 1,000 mcg by mouth. One time every month on the 8th of every month    [provider]    Allergies Oxybutynin and Myrbetriq [mirabegron]  Family History  Problem Relation Age of Onset   Hypertension Mother    Hypertension Father    CAD Father    Breast cancer Maternal Grandmother     Ovarian cancer Paternal Grandmother    Kidney disease Neg Hx    Prostate cancer Neg Hx     Social History Social History   Tobacco Use   Smoking status: Never Smoker   Smokeless tobacco: Never Used  Substance Use Topics   Alcohol use: Yes    Comment: social drinker wine couple of times a year   Drug use: No    Review of Systems  Constitutional: Negative for fever. Eyes: Negative for visual changes. ENT: Negative for facial injury or neck injury Cardiovascular: Negative for chest injury. Respiratory: Negative for shortness of breath. Negative for chest wall injury. Gastrointestinal: Negative for abdominal pain or injury. Genitourinary: Negative for dysuria. Musculoskeletal: Negative for back injury, negative for arm or leg pain. Skin: Negative for laceration/abrasions. Neurological: Negative for head injury.   ____________________________________________   PHYSICAL EXAM:  VITAL SIGNS: ED Triage Vitals  Enc Vitals Group     BP 05/27/19 0217 (!) 164/91     Pulse  Rate 05/27/19 0217 85     Resp 05/27/19 0217 17     Temp 05/27/19 0217 (!) 97.5 F (36.4 C)     Temp Source 05/27/19 0217 Oral     SpO2 05/27/19 0217 100 %     Weight 05/27/19 0219 183 lb 3.2 oz (83.1 kg)     Height 05/27/19 0219 5\' 10"  (1.778 m)     Head Circumference --      Peak Flow --      Pain Score --      Pain Loc --      Pain Edu? --      Excl. in [place]? --     Full spinal precautions maintained throughout the trauma exam. Constitutional: Alert and oriented x2. No acute distress. Does not appear intoxicated. HEENT Head: Normocephalic.  Posterior scalp hematoma Face: No facial bony tenderness. Stable midface Ears: No hemotympanum bilaterally. No Battle sign Eyes: No eye injury. PERRL. No raccoon eyes Nose: Nontender. No epistaxis. No rhinorrhea Mouth/Throat: Mucous membranes are moist. No oropharyngeal blood. No dental injury. Airway patent without stridor. Normal voice. Neck: no  C-collar. No midline c-spine tenderness.  Cardiovascular: Normal rate, regular rhythm. Normal and symmetric distal pulses are present in all extremities. Pulmonary/Chest: Chest wall is stable and nontender to palpation/compression. Normal respiratory effort. Breath sounds are normal. No crepitus.  Abdominal: Soft, nontender, non distended. Musculoskeletal: Nontender with normal full range of motion in all extremities. No deformities. No thoracic or lumbar midline spinal tenderness. Pelvis is stable. Skin: Skin is warm, dry and intact. No abrasions or contutions. Psychiatric: Speech and behavior are appropriate. Neurological: Normal speech and language. Moves all extremities to command. No gross focal neurologic deficits are appreciated.  Glascow Coma Score: 4 - Opens eyes on own 6 - Follows simple motor commands 4 - Seems confused, disoriented GCS: 14   ____________________________________________   LABS (all labs ordered are listed, but only abnormal results are displayed)  Labs Reviewed  COMPREHENSIVE METABOLIC PANEL - Abnormal; Notable for the following components:      Result Value   Potassium 3.0 (*)    Glucose, Bld 102 (*)    All other components within normal limits  URINALYSIS, COMPLETE (UACMP) WITH MICROSCOPIC - Abnormal; Notable for the following components:   Color, Urine STRAW (*)    APPearance CLEAR (*)    Hgb urine dipstick SMALL (*)    All other components within normal limits  SARS CORONAVIRUS 2 (TAT 6-24 HRS)  GLUCOSE, CAPILLARY  CBC  CK  GLUCOSE, CAPILLARY   ____________________________________________  EKG  ED ECG REPORT I, Rudene Re, the attending physician, personally viewed and interpreted this ECG.  Normal sinus rhythm with significant artifact due to tremor, rate of 79, normal QTC, left axis deviation, no ST elevations or depressions.  Unchanged from prior. ____________________________________________  RADIOLOGY  I have personally  reviewed the images performed during this visit and I agree with the Radiologist's read.   Interpretation by Radiologist:  Ct Head Wo Contrast  Result Date: 05/27/2019 CLINICAL DATA:  Found laying on the floor EXAM: CT HEAD WITHOUT CONTRAST CT CERVICAL SPINE WITHOUT CONTRAST TECHNIQUE: Multidetector CT imaging of the head and cervical spine was performed following the standard protocol without intravenous contrast. Multiplanar CT image reconstructions of the cervical spine were also generated. COMPARISON:  CT 03/01/2019 FINDINGS: CT HEAD FINDINGS Brain: No acute territorial infarction, hemorrhage, or intracranial mass. Advanced atrophy. Mild small vessel ischemic changes of the white matter. Stable ventricle size. Vascular:  No hyperdense vessels.  Carotid vascular calcification. Skull: Normal. Negative for fracture or focal lesion. Sinuses/Orbits: Fluid level left maxillary sinus. Mucosal thickening in the ethmoid sinuses Other: Large right posterior parietal scalp hematoma CT CERVICAL SPINE FINDINGS Alignment: No subluxation.  Facet alignment within normal limits. Skull base and vertebrae: No acute fracture. No primary bone lesion or focal pathologic process. Heterotopic ossification between left-sided posterior elements at C3 and C4, chronic. Soft tissues and spinal canal: No prevertebral fluid or swelling. No visible canal hematoma. Disc levels: Degenerative changes of the cervical spine, most marked at C5-C6 and C6-C7. Upper chest: Negative. Other: None IMPRESSION: 1. No CT evidence for acute intracranial abnormality. Atrophy and mild small vessel ischemic changes of the white matter. Large right posterior scalp hematoma without underlying skull fracture 2. Degenerative changes of the cervical spine. No acute osseous abnormality Electronically Signed   By: Donavan Foil M.D.   On: 05/27/2019 03:05   Ct Cervical Spine Wo Contrast  Result Date: 05/27/2019 CLINICAL DATA:  Found laying on the floor EXAM:  CT HEAD WITHOUT CONTRAST CT CERVICAL SPINE WITHOUT CONTRAST TECHNIQUE: Multidetector CT imaging of the head and cervical spine was performed following the standard protocol without intravenous contrast. Multiplanar CT image reconstructions of the cervical spine were also generated. COMPARISON:  CT 03/01/2019 FINDINGS: CT HEAD FINDINGS Brain: No acute territorial infarction, hemorrhage, or intracranial mass. Advanced atrophy. Mild small vessel ischemic changes of the white matter. Stable ventricle size. Vascular: No hyperdense vessels.  Carotid vascular calcification. Skull: Normal. Negative for fracture or focal lesion. Sinuses/Orbits: Fluid level left maxillary sinus. Mucosal thickening in the ethmoid sinuses Other: Large right posterior parietal scalp hematoma CT CERVICAL SPINE FINDINGS Alignment: No subluxation.  Facet alignment within normal limits. Skull base and vertebrae: No acute fracture. No primary bone lesion or focal pathologic process. Heterotopic ossification between left-sided posterior elements at C3 and C4, chronic. Soft tissues and spinal canal: No prevertebral fluid or swelling. No visible canal hematoma. Disc levels: Degenerative changes of the cervical spine, most marked at C5-C6 and C6-C7. Upper chest: Negative. Other: None IMPRESSION: 1. No CT evidence for acute intracranial abnormality. Atrophy and mild small vessel ischemic changes of the white matter. Large right posterior scalp hematoma without underlying skull fracture 2. Degenerative changes of the cervical spine. No acute osseous abnormality Electronically Signed   By: Donavan Foil M.D.   On: 05/27/2019 03:05      ____________________________________________   PROCEDURES  Procedure(s) performed: None Procedures Critical Care performed:  Yes  CRITICAL CARE Performed by: Rudene Re  ?  Total critical care time: 35 min  Critical care time was exclusive of separately billable procedures and treating other  patients.  Critical care was necessary to treat or prevent imminent or life-threatening deterioration.  Critical care was time spent personally by me on the following activities: development of treatment plan with patient and/or surrogate as well as nursing, discussions with consultants, evaluation of patient's response to treatment, examination of patient, obtaining history from patient or surrogate, ordering and performing treatments and interventions, ordering and review of laboratory studies, ordering and review of radiographic studies, pulse oximetry and re-evaluation of patient's condition.  ____________________________________________   INITIAL IMPRESSION / ASSESSMENT AND PLAN / ED COURSE   68 y.o. male with a history of advanced dementia, diabetes, hypertension, Waldenstrom macroglobulinemia, anemia who presents for evaluation of a unwitnessed mechanical fall.  Patient does not recollect fall.  Has no complaints.  Is alert and oriented x2 with a GCS  of 14.  EKG showing no dysrhythmias or ischemia.  Will check basic labs to rule out anemia, dehydration, AKI, electrolyte abnormalities, UTI.  Will do CT scan of the head and cervical spine.  No other injuries on exam  Clinical Course as of May 26 514  Mon May 27, 2019  0408 Labs with no evidence of UTI, dehydration, anemia, significant electrolyte abnormalities, hypoglycemia, UTI, rhabdo.  CT head and cervical spine negative for intracranial or cervical acute process.  Patient with mild hypokalemia which was supplemented p.o.  Patient be discharged back to his skilled nursing facility.  Patient remained stable and at baseline.   [CV]    Clinical Course User Index [CV] Alfred Levins [place], MD    _________________________ 5:06 AM on 05/27/2019 -----------------------------------------  Patient was awaiting EMS for transport to SNF when he was witnessed by nursing staff to be having a generalized tonic clonic seizure. Patient became  hypoxic to the 30s for a few seconds, bagged with immediate resolution of hypoxia. Patient with no prior h/o seizure. Seizure stopped without intervention. No significant electrolyte abnormalities, BG 80, head CT negative. Since this is a new diagnosis for the patient, will admit to Hospitalist service. Patient very agitated due to post-ictal phase, given 1mg  of IV ativan   As part of my medical decision making, I reviewed the following data within the [facility] notes reviewed and incorporated, Labs reviewed , EKG interpreted , Old EKG reviewed, Old chart reviewed, Radiograph reviewed , Notes from prior ED visits and [place] Controlled Substance Database   Patient was evaluated in Emergency Department today for the symptoms described in the history of present illness. Patient was evaluated in the context of the global COVID-19 pandemic, which necessitated consideration that the patient might be at risk for infection with the SARS-CoV-2 virus that causes COVID-19. Institutional protocols and algorithms that pertain to the evaluation of patients at risk for COVID-19 are in a state of rapid change based on information released by regulatory bodies including the CDC and federal and state organizations. These policies and algorithms were followed during the patient's care in the ED.   ____________________________________________   FINAL CLINICAL IMPRESSION(S) / ED DIAGNOSES   Final diagnoses:  Fall, initial encounter  Hematoma of scalp, initial encounter  Seizure ([place])      NEW MEDICATIONS STARTED DURING THIS VISIT:  ED Discharge Orders    None       Note:  This document was prepared using Dragon voice recognition software and may include unintentional dictation errors.    Alfred Levins, [place], MD 05/27/19 [place], [place], MD 05/27/19 [place], [place], MD 05/27/19 [phone]

## 2019-05-27 NOTE — ED Notes (Signed)
Patient had grand mal seizure for about 30 seconds. Dr Alfred Levins at the bedside. Patient with agonal breathing post seizure. Patient bagged by Dr. Alfred Levins for about 30 seconds and then placed on [place].

## 2019-05-27 NOTE — Progress Notes (Signed)
Infection Prevention  Call to Andre Lefort RN :  Unit:1C Regarding:Asymptomattic patient from SNF with pending COVID test   IP Recommendation: Per latest testing guidance, patient will have 6-24 hrs pending results. Since he is from a congregate living facility observe airborne/contact level of PPE(n95, gown and gloves). He does not require negative pressure room or to be housed on designated COVID floor unless test results positive.

## 2019-05-27 NOTE — ED Notes (Signed)
Patient resting quietly with eyes closed in no acute distress.  

## 2019-05-27 NOTE — ED Notes (Signed)
Returned to room.  Patient placed on cardiac monitor, yellow socks on patient's feet, mats on floor beside bed.

## 2019-05-27 NOTE — ED Triage Notes (Addendum)
Patient to Rm 18 via EMS from local nsg facility for possible unwitnessed fall.  Per EMS staff said they found patient laying in floor by his bed. Per EMS report they were told by staff that patient refuses to have vital signs taken or to take his medications so usually they just leave him alone.  EMS reports fire dept on scene had a CBG of 70.  CBG on arrival to ED 90.   Patient denies any complaints at this time.

## 2019-05-27 NOTE — Progress Notes (Signed)
[facility] NAME: Adrian Valdez    MR#:  [id number]  DATE OF BIRTH:  May 14, 1951  SUBJECTIVE:   Patient came in from school facility with witnessed generalized tonic clonic seizure which appears to be new. He received earlier Ativan. He has history of dementia and has been intermittently agitated and restless. REVIEW OF SYSTEMS:   Review of Systems  Unable to perform ROS: Mental status change   Tolerating Diet: Tolerating PT:   DRUG ALLERGIES:   Allergies  Allergen Reactions  . Oxybutynin Other (See Comments)    Headache  . Myrbetriq [Mirabegron] Other (See Comments)    Other reaction(s): Other (See Comments) migraine Hallucinations      VITALS:  Blood pressure 130/85, pulse 78, temperature 97.7 F (36.5 C), temperature source Oral, resp. rate 17, height 5\' 10"  (1.778 m), weight 83.1 kg, SpO2 99 %.  PHYSICAL EXAMINATION:   Physical Exam limited exam patient is sedated GENERAL:  68 y.o.-year-old patient lying in the bed with no acute distress. Appears chronically ill EYES: Pupils equal, round, reactive to light and accommodation. No scleral icterus. Extraocular muscles intact.  HEENT: Head atraumatic, normocephalic. Oropharynx and nasopharynx clear.  NECK:  Supple, no jugular venous distention. No thyroid enlargement, no tenderness.  LUNGS: Normal breath sounds bilaterally, no wheezing, rales, rhonchi. No use of accessory muscles of respiration.  CARDIOVASCULAR: S1, S2 normal. No murmurs, rubs, or gallops.  ABDOMEN: Soft, nontender, nondistended. Bowel sounds present. No organomegaly or mass.  EXTREMITIES: No cyanosis, clubbing or edema b/l.    NEUROLOGIC: patient sedated PSYCHIATRIC:  patient is lethargic from sedation SKIN: No obvious rash, lesion, or ulcer.   LABORATORY PANEL:  CBC Recent Labs  Lab 05/27/19 0228  WBC 5.6  HGB 14.2  HCT 45.1  PLT 189    Chemistries  Recent Labs  Lab 05/27/19 0228   NA 139  K 3.0*  CL 101  CO2 26  GLUCOSE 102*  BUN 18  CREATININE 0.94  CALCIUM 8.9  MG 2.1  AST 21  ALT 15  ALKPHOS 46  BILITOT 0.7   Cardiac Enzymes No results for input(s): TROPONINI in the last 168 hours. RADIOLOGY:  Ct Head Wo Contrast  Result Date: 05/27/2019 CLINICAL DATA:  Found laying on the floor EXAM: CT HEAD WITHOUT CONTRAST CT CERVICAL SPINE WITHOUT CONTRAST TECHNIQUE: Multidetector CT imaging of the head and cervical spine was performed following the standard protocol without intravenous contrast. Multiplanar CT image reconstructions of the cervical spine were also generated. COMPARISON:  CT 03/01/2019 FINDINGS: CT HEAD FINDINGS Brain: No acute territorial infarction, hemorrhage, or intracranial mass. Advanced atrophy. Mild small vessel ischemic changes of the white matter. Stable ventricle size. Vascular: No hyperdense vessels.  Carotid vascular calcification. Skull: Normal. Negative for fracture or focal lesion. Sinuses/Orbits: Fluid level left maxillary sinus. Mucosal thickening in the ethmoid sinuses Other: Large right posterior parietal scalp hematoma CT CERVICAL SPINE FINDINGS Alignment: No subluxation.  Facet alignment within normal limits. Skull base and vertebrae: No acute fracture. No primary bone lesion or focal pathologic process. Heterotopic ossification between left-sided posterior elements at C3 and C4, chronic. Soft tissues and spinal canal: No prevertebral fluid or swelling. No visible canal hematoma. Disc levels: Degenerative changes of the cervical spine, most marked at C5-C6 and C6-C7. Upper chest: Negative. Other: None IMPRESSION: 1. No CT evidence for acute intracranial abnormality. Atrophy and mild small vessel ischemic changes of the white matter. Large right posterior scalp hematoma without  underlying skull fracture 2. Degenerative changes of the cervical spine. No acute osseous abnormality Electronically Signed   By: Donavan Foil M.D.   On: 05/27/2019  03:05   Ct Cervical Spine Wo Contrast  Result Date: 05/27/2019 CLINICAL DATA:  Found laying on the floor EXAM: CT HEAD WITHOUT CONTRAST CT CERVICAL SPINE WITHOUT CONTRAST TECHNIQUE: Multidetector CT imaging of the head and cervical spine was performed following the standard protocol without intravenous contrast. Multiplanar CT image reconstructions of the cervical spine were also generated. COMPARISON:  CT 03/01/2019 FINDINGS: CT HEAD FINDINGS Brain: No acute territorial infarction, hemorrhage, or intracranial mass. Advanced atrophy. Mild small vessel ischemic changes of the white matter. Stable ventricle size. Vascular: No hyperdense vessels.  Carotid vascular calcification. Skull: Normal. Negative for fracture or focal lesion. Sinuses/Orbits: Fluid level left maxillary sinus. Mucosal thickening in the ethmoid sinuses Other: Large right posterior parietal scalp hematoma CT CERVICAL SPINE FINDINGS Alignment: No subluxation.  Facet alignment within normal limits. Skull base and vertebrae: No acute fracture. No primary bone lesion or focal pathologic process. Heterotopic ossification between left-sided posterior elements at C3 and C4, chronic. Soft tissues and spinal canal: No prevertebral fluid or swelling. No visible canal hematoma. Disc levels: Degenerative changes of the cervical spine, most marked at C5-C6 and C6-C7. Upper chest: Negative. Other: None IMPRESSION: 1. No CT evidence for acute intracranial abnormality. Atrophy and mild small vessel ischemic changes of the white matter. Large right posterior scalp hematoma without underlying skull fracture 2. Degenerative changes of the cervical spine. No acute osseous abnormality Electronically Signed   By: Donavan Foil M.D.   On: 05/27/2019 03:05   Mr Jeri Cos [id number] Contrast  Result Date: 05/27/2019 CLINICAL DATA:  Hypertension and diabetes. Dementia. Gamma globulin disorder. Found on the floor. EXAM: MRI HEAD WITHOUT AND WITH CONTRAST TECHNIQUE: Multiplanar,  multiecho pulse sequences of the brain and surrounding structures were obtained without and with intravenous contrast. CONTRAST:  7.18mL GADAVIST GADOBUTROL 1 MMOL/ML IV SOLN COMPARISON:  Head CT 05/27/2019 FINDINGS: Brain: Diffusion imaging does not show any acute or subacute infarction. Brainstem is normal. 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 Cerebral hemispheres show age related atrophy with mild chronic small-vessel change of the deep white matter. No cortical or large vessel territory infarction. No mass lesion, hemorrhage, hydrocephalus or extra-axial collection. After contrast administration, no abnormal enhancement occurs. Vascular: Major vessels at the base of the brain show flow. Skull and upper cervical spine: Negative Sinuses/Orbits: Small fluid levels in both maxillary sinuses. Orbits negative. Other: Right posterior parietal scalp hematoma. IMPRESSION: Right posterior parietal scalp hematoma. No acute or traumatic intracranial finding. Atrophy and mild chronic small-vessel change. Electronically Signed   By: Nelson Chimes M.D.   On: 05/27/2019 13:45   ASSESSMENT AND PLAN:  Adrian Valdez. is a 68 y.o. male with a history of advanced dementia, diabetes, hypertension, Waldenstrom macroglobulinemia, anemia who presents for evaluation of a unwitnessed mechanical fall.  Patient was found on the floor next to his bed.  Unclear how long patient was on the floor. Per EMS staff at the nursing facility witness generalized tonic clonic seizures. He transitively became hypoxic in the 30s for few seconds and was bad with immediate resolution of hypoxia  1. Generalized tonic clonic seizure new onset, unclear etiology -CT had negative for stroke. Posterior parietal scalp hematoma present on the right -patient was seen by neurology Dr. Doy Mince. Recommends continue IV Keppra 500 mg BID -EEG -IV fluids -PRN Ativan  2.  Relative hypoglycemia inpatient with diabetes -sugars remaining in the  50s and 60s. Will start D5 drip  3. Hypokalemia replete potassium  4. Unwitnessed fall with right posterior parietal scalp hematoma -avoid antiplatelet agent. DC Lovenox  5. Type II diabetes-- holding home meds and insulin given her low sugar  6. History of dementia, chronic will resume chronic dementia meds once patient is more awake and able to take orally.  Case discussed with Care Management/Social Worker.   CODE STATUS: full  DVT Prophylaxis: SCD  TOTAL TIME TAKING CARE OF THIS PATIENT: 30* minutes.  >50% time spent on counselling and coordination of care  POSSIBLE D/C IN **2 to 3* DAYS, DEPENDING ON CLINICAL CONDITION.  Note: This dictation was prepared with Dragon dictation along with smaller phrase technology. Any transcriptional errors that result from this process are unintentional.  Fritzi Mandes M.D on 05/27/2019 at 2:46 PM  Between 7am to 6pm - Pager - [phone]  After 6pm go to [email or web address] - password [email or web address] [facility]  Office  [phone]  CC: Primary care physician; Kirk Ruths, MDPatient ID: Adrian Gibbon., male   DOB: 10-Jan-1951, 68 y.o.   MRN: [id number]

## 2019-05-27 NOTE — ED Notes (Signed)
Report called to [facility], given to [place].

## 2019-05-27 NOTE — Consult Note (Signed)
Reason for Consult:Seizure Referring Physician: Posey Pronto  CC: Seizure  HPI: Adrian Valdez. is an 68 y.o. male s/p Ativan and unable to provide any history therefore all history obtained from the chart.  Patient is a SNF resident.  Was found by staff on the floor.  Patient was conscious and brought to the ED where he was witnessed to have GTC seizure activity.     Past Medical History:  Diagnosis Date  . 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 B12 deficiency 07/27/2015  . Dementia ([place])   . Diabetes mellitus, type 2 ([place])   . Enlarged prostate   . Glaucoma   . Glaucoma   . Hypertension   . Neuropathy    feet and legs  . Urinary frequency   . Vitamin B 12 deficiency   . Waldenstrom macroglobulinemia ([place])   . Waldenstrom's macroglobulinemia [facility])     Past Surgical History:  Procedure Laterality Date  . CATARACT EXTRACTION W/ INTRAOCULAR LENS IMPLANT    . COLONOSCOPY    . I&D EXTREMITY Bilateral 01/03/2019   Procedure: IRRIGATION AND DEBRIDEMENT right index finger, and left middle finger;  Surgeon: Leanora Cover, MD;  Location: [facility];  Service: Orthopedics;  Laterality: Bilateral;  . KNEE ARTHROSCOPY Right   . LASIK    . PHOTOCOAGULATION WITH LASER Right 05/24/2017   Procedure: PHOTOCOAGULATION WITH LASER;  Surgeon: Leandrew Koyanagi, MD;  Location: [facility];  Service: Ophthalmology;  Laterality: Right;  Diabetic - oral meds    Family History  Problem Relation Age of Onset  . Hypertension Mother   . Hypertension Father   . CAD Father   . Breast cancer Maternal Grandmother   . Ovarian cancer Paternal Grandmother   . Kidney disease Neg Hx   . Prostate cancer Neg Hx     Social History:  reports that he has never smoked. He has never used smokeless tobacco. He reports current alcohol use. He reports that he does not use drugs.  Allergies  Allergen Reactions  . Oxybutynin Other (See Comments)    Headache  . Myrbetriq [Mirabegron] Other (See Comments)     Other reaction(s): Other (See Comments) migraine Hallucinations      Medications:  I have reviewed the patient's current medications. Prior to Admission:  Medications Prior to Admission  Medication Sig Dispense Refill Last Dose  . acetaminophen (TYLENOL) 650 MG CR tablet Take 650 mg by mouth every 6 (six) hours as needed for pain.   Not Taking at Unknown time  . diclofenac sodium (VOLTAREN) 1 % GEL Apply 2 g topically every 4 (four) hours as needed for pain.   Not Taking at Unknown time  . donepezil (ARICEPT) 10 MG tablet Take 10 mg by mouth at bedtime.    Not Taking at Unknown time  . doxycycline (VIBRA-TABS) 100 MG tablet Take 1 tablet (100 mg total) by mouth 2 (two) times daily. (Patient not taking: Reported on 05/27/2019) 20 tablet 0 Completed Course at Unknown time  . finasteride (PROSCAR) 5 MG tablet Take 1 tablet (5 mg total) by mouth daily. Reported on 11/30/2015 (Patient not taking: Reported on 05/27/2019) 90 tablet 3 Not Taking at Unknown time  . fluticasone (FLONASE) 50 MCG/ACT nasal spray Place 2 sprays into both nostrils daily.      Marland Kitchen gabapentin (NEURONTIN) 300 MG capsule Take 300 mg by mouth 3 (three) times daily.   Not Taking at Unknown time  . glimepiride (AMARYL) 2 MG tablet Take 2 mg by mouth  2 (two) times daily.   Not Taking at Unknown time  . guaifenesin (HUMIBID E) 400 MG TABS tablet Take 400 mg by mouth every 6 (six) hours as needed (cough/congestion).   Completed Course at Unknown time  . HYDROcodone-acetaminophen (NORCO/VICODIN) 5-325 MG tablet Take 1 tablet by mouth every 6 (six) hours as needed for moderate pain.   Completed Course at Unknown time  . lisinopril (PRINIVIL,ZESTRIL) 20 MG tablet Take 20 mg by mouth daily. Reported on 11/30/2015   Not Taking at Unknown time  . meloxicam (MOBIC) 15 MG tablet Take 15 mg by mouth daily.   Not Taking at Unknown time  . memantine (NAMENDA) 10 MG tablet Take 10 mg by mouth 2 (two) times daily.   3 Not Taking at Unknown time  .  methazolamide (NEPTAZANE) 50 MG tablet Take 50 mg by mouth daily.   1 Not Taking at Unknown time  . mupirocin ointment (BACTROBAN) 2 % Apply 1 application topically 3 (three) times daily.      . QUEtiapine (SEROQUEL) 25 MG tablet Take 25 mg by mouth 3 (three) times daily.   Not Taking at Unknown time  . sertraline (ZOLOFT) 50 MG tablet Take 50 mg by mouth daily.    Not Taking at Unknown time  . tamsulosin (FLOMAX) 0.4 MG CAPS capsule Take 0.4 mg by mouth daily.    Not Taking at Unknown time  . timolol (BETIMOL) 0.5 % ophthalmic solution Place 1 drop into both eyes 2 (two) times daily.      . Travoprost, BAK Free, (TRAVATAN) 0.004 % SOLN ophthalmic solution Place 1 drop into both eyes at bedtime.      . triamcinolone cream (KENALOG) 0.1 % Apply 1 application topically 2 (two) times daily.     . vitamin B-12 (CYANOCOBALAMIN) 1000 MCG tablet Take 1,000 mcg by mouth. One time every month on the 8th of every month      Scheduled: . docusate sodium  100 mg Oral BID  . enoxaparin (LOVENOX) injection  40 mg Subcutaneous Q24H  . insulin aspart  0-5 Units Subcutaneous QHS  . insulin aspart  0-9 Units Subcutaneous TID WC    ROS: Unable to provide  Physical Examination: Blood pressure 130/85, pulse 78, temperature 97.7 F (36.5 C), temperature source Oral, resp. rate 17, height 5\' 10"  (1.778 m), weight 83.1 kg, SpO2 99 %.  HEENT-  Normocephalic, no lesions, without obvious abnormality.  Normal external eye and conjunctiva.  Normal TM's bilaterally.  Normal auditory canals and external ears. Normal external nose, mucus membranes and septum.  Normal pharynx. Cardiovascular- S1, S2 normal, pulses palpable throughout   Lungs- chest clear, no wheezing, rales, normal symmetric air entry Abdomen- soft, non-tender; bowel sounds normal; no masses,  no organomegaly Extremities- no edema Lymph-no adenopathy palpable Musculoskeletal-no joint tenderness, deformity or swelling Skin-warm and dry, no  hyperpigmentation, vitiligo, or suspicious lesions  Neurological Examination   Mental Status: Lethargic.  Grimaces to deep sternal rub.  No speech.  Does not follow commands.   Cranial Nerves: II: Does not blink to bilateral confrontation, pupils equal, round, reactive to light and accommodation III,IV, VI: Intact oculocephalic maneuver V,VII: Intact corneals bilaterally VIII: unable to test IX,X: unable to test XI: unable to test XII: unable to test Motor/Sensory: Moves all extremities in response to pain, left greater than right.   Deep Tendon Reflexes: Symmetric throughout Plantars: Right: mute   Left: mute Cerebellar: unable to test Gait: not tested due to safety concerns  Laboratory  Studies:   Basic Metabolic Panel: Recent Labs  Lab 05/27/19 0228  NA 139  K 3.0*  CL 101  CO2 26  GLUCOSE 102*  BUN 18  CREATININE 0.94  CALCIUM 8.9    Liver Function Tests: Recent Labs  Lab 05/27/19 0228  AST 21  ALT 15  ALKPHOS 46  BILITOT 0.7  PROT 6.8  ALBUMIN 4.2   No results for input(s): LIPASE, AMYLASE in the last 168 hours. No results for input(s): AMMONIA in the last 168 hours.  CBC: Recent Labs  Lab 05/27/19 0228  WBC 5.6  HGB 14.2  HCT 45.1  MCV 94.4  PLT 189    Cardiac Enzymes: Recent Labs  Lab 05/27/19 0228  CKTOTAL 75    BNP: Invalid input(s): POCBNP  CBG: Recent Labs  Lab 05/27/19 0216 05/27/19 0503 05/27/19 0918 05/27/19 0922 05/27/19 1012  GLUCAP 90 80 62* 58* 111*    Microbiology: Results for orders placed or performed during the hospital encounter of 01/03/19  Aerobic/Anaerobic Culture (surgical/deep wound)     Status: None   Collection Time: 01/03/19 10:55 PM   Specimen: Wound; Abscess  Result Value Ref Range Status   Specimen Description ABSCESS RIGHT FINGER  Final   Special Requests INDEX  Final   Gram Stain   Final    MODERATE WBC PRESENT,BOTH PMN AND MONONUCLEAR ABUNDANT GRAM POSITIVE COCCI IN PAIRS    Culture    Final    ABUNDANT METHICILLIN RESISTANT STAPHYLOCOCCUS AUREUS NO ANAEROBES ISOLATED Performed at [facility], [address] [place], [place] [address]    Report Status 01/09/2019 FINAL  Final   Organism ID, Bacteria METHICILLIN RESISTANT STAPHYLOCOCCUS AUREUS  Final      Susceptibility   Methicillin resistant staphylococcus aureus - MIC*    CIPROFLOXACIN >=8 RESISTANT Resistant     ERYTHROMYCIN >=8 RESISTANT Resistant     GENTAMICIN <=0.5 SENSITIVE Sensitive     OXACILLIN >=4 RESISTANT Resistant     TETRACYCLINE <=1 SENSITIVE Sensitive     VANCOMYCIN 1 SENSITIVE Sensitive     TRIMETH/SULFA <=10 SENSITIVE Sensitive     CLINDAMYCIN <=0.25 SENSITIVE Sensitive     RIFAMPIN <=0.5 SENSITIVE Sensitive     Inducible Clindamycin NEGATIVE Sensitive     * ABUNDANT METHICILLIN RESISTANT STAPHYLOCOCCUS AUREUS  Surgical pcr screen     Status: Abnormal   Collection Time: 01/04/19 10:57 AM   Specimen: Nasal Mucosa; Nasal Swab  Result Value Ref Range Status   MRSA, PCR POSITIVE (A) NEGATIVE Final    Comment: CRITICAL RESULT CALLED TO, READ BACK BY AND VERIFIED WITH: MOORE L AT 1314 ON 01/04/2019 BY SAINVILUS S    Staphylococcus aureus POSITIVE (A) NEGATIVE Final    Comment: (NOTE) The Xpert SA Assay (FDA approved for NASAL specimens in patients 35 years of age and older), is one component of a comprehensive surveillance program. It is not intended to diagnose infection nor to guide or monitor treatment. Performed at [facility], [place] [address] [place], [place] [address]     Coagulation Studies: No results for input(s): LABPROT, INR in the last 72 hours.  Urinalysis:  Recent Labs  Lab 05/27/19 0333  COLORURINE STRAW*  LABSPEC 1.008  PHURINE 6.0  GLUCOSEU NEGATIVE  HGBUR SMALL*  BILIRUBINUR NEGATIVE  KETONESUR NEGATIVE  PROTEINUR NEGATIVE  NITRITE NEGATIVE  LEUKOCYTESUR NEGATIVE    Lipid Panel:  No results found for: CHOL, TRIG, HDL, CHOLHDL, VLDL,  LDLCALC  HgbA1C:  Lab Results  Component  Value Date   HGBA1C 6.0 (H) 05/27/2019    Urine Drug Screen:      Component Value Date/Time   LABOPIA NONE DETECTED 07/01/2015 1724   COCAINSCRNUR NONE DETECTED 07/01/2015 1724   LABBENZ NONE DETECTED 07/01/2015 1724   AMPHETMU NONE DETECTED 07/01/2015 1724   THCU NONE DETECTED 07/01/2015 1724   LABBARB NONE DETECTED 07/01/2015 1724    Alcohol Level: No results for input(s): ETH in the last 168 hours.   Imaging: Ct Head Wo Contrast  Result Date: 05/27/2019 CLINICAL DATA:  Found laying on the floor EXAM: CT HEAD WITHOUT CONTRAST CT CERVICAL SPINE WITHOUT CONTRAST TECHNIQUE: Multidetector CT imaging of the head and cervical spine was performed following the standard protocol without intravenous contrast. Multiplanar CT image reconstructions of the cervical spine were also generated. COMPARISON:  CT 03/01/2019 FINDINGS: CT HEAD FINDINGS Brain: No acute territorial infarction, hemorrhage, or intracranial mass. Advanced atrophy. Mild small vessel ischemic changes of the white matter. Stable ventricle size. Vascular: No hyperdense vessels.  Carotid vascular calcification. Skull: Normal. Negative for fracture or focal lesion. Sinuses/Orbits: Fluid level left maxillary sinus. Mucosal thickening in the ethmoid sinuses Other: Large right posterior parietal scalp hematoma CT CERVICAL SPINE FINDINGS Alignment: No subluxation.  Facet alignment within normal limits. Skull base and vertebrae: No acute fracture. No primary bone lesion or focal pathologic process. Heterotopic ossification between left-sided posterior elements at C3 and C4, chronic. Soft tissues and spinal canal: No prevertebral fluid or swelling. No visible canal hematoma. Disc levels: Degenerative changes of the cervical spine, most marked at C5-C6 and C6-C7. Upper chest: Negative. Other: None IMPRESSION: 1. No CT evidence for acute intracranial abnormality. Atrophy and mild small vessel ischemic  changes of the white matter. Large right posterior scalp hematoma without underlying skull fracture 2. Degenerative changes of the cervical spine. No acute osseous abnormality Electronically Signed   By: Donavan Foil M.D.   On: 05/27/2019 03:05   Ct Cervical Spine Wo Contrast  Result Date: 05/27/2019 CLINICAL DATA:  Found laying on the floor EXAM: CT HEAD WITHOUT CONTRAST CT CERVICAL SPINE WITHOUT CONTRAST TECHNIQUE: Multidetector CT imaging of the head and cervical spine was performed following the standard protocol without intravenous contrast. Multiplanar CT image reconstructions of the cervical spine were also generated. COMPARISON:  CT 03/01/2019 FINDINGS: CT HEAD FINDINGS Brain: No acute territorial infarction, hemorrhage, or intracranial mass. Advanced atrophy. Mild small vessel ischemic changes of the white matter. Stable ventricle size. Vascular: No hyperdense vessels.  Carotid vascular calcification. Skull: Normal. Negative for fracture or focal lesion. Sinuses/Orbits: Fluid level left maxillary sinus. Mucosal thickening in the ethmoid sinuses Other: Large right posterior parietal scalp hematoma CT CERVICAL SPINE FINDINGS Alignment: No subluxation.  Facet alignment within normal limits. Skull base and vertebrae: No acute fracture. No primary bone lesion or focal pathologic process. Heterotopic ossification between left-sided posterior elements at C3 and C4, chronic. Soft tissues and spinal canal: No prevertebral fluid or swelling. No visible canal hematoma. Disc levels: Degenerative changes of the cervical spine, most marked at C5-C6 and C6-C7. Upper chest: Negative. Other: None IMPRESSION: 1. No CT evidence for acute intracranial abnormality. Atrophy and mild small vessel ischemic changes of the white matter. Large right posterior scalp hematoma without underlying skull fracture 2. Degenerative changes of the cervical spine. No acute osseous abnormality Electronically Signed   By: Donavan Foil M.D.    On: 05/27/2019 03:05     Assessment/Plan: 68 year old male with a history of dementia presenting with new  onset seizure activity.  Currently unable to examine extensively due to being sedated with ativan.  Patient loaded with Keppra and has been started on maintenance.  Lab work unrevealing.  Patient afebrile with normal white blood cell count.   Further work up recommended.    Recommendations: 1. May continue Keppra at current dose of 500mg  q 12 hours 2. EEG 3. MRI of the brain with and without contrast.   4. Seizure precautions 5. Serum magnesium and phosphorus  Alexis Goodell, MD Neurology [phone] 05/27/2019, 10:35 AM

## 2019-05-27 NOTE — ED Notes (Signed)
Pt transported to room 107 

## 2019-05-27 NOTE — Discharge Instructions (Addendum)
Epilepsy Epilepsy is when a person keeps having seizures. A seizure is a burst of abnormal activity in the brain. A seizure can change how you think or behave, and it can make it hard to be aware of what is happening. This condition can cause problems such as:  Falls, accidents, and injury.  Sadness (depression).  Poor memory.  Sudden unexplained death in epilepsy (SUDEP). This is rare. Its cause is not known. Most people with epilepsy lead normal lives. What are the causes? This condition may be caused by:  A head injury.  An injury that happens at birth.  A high fever during childhood.  A stroke.  Bleeding that goes into or around the brain.  Certain medicines and drugs.  Having too little oxygen for a long period of time.  Abnormal brain development.  Certain infections.  Brain tumors.  Conditions that are passed from parent to child (are hereditary). What are the signs or symptoms? Symptoms of a seizure vary from person to person. They may include:  Jerky movements of muscles (convulsions).  Stiffening of the body.  Movements of the arms or legs that you are not able to control.  Passing out (loss of consciousness).  Breathing problems.  Sudden falls.  Confusion.  Head nodding.  Eye blinking or twitching.  Lip smacking.  Drooling.  Fast eye movements.  Grunting.  Not being able to control when you pee or poop.  Staring.  Being hard to wake up (unresponsiveness). Some people have symptoms right before a seizure happens (aura) and right after a seizure happens. These symptoms include:  Fear or anxiety.  Feeling sick to your stomach (nauseous).  Feeling like the room is spinning (vertigo).  A feeling of having seen or heard something before (dj vu).  Odd tastes or smells.  Changes in how you see (vision), such as seeing flashing lights or spots. Symptoms that follow a seizure include:  Being confused.  Being sleepy.  Having a  headache. How is this treated? Treatment can control seizures. Treatment for this condition may involve:  Taking medicines to control seizures.  Having a device (vagus nerve stimulator) put in the chest. The device sends signals to a nerve and to the brain to prevent seizures.  Brain surgery to stop seizures from happening or to reduce how often they happen.  Having blood tests often to make sure you are getting the right amount of medicine. Once this condition has been diagnosed, it is important to start treatment as soon as possible. For some people, epilepsy goes away in time. Others will need treatment for the rest of their life. Follow these instructions at home: Medicines  Take over-the-counter and prescription medicines only as told by your doctor.  Avoid anything that may keep your medicine from working, such as alcohol. Activity  Get enough rest. Lack of sleep can make seizures more likely to occur.  Follow your doctor's advice about driving, swimming, and doing anything else that would be dangerous if you had a seizure. ? If you live in the [place] check with your local DMV (department of motor vehicles) to find out about local driving laws. Each state has rules about when you can return to driving. Teaching others Teach friends and family what to do if you have a seizure. They should:  Lay you on the ground to prevent a fall.  Cushion your head and body.  Loosen any tight clothing around your neck.  Turn you on your side.  Stay with   you until you are better.  Not hold you down.  Not put anything in your mouth.  Know whether or not you need emergency care.  General instructions  Avoid anything that causes you to have seizures.  Keep a seizure diary. Write down what you remember about each seizure. Be sure to include what might have caused it.  Keep all follow-up visits as told by your doctor. This is important. Contact a doctor if:  You have a change in how  often or when you have seizures.  You get an infection or start to feel sick. You may have more seizures when you are sick. Get help right away if:  A seizure does not stop after 5 minutes.  You have more than one seizure in a row, and you do not have enough time between the seizures to feel better.  A seizure makes it harder to breathe.  A seizure is different from other seizures you have had.  A seizure makes you unable to speak or use a part of your body.  You did not wake up right after a seizure. These symptoms may be an emergency. Do not wait to see if the symptoms will go away. Get medical help right away. Call your local emergency services (911 in the [place] Do not drive yourself to the hospital. Summary  Epilepsy is when a person keeps having seizures. A seizure is a burst of abnormal activity in the brain.  Treatment can control seizures.  Teach friends and family what to do if you have a seizure. This information is not intended to replace advice given to you by your health care provider. Make sure you discuss any questions you have with your health care provider. Document Released: 06/19/2009 Document Revised: 04/16/2018 Document Reviewed: 04/16/2018 Elsevier Patient Education  2020 Elsevier Inc.  

## 2019-05-27 NOTE — ED Notes (Addendum)
ED TO INPATIENT HANDOFF REPORT  ED Nurse Name and Phone #: Ayslin Kundert [phone]  S Name/Age/Gender Adrian Valdez. 68 y.o. male Room/Bed: ED18A/ED18A  Code Status   Code Status: Full Code  Home/SNF/Other Skilled nursing facility Patient oriented to: self Is this baseline? Yes   Triage Complete: Triage complete  Chief Complaint [facility] - Fall  Triage Note Patient to Rm 18 via EMS from local nsg facility for possible unwitnessed fall.  Per EMS staff said they found patient laying in floor by his bed. Per EMS report they were told by staff that patient refuses to have vital signs taken or to take his medications so usually they just leave him alone.  EMS reports fire dept on scene had a CBG of 70.  CBG on arrival to ED 90.   Patient denies any complaints at this time.     Allergies Allergies  Allergen Reactions  . Oxybutynin Other (See Comments)    Headache  . Myrbetriq [Mirabegron] Other (See Comments)    Other reaction(s): Other (See Comments) migraine Hallucinations      Level of Care/Admitting Diagnosis ED Disposition    ED Disposition Condition [facility] Area: [place] [[id number]]  Level of Care: Med-Surg [16]  Covid Evaluation: Asymptomatic Screening Protocol (No Symptoms)  Diagnosis: Seizure [facility]) [id number]  Admitting Physician: Harrie Foreman [id number]  Attending Physician: Harrie Foreman [id number]  Estimated length of stay: past midnight tomorrow  Certification:: I certify this patient will need inpatient services for at least 2 midnights  Bed request comments: from [place] [facility]; COVID test pending but patient is assymptomatic of respiratory illness  PT Class (Do Not Modify): Inpatient [101]  PT Acc Code (Do Not Modify): Private [1]       B Medical/Surgery History Past Medical History:  Diagnosis Date  . 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 B12 deficiency 07/27/2015  . Dementia ([place])   . Diabetes  mellitus, type 2 ([place])   . Enlarged prostate   . Glaucoma   . Glaucoma   . Hypertension   . Neuropathy    feet and legs  . Urinary frequency   . Vitamin B 12 deficiency   . 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 Waldenstrom's macroglobulinemia [facility])    Past Surgical History:  Procedure Laterality Date  . CATARACT EXTRACTION W/ INTRAOCULAR LENS IMPLANT    . COLONOSCOPY    . I&D EXTREMITY Bilateral 01/03/2019   Procedure: IRRIGATION AND DEBRIDEMENT right index finger, and left middle finger;  Surgeon: Leanora Cover, MD;  Location: [place];  Service: Orthopedics;  Laterality: Bilateral;  . KNEE ARTHROSCOPY Right   . LASIK    . PHOTOCOAGULATION WITH LASER Right 05/24/2017   Procedure: PHOTOCOAGULATION WITH LASER;  Surgeon: Leandrew Koyanagi, MD;  Location: [place];  Service: Ophthalmology;  Laterality: Right;  Diabetic - oral meds     A IV Location/Drains/Wounds Patient Lines/Drains/Airways Status   Active Line/Drains/Airways    Name:   Placement date:   Placement time:   Site:   Days:   Peripheral IV 05/27/19 Right Antecubital   05/27/19    0532    Antecubital   less than 1   Incision (Closed) 05/24/17 Eye Right   05/24/17    0942     733   Incision (Closed) 01/03/19 Arm Right   01/03/19    2259     144   Incision (Closed) 01/03/19 Hand Left  01/03/19    2324     144          Intake/Output Last 24 hours No intake or output data in the 24 hours ending 05/27/19 0733  Labs/Imaging Results for orders placed or performed during the hospital encounter of 05/27/19 (from the past 48 hour(s))  Glucose, capillary     Status: None   Collection Time: 05/27/19  2:16 AM  Result Value Ref Range   Glucose-Capillary 90 70 - 99 mg/dL  CBC     Status: None   Collection Time: 05/27/19  2:28 AM  Result Value Ref Range   WBC 5.6 4.0 - 10.5 K/uL   RBC 4.78 4.22 - 5.81 MIL/uL   Hemoglobin 14.2 13.0 - 17.0 g/dL   HCT 45.1 39.0 - 52.0 %   MCV 94.4 80.0 - 100.0 fL   MCH 29.7 26.0  - 34.0 pg   MCHC 31.5 30.0 - 36.0 g/dL   RDW 13.1 11.5 - 15.5 %   Platelets 189 150 - 400 K/uL   nRBC 0.0 0.0 - 0.2 %    Comment: Performed at [facility], [address] [place], [place] [address]  Comprehensive metabolic panel     Status: Abnormal   Collection Time: 05/27/19  2:28 AM  Result Value Ref Range   Sodium 139 135 - 145 mmol/L   Potassium 3.0 (L) 3.5 - 5.1 mmol/L   Chloride 101 98 - 111 mmol/L   CO2 26 22 - 32 mmol/L   Glucose, Bld 102 (H) 70 - 99 mg/dL   BUN 18 8 - 23 mg/dL   Creatinine, Ser 0.94 0.61 - 1.24 mg/dL   Calcium 8.9 8.9 - 10.3 mg/dL   Total Protein 6.8 6.5 - 8.1 g/dL   Albumin 4.2 3.5 - 5.0 g/dL   AST 21 15 - 41 U/L   ALT 15 0 - 44 U/L   Alkaline Phosphatase 46 38 - 126 U/L   Total Bilirubin 0.7 0.3 - 1.2 mg/dL   GFR calc non Af Amer >60 >60 mL/min   GFR calc Af Amer >60 >60 mL/min   Anion gap 12 5 - 15    Comment: Performed at [facility], [address] [place], [place] [address]  CK     Status: None   Collection Time: 05/27/19  2:28 AM  Result Value Ref Range   Total CK 75 49 - 397 U/L    Comment: Performed at [facility], [address] [place], [place] [address]  Urinalysis, Complete w Microscopic     Status: Abnormal   Collection Time: 05/27/19  3:33 AM  Result Value Ref Range   Color, Urine STRAW (A) YELLOW   APPearance CLEAR (A) CLEAR   Specific Gravity, Urine 1.008 1.005 - 1.030   pH 6.0 5.0 - 8.0   Glucose, UA NEGATIVE NEGATIVE mg/dL   Hgb urine dipstick SMALL (A) NEGATIVE   Bilirubin Urine NEGATIVE NEGATIVE   Ketones, ur NEGATIVE NEGATIVE mg/dL   Protein, ur NEGATIVE NEGATIVE mg/dL   Nitrite NEGATIVE NEGATIVE   Leukocytes,Ua NEGATIVE NEGATIVE   RBC / HPF 0-5 0 - 5 RBC/hpf   WBC, UA 0-5 0 - 5 WBC/hpf   Bacteria, UA NONE SEEN NONE SEEN   Squamous Epithelial / LPF 0-5 0 - 5   Mucus PRESENT     Comment: Performed at [facility], [address] [place], [place] [address]  Glucose,  capillary     Status: None  Collection Time: 05/27/19  5:03 AM  Result Value Ref Range   Glucose-Capillary 80 70 - 99 mg/dL   Ct Head Wo Contrast  Result Date: 05/27/2019 CLINICAL DATA:  Found laying on the floor EXAM: CT HEAD WITHOUT CONTRAST CT CERVICAL SPINE WITHOUT CONTRAST TECHNIQUE: Multidetector CT imaging of the head and cervical spine was performed following the standard protocol without intravenous contrast. Multiplanar CT image reconstructions of the cervical spine were also generated. COMPARISON:  CT 03/01/2019 FINDINGS: CT HEAD FINDINGS Brain: No acute territorial infarction, hemorrhage, or intracranial mass. Advanced atrophy. Mild small vessel ischemic changes of the white matter. Stable ventricle size. Vascular: No hyperdense vessels.  Carotid vascular calcification. Skull: Normal. Negative for fracture or focal lesion. Sinuses/Orbits: Fluid level left maxillary sinus. Mucosal thickening in the ethmoid sinuses Other: Large right posterior parietal scalp hematoma CT CERVICAL SPINE FINDINGS Alignment: No subluxation.  Facet alignment within normal limits. Skull base and vertebrae: No acute fracture. No primary bone lesion or focal pathologic process. Heterotopic ossification between left-sided posterior elements at C3 and C4, chronic. Soft tissues and spinal canal: No prevertebral fluid or swelling. No visible canal hematoma. Disc levels: Degenerative changes of the cervical spine, most marked at C5-C6 and C6-C7. Upper chest: Negative. Other: None IMPRESSION: 1. No CT evidence for acute intracranial abnormality. Atrophy and mild small vessel ischemic changes of the white matter. Large right posterior scalp hematoma without underlying skull fracture 2. Degenerative changes of the cervical spine. No acute osseous abnormality Electronically Signed   By: Donavan Foil M.D.   On: 05/27/2019 03:05   Ct Cervical Spine Wo Contrast  Result Date: 05/27/2019 CLINICAL DATA:  Found laying on the floor  EXAM: CT HEAD WITHOUT CONTRAST CT CERVICAL SPINE WITHOUT CONTRAST TECHNIQUE: Multidetector CT imaging of the head and cervical spine was performed following the standard protocol without intravenous contrast. Multiplanar CT image reconstructions of the cervical spine were also generated. COMPARISON:  CT 03/01/2019 FINDINGS: CT HEAD FINDINGS Brain: No acute territorial infarction, hemorrhage, or intracranial mass. Advanced atrophy. Mild small vessel ischemic changes of the white matter. Stable ventricle size. Vascular: No hyperdense vessels.  Carotid vascular calcification. Skull: Normal. Negative for fracture or focal lesion. Sinuses/Orbits: Fluid level left maxillary sinus. Mucosal thickening in the ethmoid sinuses Other: Large right posterior parietal scalp hematoma CT CERVICAL SPINE FINDINGS Alignment: No subluxation.  Facet alignment within normal limits. Skull base and vertebrae: No acute fracture. No primary bone lesion or focal pathologic process. Heterotopic ossification between left-sided posterior elements at C3 and C4, chronic. Soft tissues and spinal canal: No prevertebral fluid or swelling. No visible canal hematoma. Disc levels: Degenerative changes of the cervical spine, most marked at C5-C6 and C6-C7. Upper chest: Negative. Other: None IMPRESSION: 1. No CT evidence for acute intracranial abnormality. Atrophy and mild small vessel ischemic changes of the white matter. Large right posterior scalp hematoma without underlying skull fracture 2. Degenerative changes of the cervical spine. No acute osseous abnormality Electronically Signed   By: Donavan Foil M.D.   On: 05/27/2019 03:05    Pending Labs Unresulted Labs (From admission, onward)    Start     Ordered   06/03/19 0500  Creatinine, serum  (enoxaparin (LOVENOX)    CrCl >/= 30 ml/min)  Weekly,   STAT    Comments: while on enoxaparin therapy    05/27/19 0716   05/27/19 0717  TSH  Add-on,   AD     05/27/19 0716   05/27/19 0717  Hemoglobin  A1c  Add-on,   AD     05/27/19 0716   05/27/19 0514  SARS CORONAVIRUS 2 (TAT 6-24 HRS) Nasopharyngeal Nasopharyngeal Swab  (Asymptomatic/Tier 2 Patients Labs)  Once,   STAT    Question Answer Comment  Is this test for diagnosis or screening Screening   Symptomatic for COVID-19 as defined by CDC No   Hospitalized for COVID-19 No   Admitted to ICU for COVID-19 No   Previously tested for COVID-19 No   Resident in a congregate (group) care setting Yes   Employed in healthcare setting No      05/27/19 0514          Vitals/Pain Today's Vitals   05/27/19 0530 05/27/19 0557 05/27/19 0600 05/27/19 0630  BP:  118/81 119/76   Pulse: 92 77 77 77  Resp: 15 13 13 14   Temp:      TempSrc:      SpO2: 100% 100% 100% 100%  Weight:      Height:        Isolation Precautions No active isolations  Medications Medications  enoxaparin (LOVENOX) injection 40 mg (has no administration in time range)  0.9 % NaCl with KCl 20 mEq/ L  infusion (has no administration in time range)  acetaminophen (TYLENOL) tablet 650 mg (has no administration in time range)    Or  acetaminophen (TYLENOL) suppository 650 mg (has no administration in time range)  ondansetron (ZOFRAN) tablet 4 mg (has no administration in time range)    Or  ondansetron (ZOFRAN) injection 4 mg (has no administration in time range)  docusate sodium (COLACE) capsule 100 mg (has no administration in time range)  insulin aspart (novoLOG) injection 0-5 Units (has no administration in time range)  insulin aspart (novoLOG) injection 0-9 Units (has no administration in time range)  potassium chloride (KLOR-CON) packet 40 mEq (40 mEq Oral Given 05/27/19 0427)  LORazepam (ATIVAN) injection 1 mg (1 mg Intravenous Given 05/27/19 0510)  levETIRAcetam (KEPPRA) IVPB 1000 mg/100 mL premix (0 mg Intravenous Stopped 05/27/19 0551)    Mobility walks with person assist High fall risk   Focused Assessments    R Recommendations: See Admitting  Provider Note  Report given to:   Additional Notes:   Patient from [facility]

## 2019-05-27 NOTE — ED Notes (Signed)
Patient resting quietly at this time.

## 2019-05-28 ENCOUNTER — Other Ambulatory Visit: Payer: Medicare Other

## 2019-05-28 LAB — GLUCOSE, CAPILLARY
Glucose-Capillary: 82 mg/dL (ref 70–99)
Glucose-Capillary: 100 mg/dL — ABNORMAL HIGH (ref 70–99)
Glucose-Capillary: 121 mg/dL — ABNORMAL HIGH (ref 70–99)
Glucose-Capillary: 80 mg/dL (ref 70–99)

## 2019-05-28 MED ORDER — LORAZEPAM 2 MG/ML IJ SOLN
0.5000 mg | Freq: Two times a day (BID) | INTRAMUSCULAR | Status: DC | PRN
  Administered 2019-05-28: 0.5 mg via INTRAVENOUS
  Filled 2019-05-28: qty 1

## 2019-05-28 MED ORDER — QUETIAPINE FUMARATE 25 MG PO TABS
50.0000 mg | ORAL_TABLET | Freq: Every day | ORAL | Status: DC
  Administered 2019-05-28: 50 mg via ORAL
  Filled 2019-05-28: qty 2

## 2019-05-28 MED ORDER — LORAZEPAM 2 MG/ML IJ SOLN: 0.5000 mg | INTRAMUSCULAR | Status: DC | PRN

## 2019-05-28 MED ORDER — LORAZEPAM 0.5 MG PO TABS
0.5000 mg | ORAL_TABLET | ORAL | Status: DC | PRN
  Administered 2019-05-28: 0.5 mg via ORAL
  Filled 2019-05-28: qty 1

## 2019-05-28 NOTE — TOC Initial Note (Signed)
Transition of Care (TOC) - Initial/Assessment Note    Patient Details  Name: Adrian Valdez. MRN: [id number] Date of Birth: 09-16-1950  Transition of Care [facility]) CM/SW Contact:    Shelbie Hutching, RN Phone Number: 05/28/2019, 9:20 AM  Clinical Narrative:                 Patient brought to the emergency department after a fall and then admitted after having a seizure.  Patient has advanced dementia and is a resident of [facility] assisted living.  Patient's Brother [name] is Economist.  Discharge plan will be for patient to return to [facility].  Patient at baseline can answer basic questions and carry on a conversation, patient can ambulate without assistance.  Patient does need assistance and direction with eating, bathing and dressing.   RNCM will cont to follow patient and coordinate discharge.   Expected Discharge Plan: Assisted Living Barriers to Discharge: Continued Medical Work up   Patient Goals and CMS Choice Patient states their goals for this hospitalization and ongoing recovery are:: Brother [name] is Healthcare POA and would like for the patient to return to [facility]      Expected Discharge Plan and Services Expected Discharge Plan: Assisted Living   Discharge Planning Services: CM Consult   Living arrangements for the past 2 months: [place]                                      Prior Living Arrangements/Services Living arrangements for the past 2 months: [place] Lives with:: Facility Resident Patient language and need for interpreter reviewed:: No Do you feel safe going back to the place where you live?: Yes      Need for Family Participation in Patient Care: Yes (Comment)(advanced Dementia) Care giver support system in place?: Yes (comment)   Criminal Activity/Legal Involvement Pertinent to Current Situation/Hospitalization: No - Comment as needed  Activities of Daily Living Home Assistive Devices/Equipment: None ADL  Screening (condition at time of admission) Patient's cognitive ability adequate to safely complete daily activities?: No Is the patient deaf or have difficulty hearing?: No Does the patient have difficulty seeing, even when wearing glasses/contacts?: No Does the patient have difficulty concentrating, remembering, or making decisions?: Yes Patient able to express need for assistance with ADLs?: Yes Does the patient have difficulty dressing or bathing?: Yes Independently performs ADLs?: No Communication: Needs assistance Is this a change from baseline?: Pre-admission baseline Dressing (OT): Needs assistance Is this a change from baseline?: Pre-admission baseline Grooming: Needs assistance Is this a change from baseline?: Pre-admission baseline Feeding: Needs assistance Is this a change from baseline?: Pre-admission baseline Bathing: Needs assistance Is this a change from baseline?: Pre-admission baseline Toileting: Needs assistance Is this a change from baseline?: Pre-admission baseline In/Out Bed: Independent Walks in Home: Independent Does the patient have difficulty walking or climbing stairs?: No Weakness of Legs: None Weakness of Arms/Hands: None  Permission Sought/Granted Permission sought to share information with : Case Manager, Chartered certified accountant granted to share information with : Yes, Verbal Permission Granted     Permission granted to share info w AGENCY: [place] granted to share info w Relationship: [name] - Brother     Emotional Assessment         Alcohol / Substance Use: Not Applicable Psych Involvement: No (comment)  Admission diagnosis:  Seizure ([place]) [R56.9] Hematoma  of scalp, initial encounter [S00.03XA] Fall, initial encounter [W19.XXXA] Patient Active Problem List   Diagnosis Date Noted  . Seizure ([place]) 05/27/2019  . Cellulitis 01/03/2019  . Necrosis of finger ([place]) 01/03/2019  . Cellulitis of hand 10/30/2018   . Encounter for antineoplastic immunotherapy 11/20/2016  . Encounter for antineoplastic chemotherapy 10/30/2016  . Altered mental status 09/22/2016  . Waldenstrom's macroglobulinemia ([place]) 09/01/2016  . Enlarged prostate with urinary obstruction 08/16/2016  . Prostate cancer screening 07/19/2016  . Right Non-Complex Renal Cyst 07/19/2016  . Dysuria 11/30/2015  . Urgency of urination 11/30/2015  . Nocturia 11/30/2015  . 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 B12 deficiency 07/27/2015  . 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 Confusion state 07/07/2015  . BP (high blood pressure) 07/07/2015  . Major depressive disorder, single episode, moderate ([place]) 07/07/2015  . FOM (frequency of micturition) 07/07/2015   PCP:  Kirk Ruths, MD Pharmacy:   [facility] [id number], [place] [address] Phone: [phone] Fax: [phone]     Social Determinants of Health (SDOH) Interventions    Readmission Risk Interventions No flowsheet data found.

## 2019-05-28 NOTE — Progress Notes (Signed)
Spoke with Dr Brett Albino regarding pts impulsiveness, not able to redirect easily, combative with staff, new order seroquel and ativan placed my Dr Brett Albino

## 2019-05-28 NOTE — Progress Notes (Signed)
Subjective: Patient received more Ativan this morning.  Sedated.  No further seizures documented.   Objective: Current vital signs: BP (!) 161/79 (BP Location: Left Leg)   Pulse 60   Temp 98.3 F (36.8 C) (Axillary)   Resp 19   Ht 5\' 10"  (1.778 m)   Wt 83.1 kg   SpO2 98%   BMI 26.29 kg/m  Vital signs in last 24 hours: Temp:  [96.7 F (35.9 C)-98.3 F (36.8 C)] 98.3 F (36.8 C) (09/22 0329) Pulse Rate:  [60-90] 60 (09/22 0329) Resp:  [16-19] 19 (09/22 0329) BP: (132-177)/(79-96) 161/79 (09/22 0329) SpO2:  [98 %] 98 % (09/22 0329)  Intake/Output from previous day: 09/21 0701 - 09/22 0700 In: 845.5 [I.V.:654.7; IV Piggyback:190.8] Out: 1200 [Urine:1200] Intake/Output this shift: No intake/output data recorded. Nutritional status:  Diet Order            Diet heart healthy/carb modified Room service appropriate? Yes; Fluid consistency: Thin  Diet effective now              Neurologic Exam: Mental Status: Lethargic.  Grimaces to deep sternal rub.  Localizes to pain.  With deep sternal rub is able to say some words.  Does not follow commands.   Cranial Nerves: II: Does not blink to bilateral confrontation, pupils equal, round, reactive to light and accommodation III,IV, VI: Intact oculocephalic maneuver V,VII: Forcibly keeps eyes closed equally.   VIII: unable to test IX,X: unable to test XI: unable to test XII: unable to test Motor/Sensory: Moves all extremities in response to pain   Lab Results: Basic Metabolic Panel: Recent Labs  Lab 05/27/19 0228  NA 139  K 3.0*  CL 101  CO2 26  GLUCOSE 102*  BUN 18  CREATININE 0.94  CALCIUM 8.9  MG 2.1  PHOS 2.9    Liver Function Tests: Recent Labs  Lab 05/27/19 0228  AST 21  ALT 15  ALKPHOS 46  BILITOT 0.7  PROT 6.8  ALBUMIN 4.2   No results for input(s): LIPASE, AMYLASE in the last 168 hours. No results for input(s): AMMONIA in the last 168 hours.  CBC: Recent Labs  Lab 05/27/19 0228  WBC 5.6   HGB 14.2  HCT 45.1  MCV 94.4  PLT 189    Cardiac Enzymes: Recent Labs  Lab 05/27/19 0228  CKTOTAL 75    Lipid Panel: No results for input(s): CHOL, TRIG, HDL, CHOLHDL, VLDL, LDLCALC in the last 168 hours.  CBG: Recent Labs  Lab 05/27/19 1430 05/27/19 1702 05/27/19 2131 05/28/19 0734 05/28/19 1138  GLUCAP 116* 79 111* 82 100*    Microbiology: Results for orders placed or performed during the hospital encounter of 05/27/19  SARS CORONAVIRUS 2 (TAT 6-24 HRS) Nasopharyngeal Nasopharyngeal Swab     Status: None   Collection Time: 05/27/19  5:18 AM   Specimen: Nasopharyngeal Swab  Result Value Ref Range Status   SARS Coronavirus 2 NEGATIVE NEGATIVE Final    Comment: (NOTE) SARS-CoV-2 target nucleic acids are NOT DETECTED. The SARS-CoV-2 RNA is generally detectable in upper and lower respiratory specimens during the acute phase of infection. Negative results do not preclude SARS-CoV-2 infection, do not rule out co-infections with other pathogens, and should not be used as the sole basis for treatment or other patient management decisions. Negative results must be combined with clinical observations, patient history, and epidemiological information. The expected result is Negative. Fact Sheet for Patients: SugarRoll.be Fact Sheet for Healthcare Providers: https://www.woods-mathews.com/ This test is not yet approved  or cleared by the [place] and  has been authorized for detection and/or diagnosis of SARS-CoV-2 by FDA under an Emergency Use Authorization (EUA). This EUA will remain  in effect (meaning this test can be used) for the duration of the COVID-19 declaration under Section 56 4(b)(1) of the Act, 21 U.S.C. section 360bbb-3(b)(1), unless the authorization is terminated or revoked sooner. Performed at [facility], [place] [address] [place], [place] [address]   MRSA PCR Screening     Status: None    Collection Time: 05/27/19 11:04 AM   Specimen: Nasopharyngeal  Result Value Ref Range Status   MRSA by PCR NEGATIVE NEGATIVE Final    Comment:        The GeneXpert MRSA Assay (FDA approved for NASAL specimens only), is one component of a comprehensive MRSA colonization surveillance program. It is not intended to diagnose MRSA infection nor to guide or monitor treatment for MRSA infections. Performed at [facility], [address] [place], [place] [address]     Coagulation Studies: No results for input(s): LABPROT, INR in the last 72 hours.  Imaging: Ct Head Wo Contrast  Result Date: 05/27/2019 CLINICAL DATA:  Found laying on the floor EXAM: CT HEAD WITHOUT CONTRAST CT CERVICAL SPINE WITHOUT CONTRAST TECHNIQUE: Multidetector CT imaging of the head and cervical spine was performed following the standard protocol without intravenous contrast. Multiplanar CT image reconstructions of the cervical spine were also generated. COMPARISON:  CT 03/01/2019 FINDINGS: CT HEAD FINDINGS Brain: No acute territorial infarction, hemorrhage, or intracranial mass. Advanced atrophy. Mild small vessel ischemic changes of the white matter. Stable ventricle size. Vascular: No hyperdense vessels.  Carotid vascular calcification. Skull: Normal. Negative for fracture or focal lesion. Sinuses/Orbits: Fluid level left maxillary sinus. Mucosal thickening in the ethmoid sinuses Other: Large right posterior parietal scalp hematoma CT CERVICAL SPINE FINDINGS Alignment: No subluxation.  Facet alignment within normal limits. Skull base and vertebrae: No acute fracture. No primary bone lesion or focal pathologic process. Heterotopic ossification between left-sided posterior elements at C3 and C4, chronic. Soft tissues and spinal canal: No prevertebral fluid or swelling. No visible canal hematoma. Disc levels: Degenerative changes of the cervical spine, most marked at C5-C6 and C6-C7. Upper chest: Negative. Other:  None IMPRESSION: 1. No CT evidence for acute intracranial abnormality. Atrophy and mild small vessel ischemic changes of the white matter. Large right posterior scalp hematoma without underlying skull fracture 2. Degenerative changes of the cervical spine. No acute osseous abnormality Electronically Signed   By: Donavan Foil M.D.   On: 05/27/2019 03:05   Ct Cervical Spine Wo Contrast  Result Date: 05/27/2019 CLINICAL DATA:  Found laying on the floor EXAM: CT HEAD WITHOUT CONTRAST CT CERVICAL SPINE WITHOUT CONTRAST TECHNIQUE: Multidetector CT imaging of the head and cervical spine was performed following the standard protocol without intravenous contrast. Multiplanar CT image reconstructions of the cervical spine were also generated. COMPARISON:  CT 03/01/2019 FINDINGS: CT HEAD FINDINGS Brain: No acute territorial infarction, hemorrhage, or intracranial mass. Advanced atrophy. Mild small vessel ischemic changes of the white matter. Stable ventricle size. Vascular: No hyperdense vessels.  Carotid vascular calcification. Skull: Normal. Negative for fracture or focal lesion. Sinuses/Orbits: Fluid level left maxillary sinus. Mucosal thickening in the ethmoid sinuses Other: Large right posterior parietal scalp hematoma CT CERVICAL SPINE FINDINGS Alignment: No subluxation.  Facet alignment within normal limits. Skull base and vertebrae: No acute fracture. No primary bone lesion or focal pathologic process. Heterotopic ossification between left-sided  posterior elements at C3 and C4, chronic. Soft tissues and spinal canal: No prevertebral fluid or swelling. No visible canal hematoma. Disc levels: Degenerative changes of the cervical spine, most marked at C5-C6 and C6-C7. Upper chest: Negative. Other: None IMPRESSION: 1. No CT evidence for acute intracranial abnormality. Atrophy and mild small vessel ischemic changes of the white matter. Large right posterior scalp hematoma without underlying skull fracture 2.  Degenerative changes of the cervical spine. No acute osseous abnormality Electronically Signed   By: Donavan Foil M.D.   On: 05/27/2019 03:05   Mr Jeri Cos [id number] Contrast  Result Date: 05/27/2019 CLINICAL DATA:  Hypertension and diabetes. Dementia. Gamma globulin disorder. Found on the floor. EXAM: MRI HEAD WITHOUT AND WITH CONTRAST TECHNIQUE: Multiplanar, multiecho pulse sequences of the brain and surrounding structures were obtained without and with intravenous contrast. CONTRAST:  7.79mL GADAVIST GADOBUTROL 1 MMOL/ML IV SOLN COMPARISON:  Head CT 05/27/2019 FINDINGS: Brain: Diffusion imaging does not show any acute or subacute infarction. Brainstem is normal. 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 Cerebral hemispheres show age related atrophy with mild chronic small-vessel change of the deep white matter. No cortical or large vessel territory infarction. No mass lesion, hemorrhage, hydrocephalus or extra-axial collection. After contrast administration, no abnormal enhancement occurs. Vascular: Major vessels at the base of the brain show flow. Skull and upper cervical spine: Negative Sinuses/Orbits: Small fluid levels in both maxillary sinuses. Orbits negative. Other: Right posterior parietal scalp hematoma. IMPRESSION: Right posterior parietal scalp hematoma. No acute or traumatic intracranial finding. Atrophy and mild chronic small-vessel change. Electronically Signed   By: Nelson Chimes M.D.   On: 05/27/2019 13:45    Medications:  I have reviewed the patient's current medications. Prior to Admission:  Medications Prior to Admission  Medication Sig Dispense Refill Last Dose  . acetaminophen (TYLENOL) 650 MG CR tablet Take 650 mg by mouth every 6 (six) hours as needed for pain.   Not Taking at Unknown time  . diclofenac sodium (VOLTAREN) 1 % GEL Apply 2 g topically every 4 (four) hours as needed for pain.   Not Taking at Unknown time  . donepezil (ARICEPT) 10 MG tablet Take 10 mg by mouth at  bedtime.    Not Taking at Unknown time  . doxycycline (VIBRA-TABS) 100 MG tablet Take 1 tablet (100 mg total) by mouth 2 (two) times daily. (Patient not taking: Reported on 05/27/2019) 20 tablet 0 Completed Course at Unknown time  . finasteride (PROSCAR) 5 MG tablet Take 1 tablet (5 mg total) by mouth daily. Reported on 11/30/2015 (Patient not taking: Reported on 05/27/2019) 90 tablet 3 Not Taking at Unknown time  . fluticasone (FLONASE) 50 MCG/ACT nasal spray Place 2 sprays into both nostrils daily.      Marland Kitchen gabapentin (NEURONTIN) 300 MG capsule Take 300 mg by mouth 3 (three) times daily.   Not Taking at Unknown time  . glimepiride (AMARYL) 2 MG tablet Take 2 mg by mouth 2 (two) times daily.   Not Taking at Unknown time  . guaifenesin (HUMIBID E) 400 MG TABS tablet Take 400 mg by mouth every 6 (six) hours as needed (cough/congestion).   Completed Course at Unknown time  . HYDROcodone-acetaminophen (NORCO/VICODIN) 5-325 MG tablet Take 1 tablet by mouth every 6 (six) hours as needed for moderate pain.   Completed Course at Unknown time  . lisinopril (PRINIVIL,ZESTRIL) 20 MG tablet Take 20 mg by mouth daily. Reported on 11/30/2015   Not Taking at Unknown time  .  meloxicam (MOBIC) 15 MG tablet Take 15 mg by mouth daily.   Not Taking at Unknown time  . memantine (NAMENDA) 10 MG tablet Take 10 mg by mouth 2 (two) times daily.   3 Not Taking at Unknown time  . methazolamide (NEPTAZANE) 50 MG tablet Take 50 mg by mouth daily.   1 Not Taking at Unknown time  . mupirocin ointment (BACTROBAN) 2 % Apply 1 application topically 3 (three) times daily.      . QUEtiapine (SEROQUEL) 25 MG tablet Take 25 mg by mouth 3 (three) times daily.   Not Taking at Unknown time  . sertraline (ZOLOFT) 50 MG tablet Take 50 mg by mouth daily.    Not Taking at Unknown time  . tamsulosin (FLOMAX) 0.4 MG CAPS capsule Take 0.4 mg by mouth daily.    Not Taking at Unknown time  . timolol (BETIMOL) 0.5 % ophthalmic solution Place 1 drop into  both eyes 2 (two) times daily.      . Travoprost, BAK Free, (TRAVATAN) 0.004 % SOLN ophthalmic solution Place 1 drop into both eyes at bedtime.      . triamcinolone cream (KENALOG) 0.1 % Apply 1 application topically 2 (two) times daily.     . vitamin B-12 (CYANOCOBALAMIN) 1000 MCG tablet Take 1,000 mcg by mouth. One time every month on the 8th of every month      Scheduled: . docusate sodium  100 mg Oral BID  . insulin aspart  0-5 Units Subcutaneous QHS  . insulin aspart  0-9 Units Subcutaneous TID WC    Assessment/Plan: Patient sedated but in nursing notes patient alert overnight.  No further seizure activity noted.  MRI unremarkable.  EEG pending.    Recommendations: 1. Continue Keppra at 500mg  q 12 hours 2. Seizure precautions 3. EEG pending   LOS: 1 day   Alexis Goodell, MD Neurology [phone] 05/28/2019  11:50 AM

## 2019-05-28 NOTE — Progress Notes (Signed)
[place] [facility] [place] NAME: Adrian Valdez    MR#:  [id number]  DATE OF BIRTH:  03/09/1951  SUBJECTIVE:   Patient came in from [facility] facility with witnessed generalized tonic clonic seizure which appears to be new. He received earlier Ativan. He has history of dementia and has been intermittently agitated and restless.  Patient today is sedated. Not able to answer any questions. No new seizures weakness REVIEW OF SYSTEMS:   Review of Systems  Unable to perform ROS: Mental status change   Tolerating Diet: Tolerating PT:   DRUG ALLERGIES:   Allergies  Allergen Reactions  . Oxybutynin Other (See Comments)    Headache  . Myrbetriq [Mirabegron] Other (See Comments)    Other reaction(s): Other (See Comments) migraine Hallucinations      VITALS:  Blood pressure (!) 161/79, pulse 60, temperature 98.3 F (36.8 C), temperature source Axillary, resp. rate 19, height 5\' 10"  (1.778 m), weight 83.1 kg, SpO2 98 %.  PHYSICAL EXAMINATION:   Physical Exam limited exam patient is sedated GENERAL:  68 y.o.-year-old patient lying in the bed with no acute distress. Appears chronically ill EYES: Pupils equal, round, reactive to light and accommodation. No scleral icterus. Extraocular muscles intact.  HEENT: Head atraumatic, normocephalic. Oropharynx and nasopharynx clear.  NECK:  Supple, no jugular venous distention. No thyroid enlargement, no tenderness.  LUNGS: Normal breath sounds bilaterally, no wheezing, rales, rhonchi. No use of accessory muscles of respiration.  CARDIOVASCULAR: S1, S2 normal. No murmurs, rubs, or gallops.  ABDOMEN: Soft, nontender, nondistended. Bowel sounds present. No organomegaly or mass.  EXTREMITIES: No cyanosis, clubbing or edema b/l.    NEUROLOGIC: patient sedated PSYCHIATRIC:  patient is lethargic from sedation SKIN: No obvious rash, lesion, or ulcer.   LABORATORY PANEL:  CBC Recent Labs  Lab 05/27/19 0228  WBC  5.6  HGB 14.2  HCT 45.1  PLT 189    Chemistries  Recent Labs  Lab 05/27/19 0228  NA 139  K 3.0*  CL 101  CO2 26  GLUCOSE 102*  BUN 18  CREATININE 0.94  CALCIUM 8.9  MG 2.1  AST 21  ALT 15  ALKPHOS 46  BILITOT 0.7   Cardiac Enzymes No results for input(s): TROPONINI in the last 168 hours. RADIOLOGY:  Ct Head Wo Contrast  Result Date: 05/27/2019 CLINICAL DATA:  Found laying on the floor EXAM: CT HEAD WITHOUT CONTRAST CT CERVICAL SPINE WITHOUT CONTRAST TECHNIQUE: Multidetector CT imaging of the head and cervical spine was performed following the standard protocol without intravenous contrast. Multiplanar CT image reconstructions of the cervical spine were also generated. COMPARISON:  CT 03/01/2019 FINDINGS: CT HEAD FINDINGS Brain: No acute territorial infarction, hemorrhage, or intracranial mass. Advanced atrophy. Mild small vessel ischemic changes of the white matter. Stable ventricle size. Vascular: No hyperdense vessels.  Carotid vascular calcification. Skull: Normal. Negative for fracture or focal lesion. Sinuses/Orbits: Fluid level left maxillary sinus. Mucosal thickening in the ethmoid sinuses Other: Large right posterior parietal scalp hematoma CT CERVICAL SPINE FINDINGS Alignment: No subluxation.  Facet alignment within normal limits. Skull base and vertebrae: No acute fracture. No primary bone lesion or focal pathologic process. Heterotopic ossification between left-sided posterior elements at C3 and C4, chronic. Soft tissues and spinal canal: No prevertebral fluid or swelling. No visible canal hematoma. Disc levels: Degenerative changes of the cervical spine, most marked at C5-C6 and C6-C7. Upper chest: Negative. Other: None IMPRESSION: 1. No CT evidence for acute intracranial abnormality. Atrophy  and mild small vessel ischemic changes of the white matter. Large right posterior scalp hematoma without underlying skull fracture 2. Degenerative changes of the cervical spine. No acute  osseous abnormality Electronically Signed   By: Donavan Foil M.D.   On: 05/27/2019 03:05   Ct Cervical Spine Wo Contrast  Result Date: 05/27/2019 CLINICAL DATA:  Found laying on the floor EXAM: CT HEAD WITHOUT CONTRAST CT CERVICAL SPINE WITHOUT CONTRAST TECHNIQUE: Multidetector CT imaging of the head and cervical spine was performed following the standard protocol without intravenous contrast. Multiplanar CT image reconstructions of the cervical spine were also generated. COMPARISON:  CT 03/01/2019 FINDINGS: CT HEAD FINDINGS Brain: No acute territorial infarction, hemorrhage, or intracranial mass. Advanced atrophy. Mild small vessel ischemic changes of the white matter. Stable ventricle size. Vascular: No hyperdense vessels.  Carotid vascular calcification. Skull: Normal. Negative for fracture or focal lesion. Sinuses/Orbits: Fluid level left maxillary sinus. Mucosal thickening in the ethmoid sinuses Other: Large right posterior parietal scalp hematoma CT CERVICAL SPINE FINDINGS Alignment: No subluxation.  Facet alignment within normal limits. Skull base and vertebrae: No acute fracture. No primary bone lesion or focal pathologic process. Heterotopic ossification between left-sided posterior elements at C3 and C4, chronic. Soft tissues and spinal canal: No prevertebral fluid or swelling. No visible canal hematoma. Disc levels: Degenerative changes of the cervical spine, most marked at C5-C6 and C6-C7. Upper chest: Negative. Other: None IMPRESSION: 1. No CT evidence for acute intracranial abnormality. Atrophy and mild small vessel ischemic changes of the white matter. Large right posterior scalp hematoma without underlying skull fracture 2. Degenerative changes of the cervical spine. No acute osseous abnormality Electronically Signed   By: Donavan Foil M.D.   On: 05/27/2019 03:05   Mr Jeri Cos [id number] Contrast  Result Date: 05/27/2019 CLINICAL DATA:  Hypertension and diabetes. Dementia. Gamma globulin disorder.  Found on the floor. EXAM: MRI HEAD WITHOUT AND WITH CONTRAST TECHNIQUE: Multiplanar, multiecho pulse sequences of the brain and surrounding structures were obtained without and with intravenous contrast. CONTRAST:  7.38mL GADAVIST GADOBUTROL 1 MMOL/ML IV SOLN COMPARISON:  Head CT 05/27/2019 FINDINGS: Brain: Diffusion imaging does not show any acute or subacute infarction. Brainstem is normal. 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 Cerebral hemispheres show age related atrophy with mild chronic small-vessel change of the deep white matter. No cortical or large vessel territory infarction. No mass lesion, hemorrhage, hydrocephalus or extra-axial collection. After contrast administration, no abnormal enhancement occurs. Vascular: Major vessels at the base of the brain show flow. Skull and upper cervical spine: Negative Sinuses/Orbits: Small fluid levels in both maxillary sinuses. Orbits negative. Other: Right posterior parietal scalp hematoma. IMPRESSION: Right posterior parietal scalp hematoma. No acute or traumatic intracranial finding. Atrophy and mild chronic small-vessel change. Electronically Signed   By: Nelson Chimes M.D.   On: 05/27/2019 13:45   ASSESSMENT AND PLAN:  Woodford Saulter. is a 68 y.o. male with a history of advanced dementia, diabetes, hypertension, Waldenstrom macroglobulinemia, anemia who presents for evaluation of a unwitnessed mechanical fall.  Patient was found on the floor next to his bed.  Unclear how long patient was on the floor. Per EMS staff at the nursing facility witness generalized tonic clonic seizures. He transitively became hypoxic in the 30s for few seconds and was bad with immediate resolution of hypoxia  1. Generalized tonic clonic seizure new onset, unclear etiology -CT had negative for stroke. Posterior parietal scalp hematoma present on the right -MRI brain right posterior parietal  scalp hematoma -patient was seen by neurology Dr. Doy Mince. Recommends  continue IV Keppra 500 mg BID -EEG pending -PRN Ativan-- inch does into BID PRN to avoid excessive sedation.  2. Relative hypoglycemia inpatient with diabetes -sugars remaining in the 50s and 60s. On  D5 drip--d/c once po intake better 3. Hypokalemia replete potassium  4. Unwitnessed fall with right posterior parietal scalp hematoma -avoid antiplatelet agent. DC Lovenox  5. Type II diabetes-- holding home meds and insulin given her low sugar  6. History of dementia, chronic will resume chronic dementia meds once patient is more awake and able to take orally.  Palliative care consultation placed. Spoke with Jinny Blossom  According to Education officer, museum patient will return back to [facility]. Anticipated discharge tomorrow if patient is awake and eating well.   CODE STATUS: full  DVT Prophylaxis: SCD  TOTAL TIME TAKING CARE OF THIS PATIENT: 30* minutes.  >50% time spent on counselling and coordination of care  POSSIBLE D/C IN  1-2 DAYS, DEPENDING ON CLINICAL CONDITION.  Note: This dictation was prepared with Dragon dictation along with smaller phrase technology. Any transcriptional errors that result from this process are unintentional.  Fritzi Mandes M.D on 05/28/2019 at 11:55 AM  Between 7am to 6pm - Pager - [phone]  After 6pm go to [email or web address] - password [email or web address] [facility]  Office  [phone]  CC: Primary care physician; Kirk Ruths, MDPatient ID: [name], male   DOB: [birth year], 68 y.o.   MRN: [id number]

## 2019-05-28 NOTE — Progress Notes (Signed)
eeg completed ° °

## 2019-05-28 NOTE — Progress Notes (Signed)
Received report from [place].  She stated that Dr Posey Pronto decreased the patient's ativan order to 0.5 mg every 12 hours because she thought the patient was over sedated.  The patient was up all last night and the two doses of ativan 1 mg IV that I gave was ineffective.  I asked the hospitalist on call Dr Jannifer Franklin for Seroquel as pt has been on this medication in the past but it was not ordered.  Pt removed IV and condom cath during my shift yesterday. Pt was given keppra IV yesterday at 4 AM and a bath at which time he finally calmed down but prior to that pt was awake and attempting to get out of bed, aggressive, and unable to be redirected for the entire night until 430 AM.  At change of shift today, pt has removed IV and condom cath and is now wet with urine. Attempted to get pt up to ambulate in hopes of tiring him out so that he would rest but pt is unable to stand, very unsteady and unable to follow directions.  Charge nurse Estill Bamberg spoke with Dr Brett Albino and Seroquel was ordered. Crushed Seroquel and mixed in apple juice.  Gave to patient by syringe to mouth. Pt continues to pull at sheets and throw feet over edge of bed. IV team here to do IV but pt will not keep arm straight and is fighting. Bed alarm is on and pt is in a low bed for safety.  Will continue to monitor closely. Dorna Bloom RN

## 2019-05-28 NOTE — Procedures (Signed)
ELECTROENCEPHALOGRAM REPORT   Patient: Adrian Valdez.       Room #: 107A-AA EEG No. ID: [id number] Age: 68 y.o.        Sex: male Referring Physician: Posey Pronto Report Date:  05/28/2019        Interpreting Physician: Alexis Goodell  History: Jamee E Shaquiel Westover. is an 68 y.o. male with new onset seizures  Medications:  Insulin, Keppra, Ativan  Conditions of Recording:  This is a 21 channel routine scalp EEG performed with bipolar and monopolar montages arranged in accordance to the international 10/20 system of electrode placement. One channel was dedicated to EKG recording.  The patient is in the altered state.  Description:  The background activity is very low voltage.  It is slow and poorly organized consisting mostly of polymorphic delta activity that is often difficult to ascertain due to the overlying beta activity that is diffusely distributed and continuous throughout the recording.   No epileptiform activity is noted.   Hyperventilation was not performed.  Intermittent photic stimulation was performed but failed to illicit any change in the tracing.     IMPRESSION: This is an abnormal EEG secondary to general background slowing.  This finding may be seen with a diffuse disturbance that is etiologically nonspecific, but may include a metabolic encephalopathy, among other possibilities.  No epileptiform activity was noted.   The fast beta activity noted throughout the recording is consistent with a medication effect.     Alexis Goodell, MD Neurology [phone] 05/28/2019, 2:08 PM

## 2019-05-29 DIAGNOSIS — S0003XA Contusion of scalp, initial encounter: Secondary | ICD-10-CM

## 2019-05-29 DIAGNOSIS — Z7189 Other specified counseling: Secondary | ICD-10-CM

## 2019-05-29 DIAGNOSIS — W19XXXA Unspecified fall, initial encounter: Secondary | ICD-10-CM

## 2019-05-29 LAB — GLUCOSE, CAPILLARY
Glucose-Capillary: 169 mg/dL — ABNORMAL HIGH (ref 70–99)
Glucose-Capillary: 167 mg/dL — ABNORMAL HIGH (ref 70–99)
Glucose-Capillary: 162 mg/dL — ABNORMAL HIGH (ref 70–99)
Glucose-Capillary: 125 mg/dL — ABNORMAL HIGH (ref 70–99)

## 2019-05-29 MED ORDER — MELOXICAM 7.5 MG PO TABS: 15.0000 mg | ORAL_TABLET | Freq: Every day | ORAL | Status: DC

## 2019-05-29 MED ORDER — HYDRALAZINE HCL 20 MG/ML IJ SOLN: 10.0000 mg | Freq: Four times a day (QID) | INTRAMUSCULAR | Status: DC | PRN

## 2019-05-29 MED ORDER — LEVETIRACETAM 100 MG/ML PO SOLN
500.0000 mg | Freq: Two times a day (BID) | ORAL | Status: DC
  Administered 2019-05-29 – 2019-06-06 (×16): 500 mg via ORAL
  Filled 2019-05-29 (×18): qty 5

## 2019-05-29 MED ORDER — GABAPENTIN 300 MG PO CAPS: 300.0000 mg | ORAL_CAPSULE | Freq: Three times a day (TID) | ORAL | Status: DC

## 2019-05-29 MED ORDER — TIMOLOL MALEATE 0.5 % OP SOLN
1.0000 [drp] | Freq: Two times a day (BID) | OPHTHALMIC | Status: DC
  Administered 2019-05-29 – 2019-06-14 (×27): 1 [drp] via OPHTHALMIC
  Filled 2019-05-29: qty 5

## 2019-05-29 MED ORDER — DONEPEZIL HCL 5 MG PO TABS
10.0000 mg | ORAL_TABLET | Freq: Every day | ORAL | Status: DC
  Administered 2019-05-29 – 2019-06-13 (×16): 10 mg via ORAL
  Filled 2019-05-29 (×17): qty 2

## 2019-05-29 MED ORDER — SERTRALINE HCL 50 MG PO TABS: 50.0000 mg | ORAL_TABLET | Freq: Every day | ORAL | Status: DC

## 2019-05-29 MED ORDER — LATANOPROST 0.005 % OP SOLN
1.0000 [drp] | Freq: Every day | OPHTHALMIC | Status: DC
  Administered 2019-05-29 – 2019-06-14 (×14): 1 [drp] via OPHTHALMIC
  Filled 2019-05-29: qty 2.5

## 2019-05-29 MED ORDER — LISINOPRIL 20 MG PO TABS
20.0000 mg | ORAL_TABLET | Freq: Every day | ORAL | Status: DC
  Administered 2019-05-29: 20 mg via ORAL
  Filled 2019-05-29: qty 1

## 2019-05-29 MED ORDER — GUAIFENESIN 400 MG PO TABS: 400.0000 mg | ORAL_TABLET | Freq: Four times a day (QID) | ORAL | Status: DC | PRN

## 2019-05-29 MED ORDER — RISPERIDONE 0.25 MG PO TABS
0.2500 mg | ORAL_TABLET | Freq: Four times a day (QID) | ORAL | Status: DC | PRN
  Filled 2019-05-29: qty 1

## 2019-05-29 MED ORDER — HYDROCODONE-ACETAMINOPHEN 5-325 MG PO TABS: 1.0000 | ORAL_TABLET | Freq: Four times a day (QID) | ORAL | Status: DC | PRN

## 2019-05-29 MED ORDER — LORAZEPAM 2 MG/ML IJ SOLN
1.0000 mg | INTRAMUSCULAR | Status: DC | PRN
  Administered 2019-05-29: 17:00:00 1 mg via INTRAVENOUS
  Filled 2019-05-29 (×2): qty 1

## 2019-05-29 MED ORDER — FLUTICASONE PROPIONATE 50 MCG/ACT NA SUSP: 2.0000 | Freq: Every day | NASAL | Status: DC

## 2019-05-29 MED ORDER — HYDRALAZINE HCL 10 MG PO TABS
10.0000 mg | ORAL_TABLET | Freq: Four times a day (QID) | ORAL | Status: DC
  Administered 2019-05-29 – 2019-05-31 (×5): 10 mg via ORAL
  Filled 2019-05-29 (×14): qty 1

## 2019-05-29 MED ORDER — TAMSULOSIN HCL 0.4 MG PO CAPS
0.4000 mg | ORAL_CAPSULE | Freq: Every day | ORAL | Status: DC
  Administered 2019-05-31 – 2019-06-08 (×9): 0.4 mg via ORAL
  Filled 2019-05-29 (×8): qty 1

## 2019-05-29 MED ORDER — METHAZOLAMIDE 25 MG PO TABS: 50.0000 mg | ORAL_TABLET | Freq: Every day | ORAL | Status: DC

## 2019-05-29 MED ORDER — VITAMIN B-12 1000 MCG PO TABS
1000.0000 ug | ORAL_TABLET | Freq: Every day | ORAL | Status: DC
  Administered 2019-06-13 – 2019-06-14 (×2): 1000 ug via ORAL
  Filled 2019-05-29 (×2): qty 1

## 2019-05-29 MED ORDER — MEMANTINE HCL 5 MG PO TABS
10.0000 mg | ORAL_TABLET | Freq: Two times a day (BID) | ORAL | Status: DC
  Administered 2019-05-29 – 2019-06-14 (×31): 10 mg via ORAL
  Filled 2019-05-29 (×31): qty 2

## 2019-05-29 NOTE — Consult Note (Signed)
[facility] Consult   Reason for Consult:  Agitation, pulling IVs Referring Physician:  vipul shah Patient Identification: Adrian Valdez. MRN:  [id number] Principal Diagnosis: <principal problem not specified> Diagnosis:  Active Problems:   Seizure ([place])   Total Time spent with patient: 30 minutes  Subjective:   Adrian Valdez. is a 68 y.o. male patient admitted with seizures, subdural hemotoma.  HPI: Encounter limited by patient's physical state.  Patient is a 68 year old male with history of dementia who presents status post seizure and noted to have subdural hematoma upon imaging.  Per report patient has been agitated and been pulling out lines including IV line.  Yesterday per per chart review patient was given 50 mg of Seroquel.  Per nursing report the Seroquel had little to no effect as patient was agitated through the night.  Patient was quite sedated this morning after being given a dose of lorazepam.  Nurse noted lorazepam had good effect on patient.  Patient was seen at the bedside landing on his right side and nonresponsive to voice.  Patient was called by name but only barely responded with garbled noises.  Further attempts to arouse patient were unsuccessful.  Past Psychiatric History: History of MDD Risk to Self:  No Risk to Others:  No Prior Inpatient Therapy:  unknown Prior Outpatient Therapy:  Unknown  Past Medical History:  Past Medical History:  Diagnosis Date  . 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 B12 deficiency 07/27/2015  . Dementia ([place])   . Diabetes mellitus, type 2 ([place])   . Enlarged prostate   . Glaucoma   . Glaucoma   . Hypertension   . Neuropathy    feet and legs  . Urinary frequency   . Vitamin B 12 deficiency   . Waldenstrom macroglobulinemia ([place])   . Waldenstrom's macroglobulinemia [facility])     Past Surgical History:  Procedure Laterality Date  . CATARACT EXTRACTION W/ INTRAOCULAR LENS IMPLANT    . COLONOSCOPY     . I&D EXTREMITY Bilateral 01/03/2019   Procedure: IRRIGATION AND DEBRIDEMENT right index finger, and left middle finger;  Surgeon: Leanora Cover, MD;  Location: [place] [facility];  Service: Orthopedics;  Laterality: Bilateral;  . KNEE ARTHROSCOPY Right   . LASIK    . PHOTOCOAGULATION WITH LASER Right 05/24/2017   Procedure: PHOTOCOAGULATION WITH LASER;  Surgeon: Leandrew Koyanagi, MD;  Location: [place];  Service: Ophthalmology;  Laterality: Right;  Diabetic - oral meds   Family History:  Family History  Problem Relation Age of Onset  . Hypertension Mother   . Hypertension Father   . CAD Father   . Breast cancer Maternal Grandmother   . Ovarian cancer Paternal Grandmother   . Kidney disease Neg Hx   . Prostate cancer Neg Hx    Family Psychiatric  History:  Social History:  Social History   Substance and Sexual Activity  Alcohol Use Yes   Comment: social drinker wine couple of times a year     Social History   Substance and Sexual Activity  Drug Use No    Social History   Socioeconomic History  . Marital status: Single    Spouse name: Not on file  . Number of children: Not on file  . Years of education: Not on file  . Highest education level: Not on file  Occupational History  . Not on file  Social Needs  . Financial resource strain: Not on file  . Food insecurity  Worry: Not on file    Inability: Not on file  . Transportation needs    Medical: Not on file    Non-medical: Not on file  Tobacco Use  . Smoking status: Never Smoker  . Smokeless tobacco: Never Used  Substance and Sexual Activity  . Alcohol use: Yes    Comment: social drinker wine couple of times a year  . Drug use: No  . Sexual activity: Not on file  Lifestyle  . Physical activity    Days per week: Not on file    Minutes per session: Not on file  . Stress: Not on file  Relationships  . Social Herbalist on phone: Not on file    Gets together: Not on file    Attends religious  service: Not on file    Active member of club or organization: Not on file    Attends meetings of clubs or organizations: Not on file    Relationship status: Not on file  Other Topics Concern  . Not on file  Social History Narrative   From [place], [place]   Additional Social History:    Allergies:   Allergies  Allergen Reactions  . Oxybutynin Other (See Comments)    Headache  . Myrbetriq [Mirabegron] Other (See Comments)    Other reaction(s): Other (See Comments) migraine Hallucinations      Labs:  Results for orders placed or performed during the hospital encounter of 05/27/19 (from the past 48 hour(s))  Glucose, capillary     Status: Abnormal   Collection Time: 05/27/19 10:12 AM  Result Value Ref Range   Glucose-Capillary 111 (H) 70 - 99 mg/dL  MRSA PCR Screening     Status: None   Collection Time: 05/27/19 11:04 AM   Specimen: Nasopharyngeal  Result Value Ref Range   MRSA by PCR NEGATIVE NEGATIVE    Comment:        The GeneXpert MRSA Assay (FDA approved for NASAL specimens only), is one component of a comprehensive MRSA colonization surveillance program. It is not intended to diagnose MRSA infection nor to guide or monitor treatment for MRSA infections. Performed at [facility], [address] [place], [place] [address]   Glucose, capillary     Status: Abnormal   Collection Time: 05/27/19 12:23 PM  Result Value Ref Range   Glucose-Capillary 66 (L) 70 - 99 mg/dL  Glucose, capillary     Status: None   Collection Time: 05/27/19 12:25 PM  Result Value Ref Range   Glucose-Capillary 70 70 - 99 mg/dL  Glucose, capillary     Status: Abnormal   Collection Time: 05/27/19  1:39 PM  Result Value Ref Range   Glucose-Capillary 60 (L) 70 - 99 mg/dL  Urine Drug Screen, Qualitative ([facility] only)     Status: None   Collection Time: 05/27/19  1:45 PM  Result Value Ref Range   Tricyclic, Ur Screen NONE DETECTED NONE DETECTED   Amphetamines, Ur Screen NONE  DETECTED NONE DETECTED   MDMA (Ecstasy)Ur Screen NONE DETECTED NONE DETECTED   Cocaine Metabolite,Ur [place] NONE DETECTED NONE DETECTED   Opiate, Ur Screen NONE DETECTED NONE DETECTED   Phencyclidine (PCP) Ur S NONE DETECTED NONE DETECTED   Cannabinoid 50 Ng, Ur [place] NONE DETECTED NONE DETECTED   Barbiturates, Ur Screen NONE DETECTED NONE DETECTED   Benzodiazepine, Ur Scrn NONE DETECTED NONE DETECTED   Methadone Scn, Ur NONE DETECTED NONE DETECTED    Comment: (NOTE) Tricyclics +  metabolites, urine    Cutoff 1000 ng/mL Amphetamines + metabolites, urine  Cutoff 1000 ng/mL MDMA (Ecstasy), urine              Cutoff 500 ng/mL Cocaine Metabolite, urine          Cutoff 300 ng/mL Opiate + metabolites, urine        Cutoff 300 ng/mL Phencyclidine (PCP), urine         Cutoff 25 ng/mL Cannabinoid, urine                 Cutoff 50 ng/mL Barbiturates + metabolites, urine  Cutoff 200 ng/mL Benzodiazepine, urine              Cutoff 200 ng/mL Methadone, urine                   Cutoff 300 ng/mL The urine drug screen provides only a preliminary, unconfirmed analytical test result and should not be used for non-medical purposes. Clinical consideration and professional judgment should be applied to any positive drug screen result due to possible interfering substances. A more specific alternate chemical method must be used in order to obtain a confirmed analytical result. Gas chromatography / mass spectrometry (GC/MS) is the preferred confirmat ory method. Performed at [facility], [address] [place], [place] [address]   Glucose, capillary     Status: Abnormal   Collection Time: 05/27/19  2:30 PM  Result Value Ref Range   Glucose-Capillary 116 (H) 70 - 99 mg/dL  Glucose, capillary     Status: None   Collection Time: 05/27/19  5:02 PM  Result Value Ref Range   Glucose-Capillary 79 70 - 99 mg/dL  Glucose, capillary     Status: Abnormal   Collection Time: 05/27/19  9:31 PM  Result Value  Ref Range   Glucose-Capillary 111 (H) 70 - 99 mg/dL  Glucose, capillary     Status: None   Collection Time: 05/28/19  7:34 AM  Result Value Ref Range   Glucose-Capillary 82 70 - 99 mg/dL  Glucose, capillary     Status: Abnormal   Collection Time: 05/28/19 11:38 AM  Result Value Ref Range   Glucose-Capillary 100 (H) 70 - 99 mg/dL   Comment 1 Notify RN   Glucose, capillary     Status: Abnormal   Collection Time: 05/28/19  5:04 PM  Result Value Ref Range   Glucose-Capillary 121 (H) 70 - 99 mg/dL  Glucose, capillary     Status: None   Collection Time: 05/28/19  8:46 PM  Result Value Ref Range   Glucose-Capillary 80 70 - 99 mg/dL   Comment 1 Notify RN   Glucose, capillary     Status: Abnormal   Collection Time: 05/29/19  7:50 AM  Result Value Ref Range   Glucose-Capillary 169 (H) 70 - 99 mg/dL    Current Facility-Administered Medications  Medication Dose Route Frequency Provider Last Rate Last Dose  . acetaminophen (TYLENOL) tablet 650 mg  650 mg Oral Q6H PRN Harrie Foreman, MD       Or  . acetaminophen (TYLENOL) suppository 650 mg  650 mg Rectal Q6H PRN Harrie Foreman, MD      . dextrose 5 % and 0.9 % NaCl with KCl 20 mEq/L infusion   Intravenous Continuous Fritzi Mandes, MD 50 mL/hr at 05/29/19 [phone]    . docusate sodium (COLACE) capsule 100 mg  100 mg Oral BID Harrie Foreman, MD      .  donepezil (ARICEPT) tablet 10 mg  10 mg Oral QHS Harrie Foreman, MD      . hydrALAZINE (APRESOLINE) tablet 10 mg  10 mg Oral Q6H Shah, Vipul, MD      . insulin aspart (novoLOG) injection 0-5 Units  0-5 Units Subcutaneous QHS Harrie Foreman, MD      . insulin aspart (novoLOG) injection 0-9 Units  0-9 Units Subcutaneous TID WC Harrie Foreman, MD   2 Units at 05/29/19 0805  . latanoprost (XALATAN) 0.005 % ophthalmic solution 1 drop  1 drop Both Eyes QHS Harrie Foreman, MD      . levETIRAcetam (KEPPRA) 100 MG/ML solution 500 mg  500 mg Oral BID Max Sane, MD   500 mg at  05/29/19 0523  . lisinopril (ZESTRIL) tablet 20 mg  20 mg Oral Daily Harrie Foreman, MD   20 mg at 05/29/19 [id number]  . LORazepam (ATIVAN) injection 1 mg  1 mg Intravenous Q4H PRN Max Sane, MD      . memantine [facility]) tablet 10 mg  10 mg Oral BID Harrie Foreman, MD      . ondansetron [facility]) tablet 4 mg  4 mg Oral Q6H PRN Harrie Foreman, MD       Or  . ondansetron [facility]) injection 4 mg  4 mg Intravenous Q6H PRN Harrie Foreman, MD      . risperiDONE (RISPERDAL) tablet 0.25 mg  0.25 mg Oral Q6H PRN Lachrisha Ziebarth, Dorene Ar, MD      . tamsulosin (FLOMAX) capsule 0.4 mg  0.4 mg Oral Daily Harrie Foreman, MD      . timolol (TIMOPTIC) 0.5 % ophthalmic solution 1 drop  1 drop Both Eyes BID Harrie Foreman, MD      . Derrill Memo ON 06/13/2019] vitamin B-12 (CYANOCOBALAMIN) tablet 1,000 mcg  1,000 mcg Oral Daily Harrie Foreman, MD        Musculoskeletal: Strength & Muscle Tone: decreased Gait & Station: unable to stand Patient leans: N/A  Psychiatric Specialty Exam: Physical Exam  Review of Systems  Constitutional: Positive for malaise/fatigue.  Neurological: Positive for seizures and weakness.  Psychiatric/Behavioral: The patient is nervous/anxious and has insomnia.     Blood pressure (!) 160/102, pulse 86, temperature 98.6 F (37 C), resp. rate 16, height 5\' 10"  (1.778 m), weight 83.1 kg, SpO2 99 %.Body mass index is 26.29 kg/m.  General Appearance:laying  In bed, cachetic looking.  Eye Contact:  Absent  Speech:  NA  Volume:  NA  Mood:  NA  Affect:  Restricted  Thought Process:  NA  Orientation:  NA  Thought Content:  NA  Suicidal Thoughts:  NA  Homicidal Thoughts:  NA  Memory: NA  Judgement:  NA  Insight:  NA  Psychomotor Activity:  Decreased                           Treatment Plan Summary: Medication management  -Keep order for lorazepam 1mg  q4 hrs prn seizure/ anxiety - Seroquel changed to Risperdione 0.25mg  PO  q6hrs PRN  Agitation   Disposition: No evidence of imminent risk to self or others at present.   Patient will benefit from medication management of agitation in order to receive appropriate medical services.   Dixie Dials, MD 05/29/2019 9:53 AM

## 2019-05-29 NOTE — Progress Notes (Signed)
Pt still struggling to get up, unable to redirect. Dr Jannifer Franklin notified that pt has no IV access and order given for po ativan.  Pt given po ativan 0.5 mg by mouth with apple juice. 0100 Pt somewhat relaxed and staying in bed.  Frequently setting off alarm as he is moving in bed. Oral care provided. Marland Kitchen  Applied foam dressing to elbows as becoming red from moving in bed constantly.  0300 Pt is staying in bed and resting. Dorna Bloom RN

## 2019-05-29 NOTE — Progress Notes (Signed)
Adrian Valdez at [place] NAME: Adrian Valdez    MR#:  [id number]  DATE OF BIRTH:  1951/03/12  SUBJECTIVE:  Patient came in from [place] facility with witnessed generalized tonic clonic seizure which appears to be new. He has history of dementia and has been intermittently agitated and restless.  Per nursing he has intermittent periods of severe agitation REVIEW OF SYSTEMS:   Review of Systems  Unable to perform ROS: Mental status change   Tolerating Diet: Tolerating PT:   DRUG ALLERGIES:   Allergies  Allergen Reactions  . Oxybutynin Other (See Comments)    Headache  . Myrbetriq [Mirabegron] Other (See Comments)    Other reaction(s): Other (See Comments) migraine Hallucinations      VITALS:  Blood pressure (!) 158/97, pulse 88, temperature 98 F (36.7 C), temperature source Oral, resp. rate 18, height 5\' 10"  (1.778 m), weight 83.1 kg, SpO2 98 %.  PHYSICAL EXAMINATION:   Physical Exam limited exam patient is sedated GENERAL:  68 y.o.-year-old patient lying in the bed with no acute distress. Appears chronically ill EYES: Pupils equal, round, reactive to light and accommodation. No scleral icterus. Extraocular muscles intact.  HEENT: Head atraumatic, normocephalic. Oropharynx and nasopharynx clear.  NECK:  Supple, no jugular venous distention. No thyroid enlargement, no tenderness.  LUNGS: Normal breath sounds bilaterally, no wheezing, rales, rhonchi. No use of accessory muscles of respiration.  CARDIOVASCULAR: S1, S2 normal. No murmurs, rubs, or gallops.  ABDOMEN: Soft, nontender, nondistended. Bowel sounds present. No organomegaly or mass.  EXTREMITIES: No cyanosis, clubbing or edema b/l.    NEUROLOGIC: patient sedated PSYCHIATRIC:  patient is lethargic from sedation SKIN: No obvious rash, lesion, or ulcer.   LABORATORY PANEL:  CBC Recent Labs  Lab 05/27/19 0228  WBC 5.6  HGB 14.2  HCT 45.1  PLT 189    Chemistries   Recent Labs  Lab 05/27/19 0228  NA 139  K 3.0*  CL 101  CO2 26  GLUCOSE 102*  BUN 18  CREATININE 0.94  CALCIUM 8.9  MG 2.1  AST 21  ALT 15  ALKPHOS 46  BILITOT 0.7   Cardiac Enzymes No results for input(s): TROPONINI in the last 168 hours. RADIOLOGY:  Mr Jeri Cos [id number] Contrast  Result Date: 05/27/2019 CLINICAL DATA:  Hypertension and diabetes. Dementia. Gamma globulin disorder. Found on the floor. EXAM: MRI HEAD WITHOUT AND WITH CONTRAST TECHNIQUE: Multiplanar, multiecho pulse sequences of the brain and surrounding structures were obtained without and with intravenous contrast. CONTRAST:  7.57mL GADAVIST GADOBUTROL 1 MMOL/ML IV SOLN COMPARISON:  Head CT 05/27/2019 FINDINGS: Brain: Diffusion imaging does not show any acute or subacute infarction. Brainstem is normal. 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 Cerebral hemispheres show age related atrophy with mild chronic small-vessel change of the deep white matter. No cortical or large vessel territory infarction. No mass lesion, hemorrhage, hydrocephalus or extra-axial collection. After contrast administration, no abnormal enhancement occurs. Vascular: Major vessels at the base of the brain show flow. Skull and upper cervical spine: Negative Sinuses/Orbits: Small fluid levels in both maxillary sinuses. Orbits negative. Other: Right posterior parietal scalp hematoma. IMPRESSION: Right posterior parietal scalp hematoma. No acute or traumatic intracranial finding. Atrophy and mild chronic small-vessel change. Electronically Signed   By: Nelson Chimes M.D.   On: 05/27/2019 13:45   ASSESSMENT AND PLAN:  Adrian Nile. is a 68 y.o. male with a history of advanced dementia, diabetes, hypertension, Waldenstrom macroglobulinemia, anemia  admitted after unwitnessed mechanical fall.  Patient was found on the floor next to his bed.  Unclear how long patient was on the floor. Per EMS staff at the nursing facility witness generalized tonic  clonic seizures. He transitively became hypoxic in the 30s for few seconds and was bad with immediate resolution of hypoxia  1. Generalized tonic clonic seizure new onset, unclear etiology -CT had negative for stroke. Posterior parietal scalp hematoma present on the right -MRI brain right posterior parietal scalp hematoma -patient was seen by neurology Dr. Doy Mince. Recommends continue IV Keppra 500 mg BID -EEG did not show any new seizure -PRN Ativan -Seizure precaution  2. Relative hypoglycemia inpatient with history of diabetes -Blood sugar better controlled at this time  3.  Agitation -Psych consultation - -Recommends lorazepam 1mg  q4 hrs prn seizure/ anxiety - Seroquel changed to Risperdione 0.25mg  PO  q6hrs PRN Agitation  4. Unwitnessed fall with right posterior parietal scalp hematoma -avoid antiplatelet agent. DC Lovenox  5. Hypokalemia replete potassium  6. History of dementia, chronic will resume chronic dementia meds once patient is more awake and able to take orally.  Palliative care  Megan had discussion with patient's son who would like aggressive care and full code for now  According to social worker patient will return back to [facility].    CODE STATUS: full  DVT Prophylaxis: SCD  TOTAL TIME TAKING CARE OF THIS PATIENT: 30 minutes.  >50% time spent on counselling and coordination of care  POSSIBLE D/C IN  1-2 DAYS, DEPENDING ON CLINICAL CONDITION.  Note: This dictation was prepared with Dragon dictation along with smaller phrase technology. Any transcriptional errors that result from this process are unintentional.  Max Sane M.D on 05/29/2019 at 12:37 PM  Between 7am to 6pm - Pager - [phone]  After 6pm go to [email or web address] - password [email or web address] [facility]  Office  [phone]  CC: Primary care physician; Kirk Ruths, MDPatient ID: Adrian Gibbon., male   DOB: Dec 12, 1950, 68 y.o.   MRN: [id number]

## 2019-05-29 NOTE — Consult Note (Signed)
Consultation Note Date: 05/29/19  Patient Name: Adrian Valdez.  DOB: 06-04-1951  MRN: [id number]  Age / Sex: 68 y.o., male  PCP: Kirk Ruths, MD Referring Physician: Max Sane, MD  Reason for Consultation: Establishing goals of care  HPI/Patient Profile: 68 y.o. male  with past medical history of dementia, Waldenstrom's macroglobulinemia, neuropathy, hypertension, DM type 2, ascending aorta enlargement admitted on 05/27/2019 after being found down on floor at memory care unit. Witnessed generalized tonic clonic seizure in ED. Neurology following. MRI brain right posterior parietal scalp hematoma. EEG did not show seizures. Receiving IV Keppra. Palliative medicine consultation for goals of care.    Clinical Assessment and Goals of Care:  I have reviewed medical records, discussed with care team, and assessed the patient at bedside. He is currently sleeping and appears comfortable. Nursing staff reports periods of agitation and impulsiveness. No family at bedside.   Patient has living will documentation and brother, [name] is HCPOA. Spoke with [name] via telephone to discuss goals of care.   I introduced Palliative Medicine as specialized medical care for people living with serious illness. It focuses on providing relief from the symptoms and stress of a serious illness. The goal is to improve quality of life for both the patient and the family.  We discussed a brief life review of the patient. Adrian Valdez was diagnosed with dementia in 2016. He lives at [facility] facility. Baseline prior to admission, Adrian Valdez is able to ambulate and feed himself with good appetite. He requires encouragement and direction for ADL's.   Discussed course of admission including diagnoses, interventions, test results and and plan of care.   [name] shares that multiple family members have dementia (some deceased) and he  understands the progressive, irreversible nature of this condition.   I attempted to elicit values and goals of care important to the patient and brother. Advanced directives, concepts specific to code status and artifical feeding and hydration were discussed. At this point, [name] shares that he would like for resuscitation to be attempted but acknowledges he would not want prolonged interventions if Adrian Valdez was not getting better. Educated provided on medical recommendation for limitations to care including DNR with underlying irreversible condition.   Explained psych recommendations. [name] is hopeful that Adrian Valdez will do better once he is back to his familiar environment at [facility].   [name] is also curious about oncology follow-up and labs. Notes reviewed. Patient was last seen in oncology clinic 05/2018. At that time, they recommended patient return to clinic in 6 months for follow-up and labs. Patient likely did not return to clinic during covid-19 pandemic. Reassured brother that I would notify attending of his request for oncology follow-up.   Questions and concerns were addressed. Brother declines outpatient palliative services at this time but understands he can request outpatient palliative at facility at any time.    SUMMARY OF RECOMMENDATIONS    Continue FULL code/FULL scope treatment.   Brother, [name], is documented HCPOA. [name] desires FULL code and  resuscitation to be attempted. [name] does acknowledge he would not want prolonged heroic interventions if Adrian Valdez was not showing improvement.  Brother declined outpatient palliative services at this time.   Code Status/Advance Care Planning:  Full code  Symptom Management:   Per attending and psych  Palliative Prophylaxis:   Aspiration, Delirium Protocol, Oral Care and Turn Reposition  Additional Recommendations (Limitations, Scope, Preferences):  Full Scope Treatment  Psycho-social/Spiritual:   Desire for further Chaplaincy  support:yes  Additional Recommendations: Caregiving  Support/Resources  Prognosis:   Unable to determine  Discharge Planning: Back to memory care facility [facility]) when stable.     Primary Diagnoses: Present on Admission: **None**   I have reviewed the medical record, interviewed the patient and family, and examined the patient. The following aspects are pertinent.  Past Medical History:  Diagnosis Date  . Ascending aorta enlargement ([place]) 08/18/2015  . B12 deficiency 07/27/2015  . Dementia ([place])   . Diabetes mellitus, type 2 ([place])   . Enlarged prostate   . Glaucoma   . Glaucoma   . Hypertension   . Neuropathy    feet and legs  . Urinary frequency   . Vitamin B 12 deficiency   . Waldenstrom macroglobulinemia ([place])   . Waldenstrom's macroglobulinemia [facility])    Social History   Socioeconomic History  . Marital status: Single    Spouse name: Not on file  . Number of children: Not on file  . Years of education: Not on file  . Highest education level: Not on file  Occupational History  . Not on file  Social Needs  . Financial resource strain: Not on file  . Food insecurity    Worry: Not on file    Inability: Not on file  . Transportation needs    Medical: Not on file    Non-medical: Not on file  Tobacco Use  . Smoking status: Never Smoker  . Smokeless tobacco: Never Used  Substance and Sexual Activity  . Alcohol use: Yes    Comment: social drinker wine couple of times a year  . Drug use: No  . Sexual activity: Not on file  Lifestyle  . Physical activity    Days per week: Not on file    Minutes per session: Not on file  . Stress: Not on file  Relationships  . Social Herbalist on phone: Not on file    Gets together: Not on file    Attends religious service: Not on file    Active member of club or organization: Not on file    Attends meetings of clubs or organizations: Not on file    Relationship status: Not on file  Other Topics  Concern  . Not on file  Social History Narrative   From [place], [place]   Family History  Problem Relation Age of Onset  . Hypertension Mother   . Hypertension Father   . CAD Father   . Breast cancer Maternal Grandmother   . Ovarian cancer Paternal Grandmother   . Kidney disease Neg Hx   . Prostate cancer Neg Hx    Scheduled Meds: . docusate sodium  100 mg Oral BID  . donepezil  10 mg Oral QHS  . hydrALAZINE  10 mg Oral Q6H  . insulin aspart  0-5 Units Subcutaneous QHS  . insulin aspart  0-9 Units Subcutaneous TID WC  . latanoprost  1 drop Both Eyes QHS  . levETIRAcetam  500 mg Oral BID  .  lisinopril  20 mg Oral Daily  . memantine  10 mg Oral BID  . tamsulosin  0.4 mg Oral Daily  . timolol  1 drop Both Eyes BID  . [START ON 06/13/2019] vitamin B-12  1,000 mcg Oral Daily   Continuous Infusions: . dextrose 5 % and 0.9 % NaCl with KCl 20 mEq/L 50 mL/hr at 05/29/19 0819   PRN Meds:.acetaminophen **OR** acetaminophen, LORazepam, ondansetron **OR** ondansetron (ZOFRAN) IV, risperiDONE Medications Prior to Admission:  Prior to Admission medications   Medication Sig Start Date End Date Taking? Authorizing Provider  acetaminophen (TYLENOL) 650 MG CR tablet Take 650 mg by mouth every 6 (six) hours as needed for pain.    [provider]  diclofenac sodium (VOLTAREN) 1 % GEL Apply 2 g topically every 4 (four) hours as needed for pain.    [provider]  donepezil (ARICEPT) 10 MG tablet Take 10 mg by mouth at bedtime.  09/20/16 01/03/19  [provider]  fluticasone (FLONASE) 50 MCG/ACT nasal spray Place 2 sprays into both nostrils daily.  05/02/18 05/02/19  [provider]  gabapentin (NEURONTIN) 300 MG capsule Take 300 mg by mouth 3 (three) times daily.    [provider]  glimepiride (AMARYL) 2 MG tablet Take 2 mg by mouth 2 (two) times daily. 12/22/16 01/03/19  [provider]  guaifenesin (HUMIBID E) 400 MG TABS tablet Take 400 mg  by mouth every 6 (six) hours as needed (cough/congestion).    [provider]  HYDROcodone-acetaminophen (NORCO/VICODIN) 5-325 MG tablet Take 1 tablet by mouth every 6 (six) hours as needed for moderate pain.    [provider]  lisinopril (PRINIVIL,ZESTRIL) 20 MG tablet Take 20 mg by mouth daily. Reported on 11/30/2015    [provider]  meloxicam (MOBIC) 15 MG tablet Take 15 mg by mouth daily.    [provider]  memantine (NAMENDA) 10 MG tablet Take 10 mg by mouth 2 (two) times daily.  03/09/18   [provider]  methazolamide (NEPTAZANE) 50 MG tablet Take 50 mg by mouth daily.  07/15/15   [provider]  mupirocin ointment (BACTROBAN) 2 % Apply 1 application topically 3 (three) times daily.     [provider]  QUEtiapine (SEROQUEL) 25 MG tablet Take 25 mg by mouth 3 (three) times daily.    [provider]  sertraline (ZOLOFT) 50 MG tablet Take 50 mg by mouth daily.     [provider]  tamsulosin (FLOMAX) 0.4 MG CAPS capsule Take 0.4 mg by mouth daily.     [provider]  timolol (BETIMOL) 0.5 % ophthalmic solution Place 1 drop into both eyes 2 (two) times daily.     [provider]  Travoprost, BAK Free, (TRAVATAN) 0.004 % SOLN ophthalmic solution Place 1 drop into both eyes at bedtime.     [provider]  triamcinolone cream (KENALOG) 0.1 % Apply 1 application topically 2 (two) times daily.    [provider]  vitamin B-12 (CYANOCOBALAMIN) 1000 MCG tablet Take 1,000 mcg by mouth. One time every month on the 8th of every month    [provider]   Allergies  Allergen Reactions  . Oxybutynin Other (See Comments)    Headache  . Myrbetriq [Mirabegron] Other (See Comments)    Other reaction(s): Other (See Comments) migraine Hallucinations     Review of Systems  Unable to perform ROS: Dementia   Physical Exam Vitals signs and nursing note reviewed.  Constitutional:      Appearance: He is ill-appearing.  Pulmonary:     Effort: No tachypnea, accessory muscle usage or respiratory distress.  Skin:    General: Skin is warm and dry.  Neurological:     Mental Status: He is lethargic.  Psychiatric:     Comments: Periods of impulsivity requiring prn ativan    Vital Signs: BP (!) 158/97 (BP Location: Left Arm)   Pulse 88   Temp 98 F (36.7 C) (Oral)   Resp 18   Ht 5\' 10"  (1.778 m)   Wt 83.1 kg   SpO2 98%   BMI 26.29 kg/m  Pain Scale: PAINAD POSS *See Group Information*: 1-Acceptable,Awake and alert Pain Score: 0-No pain   SpO2: SpO2: 98 % O2 Device:SpO2: 98 % O2 Flow Rate: .   IO: Intake/output summary:   Intake/Output Summary (Last 24 hours) at 05/29/2019 1059 Last data filed at 05/29/2019 [id number] Gross per 24 hour  Intake 2038.05 ml  Output 450 ml  Net 1588.05 ml    LBM: Last BM Date: 05/27/19 Baseline Weight: Weight: 83.1 kg Most recent weight: Weight: 83.1 kg     Palliative Assessment/Data: PPS 50%     Time In: 1000 Time Out: 1100 Time Total: 34min Greater than 50%  of this time was spent counseling and coordinating care related to the above assessment and plan.  Signed by:  Ihor Dow, DNP, FNP-C Palliative Medicine Team  Phone: [phone] Fax: [phone]   Please contact Palliative Medicine Team phone at [phone] for questions and concerns.  For individual provider: See Shea Evans

## 2019-05-29 NOTE — Progress Notes (Addendum)
Pt blood pressure is 160/102 map 120.  Notified Dr Marcille Blanco via text page.  Pt has hx of htn and has taken lisinopril 20 mg daily in past. Dorna Bloom RN Order received to start lisinopril now.  Also noted that other medication from prior to admission restarted. Dorna Bloom RN

## 2019-05-29 NOTE — Progress Notes (Signed)
Patient remains confused and difficult to re-direct,close monitoring in progress

## 2019-05-29 NOTE — Progress Notes (Signed)
Subjective: Patient received more Ativan yesterday and late last noght.  Sedated.  No further seizures documented.  Is very agitated when not sedated per nursing.    Objective: Current vital signs: BP (!) 158/97 (BP Location: Left Arm)   Pulse 88   Temp 98 F (36.7 C) (Oral)   Resp 18   Ht 5\' 10"  (1.778 m)   Wt 83.1 kg   SpO2 98%   BMI 26.29 kg/m  Vital signs in last 24 hours: Temp:  [97.8 F (36.6 C)-98.6 F (37 C)] 98 F (36.7 C) (09/23 1000) Pulse Rate:  [73-88] 88 (09/23 1000) Resp:  [16-18] 18 (09/23 1000) BP: (158-183)/(89-102) 158/97 (09/23 1000) SpO2:  [85 %-99 %] 98 % (09/23 1000)  Intake/Output from previous day: 09/22 0701 - 09/23 0700 In: 2038.1 [P.O.:1200; I.V.:738.1; IV Piggyback:100] Out: 450 [Urine:450] Intake/Output this shift: No intake/output data recorded. Nutritional status:  Diet Order            Diet heart healthy/carb modified Room service appropriate? Yes; Fluid consistency: Thin  Diet effective now              Neurologic Exam: Mental Status: Lethargic.  Grimaces to deep sternal rub.  Localizes to pain.  With deep sternal rub is able to say some words.  Does not follow commands.   Cranial Nerves: II: Does not blink to bilateral confrontation, pupils equal, round, reactive to light and accommodation III,IV, VI: Intact oculocephalic maneuver V,VII: Forcibly keeps eyes closed equally.   VIII: unable to test IX,X: unable to test XI: unable to test XII: unable to test Motor/Sensory: Moves all extremities in response to pain   Lab Results: Basic Metabolic Panel: Recent Labs  Lab 05/27/19 0228  NA 139  K 3.0*  CL 101  CO2 26  GLUCOSE 102*  BUN 18  CREATININE 0.94  CALCIUM 8.9  MG 2.1  PHOS 2.9    Liver Function Tests: Recent Labs  Lab 05/27/19 0228  AST 21  ALT 15  ALKPHOS 46  BILITOT 0.7  PROT 6.8  ALBUMIN 4.2   No results for input(s): LIPASE, AMYLASE in the last 168 hours. No results for input(s): AMMONIA in  the last 168 hours.  CBC: Recent Labs  Lab 05/27/19 0228  WBC 5.6  HGB 14.2  HCT 45.1  MCV 94.4  PLT 189    Cardiac Enzymes: Recent Labs  Lab 05/27/19 0228  CKTOTAL 75    Lipid Panel: No results for input(s): CHOL, TRIG, HDL, CHOLHDL, VLDL, LDLCALC in the last 168 hours.  CBG: Recent Labs  Lab 05/28/19 0734 05/28/19 1138 05/28/19 1704 05/28/19 2046 05/29/19 0750  GLUCAP 82 100* 121* 80 169*    Microbiology: Results for orders placed or performed during the hospital encounter of 05/27/19  SARS CORONAVIRUS 2 (TAT 6-24 HRS) Nasopharyngeal Nasopharyngeal Swab     Status: None   Collection Time: 05/27/19  5:18 AM   Specimen: Nasopharyngeal Swab  Result Value Ref Range Status   SARS Coronavirus 2 NEGATIVE NEGATIVE Final    Comment: (NOTE) SARS-CoV-2 target nucleic acids are NOT DETECTED. The SARS-CoV-2 RNA is generally detectable in upper and lower respiratory specimens during the acute phase of infection. Negative results do not preclude SARS-CoV-2 infection, do not rule out co-infections with other pathogens, and should not be used as the sole basis for treatment or other patient management decisions. Negative results must be combined with clinical observations, patient history, and epidemiological information. The expected result is Negative. Fact Sheet  for Patients: SugarRoll.be Fact Sheet for Healthcare Providers: https://www.woods-mathews.com/ This test is not yet approved or cleared by the [place] FDA and  has been authorized for detection and/or diagnosis of SARS-CoV-2 by FDA under an Emergency Use Authorization (EUA). This EUA will remain  in effect (meaning this test can be used) for the duration of the COVID-19 declaration under Section 56 4(b)(1) of the Act, 21 U.S.C. section 360bbb-3(b)(1), unless the authorization is terminated or revoked sooner. Performed at [facility], [place] [address]  [place], [place] [address]   MRSA PCR Screening     Status: None   Collection Time: 05/27/19 11:04 AM   Specimen: Nasopharyngeal  Result Value Ref Range Status   MRSA by PCR NEGATIVE NEGATIVE Final    Comment:        The GeneXpert MRSA Assay (FDA approved for NASAL specimens only), is one component of a comprehensive MRSA colonization surveillance program. It is not intended to diagnose MRSA infection nor to guide or monitor treatment for MRSA infections. Performed at [facility], [address] [place], [place] [address]     Coagulation Studies: No results for input(s): LABPROT, INR in the last 72 hours.  Imaging: Mr Jeri Cos [id number] Contrast  Result Date: 05/27/2019 CLINICAL DATA:  Hypertension and diabetes. Dementia. Gamma globulin disorder. Found on the floor. EXAM: MRI HEAD WITHOUT AND WITH CONTRAST TECHNIQUE: Multiplanar, multiecho pulse sequences of the brain and surrounding structures were obtained without and with intravenous contrast. CONTRAST:  7.69mL GADAVIST GADOBUTROL 1 MMOL/ML IV SOLN COMPARISON:  Head CT 05/27/2019 FINDINGS: Brain: Diffusion imaging does not show any acute or subacute infarction. Brainstem is normal. 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 Cerebral hemispheres show age related atrophy with mild chronic small-vessel change of the deep white matter. No cortical or large vessel territory infarction. No mass lesion, hemorrhage, hydrocephalus or extra-axial collection. After contrast administration, no abnormal enhancement occurs. Vascular: Major vessels at the base of the brain show flow. Skull and upper cervical spine: Negative Sinuses/Orbits: Small fluid levels in both maxillary sinuses. Orbits negative. Other: Right posterior parietal scalp hematoma. IMPRESSION: Right posterior parietal scalp hematoma. No acute or traumatic intracranial finding. Atrophy and mild chronic small-vessel change. Electronically Signed   By: Nelson Chimes M.D.   On:  05/27/2019 13:45    Medications:  I have reviewed the patient's current medications. Prior to Admission:  Medications Prior to Admission  Medication Sig Dispense Refill Last Dose  . acetaminophen (TYLENOL) 650 MG CR tablet Take 650 mg by mouth every 6 (six) hours as needed for pain.   Not Taking at Unknown time  . diclofenac sodium (VOLTAREN) 1 % GEL Apply 2 g topically every 4 (four) hours as needed for pain.   Not Taking at Unknown time  . donepezil (ARICEPT) 10 MG tablet Take 10 mg by mouth at bedtime.    Not Taking at Unknown time  . fluticasone (FLONASE) 50 MCG/ACT nasal spray Place 2 sprays into both nostrils daily.      Marland Kitchen gabapentin (NEURONTIN) 300 MG capsule Take 300 mg by mouth 3 (three) times daily.   Not Taking at Unknown time  . glimepiride (AMARYL) 2 MG tablet Take 2 mg by mouth 2 (two) times daily.   Not Taking at Unknown time  . guaifenesin (HUMIBID E) 400 MG TABS tablet Take 400 mg by mouth every 6 (six) hours as needed (cough/congestion).   Completed Course at Unknown time  . HYDROcodone-acetaminophen (NORCO/VICODIN) 5-325  MG tablet Take 1 tablet by mouth every 6 (six) hours as needed for moderate pain.   Completed Course at Unknown time  . lisinopril (PRINIVIL,ZESTRIL) 20 MG tablet Take 20 mg by mouth daily. Reported on 11/30/2015   Not Taking at Unknown time  . meloxicam (MOBIC) 15 MG tablet Take 15 mg by mouth daily.   Not Taking at Unknown time  . memantine (NAMENDA) 10 MG tablet Take 10 mg by mouth 2 (two) times daily.   3 Not Taking at Unknown time  . methazolamide (NEPTAZANE) 50 MG tablet Take 50 mg by mouth daily.   1 Not Taking at Unknown time  . mupirocin ointment (BACTROBAN) 2 % Apply 1 application topically 3 (three) times daily.      . QUEtiapine (SEROQUEL) 25 MG tablet Take 25 mg by mouth 3 (three) times daily.   Not Taking at Unknown time  . sertraline (ZOLOFT) 50 MG tablet Take 50 mg by mouth daily.    Not Taking at Unknown time  . tamsulosin (FLOMAX) 0.4 MG  CAPS capsule Take 0.4 mg by mouth daily.    Not Taking at Unknown time  . timolol (BETIMOL) 0.5 % ophthalmic solution Place 1 drop into both eyes 2 (two) times daily.      . Travoprost, BAK Free, (TRAVATAN) 0.004 % SOLN ophthalmic solution Place 1 drop into both eyes at bedtime.      . triamcinolone cream (KENALOG) 0.1 % Apply 1 application topically 2 (two) times daily.     . vitamin B-12 (CYANOCOBALAMIN) 1000 MCG tablet Take 1,000 mcg by mouth. One time every month on the 8th of every month      Scheduled: . docusate sodium  100 mg Oral BID  . donepezil  10 mg Oral QHS  . hydrALAZINE  10 mg Oral Q6H  . insulin aspart  0-5 Units Subcutaneous QHS  . insulin aspart  0-9 Units Subcutaneous TID WC  . latanoprost  1 drop Both Eyes QHS  . levETIRAcetam  500 mg Oral BID  . lisinopril  20 mg Oral Daily  . memantine  10 mg Oral BID  . tamsulosin  0.4 mg Oral Daily  . timolol  1 drop Both Eyes BID  . [START ON 06/13/2019] vitamin B-12  1,000 mcg Oral Daily    Assessment/Plan: Patient currently sedated with Ativan.  Agitated when not sedated.  No further seizure activity noted.  MRI unremarkable.  EEG shows no epileptiform activity.      Recommendations: 1. Continue Keppra at 500mg  q 12 hours 2. Seizure precautions 3. Agree with psych evaluation.     LOS: 2 days   Alexis Goodell, MD Neurology [phone] 05/29/2019  11:28 AM

## 2019-05-30 DIAGNOSIS — W19XXXA Unspecified fall, initial encounter: Secondary | ICD-10-CM

## 2019-05-30 DIAGNOSIS — S0003XA Contusion of scalp, initial encounter: Secondary | ICD-10-CM

## 2019-05-30 DIAGNOSIS — F03918 Unspecified dementia, unspecified severity, with other behavioral disturbance: Secondary | ICD-10-CM

## 2019-05-30 DIAGNOSIS — F0391 Unspecified dementia with behavioral disturbance: Secondary | ICD-10-CM

## 2019-05-30 DIAGNOSIS — Z515 Encounter for palliative care: Secondary | ICD-10-CM

## 2019-05-30 LAB — BASIC METABOLIC PANEL
Anion gap: 15 (ref 5–15)
BUN: 17 mg/dL (ref 8–23)
CO2: 22 mmol/L (ref 22–32)
Calcium: 9 mg/dL (ref 8.9–10.3)
Chloride: 99 mmol/L (ref 98–111)
Creatinine, Ser: 1.89 mg/dL — ABNORMAL HIGH (ref 0.61–1.24)
GFR calc Af Amer: 42 mL/min — ABNORMAL LOW (ref >60)
GFR calc non Af Amer: 36 mL/min — ABNORMAL LOW (ref >60)
Glucose, Bld: 179 mg/dL — ABNORMAL HIGH (ref 70–99)
Potassium: 4 mmol/L (ref 3.5–5.1)
Sodium: 136 mmol/L (ref 135–145)

## 2019-05-30 LAB — CBC
HCT: 46.4 % (ref 39.0–52.0)
Hemoglobin: 16 g/dL (ref 13.0–17.0)
MCH: 30 pg (ref 26.0–34.0)
MCHC: 34.5 g/dL (ref 30.0–36.0)
MCV: 87.1 fL (ref 80.0–100.0)
Platelets: 259 10*3/uL (ref 150–400)
RBC: 5.33 MIL/uL (ref 4.22–5.81)
RDW: 12.9 % (ref 11.5–15.5)
WBC: 13.1 10*3/uL — ABNORMAL HIGH (ref 4.0–10.5)
nRBC: 0 % (ref 0.0–0.2)

## 2019-05-30 LAB — GLUCOSE, CAPILLARY
Glucose-Capillary: 195 mg/dL — ABNORMAL HIGH (ref 70–99)
Glucose-Capillary: 133 mg/dL — ABNORMAL HIGH (ref 70–99)
Glucose-Capillary: 110 mg/dL — ABNORMAL HIGH (ref 70–99)
Glucose-Capillary: 127 mg/dL — ABNORMAL HIGH (ref 70–99)

## 2019-05-30 MED ORDER — ENALAPRILAT 1.25 MG/ML IV SOLN
1.2500 mg | Freq: Four times a day (QID) | INTRAVENOUS | Status: DC | PRN
  Filled 2019-05-30: qty 1

## 2019-05-30 NOTE — Progress Notes (Signed)
Bladder scan for over 800 cc.  Straight cath for 650 dark yellow cloudy urine. Dorna Bloom RN

## 2019-05-30 NOTE — Progress Notes (Signed)
[place] at [place] NAME: Adrian Valdez    MR#:  [id number]  DATE OF BIRTH:  07-Sep-1950  SUBJECTIVE:  Patient came in from [place] facility with witnessed generalized tonic clonic seizure which appears to be new. He has history of dementia and has been intermittently agitated and restless.  Gets agitated when not sedated per nursing.   Every time I have seen him he is sedated REVIEW OF SYSTEMS:   Review of Systems  Unable to perform ROS: Mental status change   Tolerating Diet: Tolerating PT:   DRUG ALLERGIES:   Allergies  Allergen Reactions  . Oxybutynin Other (See Comments)    Headache  . Myrbetriq [Mirabegron] Other (See Comments)    Other reaction(s): Other (See Comments) migraine Hallucinations      VITALS:  Blood pressure 136/90, pulse 87, temperature 98.6 F (37 C), temperature source Oral, resp. rate 18, height 5\' 10"  (1.778 m), weight 95.7 kg, SpO2 97 %.  PHYSICAL EXAMINATION:   Physical Exam limited exam patient is sedated GENERAL:  68 y.o.-year-old patient lying in the bed with no acute distress. Appears chronically ill EYES: Pupils equal, round, reactive to light and accommodation. No scleral icterus. Extraocular muscles intact.  HEENT: Head atraumatic, normocephalic. Oropharynx and nasopharynx clear.  NECK:  Supple, no jugular venous distention. No thyroid enlargement, no tenderness.  LUNGS: Normal breath sounds bilaterally, no wheezing, rales, rhonchi. No use of accessory muscles of respiration.  CARDIOVASCULAR: S1, S2 normal. No murmurs, rubs, or gallops.  ABDOMEN: Soft, nontender, nondistended. Bowel sounds present. No organomegaly or mass.  EXTREMITIES: No cyanosis, clubbing or edema b/l.    NEUROLOGIC: patient sedated PSYCHIATRIC:  patient is lethargic from sedation SKIN: No obvious rash, lesion, or ulcer.   LABORATORY PANEL:  CBC Recent Labs  Lab 05/30/19 0546  WBC 13.1*  HGB 16.0  HCT 46.4  PLT 259     Chemistries  Recent Labs  Lab 05/27/19 0228 05/30/19 0546  NA 139 136  K 3.0* 4.0  CL 101 99  CO2 26 22  GLUCOSE 102* 179*  BUN 18 17  CREATININE 0.94 1.89*  CALCIUM 8.9 9.0  MG 2.1  --   AST 21  --   ALT 15  --   ALKPHOS 46  --   BILITOT 0.7  --    Cardiac Enzymes No results for input(s): TROPONINI in the last 168 hours. RADIOLOGY:  No results found. ASSESSMENT AND PLAN:  Adrian Braatz. is a 68 y.o. male with a history of advanced dementia, diabetes, hypertension, Waldenstrom macroglobulinemia, anemia admitted after unwitnessed mechanical fall.  Patient was found on the floor next to his bed.  Unclear how long patient was on the floor. Per EMS staff at the nursing facility witness generalized tonic clonic seizures. He transitively became hypoxic in the 30s for few seconds and was bad with immediate resolution of hypoxia  1. Generalized tonic clonic seizure new onset, unclear etiology -CT had negative for stroke. Posterior parietal scalp hematoma present on the right -MRI brain right posterior parietal scalp hematoma -Neurology following.  Continue IV Keppra 500 mg BID -EEG did not show any new seizure -PRN Ativan -Seizure precaution  2. Relative hypoglycemia inpatient with history of diabetes -Blood sugar better controlled at this time  3.  Agitation - very agitated when not sedated per nursing. I have asked nurse to hold off Ativan and sitter-free for 24 hours to place him back to his facility -Psych  input appreciated -Continue Risperdione 0.25mg  PO  q6hrs PRN Agitation  4. Unwitnessed fall with right posterior parietal scalp hematoma -avoid antiplatelet agent. DC Lovenox  5. Hypokalemia replete potassium  6. History of dementia, chronic will resume chronic dementia meds once patient is more awake and able to take orally.  Palliative care  Megan had discussion with patient's son who would like aggressive care and full code for now  According to social  worker patient will return back to [facility].    CODE STATUS: full  DVT Prophylaxis: SCD  TOTAL TIME TAKING CARE OF THIS PATIENT: 30 minutes.  >50% time spent on counselling and coordination of care  POSSIBLE D/C IN  1-2 DAYS, DEPENDING ON CLINICAL CONDITION.  Note: This dictation was prepared with Dragon dictation along with smaller phrase technology. Any transcriptional errors that result from this process are unintentional.  Max Sane M.D on 05/30/2019 at 11:34 AM  Between 7am to 6pm - Pager - [phone]  After 6pm go to [email or web address] - password [email or web address] [facility]  Office  [phone]  CC: Primary care physician; Adrian Valdez, MDPatient ID: Adrian Gibbon., male   DOB: 09/19/1950, 68 y.o.   MRN: [id number]

## 2019-05-30 NOTE — Progress Notes (Addendum)
Pt restless, eyes closed, staying in bed.  Sister in law  Juliann Pulse) called me for an update and she states that they would like hospice set up at [facility] for pt.  Will reconsult CSW/ CM so they are aware. Dorna Bloom RN

## 2019-05-30 NOTE — Care Management Important Message (Signed)
Important Message  Patient Details  Name: Adrian Valdez. MRN: [id number] Date of Birth: [birth year]   Medicare Important Message Given:  Yes     [name] Robi Mitter 05/30/2019, 10:59 AM

## 2019-05-30 NOTE — Progress Notes (Signed)
Pt removed condom cath and trying to get out of bed.  Attempted to reorient, redirect but pt does not follow commands.  Made room cooler and applied warm blanket and turned on music and turned down lights but pt still trying to get out of bed. Pt kicking at this RN when trying to help him to put his legs in bed. Bed moved to the other side of the room and mat placed on the floor.  Equipment and anything the pt could reach is placed out of reach. Dorna Bloom RN

## 2019-05-30 NOTE — Progress Notes (Signed)
Patient is lethargic with non productive cough and congestion. S/p urinary retention overnight. IVF paused at this time. WBC elevated and patient is unable to tolerate PO meds at this time. MD made aware.

## 2019-05-30 NOTE — Progress Notes (Signed)
Patient awake and agitated. Redirection unsuccessful and patient remains very restless. Unable to follow commands at this time. Frequent reminders needed to remain in bed for safety. Mats, bed alarm, and low bed in place at this time.

## 2019-05-30 NOTE — Progress Notes (Signed)
Patient bladder scanned due to 0 urine output for greater than 8hrs. Bladder scan showed 892+, NP Elizabeth instructed to in and out cath. Removed 933mL. Will continue to monitor.

## 2019-05-30 NOTE — Progress Notes (Signed)
Unable to leave pt alone as he is sitting up and throwing his legs over the rail and kicking the railings.  Spoke with AC.  Pt moved to recliner and placed in nurses station for safety. Dorna Bloom RN

## 2019-05-30 NOTE — Progress Notes (Signed)
Pt restless, setting off bed alarm every 10 minutes. Pt sitting up in bed and throwing legs over side rail. Gave pt tylenol crushed in apple juice and given by syringe.  Then gave pt 1 carton of ice cream and 1/2 carton on Ensure by syringe. Dorna Bloom RN

## 2019-05-31 LAB — CBC
HCT: 42.2 % (ref 39.0–52.0)
Hemoglobin: 14 g/dL (ref 13.0–17.0)
MCH: 30 pg (ref 26.0–34.0)
MCHC: 33.2 g/dL (ref 30.0–36.0)
MCV: 90.6 fL (ref 80.0–100.0)
Platelets: 204 10*3/uL (ref 150–400)
RBC: 4.66 MIL/uL (ref 4.22–5.81)
RDW: 12.9 % (ref 11.5–15.5)
WBC: 7.7 10*3/uL (ref 4.0–10.5)
nRBC: 0 % (ref 0.0–0.2)

## 2019-05-31 LAB — BASIC METABOLIC PANEL
Anion gap: 9 (ref 5–15)
BUN: 28 mg/dL — ABNORMAL HIGH (ref 8–23)
CO2: 27 mmol/L (ref 22–32)
Calcium: 8.9 mg/dL (ref 8.9–10.3)
Chloride: 104 mmol/L (ref 98–111)
Creatinine, Ser: 1.41 mg/dL — ABNORMAL HIGH (ref 0.61–1.24)
GFR calc Af Amer: 59 mL/min — ABNORMAL LOW (ref >60)
GFR calc non Af Amer: 51 mL/min — ABNORMAL LOW (ref >60)
Glucose, Bld: 121 mg/dL — ABNORMAL HIGH (ref 70–99)
Potassium: 3.6 mmol/L (ref 3.5–5.1)
Sodium: 140 mmol/L (ref 135–145)

## 2019-05-31 LAB — GLUCOSE, CAPILLARY
Glucose-Capillary: 106 mg/dL — ABNORMAL HIGH (ref 70–99)
Glucose-Capillary: 141 mg/dL — ABNORMAL HIGH (ref 70–99)
Glucose-Capillary: 196 mg/dL — ABNORMAL HIGH (ref 70–99)
Glucose-Capillary: 132 mg/dL — ABNORMAL HIGH (ref 70–99)

## 2019-05-31 MED ORDER — LEVETIRACETAM 100 MG/ML PO SOLN: 500.0000 mg | Freq: Two times a day (BID) | ORAL | 12 refills | Status: AC

## 2019-05-31 MED ORDER — RISPERIDONE 1 MG/ML PO SOLN
0.2500 mg | Freq: Four times a day (QID) | ORAL | Status: DC | PRN
  Administered 2019-05-31: 0.25 mg via ORAL
  Filled 2019-05-31 (×3): qty 0.25

## 2019-05-31 MED ORDER — RISPERIDONE 0.25 MG PO TABS: 0.2500 mg | ORAL_TABLET | Freq: Four times a day (QID) | ORAL | 0 refills | Status: DC | PRN

## 2019-05-31 NOTE — Plan of Care (Signed)

## 2019-05-31 NOTE — Progress Notes (Signed)
New referral for outpatient Palliative to follow at discharge received from [facility]. Patient is from [facility] but plan is for discharge to short term  rehab. Patient information given to referral. Flo Shanks BSN, RN, [place] [phone]

## 2019-05-31 NOTE — Progress Notes (Signed)
Pt has fallen asleep in the recliner in the nurses station. Dorna Bloom RN

## 2019-05-31 NOTE — NC FL2 (Signed)
[place] LEVEL OF CARE SCREENING TOOL     IDENTIFICATION  Patient Name: Adrian Valdez. Birthdate: Aug 14, 1951 Sex: male Admission Date (Current Location): 05/27/2019  [place] and [place] Number:  Engineering geologist and Address:  [facility], [address], [place], [place] [address]      Provider Number: [id number]  Attending Physician Name and Address:  Max Sane, MD  Relative Name and Phone Number:  Laderius Guide [phone]    Current Level of Care: Hospital Recommended Level of Care: [place] Prior Approval Number:    Date Approved/Denied:   PASRR Number: [id number]  Discharge Plan: SNF    Current Diagnoses: Patient Active Problem List   Diagnosis Date Noted  . Palliative care by specialist   . Fall   . Hematoma of scalp   . Dementia with behavioral disturbance ([place])   . Seizure ([place]) 05/27/2019  . Cellulitis 01/03/2019  . Necrosis of finger ([place]) 01/03/2019  . Cellulitis of hand 10/30/2018  . Goals of care, counseling/discussion 11/20/2016  . Encounter for antineoplastic chemotherapy 10/30/2016  . Altered mental status 09/22/2016  . Waldenstrom's macroglobulinemia ([place]) 09/01/2016  . Enlarged prostate with urinary obstruction 08/16/2016  . Prostate cancer screening 07/19/2016  . Right Non-Complex Renal Cyst 07/19/2016  . Dysuria 11/30/2015  . Urgency of urination 11/30/2015  . Nocturia 11/30/2015  . 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 B12 deficiency 07/27/2015  . 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 Confusion state 07/07/2015  . BP (high blood pressure) 07/07/2015  . Major depressive disorder, single episode, moderate ([place]) 07/07/2015  . FOM (frequency of micturition) 07/07/2015    Orientation RESPIRATION BLADDER Height & Weight     Self  Normal Incontinent Weight: 95.7 kg Height:  5\' 10"  (177.8 cm)  BEHAVIORAL SYMPTOMS/MOOD NEUROLOGICAL BOWEL  NUTRITION STATUS      Continent Diet  AMBULATORY STATUS COMMUNICATION OF NEEDS Skin   Extensive Assist Verbally Bruising, Skin abrasions                       Personal Care Assistance Level of Assistance  Bathing, Feeding, Dressing, Total care Bathing Assistance: Maximum assistance Feeding assistance: Limited assistance Dressing Assistance: Limited assistance Total Care Assistance: Maximum assistance   Functional Limitations Info  Sight, Speech, Hearing Sight Info: Adequate Hearing Info: Adequate Speech Info: Adequate    SPECIAL CARE FACTORS FREQUENCY  PT (By licensed PT), OT (By licensed OT)     PT Frequency: 5x per week OT Frequency: 5x per week            Contractures Contractures Info: Not present    Additional Factors Info  Code Status, Allergies Code Status Info: DNR Allergies Info: oxybutynin, myrbetriq           Current Medications (05/31/2019):  This is the current hospital active medication list Current Facility-Administered Medications  Medication Dose Route Frequency Provider Last Rate Last Dose  . acetaminophen (TYLENOL) tablet 650 mg  650 mg Oral Q6H PRN Harrie Foreman, MD   650 mg at 05/30/19 2157   Or  . acetaminophen (TYLENOL) suppository 650 mg  650 mg Rectal Q6H PRN Harrie Foreman, MD      . docusate sodium (COLACE) capsule 100 mg  100 mg Oral BID Harrie Foreman, MD   100 mg at 05/30/19 2107  . donepezil (ARICEPT) tablet 10 mg  10 mg Oral QHS Harrie Foreman, MD  10 mg at 05/30/19 2106  . hydrALAZINE (APRESOLINE) injection 10 mg  10 mg Intravenous Q6H PRN Max Sane, MD      . hydrALAZINE (APRESOLINE) tablet 10 mg  10 mg Oral Q6H Max Sane, MD   10 mg at 05/31/19 0612  . insulin aspart (novoLOG) injection 0-5 Units  0-5 Units Subcutaneous QHS Harrie Foreman, MD      . insulin aspart (novoLOG) injection 0-9 Units  0-9 Units Subcutaneous TID WC Harrie Foreman, MD   1 Units at 05/31/19 1259  . latanoprost (XALATAN)  0.005 % ophthalmic solution 1 drop  1 drop Both Eyes QHS Harrie Foreman, MD   1 drop at 05/30/19 2109  . levETIRAcetam (KEPPRA) 100 MG/ML solution 500 mg  500 mg Oral BID Max Sane, MD   500 mg at 05/31/19 0907  . memantine (NAMENDA) tablet 10 mg  10 mg Oral BID Harrie Foreman, MD   10 mg at 05/31/19 [id number]  . ondansetron (ZOFRAN) tablet 4 mg  4 mg Oral Q6H PRN Harrie Foreman, MD       Or  . ondansetron [facility]) injection 4 mg  4 mg Intravenous Q6H PRN Harrie Foreman, MD      . risperiDONE (RISPERDAL) tablet 0.25 mg  0.25 mg Oral Q6H PRN Cristofano, Dorene Ar, MD      . tamsulosin (FLOMAX) capsule 0.4 mg  0.4 mg Oral Daily Harrie Foreman, MD   0.4 mg at 05/31/19 0908  . timolol (TIMOPTIC) 0.5 % ophthalmic solution 1 drop  1 drop Both Eyes BID Harrie Foreman, MD   1 drop at 05/30/19 2109  . [START ON 06/13/2019] vitamin B-12 (CYANOCOBALAMIN) tablet 1,000 mcg  1,000 mcg Oral Daily Harrie Foreman, MD         Discharge Medications: Please see discharge summary for a list of discharge medications.  Relevant Imaging Results:  Relevant Lab Results:   Additional Information ss # [id number]. Pt from ALF. Needs SNF prior to return to facility  Latanya Maudlin, RN

## 2019-05-31 NOTE — Progress Notes (Signed)
Unable to keep pt seated in recliner.  Pt attempted to stand but unable to stand on his own.  Moved pt back to room and into bed with max assist of 2.  Pt in low bed, mats placed on both sides of bed, bed alarm active. Dorna Bloom RN

## 2019-05-31 NOTE — Progress Notes (Signed)
[place] at [place] NAME: Adrian Valdez    MR#:  [id number]  DATE OF BIRTH:  02/20/1951  SUBJECTIVE:  Patient came in from [place] facility with witnessed generalized tonic clonic seizure which appears to be new. He has history of dementia and has been intermittently agitated and restless.  Gets agitated when not sedated per nursing.   Every time I have seen him he is sedated. Nursing is worried about safety but without benzo's and sitter - level of care he needs - very difficult disposition REVIEW OF SYSTEMS:   Review of Systems  Unable to perform ROS: Mental status change   Tolerating Diet: Tolerating PT:   DRUG ALLERGIES:   Allergies  Allergen Reactions  . Oxybutynin Other (See Comments)    Headache  . Myrbetriq [Mirabegron] Other (See Comments)    Other reaction(s): Other (See Comments) migraine Hallucinations      VITALS:  Blood pressure 115/75, pulse 80, temperature 98 F (36.7 C), temperature source Oral, resp. rate 20, height 5\' 10"  (1.778 m), weight 95.7 kg, SpO2 98 %.  PHYSICAL EXAMINATION:   Physical Exam limited exam patient is sedated GENERAL:  68 y.o.-year-old patient lying in the bed with no acute distress. Appears chronically ill EYES: Pupils equal, round, reactive to light and accommodation. No scleral icterus. Extraocular muscles intact.  HEENT: Head atraumatic, normocephalic. Oropharynx and nasopharynx clear.  NECK:  Supple, no jugular venous distention. No thyroid enlargement, no tenderness.  LUNGS: Normal breath sounds bilaterally, no wheezing, rales, rhonchi. No use of accessory muscles of respiration.  CARDIOVASCULAR: S1, S2 normal. No murmurs, rubs, or gallops.  ABDOMEN: Soft, nontender, nondistended. Bowel sounds present. No organomegaly or mass.  EXTREMITIES: No cyanosis, clubbing or edema b/l.    NEUROLOGIC: patient sedated PSYCHIATRIC:  patient is lethargic from sedation SKIN: No obvious rash,  lesion, or ulcer.   LABORATORY PANEL:  CBC Recent Labs  Lab 05/31/19 0720  WBC 7.7  HGB 14.0  HCT 42.2  PLT 204    Chemistries  Recent Labs  Lab 05/27/19 0228  05/31/19 0720  NA 139   < > 140  K 3.0*   < > 3.6  CL 101   < > 104  CO2 26   < > 27  GLUCOSE 102*   < > 121*  BUN 18   < > 28*  CREATININE 0.94   < > 1.41*  CALCIUM 8.9   < > 8.9  MG 2.1  --   --   AST 21  --   --   ALT 15  --   --   ALKPHOS 46  --   --   BILITOT 0.7  --   --    < > = values in this interval not displayed.   Cardiac Enzymes No results for input(s): TROPONINI in the last 168 hours. RADIOLOGY:  No results found. ASSESSMENT AND PLAN:  Quishawn Fuselier. is a 68 y.o. male with a history of advanced dementia, diabetes, hypertension, Waldenstrom macroglobulinemia, anemia admitted after unwitnessed mechanical fall.  Patient was found on the floor next to his bed.  Unclear how long patient was on the floor. Per EMS staff at the nursing facility witness generalized tonic clonic seizures. He transitively became hypoxic in the 30s for few seconds and was bad with immediate resolution of hypoxia  1. Generalized tonic clonic seizure new onset, unclear etiology -CT had negative for stroke. Posterior parietal scalp hematoma present  on the right -MRI brain right posterior parietal scalp hematoma -Neurology following.  Continue IV Keppra 500 mg BID -EEG did not show any new seizure -PRN Ativan -Seizure precaution  2. Relative hypoglycemia inpatient with history of diabetes -Blood sugar better controlled at this time  3.  Agitation - very agitated when not sedated per nursing. I have asked nurse to hold off Ativan and sitter-free for 24 hours to place him back to his facility - I had ask facility rep to assess him but Unfortunately Facility declined taking him back with level of care he needs now. They feel he needs rehab. I can't imagine this person able to do rehab when he can barely even wake up and when he  does - so agitated. They've asked PT eval which we'll request -Psych input appreciated -Continue Risperdione 0.25mg  PO  q6hrs PRN Agitation  4. Unwitnessed fall with right posterior parietal scalp hematoma -avoid antiplatelet agent. DC Lovenox  5. Hypokalemia replete potassium  6. History of dementia, chronic will resume chronic dementia meds once patient is more awake and able to take orally.  Palliative care  Megan had discussion with patient's son who would like aggressive care and full code for now  [facility] declined to take him back. This will be very difficult disposition. I don't think this individual is rehab appropriate. Will get PT eval for placement   CODE STATUS: DNR (based on my d/w patient's HCPOA on 9/24)  DVT Prophylaxis: SCD  TOTAL TIME TAKING CARE OF THIS PATIENT: 30 minutes.  >50% time spent on counselling and coordination of care  POSSIBLE D/C IN  1-2 DAYS, DEPENDING ON CLINICAL CONDITION.  Note: This dictation was prepared with Dragon dictation along with smaller phrase technology. Any transcriptional errors that result from this process are unintentional.  Max Sane M.D on 05/31/2019 at 2:26 PM  Between 7am to 6pm - Pager - [phone]  After 6pm go to [email or web address] - password [email or web address] [place] [facility]  Office  [phone]  CC: Primary care physician; Lawson Radar, NPPatient ID: Adrian Valdez., male   DOB: August 28, 1951, 68 y.o.   MRN: [id number]

## 2019-05-31 NOTE — Consult Note (Addendum)
[facility] Consult   Reason for Consult: Follow-up consult for agitation Referring Physician: Manuella Ghazi Patient Identification: Adrian Valdez. MRN:  [id number] Principal Diagnosis: <principal problem not specified> Diagnosis:  Active Problems:   Goals of care, counseling/discussion   Seizure [facility])   Palliative care by specialist   Fall   Hematoma of scalp   Dementia with behavioral disturbance ([place])   Total Time spent with patient: 20 minutes  Subjective:   Adrian E Jahel Leleux. is a 68 y.o. male patient admitted post seizure/fall.  Currently displaying agitation  HPI: Patient was seen at bedside, nursing tech standing by the patient.  Per nursing report patient has been agitated since coming off of Ativan.  Patient is minimally responsive to yes and no questions only. Interview limited by patient's mental state.  Patient does not appear stiff or rigid. Some degree of agitation noted during visit.  Past Medical History:  Past Medical History:  Diagnosis Date  . 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 B12 deficiency 07/27/2015  . Dementia ([place])   . Diabetes mellitus, type 2 ([place])   . Enlarged prostate   . Glaucoma   . Glaucoma   . Hypertension   . Neuropathy    feet and legs  . Urinary frequency   . Vitamin B 12 deficiency   . Waldenstrom macroglobulinemia ([place])   . Waldenstrom's macroglobulinemia [facility])     Past Surgical History:  Procedure Laterality Date  . CATARACT EXTRACTION W/ INTRAOCULAR LENS IMPLANT    . COLONOSCOPY    . I&D EXTREMITY Bilateral 01/03/2019   Procedure: IRRIGATION AND DEBRIDEMENT right index finger, and left middle finger;  Surgeon: Leanora Cover, MD;  Location: [facility];  Service: Orthopedics;  Laterality: Bilateral;  . KNEE ARTHROSCOPY Right   . LASIK    . PHOTOCOAGULATION WITH LASER Right 05/24/2017   Procedure: PHOTOCOAGULATION WITH LASER;  Surgeon: Leandrew Koyanagi, MD;  Location: [facility];  Service: Ophthalmology;   Laterality: Right;  Diabetic - oral meds   Family History:  Family History  Problem Relation Age of Onset  . Hypertension Mother   . Hypertension Father   . CAD Father   . Breast cancer Maternal Grandmother   . Ovarian cancer Paternal Grandmother   . Kidney disease Neg Hx   . Prostate cancer Neg Hx    Family Psychiatric  History: Nonsignificant Social History:  Social History   Substance and Sexual Activity  Alcohol Use Yes   Comment: social drinker wine couple of times a year     Social History   Substance and Sexual Activity  Drug Use No    Social History   Socioeconomic History  . Marital status: Single    Spouse name: Not on file  . Number of children: Not on file  . Years of education: Not on file  . Highest education level: Not on file  Occupational History  . Not on file  Social Needs  . Financial resource strain: Not on file  . Food insecurity    Worry: Not on file    Inability: Not on file  . Transportation needs    Medical: Not on file    Non-medical: Not on file  Tobacco Use  . Smoking status: Never Smoker  . Smokeless tobacco: Never Used  Substance and Sexual Activity  . Alcohol use: Yes    Comment: social drinker wine couple of times a year  . Drug use: No  . Sexual activity: Not on file  Lifestyle  . Physical activity    Days per week: Not on file    Minutes per session: Not on file  . Stress: Not on file  Relationships  . Social Herbalist on phone: Not on file    Gets together: Not on file    Attends religious service: Not on file    Active member of club or organization: Not on file    Attends meetings of clubs or organizations: Not on file    Relationship status: Not on file  Other Topics Concern  . Not on file  Social History Narrative   From [place], [place]   Additional Social History:    Allergies:   Allergies  Allergen Reactions  . Oxybutynin Other (See Comments)    Headache  . Myrbetriq [Mirabegron]  Other (See Comments)    Other reaction(s): Other (See Comments) migraine Hallucinations      Labs:  Results for orders placed or performed during the hospital encounter of 05/27/19 (from the past 48 hour(s))  Glucose, capillary     Status: Abnormal   Collection Time: 05/29/19  5:05 PM  Result Value Ref Range   Glucose-Capillary 162 (H) 70 - 99 mg/dL  Glucose, capillary     Status: Abnormal   Collection Time: 05/29/19  8:45 PM  Result Value Ref Range   Glucose-Capillary 125 (H) 70 - 99 mg/dL  CBC     Status: Abnormal   Collection Time: 05/30/19  5:46 AM  Result Value Ref Range   WBC 13.1 (H) 4.0 - 10.5 K/uL   RBC 5.33 4.22 - 5.81 MIL/uL   Hemoglobin 16.0 13.0 - 17.0 g/dL   HCT 46.4 39.0 - 52.0 %   MCV 87.1 80.0 - 100.0 fL   MCH 30.0 26.0 - 34.0 pg   MCHC 34.5 30.0 - 36.0 g/dL   RDW 12.9 11.5 - 15.5 %   Platelets 259 150 - 400 K/uL   nRBC 0.0 0.0 - 0.2 %    Comment: Performed at [facility], [address] [place], [place] [id number]  Basic metabolic panel     Status: Abnormal   Collection Time: 05/30/19  5:46 AM  Result Value Ref Range   Sodium 136 135 - 145 mmol/L   Potassium 4.0 3.5 - 5.1 mmol/L   Chloride 99 98 - 111 mmol/L   CO2 22 22 - 32 mmol/L   Glucose, Bld 179 (H) 70 - 99 mg/dL   BUN 17 8 - 23 mg/dL   Creatinine, Ser 1.89 (H) 0.61 - 1.24 mg/dL   Calcium 9.0 8.9 - 10.3 mg/dL   GFR calc non Af Amer 36 (L) >60 mL/min   GFR calc Af Amer 42 (L) >60 mL/min   Anion gap 15 5 - 15    Comment: Performed at [facility], [address] [place], [place] [address]  Glucose, capillary     Status: Abnormal   Collection Time: 05/30/19  7:33 AM  Result Value Ref Range   Glucose-Capillary 195 (H) 70 - 99 mg/dL  Glucose, capillary     Status: Abnormal   Collection Time: 05/30/19 11:40 AM  Result Value Ref Range   Glucose-Capillary 133 (H) 70 - 99 mg/dL  Glucose, capillary     Status: Abnormal   Collection Time: 05/30/19  5:00 PM  Result Value Ref  Range   Glucose-Capillary 110 (H) 70 - 99 mg/dL  Glucose, capillary     Status: Abnormal  Collection Time: 05/30/19  9:28 PM  Result Value Ref Range   Glucose-Capillary 127 (H) 70 - 99 mg/dL  CBC     Status: None   Collection Time: 05/31/19  7:20 AM  Result Value Ref Range   WBC 7.7 4.0 - 10.5 K/uL   RBC 4.66 4.22 - 5.81 MIL/uL   Hemoglobin 14.0 13.0 - 17.0 g/dL   HCT 42.2 39.0 - 52.0 %   MCV 90.6 80.0 - 100.0 fL   MCH 30.0 26.0 - 34.0 pg   MCHC 33.2 30.0 - 36.0 g/dL   RDW 12.9 11.5 - 15.5 %   Platelets 204 150 - 400 K/uL   nRBC 0.0 0.0 - 0.2 %    Comment: Performed at [facility], [address] [place], [place] [id number]  Basic metabolic panel     Status: Abnormal   Collection Time: 05/31/19  7:20 AM  Result Value Ref Range   Sodium 140 135 - 145 mmol/L   Potassium 3.6 3.5 - 5.1 mmol/L   Chloride 104 98 - 111 mmol/L   CO2 27 22 - 32 mmol/L   Glucose, Bld 121 (H) 70 - 99 mg/dL   BUN 28 (H) 8 - 23 mg/dL   Creatinine, Ser 1.41 (H) 0.61 - 1.24 mg/dL   Calcium 8.9 8.9 - 10.3 mg/dL   GFR calc non Af Amer 51 (L) >60 mL/min   GFR calc Af Amer 59 (L) >60 mL/min   Anion gap 9 5 - 15    Comment: Performed at [facility], [address] [place],  [address]  Glucose, capillary     Status: Abnormal   Collection Time: 05/31/19  8:11 AM  Result Value Ref Range   Glucose-Capillary 106 (H) 70 - 99 mg/dL  Glucose, capillary     Status: Abnormal   Collection Time: 05/31/19 11:52 AM  Result Value Ref Range   Glucose-Capillary 141 (H) 70 - 99 mg/dL    Current Facility-Administered Medications  Medication Dose Route Frequency Provider Last Rate Last Dose  . acetaminophen (TYLENOL) tablet 650 mg  650 mg Oral Q6H PRN Harrie Foreman, MD   650 mg at 05/30/19 2157   Or  . acetaminophen (TYLENOL) suppository 650 mg  650 mg Rectal Q6H PRN Harrie Foreman, MD      . docusate sodium (COLACE) capsule 100 mg  100 mg Oral BID Harrie Foreman, MD   100 mg at  05/30/19 2107  . donepezil (ARICEPT) tablet 10 mg  10 mg Oral QHS Harrie Foreman, MD   10 mg at 05/30/19 2106  . hydrALAZINE (APRESOLINE) injection 10 mg  10 mg Intravenous Q6H PRN Max Sane, MD      . hydrALAZINE (APRESOLINE) tablet 10 mg  10 mg Oral Q6H Max Sane, MD   10 mg at 05/31/19 0612  . insulin aspart (novoLOG) injection 0-5 Units  0-5 Units Subcutaneous QHS Harrie Foreman, MD      . insulin aspart (novoLOG) injection 0-9 Units  0-9 Units Subcutaneous TID WC Harrie Foreman, MD   1 Units at 05/31/19 1259  . latanoprost (XALATAN) 0.005 % ophthalmic solution 1 drop  1 drop Both Eyes QHS Harrie Foreman, MD   1 drop at 05/30/19 2109  . levETIRAcetam (KEPPRA) 100 MG/ML solution 500 mg  500 mg Oral BID Max Sane, MD   500 mg at 05/31/19 0907  . memantine (NAMENDA) tablet 10 mg  10 mg Oral BID Rosilyn Mings  S, MD   10 mg at 05/31/19 0907  . ondansetron (ZOFRAN) tablet 4 mg  4 mg Oral Q6H PRN Harrie Foreman, MD       Or  . ondansetron [facility]) injection 4 mg  4 mg Intravenous Q6H PRN Harrie Foreman, MD      . risperiDONE (RISPERDAL) tablet 0.25 mg  0.25 mg Oral Q6H PRN Seriyah Collison, Dorene Ar, MD      . tamsulosin (FLOMAX) capsule 0.4 mg  0.4 mg Oral Daily Harrie Foreman, MD   0.4 mg at 05/31/19 0908  . timolol (TIMOPTIC) 0.5 % ophthalmic solution 1 drop  1 drop Both Eyes BID Harrie Foreman, MD   1 drop at 05/30/19 2109  . [START ON 06/13/2019] vitamin B-12 (CYANOCOBALAMIN) tablet 1,000 mcg  1,000 mcg Oral Daily Harrie Foreman, MD        Musculoskeletal: Strength & Muscle Tone: within normal limits Gait & Station: unable to stand Patient leans: N/A  Psychiatric Specialty Exam:     Blood pressure 115/75, pulse 80, temperature 98 F (36.7 C), temperature source Oral, resp. rate 20, height 5\' 10"  (1.778 m), weight 95.7 kg, SpO2 98 %.Body mass index is 30.28 kg/m.   General Appearance:laying  In bed, eyes closed  Eye Contact:  Absent  Speech:  NA   Volume:  NA  Mood:  NA  Affect:  Restricted  Thought Process:  NA  Orientation:  NA  Thought Content:  NA  Suicidal Thoughts:  NA  Homicidal Thoughts:  NA  Memory: NA  Judgement:  NA  Insight:  NA  Psychomotor Activity:  Decreased      Treatment Plan Summary: Daily contact with patient to assess and evaluate symptoms and progress in treatment, Medication management and Plan :  DX: Dementia with Behavioral disturbance  Agitation: Risperidone to 0.25mg  q6hrs PRN agtation. Patient unable to take tablet medication will switch to liquid. Hold Sertraline Hold Seroquel  Disposition: Psychiatry will continue to follow til discharge.  Dixie Dials, MD 05/31/2019 2:58 PM

## 2019-05-31 NOTE — Progress Notes (Signed)
Pt has awaken and is restless in recliner at nurses station. Dorna Bloom RN

## 2019-06-01 DIAGNOSIS — F0391 Unspecified dementia with behavioral disturbance: Secondary | ICD-10-CM

## 2019-06-01 LAB — GLUCOSE, CAPILLARY
Glucose-Capillary: 92 mg/dL (ref 70–99)
Glucose-Capillary: 171 mg/dL — ABNORMAL HIGH (ref 70–99)
Glucose-Capillary: 103 mg/dL — ABNORMAL HIGH (ref 70–99)
Glucose-Capillary: 149 mg/dL — ABNORMAL HIGH (ref 70–99)

## 2019-06-01 MED ORDER — QUETIAPINE FUMARATE 25 MG PO TABS
25.0000 mg | ORAL_TABLET | Freq: Every day | ORAL | Status: DC
  Filled 2019-06-01: qty 1

## 2019-06-01 MED ORDER — QUETIAPINE FUMARATE 25 MG PO TABS
25.0000 mg | ORAL_TABLET | Freq: Two times a day (BID) | ORAL | Status: DC | PRN
  Administered 2019-06-01 – 2019-06-02 (×3): 25 mg via ORAL
  Filled 2019-06-01 (×2): qty 1

## 2019-06-01 MED ORDER — INFLUENZA VAC A&B SA ADJ QUAD 0.5 ML IM PRSY
0.5000 mL | PREFILLED_SYRINGE | INTRAMUSCULAR | Status: AC
  Administered 2019-06-02: 08:00:00 0.5 mL via INTRAMUSCULAR
  Filled 2019-06-01: qty 0.5

## 2019-06-01 MED ORDER — HALOPERIDOL LACTATE 5 MG/ML IJ SOLN
1.0000 mg | Freq: Once | INTRAMUSCULAR | Status: AC
  Administered 2019-06-01: 21:00:00 1 mg via INTRAMUSCULAR
  Filled 2019-06-01: qty 1

## 2019-06-01 MED ORDER — PNEUMOCOCCAL VAC POLYVALENT 25 MCG/0.5ML IJ INJ
0.5000 mL | INJECTION | INTRAMUSCULAR | Status: AC
  Administered 2019-06-02: 08:00:00 0.5 mL via INTRAMUSCULAR
  Filled 2019-06-01: qty 0.5

## 2019-06-01 MED ORDER — HYDRALAZINE HCL 50 MG PO TABS
25.0000 mg | ORAL_TABLET | Freq: Two times a day (BID) | ORAL | Status: DC
  Administered 2019-06-01 – 2019-06-02 (×2): 25 mg via ORAL
  Filled 2019-06-01: qty 1

## 2019-06-01 NOTE — Progress Notes (Addendum)
[facility] NAME: Adrian Valdez    MR#:  [id number]  DATE OF BIRTH:  05/15/1951  SUBJECTIVE:  Patient came in from [facility] facility with witnessed generalized tonic clonic seizure which appears to be new. He has history of dementia and has been intermittently agitated and restless.  Gets agitated when not sedated per nursing.   Every time I have seen him he is sedated. Nursing is worried about safety but without benzo's and sitter - level of care he needs - very difficult disposition  Sitter in the room REVIEW OF SYSTEMS:   Review of Systems  Unable to perform ROS: Mental status change   DRUG ALLERGIES:   Allergies  Allergen Reactions  . Oxybutynin Other (See Comments)    Headache  . Myrbetriq [Mirabegron] Other (See Comments)    Other reaction(s): Other (See Comments) migraine Hallucinations      VITALS:  Blood pressure (!) 145/81, pulse 84, temperature 98 F (36.7 C), temperature source Oral, resp. rate 19, height 5\' 10"  (1.778 m), weight 95.7 kg, SpO2 99 %.  PHYSICAL EXAMINATION:   Physical Exam limited exam patient is sedated GENERAL:  68 y.o.-year-old patient lying in the bed with no acute distress. Appears chronically ill EYES: Pupils equal, round, reactive to light and accommodation. No scleral icterus. Extraocular muscles intact.  HEENT: Head atraumatic, normocephalic. Oropharynx and nasopharynx clear.  NECK:  Supple, no jugular venous distention. No thyroid enlargement, no tenderness.  LUNGS: Normal breath sounds bilaterally, no wheezing, rales, rhonchi. No use of accessory muscles of respiration.  CARDIOVASCULAR: S1, S2 normal. No murmurs, rubs, or gallops.  ABDOMEN: Soft, nontender, nondistended. Bowel sounds present. No organomegaly or mass.  EXTREMITIES: No cyanosis, clubbing or edema b/l.    NEUROLOGIC: patient sedated PSYCHIATRIC:  patient is lethargic from sedation SKIN: No obvious rash, lesion, or ulcer.    LABORATORY PANEL:  CBC Recent Labs  Lab 05/31/19 0720  WBC 7.7  HGB 14.0  HCT 42.2  PLT 204    Chemistries  Recent Labs  Lab 05/27/19 0228  05/31/19 0720  NA 139   < > 140  K 3.0*   < > 3.6  CL 101   < > 104  CO2 26   < > 27  GLUCOSE 102*   < > 121*  BUN 18   < > 28*  CREATININE 0.94   < > 1.41*  CALCIUM 8.9   < > 8.9  MG 2.1  --   --   AST 21  --   --   ALT 15  --   --   ALKPHOS 46  --   --   BILITOT 0.7  --   --    < > = values in this interval not displayed.   Cardiac Enzymes No results for input(s): TROPONINI in the last 168 hours. RADIOLOGY:  No results found. ASSESSMENT AND PLAN:  Adrian Valdez. is a 68 y.o. male with a history of advanced dementia, diabetes, hypertension, Waldenstrom macroglobulinemia, anemia admitted after unwitnessed mechanical fall.  Patient was found on the floor next to his bed.  Unclear how long patient was on the floor. Per EMS staff at the nursing facility witness generalized tonic clonic seizures.  1. Generalized tonic clonic seizure new onset, unclear etiology -CT had negative for stroke. Posterior parietal scalp hematoma present on the right -MRI brain right posterior parietal scalp hematoma -Neurology following.   -Continue  Keppra 500  mg BID -EEG did not show any new seizure -PRN Ativan -Seizure precaution  2. Relative hypoglycemia inpatient with history of diabetes -Blood sugar better controlled at this time  3.  Agitation - very agitated when not sedated per nursing. -Unfortunately pt's  Facility declined taking him back with level of care he needs now. They feel he needs rehab. I can't imagine this person able to do rehab when he can barely even wake up and when he does - so agitated. They've asked PT eval which we'll request -Psych input appreciated  -Continue Risperdione 0.25mg  PO  q6hrs PRN Agitation -overall poor prognosis  4. Unwitnessed fall with right posterior parietal scalp hematoma -avoid antiplatelet  agent. DC Lovenox  5. Hypokalemia replete potassium  6. History of dementia, chronic will resume chronic dementia meds once patient is more awake and able to take orally.  7. Urinary rentention ?etio -in and out cath prn  Palliative care  Megan had discussion with patient's brother Madan Hannel [facility]) who would like aggressive care and full code however Dr Bobbye Riggs has d/w brother [name] and pt is now DNR  [facility] declined to take him back. This will be very difficult disposition. I don't think this patient is rehab appropriate given he requires ativan to keep him calm. Will get PT eval for placement   CODE STATUS: DNR (based on Dr Trena Platt d/w patient's HCPOA on 9/24)  DVT Prophylaxis: SCD  TOTAL TIME TAKING CARE OF THIS PATIENT: 30 minutes.  >50% time spent on counselling and coordination of care  POSSIBLE D/C IN  ? DAYS, DEPENDING ON CLINICAL CONDITION.  Note: This dictation was prepared with Dragon dictation along with smaller phrase technology. Any transcriptional errors that result from this process are unintentional.  Fritzi Mandes M.D on 06/01/2019 at 8:51 AM  Between 7am to 6pm - Pager - [phone]  After 6pm go to [email or web address] - password [email or web address] [facility]  Office  [phone]  CC: Primary care physician; Lawson Radar, NPPatient ID: Adrian Gibbon., male   DOB: 1950-12-19, 68 y.o.   MRN: [id number]

## 2019-06-01 NOTE — Consult Note (Addendum)
[facility] Consult   Reason for Consult:  Agitation  Referring Physician:  Dr Posey Pronto Patient Identification: Adrian Valdez. MRN:  [id number] Principal Diagnosis: Seizures Diagnosis:  Active Problems:   Major depressive disorder, single episode, moderate (HCC)   Seizure (HCC)   Goals of care, counseling/discussion   Palliative care by specialist   Fall   Hematoma of scalp   Dementia with behavioral disturbance ([place])  Total Time spent with patient: 30 minutes  Subjective:   Adrian Valdez. is a 68 y.o. male patient admitted post seizure/fall.  Consult placed for agitation, none on assessment.  Drowsy with minimal verbal interaction, "I'm alright, doing fine."  Person seen and evaluated in person by this provider.  He was drowsy on assessment.  Sitter at the bedside reported he ate a "good" breakfast and is drowsy now but earlier was trying to get out of the bed frequently (2 person assist).  She also stated last night he was restless and getting out of the bed frequently, approximately 2 hours of sleep.  Appears he may be getting his day and night time hours of sleep confused.  He was taking Seroquel TID prior to admission, placed for bedtime and PRN during the day as this may assist his agitation.  HPI per psychiatrist: Patient was seen at bedside, nursing tech standing by the patient.  Per nursing report patient has been agitated since coming off of Ativan.  Patient is minimally responsive to yes and no questions only. Interview limited by patient's mental state.  Patient does not appear stiff or rigid. Some degree of agitation noted during visit.  Past Psychiatric History: dementia, anxiety  Risk to Self:  ye Risk to Others:  none Prior Inpatient Therapy:  none Prior Outpatient Therapy:  none  Past Medical History:  Past Medical History:  Diagnosis Date  . Ascending aorta enlargement ([place]) 08/18/2015  . B12 deficiency 07/27/2015  . Dementia ([place])   . Diabetes  mellitus, type 2 ([place])   . Enlarged prostate   . Glaucoma   . Glaucoma   . Hypertension   . Neuropathy    feet and legs  . Urinary frequency   . Vitamin B 12 deficiency   . Waldenstrom macroglobulinemia ([place])   . Waldenstrom's macroglobulinemia [facility])     Past Surgical History:  Procedure Laterality Date  . CATARACT EXTRACTION W/ INTRAOCULAR LENS IMPLANT    . COLONOSCOPY    . I&D EXTREMITY Bilateral 01/03/2019   Procedure: IRRIGATION AND DEBRIDEMENT right index finger, and left middle finger;  Surgeon: Leanora Cover, MD;  Location: [facility];  Service: Orthopedics;  Laterality: Bilateral;  . KNEE ARTHROSCOPY Right   . LASIK    . PHOTOCOAGULATION WITH LASER Right 05/24/2017   Procedure: PHOTOCOAGULATION WITH LASER;  Surgeon: Leandrew Koyanagi, MD;  Location: [facility];  Service: Ophthalmology;  Laterality: Right;  Diabetic - oral meds   Family History:  Family History  Problem Relation Age of Onset  . Hypertension Mother   . Hypertension Father   . CAD Father   . Breast cancer Maternal Grandmother   . Ovarian cancer Paternal Grandmother   . Kidney disease Neg Hx   . Prostate cancer Neg Hx    Family Psychiatric  History: none Social History:  Social History   Substance and Sexual Activity  Alcohol Use Yes   Comment: social drinker wine couple of times a year     Social History   Substance and Sexual Activity  Drug Use No    Social History   Socioeconomic History  . Marital status: Single    Spouse name: Not on file  . Number of children: Not on file  . Years of education: Not on file  . Highest education level: Not on file  Occupational History  . Not on file  Social Needs  . Financial resource strain: Not on file  . Food insecurity    Worry: Not on file    Inability: Not on file  . Transportation needs    Medical: Not on file    Non-medical: Not on file  Tobacco Use  . Smoking status: Never Smoker  . Smokeless tobacco: Never Used  Substance and  Sexual Activity  . Alcohol use: Yes    Comment: social drinker wine couple of times a year  . Drug use: No  . Sexual activity: Not on file  Lifestyle  . Physical activity    Days per week: Not on file    Minutes per session: Not on file  . Stress: Not on file  Relationships  . Social Herbalist on phone: Not on file    Gets together: Not on file    Attends religious service: Not on file    Active member of club or organization: Not on file    Attends meetings of clubs or organizations: Not on file    Relationship status: Not on file  Other Topics Concern  . Not on file  Social History Narrative   From [place], [place]   Additional Social History:  None    Allergies:   Allergies  Allergen Reactions  . Oxybutynin Other (See Comments)    Headache  . Myrbetriq [Mirabegron] Other (See Comments)    Other reaction(s): Other (See Comments) migraine Hallucinations      Labs:  Results for orders placed or performed during the hospital encounter of 05/27/19 (from the past 48 hour(s))  Glucose, capillary     Status: Abnormal   Collection Time: 05/30/19  5:00 PM  Result Value Ref Range   Glucose-Capillary 110 (H) 70 - 99 mg/dL  Glucose, capillary     Status: Abnormal   Collection Time: 05/30/19  9:28 PM  Result Value Ref Range   Glucose-Capillary 127 (H) 70 - 99 mg/dL  CBC     Status: None   Collection Time: 05/31/19  7:20 AM  Result Value Ref Range   WBC 7.7 4.0 - 10.5 K/uL   RBC 4.66 4.22 - 5.81 MIL/uL   Hemoglobin 14.0 13.0 - 17.0 g/dL   HCT 42.2 39.0 - 52.0 %   MCV 90.6 80.0 - 100.0 fL   MCH 30.0 26.0 - 34.0 pg   MCHC 33.2 30.0 - 36.0 g/dL   RDW 12.9 11.5 - 15.5 %   Platelets 204 150 - 400 K/uL   nRBC 0.0 0.0 - 0.2 %    Comment: Performed at [facility], [address] [place], [place] [id number]  Basic metabolic panel     Status: Abnormal   Collection Time: 05/31/19  7:20 AM  Result Value Ref Range   Sodium 140 135 - 145 mmol/L    Potassium 3.6 3.5 - 5.1 mmol/L   Chloride 104 98 - 111 mmol/L   CO2 27 22 - 32 mmol/L   Glucose, Bld 121 (H) 70 - 99 mg/dL   BUN 28 (H) 8 - 23 mg/dL   Creatinine, Ser 1.41 (H) 0.61 - 1.24 mg/dL  Calcium 8.9 8.9 - 10.3 mg/dL   GFR calc non Af Amer 51 (L) >60 mL/min   GFR calc Af Amer 59 (L) >60 mL/min   Anion gap 9 5 - 15    Comment: Performed at [facility], [address] [place], [place] [address]  Glucose, capillary     Status: Abnormal   Collection Time: 05/31/19  8:11 AM  Result Value Ref Range   Glucose-Capillary 106 (H) 70 - 99 mg/dL  Glucose, capillary     Status: Abnormal   Collection Time: 05/31/19 11:52 AM  Result Value Ref Range   Glucose-Capillary 141 (H) 70 - 99 mg/dL  Glucose, capillary     Status: Abnormal   Collection Time: 05/31/19  5:03 PM  Result Value Ref Range   Glucose-Capillary 196 (H) 70 - 99 mg/dL  Glucose, capillary     Status: Abnormal   Collection Time: 05/31/19 10:22 PM  Result Value Ref Range   Glucose-Capillary 132 (H) 70 - 99 mg/dL  Glucose, capillary     Status: None   Collection Time: 06/01/19  8:06 AM  Result Value Ref Range   Glucose-Capillary 92 70 - 99 mg/dL  Glucose, capillary     Status: Abnormal   Collection Time: 06/01/19 11:50 AM  Result Value Ref Range   Glucose-Capillary 171 (H) 70 - 99 mg/dL    Current Facility-Administered Medications  Medication Dose Route Frequency Provider Last Rate Last Dose  . acetaminophen (TYLENOL) tablet 650 mg  650 mg Oral Q6H PRN Harrie Foreman, MD   650 mg at 05/30/19 2157   Or  . acetaminophen (TYLENOL) suppository 650 mg  650 mg Rectal Q6H PRN Harrie Foreman, MD      . docusate sodium (COLACE) capsule 100 mg  100 mg Oral BID Harrie Foreman, MD   100 mg at 06/01/19 [id number]  . donepezil (ARICEPT) tablet 10 mg  10 mg Oral QHS Harrie Foreman, MD   10 mg at 05/31/19 2125  . hydrALAZINE (APRESOLINE) injection 10 mg  10 mg Intravenous Q6H PRN Max Sane, MD      . hydrALAZINE  (APRESOLINE) tablet 25 mg  25 mg Oral BID Fritzi Mandes, MD      . Derrill Memo ON 06/02/2019] influenza vaccine adjuvanted (FLUAD) injection 0.5 mL  0.5 mL Intramuscular Tomorrow-1000 Fritzi Mandes, MD      . insulin aspart (novoLOG) injection 0-5 Units  0-5 Units Subcutaneous QHS Harrie Foreman, MD      . insulin aspart (novoLOG) injection 0-9 Units  0-9 Units Subcutaneous TID WC Harrie Foreman, MD   2 Units at 06/01/19 1208  . latanoprost (XALATAN) 0.005 % ophthalmic solution 1 drop  1 drop Both Eyes QHS Harrie Foreman, MD   1 drop at 05/31/19 2125  . levETIRAcetam (KEPPRA) 100 MG/ML solution 500 mg  500 mg Oral BID Max Sane, MD   500 mg at 06/01/19 0908  . memantine (NAMENDA) tablet 10 mg  10 mg Oral BID Harrie Foreman, MD   10 mg at 06/01/19 [id number]  . ondansetron (ZOFRAN) tablet 4 mg  4 mg Oral Q6H PRN Harrie Foreman, MD       Or  . ondansetron [facility]) injection 4 mg  4 mg Intravenous Q6H PRN Harrie Foreman, MD      . Derrill Memo ON 06/02/2019] pneumococcal 23 valent vaccine (PNU-IMMUNE) injection 0.5 mL  0.5 mL Intramuscular Tomorrow-1000 Fritzi Mandes, MD      .  risperiDONE (RISPERDAL) 1 MG/ML oral solution 0.25 mg  0.25 mg Oral Q6H PRN Cristofano, Dorene Ar, MD   0.25 mg at 05/31/19 1734  . tamsulosin (FLOMAX) capsule 0.4 mg  0.4 mg Oral Daily Harrie Foreman, MD   0.4 mg at 06/01/19 [id number]  . timolol (TIMOPTIC) 0.5 % ophthalmic solution 1 drop  1 drop Both Eyes BID Harrie Foreman, MD   1 drop at 06/01/19 0914  . [START ON 06/13/2019] vitamin B-12 (CYANOCOBALAMIN) tablet 1,000 mcg  1,000 mcg Oral Daily Harrie Foreman, MD        Musculoskeletal: Strength & Muscle Tone: decreased Gait & Station: did not witness Patient leans: N/A  Psychiatric Specialty Exam: Physical Exam  Nursing note and vitals reviewed. Constitutional: He appears well-developed.  HENT:  Head: Normocephalic.  Neck: Normal range of motion.  Respiratory: Effort normal.  Musculoskeletal: Normal range of  motion.  Psychiatric: His speech is normal and behavior is normal. Thought content normal. His affect is blunt. Cognition and memory are impaired. He expresses impulsivity.    Review of Systems  Psychiatric/Behavioral: Positive for memory loss. The patient is nervous/anxious and has insomnia.   All other systems reviewed and are negative.   Blood pressure (!) 145/91, pulse 85, temperature 97.7 F (36.5 C), temperature source Oral, resp. rate 20, height 5\' 10"  (1.778 m), weight 95.7 kg, SpO2 95 %.Body mass index is 30.28 kg/m.  General Appearance: Casual  Eye Contact:  Minimal  Speech:  minimal  Volume:  Decreased  Mood:  Anxious at times but not on assessment  Affect:  Blunt  Thought Process:  Unable to assess  Orientation:  Other:  person  Thought Content:  unable to assess, drowsy  Suicidal Thoughts:  No  Homicidal Thoughts:  No  Memory:  unable to assess, drowsy  Judgement:  Impaired  Insight:  Lacking  Psychomotor Activity:  Decreased  Concentration:  Concentration: Poor and Attention Span: Poor  Recall:  Poor  Fund of Knowledge:  unable to assess  Language:  Poor  Akathisia:  No  Handed:  Right  AIMS (if indicated):     Assets:  Leisure Time Resilience Social Support  ADL's:  Impaired  Cognition:  Impaired,  Moderate  Sleep:       Treatment Plan Summary: Daily contact with patient to assess and evaluate symptoms and progress in treatment, Medication management and Plan dementia with behavioral disturbances:  -Continue Namenda 10 mg daily -Continue Aricept 10 mg daily  Agitation: -Discontinued Risperdal 0.25 mg every six hours PRN -Restarted Seroquel, Seroquel 25 mg at bedtime and BID PRN (agitation)  Insomnia: -Restarted Seroquel 25 mg at bedtime  Disposition: Supportive therapy provided about ongoing stressors.  Waylan Boga, NP 06/01/2019 1:51 PM   Case discussed and plan agreed upon as outlined above.

## 2019-06-01 NOTE — Evaluation (Signed)
Physical Therapy Evaluation Patient Details Name: Adrian Valdez. MRN: [id number] DOB: 1951-08-23 Today's Date: 06/01/2019   History of Present Illness  Per MD H&P: Pt is a 68 yo male with past medical history of hypertension, diabetes with neuropathy, dementia and Waldenstrm's gammaglobulinemia presents to the emergency department from his living facility after being found on the floor.  The patient was conscious and was alert and oriented.  Initial work-up in the emergency department was unremarkable and the patient was being prepared for discharge when he had a witnessed seizure.  Seizure was generalized tonic-clonic.  It lasted for approximately 30 seconds.  The patient did become hypoxic during the episode and required bag mask ventilation, but he did not lose pulse.  He was given 1 mg of Ativan and seizure activity stopped.  He has not had seizures before.  MD assessment 9/26 includes: Generalized tonic clonic seizure new onset, unclear etiology, Relative hypoglycemia, agitation, Unwitnessed fall with right posterior parietal scalp hematoma, hypokalemia, and h/o dementia.    Clinical Impression  Pt presented with deficits in strength, transfers, mobility, gait, balance, and activity tolerance.  Pt unable to provide PLOF but pt seen by PT services in May of 2020 and was able to transfer with SBA and was able to ambulate 150' with SBA/CGA without an AD.  Pt's standing balance was reported as "good" at that time.  During this PT evaluation the pt presented with a significant decline in functional status compared to his recent baseline including needing +2 Mod A with bed mobility tasks, Mod A with transfers, and requiring Mod A to amb 4 feet with a RW.  Pt required extensive verbal and tactile cues for all commands during the session but ultimately was able to participate.  Pt will benefit from a trial of PT services in a SNF setting upon discharge to safely address above deficits for decreased  caregiver assistance and eventual return to PLOF.      Follow Up Recommendations SNF    Equipment Recommendations  None recommended by PT    Recommendations for Other Services       Precautions / Restrictions Precautions Precautions: Fall;Other (comment) Precaution Comments: Seizure precautions Restrictions Weight Bearing Restrictions: No      Mobility  Bed Mobility Overal bed mobility: Needs Assistance Bed Mobility: Sit to Supine;Supine to Sit     Supine to sit: +2 for physical assistance;Mod assist Sit to supine: +2 for physical assistance;Mod assist   General bed mobility comments: +2 Mod A for BLE and trunk support  Transfers Overall transfer level: Needs assistance Equipment used: Rolling walker (2 wheeled) Transfers: Sit to/from Stand Sit to Stand: +2 safety/equipment;Mod assist;From elevated surface         General transfer comment: Extensive verbal and tactile cues along with mod A to stand from an elevated EOB  Ambulation/Gait Ambulation/Gait assistance: Mod assist Gait Distance (Feet): 4 Feet Assistive device: Rolling walker (2 wheeled) Gait Pattern/deviations: Step-to pattern;Trunk flexed;Decreased step length - right;Decreased step length - left Gait velocity: decreased   General Gait Details: Pt able to take several small steps forwards/backwards with a RW and mod A to prevent LOB with extensive verbal and tactile cues needed to initiate steps  Stairs            Wheelchair Mobility    Modified Rankin (Stroke Patients Only)       Balance Overall balance assessment: Needs assistance Sitting-balance support: Bilateral upper extremity supported;Feet supported Sitting balance-Leahy Scale: Fair  Standing balance support: Bilateral upper extremity supported;During functional activity Standing balance-Leahy Scale: Poor Standing balance comment: Mod A for stability at times during functional activity                              Pertinent Vitals/Pain Pain Assessment: No/denies pain    Home Living Family/patient expects to be discharged to:: Assisted living               Home Equipment: Other (comment)(Unkown) Additional Comments: Pt is a resident at [facility]    Prior Function           Comments: Pt unable to provide history/PLOF, no family available to assist     Hand Dominance        Extremity/Trunk Assessment   Upper Extremity Assessment Upper Extremity Assessment: Generalized weakness;Difficult to assess due to impaired cognition    Lower Extremity Assessment Lower Extremity Assessment: Generalized weakness;Difficult to assess due to impaired cognition       Communication      Cognition Arousal/Alertness: Lethargic Behavior During Therapy: Flat affect Overall Cognitive Status: No family/caregiver present to determine baseline cognitive functioning                                 General Comments: Pt with limited ability to follow 1-step commands without extensive verbal and tactile cuing      General Comments      Exercises     Assessment/Plan    PT Assessment Patient needs continued PT services  PT Problem List Decreased strength;Decreased activity tolerance;Decreased balance;Decreased mobility       PT Treatment Interventions DME instruction;Gait training;Functional mobility training;Therapeutic activities;Therapeutic exercise;Balance training;Patient/family education    PT Goals (Current goals can be found in the Care Plan section)  Acute Rehab PT Goals PT Goal Formulation: Patient unable to participate in goal setting Time For Goal Achievement: 06/14/19 Potential to Achieve Goals: Fair    Frequency Min 2X/week   Barriers to discharge Decreased caregiver support      Co-evaluation               AM-PAC PT "6 Clicks" Mobility  Outcome Measure Help needed turning from your back to your side while in a flat bed without using  bedrails?: A Lot Help needed moving from lying on your back to sitting on the side of a flat bed without using bedrails?: A Lot Help needed moving to and from a bed to a chair (including a wheelchair)?: A Lot Help needed standing up from a chair using your arms (e.g., wheelchair or bedside chair)?: A Lot Help needed to walk in hospital room?: Total Help needed climbing 3-5 steps with a railing? : Total 6 Click Score: 10    End of Session Equipment Utilized During Treatment: Gait belt Activity Tolerance: Patient tolerated treatment well Patient left: in bed;with nursing/sitter in room;with call bell/phone within reach(Pt left with sitter who requested bed alarm off) Nurse Communication: Mobility status PT Visit Diagnosis: Unsteadiness on feet (R26.81);Difficulty in walking, not elsewhere classified (R26.2);Muscle weakness (generalized) (M62.81)    Time: [id number] PT Time Calculation (min) (ACUTE ONLY): 20 min   Charges:   PT Evaluation $PT Eval Moderate Complexity: 1 Mod          D. Scott Alynn Ellithorpe PT, DPT 06/01/19, 3:49 PM

## 2019-06-01 NOTE — Progress Notes (Addendum)
Pt requires Safety sitter 1:1 due to confusion-putting legs over/inbetween side rails, wringing/fidgeting with linens; extreme high fall risk. Unable to redirect pt with minimal response to directions/cues. Seroquel given for agitation and also noted bladder distension by bladder scan. Pt became calmer after both in and out caths today for urinary retention. Pt unable to void-no leaking noted.

## 2019-06-02 DIAGNOSIS — L8990 Pressure ulcer of unspecified site, unspecified stage: Secondary | ICD-10-CM | POA: Insufficient documentation

## 2019-06-02 LAB — GLUCOSE, CAPILLARY
Glucose-Capillary: 128 mg/dL — ABNORMAL HIGH (ref 70–99)
Glucose-Capillary: 206 mg/dL — ABNORMAL HIGH (ref 70–99)
Glucose-Capillary: 113 mg/dL — ABNORMAL HIGH (ref 70–99)
Glucose-Capillary: 208 mg/dL — ABNORMAL HIGH (ref 70–99)

## 2019-06-02 MED ORDER — LORAZEPAM 2 MG/ML IJ SOLN: 0.5000 mg | Freq: Two times a day (BID) | INTRAMUSCULAR | Status: DC | PRN

## 2019-06-02 MED ORDER — HYDRALAZINE HCL 50 MG PO TABS
50.0000 mg | ORAL_TABLET | Freq: Two times a day (BID) | ORAL | Status: DC
  Administered 2019-06-02 – 2019-06-07 (×11): 50 mg via ORAL
  Filled 2019-06-02 (×12): qty 1

## 2019-06-02 MED ORDER — LORAZEPAM 0.5 MG PO TABS
0.5000 mg | ORAL_TABLET | Freq: Two times a day (BID) | ORAL | Status: DC | PRN
  Administered 2019-06-06 (×2): 0.5 mg via ORAL
  Filled 2019-06-02 (×2): qty 1

## 2019-06-02 MED ORDER — QUETIAPINE FUMARATE 25 MG PO TABS
25.0000 mg | ORAL_TABLET | Freq: Three times a day (TID) | ORAL | Status: DC
  Administered 2019-06-02 – 2019-06-07 (×14): 25 mg via ORAL
  Filled 2019-06-02 (×13): qty 1

## 2019-06-02 MED ORDER — LORAZEPAM 2 MG/ML IJ SOLN
0.5000 mg | Freq: Two times a day (BID) | INTRAMUSCULAR | Status: DC | PRN
  Administered 2019-06-04 – 2019-06-05 (×2): 0.5 mg via INTRAMUSCULAR
  Filled 2019-06-02 (×2): qty 1

## 2019-06-02 MED ORDER — LORAZEPAM 0.5 MG PO TABS: 0.5000 mg | ORAL_TABLET | Freq: Two times a day (BID) | ORAL | Status: DC | PRN

## 2019-06-02 MED ORDER — CHLORHEXIDINE GLUCONATE CLOTH 2 % EX PADS
6.0000 | MEDICATED_PAD | Freq: Every day | CUTANEOUS | Status: DC
  Administered 2019-06-03 – 2019-06-07 (×5): 6 via TOPICAL

## 2019-06-02 NOTE — Progress Notes (Signed)
[facility] [place] NAME: Adrian Valdez    MR#:  [id number]  DATE OF BIRTH:  1951-04-05  SUBJECTIVE:  Patient came in from [facility] facility with witnessed generalized tonic clonic seizure which appears to be new. He has history of dementia and has been intermittently agitated and restless.  Per RN, ate good BF and dinner, had to give ativan couple times duering day. Agitated with urinary retention and required multiple in and out--got Foley placed last nite. Received Haldol x1 Calm this am--said a few words this am  Sitter int he room  REVIEW OF SYSTEMS:   Review of Systems  Unable to perform ROS: Mental status change   DRUG ALLERGIES:   Allergies  Allergen Reactions  . Oxybutynin Other (See Comments)    Headache  . Myrbetriq [Mirabegron] Other (See Comments)    Other reaction(s): Other (See Comments) migraine Hallucinations      VITALS:  Blood pressure (!) 144/90, pulse 63, temperature 98 F (36.7 C), temperature source Axillary, resp. rate 18, height 5\' 10"  (1.778 m), weight 89.8 kg, SpO2 100 %.  PHYSICAL EXAMINATION:   Physical Exam limited exam patient is sedated GENERAL:  68 y.o.-year-old patient lying in the bed with no acute distress. Appears chronically ill EYES: Pupils equal, round, reactive to light and accommodation. No scleral icterus. Extraocular muscles intact.  HEENT: Head atraumatic, normocephalic. Oropharynx and nasopharynx clear.  NECK:  Supple, no jugular venous distention. No thyroid enlargement, no tenderness.  LUNGS: Normal breath sounds bilaterally, no wheezing, rales, rhonchi. No use of accessory muscles of respiration.  CARDIOVASCULAR: S1, S2 normal. No murmurs, rubs, or gallops.  ABDOMEN: Soft, nontender, nondistended. Bowel sounds present. No organomegaly or mass.  FOLEY+ on 06/02/2019 EXTREMITIES: No cyanosis, clubbing or edema b/l.    NEUROLOGIC:moves all extremities well PSYCHIATRIC:  patient is  awake SKIN: No obvious rash, lesion, or ulcer.   LABORATORY PANEL:  CBC Recent Labs  Lab 05/31/19 0720  WBC 7.7  HGB 14.0  HCT 42.2  PLT 204    Chemistries  Recent Labs  Lab 05/27/19 0228  05/31/19 0720  NA 139   < > 140  K 3.0*   < > 3.6  CL 101   < > 104  CO2 26   < > 27  GLUCOSE 102*   < > 121*  BUN 18   < > 28*  CREATININE 0.94   < > 1.41*  CALCIUM 8.9   < > 8.9  MG 2.1  --   --   AST 21  --   --   ALT 15  --   --   ALKPHOS 46  --   --   BILITOT 0.7  --   --    < > = values in this interval not displayed.   Cardiac Enzymes No results for input(s): TROPONINI in the last 168 hours. RADIOLOGY:  No results found. ASSESSMENT AND PLAN:  Adrian Arnau. is a 68 y.o. male with a history of advanced dementia, diabetes, hypertension, Waldenstrom macroglobulinemia, anemia admitted after unwitnessed mechanical fall.  Patient was found on the floor next to his bed.  Unclear how long patient was on the floor. Per EMS staff at the nursing facility witness generalized tonic clonic seizures.  1. Generalized tonic clonic seizure new onset, unclear etiology -CT had negative for stroke. Posterior parietal scalp hematoma present on the right -MRI brain right posterior parietal scalp hematoma -Neurology following.   -  Continue  Keppra 500 mg BID -EEG did not show any new seizure -PRN Ativan -Seizure precaution  2.DM-2 -Blood sugar better controlled at this time--not on any meds at present. Cont SSI TID  3.  Agitation - very agitated when not sedated per nursing. -Unfortunately pt's  Facility declined taking him back with level of care he needs now. They feel he needs rehab. I can't imagine this person able to do rehab when he can barely even wake up and when he does - so agitated. They've asked PT eval --recommends rehab -Psych input appreciated  -d/ced Risperdione -started now on Seroquel 25 mg qhs and bid prn -overall poor prognosis  4. Unwitnessed fall with right  posterior parietal scalp hematoma -avoid antiplatelet agent. DC Lovenox  5. Hypokalemia repleted potassium  6. History of dementia, chronic -resumed chronic dementia meds  7. Urinary rentention ?etio -in and out cath prn--now has FOLEY (06/02/2019)  Palliative care  Adrian Valdez had discussion with patient's brother Adrian Valdez [facility]) who would like aggressive care and full code however Dr Manuella Ghazi has d/w brother Adrian Valdez and pt is now DNR  Adrian Valdez declined to take him back. This will be very difficult disposition. PT recommends rehab.  CODE STATUS: DNR (based on Dr Trena Platt d/w patient's HCPOA on 9/24)  DVT Prophylaxis: SCD  TOTAL TIME TAKING CARE OF THIS PATIENT: 30 minutes.  >50% time spent on counselling and coordination of care  POSSIBLE D/C IN  ? DAYS, DEPENDING ON CLINICAL CONDITION.  Note: This dictation was prepared with Dragon dictation along with smaller phrase technology. Any transcriptional errors that result from this process are unintentional.  Fritzi Mandes M.D on 06/02/2019 at 8:25 AM  Between 7am to 6pm - Pager - [phone]  After 6pm go to [email or web address] - password [email or web address] [facility]  Office  [phone]  CC: Primary care physician; Lawson Radar, NPPatient ID: Adrian Gibbon., male   DOB: 04/06/1951, 68 y.o.   MRN: [id number]
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
## 2019-06-02 NOTE — Progress Notes (Addendum)
Pt continues to require safety 1:1 sitter with pt becoming agitated and striking out at staff- seroquel PRN dose given with minimal response. Pt tries to pull foley out, putting legs over/inbetween side rails, fidgeting and twisting bed linens and his gown dispute all interventions. Requires total care/unable to communicate needs. Up to [facility] with 1+ strong assist for stooling. Pt alternates between sleeping and agitated/restless states.

## 2019-06-02 NOTE — Progress Notes (Addendum)
Patient bladder scanned around 2045 and it showed > 621. MD notified to see if they wanted indwelling catheter since patient had still be unable to void and this was 3 in and out cath for the day. Dr. Boyce Medici said to hold off so as not to further agitate patient by leaving catheter in.  in and out cath performed last night around 2130. Initial urine had a pink tinge. Rest of urine output was yellow. Total output was 759ml. Patient had small amount of bright red bleeding from urethra once catheter was removed. Monitored and bleeding stopped.  Patient bladder scanned this morning per order. Bladder scan showed > 900. With irritation noted during last cath episode provider notified to see if they wanted indwelling cath. Per provider E. Ouma NP place indwelling catheter. 1:1 sitter at bedside, will have monitor closely. Will also place hand mitts on patient.

## 2019-06-03 LAB — BASIC METABOLIC PANEL
Anion gap: 10 (ref 5–15)
BUN: 22 mg/dL (ref 8–23)
CO2: 30 mmol/L (ref 22–32)
Calcium: 8.5 mg/dL — ABNORMAL LOW (ref 8.9–10.3)
Chloride: 98 mmol/L (ref 98–111)
Creatinine, Ser: 1.06 mg/dL (ref 0.61–1.24)
GFR calc Af Amer: >60 mL/min (ref >60)
GFR calc non Af Amer: >60 mL/min (ref >60)
Glucose, Bld: 239 mg/dL — ABNORMAL HIGH (ref 70–99)
Potassium: 3.5 mmol/L (ref 3.5–5.1)
Sodium: 138 mmol/L (ref 135–145)

## 2019-06-03 LAB — GLUCOSE, CAPILLARY
Glucose-Capillary: 197 mg/dL — ABNORMAL HIGH (ref 70–99)
Glucose-Capillary: 169 mg/dL — ABNORMAL HIGH (ref 70–99)
Glucose-Capillary: 138 mg/dL — ABNORMAL HIGH (ref 70–99)
Glucose-Capillary: 127 mg/dL — ABNORMAL HIGH (ref 70–99)

## 2019-06-03 MED ORDER — DOCUSATE SODIUM 50 MG/5ML PO LIQD
100.0000 mg | Freq: Two times a day (BID) | ORAL | Status: DC
  Administered 2019-06-03 – 2019-06-05 (×4): 100 mg via ORAL
  Filled 2019-06-03 (×5): qty 10

## 2019-06-03 NOTE — Care Management Important Message (Signed)
Important Message  Patient Details  Name: Adrian Valdez. MRN: [id number] Date of Birth: 10-06-1950   Medicare Important Message Given:  Yes     Dannette Barbara 06/03/2019, 10:17 AM

## 2019-06-03 NOTE — Progress Notes (Signed)
[place] at [place] NAME: Adrian Valdez    MR#:  [id number]  DATE OF BIRTH:  02-06-1951  SUBJECTIVE:  Patient came in from [place] facility with witnessed generalized tonic clonic seizure which appears to be new. He has history of dementia and has been intermittently agitated and restless.  Patient has been intermittently threatening staff and hitting out of them. Received Ativan. Psychiatry following. Currently sedated. Good breakfast earlier [employer] int he room  REVIEW OF SYSTEMS:   Review of Systems  Unable to perform ROS: Mental status change   DRUG ALLERGIES:   Allergies  Allergen Reactions  . Oxybutynin Other (See Comments)    Headache  . Myrbetriq [Mirabegron] Other (See Comments)    Other reaction(s): Other (See Comments) migraine Hallucinations      VITALS:  Blood pressure 138/88, pulse 86, temperature 98.7 F (37.1 C), temperature source Oral, resp. rate 17, height 5\' 10"  (1.778 m), weight 90.7 kg, SpO2 96 %.  PHYSICAL EXAMINATION:   Physical Exam limited exam patient is sedated GENERAL:  68 y.o.-year-old patient lying in the bed with no acute distress. Appears chronically ill EYES: Pupils equal, round, reactive to light and accommodation. No scleral icterus. Extraocular muscles intact.  HEENT: Head atraumatic, normocephalic. Oropharynx and nasopharynx clear.  NECK:  Supple, no jugular venous distention. No thyroid enlargement, no tenderness.  LUNGS: Normal breath sounds bilaterally, no wheezing, rales, rhonchi. No use of accessory muscles of respiration.  CARDIOVASCULAR: S1, S2 normal. No murmurs, rubs, or gallops.  ABDOMEN: Soft, nontender, nondistended. Bowel sounds present. No organomegaly or mass.  FOLEY+ on 06/02/2019 EXTREMITIES: No cyanosis, clubbing or edema b/l.    NEUROLOGIC:moves all extremities well PSYCHIATRIC:  patient is awake SKIN: No obvious rash, lesion, or ulcer.   LABORATORY PANEL:  CBC Recent  Labs  Lab 05/31/19 0720  WBC 7.7  HGB 14.0  HCT 42.2  PLT 204    Chemistries  Recent Labs  Lab 06/03/19 0923  NA 138  K 3.5  CL 98  CO2 30  GLUCOSE 239*  BUN 22  CREATININE 1.06  CALCIUM 8.5*   Cardiac Enzymes No results for input(s): TROPONINI in the last 168 hours. RADIOLOGY:  No results found. ASSESSMENT AND PLAN:  Adrian Valdez. is a 68 y.o. male with a history of advanced dementia, diabetes, hypertension, Waldenstrom macroglobulinemia, anemia admitted after unwitnessed mechanical fall.  Patient was found on the floor next to his bed.  Unclear how long patient was on the floor. Per EMS staff at the nursing facility witness generalized tonic clonic seizures.  1. Generalized tonic clonic seizure new onset, unclear etiology -CT had negative for stroke. Posterior parietal scalp hematoma present on the right -MRI brain right posterior parietal scalp hematoma -Neurology following.   -Continue  Keppra 500 mg BID -EEG did not show any new seizure -PRN Ativan -Seizure precaution  2.DM-2 -Blood sugar better controlled at this time--not on any meds at present. Cont SSI TID  3.  Agitation - very agitated when not sedated per nursing. -Unfortunately pt's  Facility declined taking him back with level of care he needs now. They feel he needs rehab. I can't imagine this person able to do rehab when he can barely even wake up and when he does - so agitated. They've asked PT eval --recommends rehab -Psych input appreciated  -d/ced Risperdione -started now on Seroquel 25 mg qhs and bid prn -overall poor prognosis  4. Unwitnessed fall with right  posterior parietal scalp hematoma -avoid antiplatelet agent. DC Lovenox  5. Hypokalemia repleted potassium  6. History of dementia, chronic -resumed chronic dementia meds  7. Urinary rentention ?etio -in and out cath prn--now has FOLEY (06/02/2019)  Palliative care  Megan had discussion with patient's brother Adrian Valdez [facility]) who  would like aggressive care and full code however Dr Manuella Ghazi has d/w brother [name] and pt is now DNR  [facility] declined to take him back. This will be very difficult disposition. PT recommends rehab. Spoke with Adrian Valdez Brother  CODE STATUS: DNR (based on Dr Trena Platt d/w patient's HCPOA on 9/24)  DVT Prophylaxis: SCD  TOTAL TIME TAKING CARE OF THIS PATIENT: 30 minutes.  >50% time spent on counselling and coordination of care  POSSIBLE D/C IN  ? DAYS, DEPENDING ON CLINICAL CONDITION.  Note: This dictation was prepared with Dragon dictation along with smaller phrase technology. Any transcriptional errors that result from this process are unintentional.  Fritzi Mandes M.D on 06/03/2019 at 11:51 AM  Between 7am to 6pm - Pager - [phone]  After 6pm go to [email or web address] - password [email or web address] [place] [facility]  Office  [phone]  CC: Primary care physician; Lawson Radar, NPPatient ID: Adrian Valdez., male   DOB: 04/06/1951, 68 y.o.   MRN: [id number]

## 2019-06-04 DIAGNOSIS — F321 Major depressive disorder, single episode, moderate: Secondary | ICD-10-CM

## 2019-06-04 LAB — GLUCOSE, CAPILLARY
Glucose-Capillary: 110 mg/dL — ABNORMAL HIGH (ref 70–99)
Glucose-Capillary: 186 mg/dL — ABNORMAL HIGH (ref 70–99)
Glucose-Capillary: 189 mg/dL — ABNORMAL HIGH (ref 70–99)
Glucose-Capillary: 131 mg/dL — ABNORMAL HIGH (ref 70–99)

## 2019-06-04 MED ORDER — ENSURE MAX PROTEIN PO LIQD
11.0000 [oz_av] | Freq: Two times a day (BID) | ORAL | Status: DC
  Administered 2019-06-05 – 2019-06-12 (×15): 11 [oz_av] via ORAL
  Filled 2019-06-04: qty 330

## 2019-06-04 MED ORDER — ADULT MULTIVITAMIN W/MINERALS CH
1.0000 | ORAL_TABLET | Freq: Every day | ORAL | Status: DC
  Administered 2019-06-05 – 2019-06-13 (×9): 1 via ORAL
  Filled 2019-06-04 (×9): qty 1

## 2019-06-04 NOTE — Progress Notes (Signed)
PT Cancellation Note  Patient Details Name: Adrian Valdez. MRN: [id number] DOB: [birth year]   Cancelled Treatment:    Reason Eval/Treat Not Completed: Other (comment).  Chart reviewed.  Pt's nurse recommending holding PT at this time d/t safety concerns/agitation concerns.  Will hold PT at this time and re-attempt PT treatment session at a later date/time as appropriate.  Leitha Bleak, PT 06/04/19, 1:45 PM [phone]

## 2019-06-04 NOTE — Progress Notes (Signed)
[place] at [place] NAME: Adrian Valdez    MR#:  [id number]  DATE OF BIRTH:  1951/06/12  SUBJECTIVE:  Patient came in from [place] facility with witnessed generalized tonic clonic seizure which appears to be new. He has history of dementia and has been intermittently agitated and restless.  Sedated with ativan, [employer] int he room  REVIEW OF SYSTEMS:   Review of Systems  Unable to perform ROS: Mental status change   DRUG ALLERGIES:   Allergies  Allergen Reactions  . Oxybutynin Other (See Comments)    Headache  . Myrbetriq [Mirabegron] Other (See Comments)    Other reaction(s): Other (See Comments) migraine Hallucinations      VITALS:  Blood pressure (!) 145/105, pulse 92, temperature 98.2 F (36.8 C), resp. rate 18, height 5\' 10"  (1.778 m), weight 89.4 kg, SpO2 97 %.  PHYSICAL EXAMINATION:   Physical Exam limited exam patient is sedated GENERAL:  68 y.o.-year-old patient lying in the bed with no acute distress. Appears chronically ill EYES: Pupils equal, round, reactive to light and accommodation. No scleral icterus. Extraocular muscles intact.  HEENT: Head atraumatic, normocephalic. Oropharynx and nasopharynx clear.  NECK:  Supple, no jugular venous distention. No thyroid enlargement, no tenderness.  LUNGS: Normal breath sounds bilaterally, no wheezing, rales, rhonchi. No use of accessory muscles of respiration.  CARDIOVASCULAR: S1, S2 normal. No murmurs, rubs, or gallops.  ABDOMEN: Soft, nontender, nondistended. Bowel sounds present. No organomegaly or mass.  FOLEY+ on 06/02/2019 EXTREMITIES: No cyanosis, clubbing or edema b/l.    NEUROLOGIC:moves all extremities well PSYCHIATRIC:  patient is sedated SKIN: No obvious rash, lesion, or ulcer.   LABORATORY PANEL:  CBC Recent Labs  Lab 05/31/19 0720  WBC 7.7  HGB 14.0  HCT 42.2  PLT 204    Chemistries  Recent Labs  Lab 06/03/19 0923  NA 138  K 3.5  CL 98   CO2 30  GLUCOSE 239*  BUN 22  CREATININE 1.06  CALCIUM 8.5*   Cardiac Enzymes No results for input(s): TROPONINI in the last 168 hours. RADIOLOGY:  No results found. ASSESSMENT AND PLAN:  Adrian Valdez. is a 68 y.o. male with a history of advanced dementia, diabetes, hypertension, Waldenstrom macroglobulinemia, anemia admitted after unwitnessed mechanical fall.  Patient was found on the floor next to his bed.  Unclear how long patient was on the floor. Per EMS staff at the nursing facility witness generalized tonic clonic seizures.  1. Generalized tonic clonic seizure new onset, unclear etiology -CT had negative for stroke. Posterior parietal scalp hematoma present on the right -MRI brain right posterior parietal scalp hematoma -Neurology following.   -Continue  Keppra 500 mg BID -EEG did not show any new seizure -PRN Ativan -Seizure precaution  2.DM-2 -Blood sugar better controlled at this time--not on any meds at present. Cont SSI TID  3.  Agitation - very agitated when not sedated per nursing. -Unfortunately pt's  Facility declined taking him back with level of care he needs now. They feel he needs rehab. I can't imagine this person able to do rehab when he can barely even wake up and when he does - so agitated. They've asked PT eval --recommends rehab -Psych input appreciated  -d/ced Risperdione -continue Seroquel 25 mg qhs and bid prn -overall poor prognosis  4. Unwitnessed fall with right posterior parietal scalp hematoma -avoid antiplatelet agent. DC Lovenox  5. Hypokalemia repleted potassium  6. History of dementia, chronic -  resumed chronic dementia meds  7. Urinary rentention ?etio - FOLEY placed (06/02/2019)  Adrian Valdez declined to take him back. This will be very difficult disposition. PT recommends rehab.   CODE STATUS: DNR (based on my d/w patient's HCPOA on 9/24)  DVT Prophylaxis: SCD  TOTAL TIME TAKING CARE OF THIS PATIENT: 30 minutes.  >50% time  spent on counselling and coordination of care  POSSIBLE D/C IN  ? DAYS, DEPENDING ON CLINICAL CONDITION.  Note: This dictation was prepared with Dragon dictation along with smaller phrase technology. Any transcriptional errors that result from this process are unintentional.  Max Sane M.D on 06/04/2019 at 5:20 PM  Between 7am to 6pm - Pager - [phone]  After 6pm go to [email or web address] - password [email or web address] [place] [facility]  Office  [phone]  CC: Primary care physician; Lawson Radar, NPPatient ID: Adrian Valdez., male   DOB: Feb 08, 1951, 68 y.o.   MRN: [id number]

## 2019-06-04 NOTE — Progress Notes (Signed)
Initial Nutrition Assessment  DOCUMENTATION CODES:   Not applicable  INTERVENTION:  Provide Ensure Max Protein po BID, each supplement provides 150 kcal and 30 grams of protein.  Provide daily MVI.  NUTRITION DIAGNOSIS:   Inadequate oral intake related to decreased appetite as evidenced by per patient/family report.  GOAL:   Patient will meet greater than or equal to 90% of their needs  MONITOR:   PO intake, Supplement acceptance, Labs, Weight trends, Skin  REASON FOR ASSESSMENT:   Malnutrition Screening Tool    ASSESSMENT:   68 year old male with PMHx of dementia, glaucoma, vitamin B12 deficiency, HTN, DM, neuropathy admitted with new-onset generalized tonic clonic seizure, also with agitation.   Met with patient at bedside. Safety sitter present. He is requiring full assistance with meals. He is not a great historian but does report his appetite is decreased. Intake is variable per chart (30-100%). He is amenable to drinking Ensure to help meet calorie/protein needs.  Patient is unsure of his weight history. Per chart he appears to be around 90 kg usually though the weights do fluctuate. He is currently 89.4 kg (197 lbs).  Medications reviewed and include: Colace 100 mg BID, Novolog 0-9 units TID, Novolog 0-5 units QHS, Keppra, Seroquel, Flomax, vitamin B12 1000 micrograms daily PO.  Labs reviewed: CBG 110-186.  NUTRITION - FOCUSED PHYSICAL EXAM:  Deferred at this time as patient has been agitated and aggressive per chart.  Diet Order:   Diet Order            Diet - low sodium heart healthy        Diet heart healthy/carb modified Room service appropriate? Yes; Fluid consistency: Thin  Diet effective now              EDUCATION NEEDS:   No education needs have been identified at this time  Skin:  Skin Assessment: Skin Integrity Issues:(stg I bilateral elbows)  Last BM:  06/02/2019 per chart  Height:   Ht Readings from Last 1 Encounters:  05/27/19 5'  10" (1.778 m)   Weight:   Wt Readings from Last 1 Encounters:  06/04/19 89.4 kg   Ideal Body Weight:  75.5 kg  BMI:  Body mass index is 28.27 kg/m.  Estimated Nutritional Needs:   Kcal:  2000-2200  Protein:  100-110 grams  Fluid:  2 L/day  Willey Blade, MS, RD, LDN Office: [phone] Pager: [phone] After Hours/Weekend Pager: [phone]

## 2019-06-05 LAB — GLUCOSE, CAPILLARY
Glucose-Capillary: 123 mg/dL — ABNORMAL HIGH (ref 70–99)
Glucose-Capillary: 236 mg/dL — ABNORMAL HIGH (ref 70–99)
Glucose-Capillary: 177 mg/dL — ABNORMAL HIGH (ref 70–99)
Glucose-Capillary: 120 mg/dL — ABNORMAL HIGH (ref 70–99)

## 2019-06-05 MED ORDER — SENNOSIDES-DOCUSATE SODIUM 8.6-50 MG PO TABS
1.0000 | ORAL_TABLET | Freq: Two times a day (BID) | ORAL | Status: DC
  Administered 2019-06-05 – 2019-06-14 (×18): 1 via ORAL
  Filled 2019-06-05 (×18): qty 1

## 2019-06-05 NOTE — Progress Notes (Signed)
Daily Progress Note   Patient Name: Adrian Valdez.       Date: 06/05/2019 DOB: [birth year]  Age: 68 y.o. MRN#: [id number] Attending Physician: Fritzi Mandes, MD Primary Care Physician: Lawson Radar, NP Admit Date: 05/27/2019  Reason for Consultation/Follow-up: Establishing goals of care  Subjective: Patient lying in bed being fed by NT. Updates provided from RN. Although patient is tolerating well he continues to have decreased appetite. He requires feeding at this time. No signs of agitation at the moment, although sitter reports patient has been demonstrating combative behavior throughout the morning. She reports this is the calmest he has been. Mitts are in place for safety.   Goals remain to seek placement once medically stable with available options for outpatient palliative support. Palliative will continue to follow peripherally for further changes and/or decline. If patient does not show improvement goals may have to be readdressed. Nephew [name] remains hopeful that he will improve and be able to discharge from the hospital.   Length of Stay: 9  Current Medications: Scheduled Meds:  . Chlorhexidine Gluconate Cloth  6 each Topical Q0600  . docusate  100 mg Oral BID  . donepezil  10 mg Oral QHS  . hydrALAZINE  50 mg Oral BID  . insulin aspart  0-5 Units Subcutaneous QHS  . insulin aspart  0-9 Units Subcutaneous TID WC  . latanoprost  1 drop Both Eyes QHS  . levETIRAcetam  500 mg Oral BID  . memantine  10 mg Oral BID  . multivitamin with minerals  1 tablet Oral Daily  . Ensure Max Protein  11 oz Oral BID BM  . QUEtiapine  25 mg Oral TID  . tamsulosin  0.4 mg Oral Daily  . timolol  1 drop Both Eyes BID  . [START ON 06/13/2019] vitamin B-12  1,000 mcg Oral Daily    Continuous  Infusions:   PRN Meds: acetaminophen **OR** acetaminophen, hydrALAZINE, LORazepam **OR** LORazepam, ondansetron **OR** ondansetron (ZOFRAN) IV  Physical Exam         -awake, alert, periods of agitation/comabative, chronically-ill appearing -RRR -CTA bilaterally -Unable to assess mentation, will not follow commands  Vital Signs: BP (!) 157/78 (BP Location: Left Leg)   Pulse 68   Temp 98.8 F (37.1 C) (Oral)   Resp 18   Ht 5\' 10"  (  1.778 m)   Wt 89.4 kg   SpO2 99%   BMI 28.27 kg/m  SpO2: SpO2: 99 % O2 Device: O2 Device: Room Air O2 Flow Rate:    Intake/output summary:   Intake/Output Summary (Last 24 hours) at 06/05/2019 0945 Last data filed at 06/05/2019 [id number] Gross per 24 hour  Intake -  Output 650 ml  Net -650 ml   LBM: Last BM Date: 06/02/19 Baseline Weight: Weight: 83.1 kg Most recent weight: Weight: 89.4 kg       Palliative Assessment/Data: PPS 40%   Flowsheet Rows     Most Recent Value  Intake Tab  Referral Department  Hospitalist  Unit at Time of Referral  Med/Surg Unit  Date Notified  05/28/19  Palliative Care Type  New Palliative care  Reason for referral  Clarify Goals of Care  Date of Admission  05/27/19  Date first seen by Palliative Care  05/29/19  # of days Palliative referral response time  1 Day(s)  # of days IP prior to Palliative referral  1  Clinical Assessment  Psychosocial & Spiritual Assessment  Palliative Care Outcomes      Patient Active Problem List   Diagnosis Date Noted  . Pressure injury of skin 06/02/2019  . Palliative care by specialist   . Fall   . Hematoma of scalp   . Dementia with behavioral disturbance ([place])   . Seizure ([place]) 05/27/2019  . Cellulitis 01/03/2019  . Necrosis of finger ([place]) 01/03/2019  . Cellulitis of hand 10/30/2018  . Goals of care, counseling/discussion 11/20/2016  . Encounter for antineoplastic chemotherapy 10/30/2016  . Altered mental status 09/22/2016  . Waldenstrom's macroglobulinemia ([place])  09/01/2016  . Enlarged prostate with urinary obstruction 08/16/2016  . Prostate cancer screening 07/19/2016  . Right Non-Complex Renal Cyst 07/19/2016  . Dysuria 11/30/2015  . Urgency of urination 11/30/2015  . Nocturia 11/30/2015  . 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 B12 deficiency 07/27/2015  . 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 Confusion state 07/07/2015  . BP (high blood pressure) 07/07/2015  . Major depressive disorder, single episode, moderate ([place]) 07/07/2015  . FOM (frequency of micturition) 07/07/2015    Palliative Care Assessment & Plan   Recommendations/Plan:  PT recommending SNF for rehab. Outpatient palliative available at minimum.   PMT will continue to support and follow as needed peripherally for further needs or decline in health. Please call if needed.   Goals of Care and Additional Recommendations:  Limitations on Scope of Treatment: Full Scope Treatment  Code Status:    Code Status Orders  (From admission, onward)         Start     Ordered   05/31/19 0853  Do not attempt resuscitation (DNR)  Continuous    Question Answer Comment  In the event of cardiac or respiratory ARREST Do not call a "code blue"   In the event of cardiac or respiratory ARREST Do not perform Intubation, CPR, defibrillation or ACLS   In the event of cardiac or respiratory ARREST Use medication by any route, position, wound care, and other measures to relive pain and suffering. May use oxygen, suction and manual treatment of airway obstruction as needed for comfort.      05/31/19 0852        Code Status History    Date Active Date Inactive Code Status Order ID Comments User Context   05/27/2019 0716 05/31/2019 0852 Full Code [id number]  Harrie Foreman, MD ED  01/04/2019 0115 01/04/2019 2026 Full Code [id number]  Leanora Cover, MD Inpatient   01/04/2019 0115 01/04/2019 0115 DNR [id number]  [place] Bing, DO Inpatient   10/30/2018 2053  11/02/2018 1531 Full Code [id number]  Henreitta Leber, MD Inpatient   Advance Care Planning Activity    Advance Directive Documentation     Most Recent Value  Type of Advance Directive  Healthcare Power of Attorney, Living will  Pre-existing out of facility DNR order (yellow form or pink MOST form)  -  "MOST" Form in Place?  -      Prognosis:   Unable to determine ([place])  Discharge Planning:  To Be Determined  Care plan was discussed with bedside RN and family   Thank you for allowing the Palliative Medicine Team to assist in the care of this patient.  Total Time: 20 min.   Greater than 50%  of this time was spent counseling and coordinating care related to the above assessment and plan.  Alda Lea, AGPCNP-BC Palliative Medicine Team  Phone: [phone] Pager: [phone] Amion: Bjorn Pippin    Please contact Palliative Medicine Team phone at [phone] for questions and concerns.

## 2019-06-05 NOTE — Progress Notes (Addendum)
[facility] [place] NAME: Adrian Valdez    MR#:  [id number]  DATE OF BIRTH:  1950-09-11  SUBJECTIVE:  Patient came in from [place] facility with witnessed generalized tonic clonic seizure which appears to be new. He has history of dementia and has been intermittently agitated and restless.  Sedated with ativan, Safety Sitter int he room patient still is getting intermittently Ativan. He tries to pull his Foley. He has meds in his hands. It's been difficult to manage him without a sitter in the room.  REVIEW OF SYSTEMS:   Review of Systems  Unable to perform ROS: Mental status change   DRUG ALLERGIES:   Allergies  Allergen Reactions  . Oxybutynin Other (See Comments)    Headache  . Myrbetriq [Mirabegron] Other (See Comments)    Other reaction(s): Other (See Comments) migraine Hallucinations      VITALS:  Blood pressure (!) 149/82, pulse 70, temperature 97.9 F (36.6 C), temperature source Oral, resp. rate 16, height 5\' 10"  (1.778 m), weight 89.4 kg, SpO2 99 %.  PHYSICAL EXAMINATION:   Physical Exam limited exam patient is sedated GENERAL:  68 y.o.-year-old patient lying in the bed with no acute distress. Appears chronically ill EYES: Pupils equal, round, reactive to light and accommodation. No scleral icterus. Extraocular muscles intact.  HEENT: Head atraumatic, normocephalic. Oropharynx and nasopharynx clear.  NECK:  Supple, no jugular venous distention. No thyroid enlargement, no tenderness.  LUNGS: Normal breath sounds bilaterally, no wheezing, rales, rhonchi. No use of accessory muscles of respiration.  CARDIOVASCULAR: S1, S2 normal. No murmurs, rubs, or gallops.  ABDOMEN: Soft, nontender, nondistended. Bowel sounds present. No organomegaly or mass.  FOLEY+ on 06/02/2019 EXTREMITIES: No cyanosis, clubbing or edema b/l.    NEUROLOGIC:moves all extremities well PSYCHIATRIC:  patient is sedated SKIN: No obvious rash, lesion, or  ulcer.   LABORATORY PANEL:  CBC Recent Labs  Lab 05/31/19 0720  WBC 7.7  HGB 14.0  HCT 42.2  PLT 204    Chemistries  Recent Labs  Lab 06/03/19 0923  NA 138  K 3.5  CL 98  CO2 30  GLUCOSE 239*  BUN 22  CREATININE 1.06  CALCIUM 8.5*   Cardiac Enzymes No results for input(s): TROPONINI in the last 168 hours. RADIOLOGY:  No results found. ASSESSMENT AND PLAN:  Adrian Valdez. is a 68 y.o. male with a history of advanced dementia, diabetes, hypertension, Waldenstrom macroglobulinemia, anemia admitted after unwitnessed mechanical fall.  Patient was found on the floor next to his bed.  Unclear how long patient was on the floor. Per EMS staff at the nursing facility witness generalized tonic clonic seizures.  1. Generalized tonic clonic seizure new onset, unclear etiology -CT had negative for stroke. Posterior parietal scalp hematoma present on the right -MRI brain right posterior parietal scalp hematoma -Neurology following.   -Continue  Keppra 500 mg BID -EEG did not show any new seizure -PRN Ativan -Seizure precaution  2.DM-2 -Blood sugar better controlled at this time--not on any meds at present. Cont SSI TID  3.  Agitation - very agitated when not sedated per nursing. -Unfortunately pt's  Facility declined taking him back with level of care he needs now. They feel he needs rehab. I can't imagine this person able to do rehab when he can barely even wake up and when he does - so agitated. They've asked PT eval --recommends rehab -Psych input appreciated  -d/ced Risperdione -continue Seroquel 25 mg  tid and prn Ativan ordered by psychiatry -overall poor prognosis  4. Unwitnessed fall with right posterior parietal scalp hematoma -avoid antiplatelet agent. DC Lovenox  5. Hypokalemia repleted potassium  6. History of dementia, chronic -resumed chronic dementia meds  7. Urinary rentention ?etio - FOLEY placed (06/02/2019)  [facility] to [facility] him back.  This will be very difficult disposition. PT recommends rehab.   CODE STATUS: DNR (based on dr shah's d/w patient's HCPOA on 9/24)  DVT Prophylaxis: SCD  TOTAL TIME TAKING CARE OF THIS PATIENT: 30 minutes.  >50% time spent on counselling and coordination of care  POSSIBLE D/C IN  ? DAYS, DEPENDING ON CLINICAL CONDITION.  Note: This dictation was prepared with Dragon dictation along with smaller phrase technology. Any transcriptional errors that result from this process are unintentional.  Fritzi Mandes M.D on 06/05/2019 at 12:26 PM  Between 7am to 6pm - Pager - [phone]  After 6pm go to [email or web address] - password [email or web address] [facility]  Office  [phone]  CC: Primary care physician; Lawson Radar, NPPatient ID: Adrian Valdez., male   DOB: 1951-08-18, 67 y.o.   MRN: [id number]

## 2019-06-05 NOTE — Care Management Important Message (Signed)
Important Message  Patient Details  Name: Adrian Valdez. MRN: [id number] Date of Birth: 09-23-1950   Medicare Important Message Given:  Yes     [name] Deeya Richeson 06/05/2019, 10:55 AM

## 2019-06-05 NOTE — Progress Notes (Addendum)
Received call from [facility] asking for sitter order for safety rounder.  Safety rounder helping answer bed alarm, feeding pt, providing entertainment, etc for pt. Dr Jannifer Franklin placed the order for sitter. Pt given medication in ice cream and pt ate whole carton. Pt has stuffed animal and has been given mints and a piece of chocolate per instructions from family to help calm pt.  Pt currently laying in bed, talking , calm. Pt in low bed with bed alarm and mats on floor. Dorna Bloom RN

## 2019-06-05 NOTE — Progress Notes (Signed)
Physical Therapy Treatment Patient Details Name: Adrian Valdez Male. MRN: [id number] DOB: 1951/05/31 Today's Date: 06/05/2019    History of Present Illness Per MD H&P: Pt is a 68 yo male with past medical history of hypertension, diabetes with neuropathy, dementia and Waldenstrm's gammaglobulinemia presents to the emergency department from his living facility after being found on the floor.  The patient was conscious and was alert and oriented.  Initial work-up in the emergency department was unremarkable and the patient was being prepared for discharge when he had a witnessed seizure.  Seizure was generalized tonic-clonic.  It lasted for approximately 30 seconds.  The patient did become hypoxic during the episode and required bag mask ventilation, but he did not lose pulse.  He was given 1 mg of Ativan and seizure activity stopped.  He has not had seizures before.  MD assessment 9/26 includes: Generalized tonic clonic seizure new onset, unclear etiology, Relative hypoglycemia, agitation, Unwitnessed fall with right posterior parietal scalp hematoma, hypokalemia, and h/o dementia.    PT Comments    Pt resting in bed upon PT arrival; sitter present.  Attempted to have pt bring B LE's towards edge of bed to sit up but pt just brought both LE's back onto bed.  Utilized 2 assist (and use of bed sheets) to sit pt up onto edge of bed.  SBA for sitting balance.  Trialed standing x2 attempts with RW use (and 2 assist) but pt did not initiate to help with standing either attempt and eventually therapist assisted pt back into bed.  Utilized max vc's and tactile cues for functional mobility and simple one step commands (but pt with minimal ability to follow 1 step commands with max vc's and tactile cues).  Will continue to trial pt for STR to attempt to improve ability to participate and also to progress functional mobility in general.   Follow Up Recommendations  SNF(trial)     Equipment Recommendations  (TBD  at next facility)    Recommendations for Other Services       Precautions / Restrictions Precautions Precautions: Fall;Other (comment) Precaution Comments: Seizure precautions Restrictions Weight Bearing Restrictions: No    Mobility  Bed Mobility Overal bed mobility: Needs Assistance Bed Mobility: Supine to Sit;Sit to Supine     Supine to sit: +2 for physical assistance Sit to supine: +2 for physical assistance   General bed mobility comments: 2 assist semi-supine to/from sit d/t pt's difficulty following 1 step commands  Transfers                 General transfer comment: pt did not initiate to attempt to perform standing with max vc's and tactile cues  Ambulation/Gait                 Stairs             Wheelchair Mobility    Modified Rankin (Stroke Patients Only)       Balance Overall balance assessment: Needs assistance Sitting-balance support: No upper extremity supported;Feet supported Sitting balance-Leahy Scale: Good Sitting balance - Comments: steady sitting reaching within BOS                                    Cognition Arousal/Alertness: (pt appearing sleepy beginning of session but woken with cueing and activity) Behavior During Therapy: Flat affect Overall Cognitive Status: No family/caregiver present to determine baseline cognitive functioning  General Comments: Minimal ability to follow 1 step commands with max vc's and tactile cues      Exercises      General Comments General comments (skin integrity, edema, etc.): B mitts in place.  Nursing cleared pt for participation in physical therapy.       Pertinent Vitals/Pain Pain Assessment: Faces Faces Pain Scale: No hurt Pain Intervention(s): Limited activity within patient's tolerance;Monitored during session;Repositioned    Home Living                      Prior Function            PT Goals  (current goals can now be found in the care plan section) Acute Rehab PT Goals PT Goal Formulation: Patient unable to participate in goal setting Time For Goal Achievement: 06/14/19 Potential to Achieve Goals: Fair Progress towards PT goals: Not progressing toward goals - comment(pt did not initiate to attempt to stand)    Frequency    Min 2X/week      PT Plan Current plan remains appropriate    Co-evaluation              AM-PAC PT "6 Clicks" Mobility   Outcome Measure  Help needed turning from your back to your side while in a flat bed without using bedrails?: A Lot Help needed moving from lying on your back to sitting on the side of a flat bed without using bedrails?: A Lot Help needed moving to and from a bed to a chair (including a wheelchair)?: Total Help needed standing up from a chair using your arms (e.g., wheelchair or bedside chair)?: Total Help needed to walk in hospital room?: Total Help needed climbing 3-5 steps with a railing? : Total 6 Click Score: 8    End of Session Equipment Utilized During Treatment: Gait belt Activity Tolerance: Patient tolerated treatment well Patient left: in bed(sitter present) Nurse Communication: Mobility status;Precautions PT Visit Diagnosis: Unsteadiness on feet (R26.81);Difficulty in walking, not elsewhere classified (R26.2);Muscle weakness (generalized) (M62.81)     Time: [id number] PT Time Calculation (min) (ACUTE ONLY): 23 min  Charges:  $Therapeutic Activity: 23-37 mins                    Leitha Bleak, PT 06/05/19, 4:22 PM [phone]

## 2019-06-06 LAB — GLUCOSE, CAPILLARY
Glucose-Capillary: 141 mg/dL — ABNORMAL HIGH (ref 70–99)
Glucose-Capillary: 223 mg/dL — ABNORMAL HIGH (ref 70–99)
Glucose-Capillary: 122 mg/dL — ABNORMAL HIGH (ref 70–99)
Glucose-Capillary: 191 mg/dL — ABNORMAL HIGH (ref 70–99)

## 2019-06-06 MED ORDER — HALOPERIDOL LACTATE 5 MG/ML IJ SOLN
5.0000 mg | Freq: Once | INTRAMUSCULAR | Status: AC
  Administered 2019-06-06: 5 mg via INTRAVENOUS
  Filled 2019-06-06: qty 1

## 2019-06-06 MED ORDER — LISINOPRIL 20 MG PO TABS
20.0000 mg | ORAL_TABLET | Freq: Every day | ORAL | Status: DC
  Administered 2019-06-06 – 2019-06-07 (×2): 20 mg via ORAL
  Filled 2019-06-06 (×3): qty 1

## 2019-06-06 MED ORDER — LEVETIRACETAM 500 MG PO TABS
500.0000 mg | ORAL_TABLET | Freq: Two times a day (BID) | ORAL | Status: DC
  Administered 2019-06-07 – 2019-06-12 (×13): 500 mg via ORAL
  Filled 2019-06-06 (×15): qty 1

## 2019-06-06 NOTE — Progress Notes (Signed)
[facility] at [place] NAME: Adrian Valdez    MR#:  [id number]  DATE OF BIRTH:  05/02/1951  SUBJECTIVE:  Patient came in from [place] facility with witnessed generalized tonic clonic seizure which appears to be new. He has history of dementia and has been intermittently agitated and restless.  Sedated with ativan, Safety Sitter int he room patient still is getting intermittently Ativan. He tries to pull his Foley.  It's been difficult to manage him without a sitter in the room.  REVIEW OF SYSTEMS:   Review of Systems  Unable to perform ROS: Mental status change   DRUG ALLERGIES:   Allergies  Allergen Reactions  . Oxybutynin Other (See Comments)    Headache  . Myrbetriq [Mirabegron] Other (See Comments)    Other reaction(s): Other (See Comments) migraine Hallucinations      VITALS:  Blood pressure (!) 157/91, pulse 93, temperature 98.3 F (36.8 C), temperature source Oral, resp. rate 16, height 5\' 10"  (1.778 m), weight 87.6 kg, SpO2 100 %.  PHYSICAL EXAMINATION:  Limited exam due to agitation and dementia Physical Exam limited exam patient is sedated awakens on VC GENERAL:  68 y.o.-year-old patient lying in the bed with no acute distress. Appears chronically ill   HEENT: Head atraumatic, normocephalic. Oropharynx and nasopharynx clear.  NECK:  Supple, no jugular venous distention. No thyroid enlargement, no tenderness.  LUNGS: Normal breath sounds bilaterally, no wheezing, rales, rhonchi. No use of accessory muscles of respiration.  CARDIOVASCULAR: S1, S2 normal. No murmurs, rubs, or gallops.  ABDOMEN: Soft, nontender, nondistended. Bowel sounds present.FOLEY+ on 06/02/2019 EXTREMITIES: No cyanosis, clubbing or edema b/l.    NEUROLOGIC:moves all extremities well PSYCHIATRIC:  patient is sedated and opens eyes to VC SKIN: per RN documentation  LABORATORY PANEL:  CBC Recent Labs  Lab 05/31/19 0720  WBC 7.7  HGB 14.0  HCT 42.2   PLT 204    Chemistries  Recent Labs  Lab 06/03/19 0923  NA 138  K 3.5  CL 98  CO2 30  GLUCOSE 239*  BUN 22  CREATININE 1.06  CALCIUM 8.5*   Cardiac Enzymes No results for input(s): TROPONINI in the last 168 hours. RADIOLOGY:  No results found. ASSESSMENT AND PLAN:  Adrian Boender. is a 68 y.o. male with a history of advanced dementia, diabetes, hypertension, Waldenstrom macroglobulinemia, anemia admitted after unwitnessed mechanical fall.  Patient was found on the floor next to his bed.  Unclear how long patient was on the floor. Per EMS staff at the nursing facility witness generalized tonic clonic seizures.  1.Generalized tonic clonic seizure new onset, unclear etiology -CT had negative for stroke. Posterior parietal scalp hematoma present on the right -MRI brain right posterior parietal scalp hematoma -Neurology following.   -Continue  Keppra 500 mg BID -EEG did not show any new seizure -PRN Ativan -Seizure precaution  2.DM-2 -Blood sugar better controlled at this time--not on any meds at present. Cont SSI TID  3. Agitation - very agitated when not sedated per nursing. -Unfortunately pt's  Facility declined taking him back with level of care he needs now. They feel he needs rehab. I can't imagine this person able to do rehab when he can barely even wake up and when he does - so agitated. They've asked PT eval --recommends rehab -Psych input appreciated  -d/ced Risperdione -continue Seroquel 25 mg tid and prn Ativan ordered by psychiatry -overall poor prognosis  4. Unwitnessed fall with right posterior  parietal scalp hematoma -avoid antiplatelet agent. DC Lovenox  5. Hypokalemia repleted potassium  6. History of dementia, chronic -resumed chronic dementia meds  7. Urinary rentention ?etio - FOLEY placed (06/02/2019)  [facility] back. This will be very difficult disposition. PT recommends rehab.   CODE STATUS: DNR (based on dr shah's  d/w patient's HCPOA on 9/24)  DVT Prophylaxis: SCD  TOTAL TIME TAKING CARE OF THIS PATIENT: 30 minutes.  >50% time spent on counselling and coordination of care  POSSIBLE D/C IN  ? DAYS, DEPENDING ON CLINICAL CONDITION.  Note: This dictation was prepared with Dragon dictation along with smaller phrase technology. Any transcriptional errors that result from this process are unintentional.  Fritzi Mandes M.D on 06/06/2019 at 8:17 AM  Between 7am to 6pm - Pager - [phone]  After 6pm go to [email or web address] - password [email or web address] [facility]  Office  [phone]  CC: Primary care physician; Lawson Radar, NPPatient ID: Adrian Gibbon., male   DOB: 03-06-1951, 68 y.o.   MRN: [id number]

## 2019-06-06 NOTE — Progress Notes (Signed)
2300 Unable to keep pt in bed so brought out to nurses station in [place].  Pt restless and would not remain in recliner. Pt given stuffed animal, book, and something to drink but unable to calm or redirect pt. Pt attempted to kick this RN when she instructed pt to put his feet back on foot rest of recliner. Returned to bed at [place] and given po ativan for agitation, restlessness. Pt in low bed with bed alarm on and floor mats. Safety rounder monitoring pt. Dorna Bloom RN

## 2019-06-06 NOTE — Progress Notes (Addendum)
Pt verbally and physically aggressive towards staff.  Attempted to back hand NT, kick and hit at staff.  Unable to redirect pt.  Pt getting out of bed and almost fell but NT caught pt and put him back in bed.  Pt in low bed with bed alarm, floor mats, and mitts.  Gave pt prn po ativan.  Pt pulling at foley, pulling off gown, biting at pillow, restless and uncontrollable. Called [facility] and notified Charge nurse that staff is unable to redirect pt and he is at high risk for fall and injury without further assistance. Dorna Bloom RN

## 2019-06-06 NOTE — Progress Notes (Signed)
Pt remains awake, restless. Dorna Bloom RN

## 2019-06-06 NOTE — Progress Notes (Signed)
Patient ID: Adrian Valdez., male   DOB: 01-09-1951, 68 y.o.   MRN: [id number] informed by care manager that [facility] in [place] will will take him. Patient has to be sitter free. Spoke with Sgt. Pleas Patricia and will do safety rounds frequently. DC sitter. Foley will be removed after bladder training today.

## 2019-06-06 NOTE — TOC Progression Note (Signed)
Transition of Care (TOC) - Progression Note    Patient Details  Name: Adrian Valdez. MRN: [id number] Date of Birth: June 28, 1951  Transition of Care [facility]) CM/SW Contact  Shelbie Hutching, RN Phone Number: 06/06/2019, 12:17 PM  Clinical Narrative:    [facility] in [place] has agreed to accept patient in their locked memory care unit for skilled nursing.  Patient will need to be sitter free for 24 hours before being discharged to the [facility].  Patient's brother and Chauncey Reading, Shondell Bracci, agrees to patient going to the [facility] for rehab but would like for the patient to return to [facility] if possible after rehab.     Expected Discharge Plan: Memory Care(SNF with Memory Care) Barriers to Discharge: Other (comment)(needs to be sitter free for 24 hours before discharge)  Expected Discharge Plan and Services Expected Discharge Plan: Memory Care(SNF with Memory Care)   Discharge Planning Services: CM Consult Post Acute Care Choice: [place] arrangements for the past 2 months: [place] Expected Discharge Date: 05/31/19                                     Social Determinants of Health (SDOH) Interventions    Readmission Risk Interventions No flowsheet data found.

## 2019-06-07 ENCOUNTER — Inpatient Hospital Stay: Payer: Medicare Other

## 2019-06-07 LAB — CBC
HCT: 42.3 % (ref 39.0–52.0)
Hemoglobin: 14.4 g/dL (ref 13.0–17.0)
MCH: 29.9 pg (ref 26.0–34.0)
MCHC: 34 g/dL (ref 30.0–36.0)
MCV: 87.9 fL (ref 80.0–100.0)
Platelets: 283 10*3/uL (ref 150–400)
RBC: 4.81 MIL/uL (ref 4.22–5.81)
RDW: 12.2 % (ref 11.5–15.5)
WBC: 12.3 10*3/uL — ABNORMAL HIGH (ref 4.0–10.5)
nRBC: 0 % (ref 0.0–0.2)

## 2019-06-07 LAB — URINALYSIS, COMPLETE (UACMP) WITH MICROSCOPIC
RBC / HPF: >50 RBC/hpf — ABNORMAL HIGH (ref 0–5)
Specific Gravity, Urine: 1.025 (ref 1.005–1.030)
Squamous Epithelial / HPF: NONE SEEN (ref 0–5)
WBC, UA: >50 WBC/hpf — ABNORMAL HIGH (ref 0–5)

## 2019-06-07 LAB — BASIC METABOLIC PANEL
Anion gap: 10 (ref 5–15)
BUN: 21 mg/dL (ref 8–23)
CO2: 25 mmol/L (ref 22–32)
Calcium: 8.5 mg/dL — ABNORMAL LOW (ref 8.9–10.3)
Chloride: 97 mmol/L — ABNORMAL LOW (ref 98–111)
Creatinine, Ser: 0.9 mg/dL (ref 0.61–1.24)
GFR calc Af Amer: >60 mL/min (ref >60)
GFR calc non Af Amer: >60 mL/min (ref >60)
Glucose, Bld: 355 mg/dL — ABNORMAL HIGH (ref 70–99)
Potassium: 3.6 mmol/L (ref 3.5–5.1)
Sodium: 132 mmol/L — ABNORMAL LOW (ref 135–145)

## 2019-06-07 LAB — GLUCOSE, CAPILLARY
Glucose-Capillary: 161 mg/dL — ABNORMAL HIGH (ref 70–99)
Glucose-Capillary: 136 mg/dL — ABNORMAL HIGH (ref 70–99)
Glucose-Capillary: 121 mg/dL — ABNORMAL HIGH (ref 70–99)

## 2019-06-07 MED ORDER — QUETIAPINE FUMARATE 25 MG PO TABS: 100.0000 mg | ORAL_TABLET | Freq: Every day | ORAL | Status: DC

## 2019-06-07 MED ORDER — LORAZEPAM 2 MG/ML IJ SOLN: 1.0000 mg | Freq: Once | INTRAMUSCULAR | Status: DC

## 2019-06-07 MED ORDER — QUETIAPINE FUMARATE 25 MG PO TABS
50.0000 mg | ORAL_TABLET | Freq: Two times a day (BID) | ORAL | Status: DC
  Administered 2019-06-07 – 2019-06-08 (×3): 50 mg via ORAL
  Filled 2019-06-07 (×3): qty 2

## 2019-06-07 MED ORDER — QUETIAPINE FUMARATE 25 MG PO TABS
100.0000 mg | ORAL_TABLET | Freq: Every day | ORAL | Status: DC
  Administered 2019-06-07 – 2019-06-09 (×3): 100 mg via ORAL
  Filled 2019-06-07 (×3): qty 4

## 2019-06-07 MED ORDER — LORAZEPAM 1 MG PO TABS
1.0000 mg | ORAL_TABLET | Freq: Once | ORAL | Status: AC
  Administered 2019-06-07: 1 mg via ORAL
  Filled 2019-06-07: qty 1

## 2019-06-07 NOTE — Care Management Important Message (Signed)
Important Message  Patient Details  Name: Adrian Valdez. MRN: [id number] Date of Birth: Jan 13, 1951   Medicare Important Message Given:  Yes     [name] Howson 06/07/2019, 10:46 AM

## 2019-06-07 NOTE — Progress Notes (Signed)
[facility] [place] NAME: Adrian Valdez    MR#:  [id number]  DATE OF BIRTH:  Dec 31, 1950  SUBJECTIVE:  Patient came in from [place] facility with witnessed generalized tonic clonic seizure which appears to be new. He has history of dementia and has been intermittently agitated and restless.  Suited discontinued yesterday. Patient got agitated last night ended up getting IM Haldol. Currently, calm and composed. He removed.  REVIEW OF SYSTEMS:   Review of Systems  Unable to perform ROS: Mental status change   DRUG ALLERGIES:   Allergies  Allergen Reactions  . Oxybutynin Other (See Comments)    Headache  . Myrbetriq [Mirabegron] Other (See Comments)    Other reaction(s): Other (See Comments) migraine Hallucinations      VITALS:  Blood pressure 118/74, pulse 91, temperature 98.9 F (37.2 C), temperature source Oral, resp. rate 16, height 5\' 10"  (1.778 m), weight 87.6 kg, SpO2 100 %.  PHYSICAL EXAMINATION:  Limited exam due to agitation and dementia Physical Exam limited exam patient is sedated awakens on VC GENERAL:  68 y.o.-year-old patient lying in the bed with no acute distress. Appears chronically ill   HEENT: Head atraumatic, normocephalic. Oropharynx and nasopharynx clear.  NECK:  Supple, no jugular venous distention. No thyroid enlargement, no tenderness.  LUNGS: Normal breath sounds bilaterally, no wheezing, rales, rhonchi. No use of accessory muscles of respiration.  CARDIOVASCULAR: S1, S2 normal. No murmurs, rubs, or gallops.  ABDOMEN: Soft, nontender, nondistended. Bowel sounds present. EXTREMITIES: No cyanosis, clubbing or edema b/l.    NEUROLOGIC:moves all extremities well PSYCHIATRIC:  patient is sedated and opens eyes to VC SKIN: per RN documentation  LABORATORY PANEL:  CBC Recent Labs  Lab 06/07/19 0047  WBC 12.3*  HGB 14.4  HCT 42.3  PLT 283    Chemistries  Recent Labs  Lab 06/07/19 0047  NA 132*  K  3.6  CL 97*  CO2 25  GLUCOSE 355*  BUN 21  CREATININE 0.90  CALCIUM 8.5*   Cardiac Enzymes No results for input(s): TROPONINI in the last 168 hours. RADIOLOGY:  Dg Chest Port 1 View  Result Date: 06/07/2019 CLINICAL DATA:  68 year old male with fever. EXAM: PORTABLE CHEST 1 VIEW COMPARISON:  Chest radiograph 03/01/2019 and earlier. FINDINGS: Portable AP semi upright view at 0047 hours. Low but improved lung volumes since June. Normal cardiac size and mediastinal contours. Visualized tracheal air column is within normal limits. Linear atelectasis or scarring adjacent to the right hilum is stable. No other confluent pulmonary opacity. No pleural effusion or edema. Visible bowel-gas pattern within normal limits. No acute osseous abnormality identified. IMPRESSION: 1. Low but improved lung volumes since June. 2. Right perihilar scarring or atelectasis. No other acute cardiopulmonary abnormality. Electronically Signed   By: Genevie Ann M.D.   On: 06/07/2019 01:13   ASSESSMENT AND PLAN:  Adrian Barb. is a 68 y.o. male with a history of advanced dementia, diabetes, hypertension, Waldenstrom macroglobulinemia, anemia admitted after unwitnessed mechanical fall.  Patient was found on the floor next to his bed.  Unclear how long patient was on the floor. Per EMS staff at the nursing facility witness generalized tonic clonic seizures.  1.Generalized tonic clonic seizure new onset, unclear etiology -CT had negative for stroke. Posterior parietal scalp hematoma present on the right -MRI brain right posterior parietal scalp hematoma -Neurology following.   -Continue  Keppra 500 mg BID -EEG did not show any new seizure -Seizure precaution  2.DM-2 -Blood sugar better controlled at this time--not on any meds at present. Cont SSI TID  3. Agitation/combativness with progressive dementia  - very agitated when not sedated per nursing. -Unfortunately pt's discharge on hold to [place] center since patient  received IM Haldol last night. -Psych input appreciated with Dr. Claris Gower -continue Seroquel 50 mg bid and 100 mg qhs - prn Ativan has been d/c by psychiatry on oct 2nd  4. Unwitnessed fall with right posterior parietal scalp hematoma -avoid antiplatelet agent. DC Lovenox  5. Hypokalemia repleted potassium  6. History of dementia, chronic -resumed chronic dementia meds  7. Urinary rentention ?etio - FOLEY placed (06/02/2019)--d/ced on 0ct 2nd  Adrian Valdez declined to take him back.  [facility] will take patient back if patient is set her free for 24 hours and is not on any Haldol on Ativan. COVID test will need to be done prior to discharge once patient has a bed at [facility] center  D/w Adrian Valdez brother earlier  CODE STATUS: DNR (based on dr shah's d/w patient's HCPOA on 9/24)  DVT Prophylaxis: SCD  TOTAL TIME TAKING CARE OF THIS PATIENT: 30 minutes.  >50% time spent on counselling and coordination of care  POSSIBLE D/C IN  ? DAYS, DEPENDING ON CLINICAL CONDITION.  Note: This dictation was prepared with Dragon dictation along with smaller phrase technology. Any transcriptional errors that result from this process are unintentional.  Fritzi Mandes M.D on 06/07/2019 at 11:38 AM  Between 7am to 6pm - Pager - [phone]  After 6pm go to [email or web address] - password [email or web address] [facility]  Office  [phone]  CC: Primary care physician; Lawson Radar, NPPatient ID: Adrian Valdez., male   DOB: 06-02-1951, 68 y.o.   MRN: [id number]

## 2019-06-07 NOTE — Progress Notes (Signed)
Pt back in bed. Still restless but staying in bed.  Rechecked vitals and MEWS is down to 2.  Spoke with telemetry and QTC is .36. Will continue to monitor. Dorna Bloom RN

## 2019-06-07 NOTE — Progress Notes (Signed)
Pt just now fell asleep after being awake and restless all night. Dorna Bloom RN

## 2019-06-07 NOTE — Progress Notes (Addendum)
Pt has been restless and trying to get out of bed, pulling foley to point that it is bleeding from urethra and blood in urine.  Order received to give haldol and place heart monitor.  Since pt has had heart monitor on his MEWS has been 4-6.  Checked vitals and pt has temp 100.7 oral, HR 132, BP 162/95 RR 24.  Pt sitting in recliner at nurses station.  Unable to redirect.  Pt given ice cream, mints from family, and water.  Call out to hospitalist about MEWS of 6. Dorna Bloom RN Spoke with Dr Jannifer Franklin.  Order for 1 mg po ativan, drew UA off of foley, labs ordered. Dorna Bloom RN

## 2019-06-07 NOTE — Progress Notes (Signed)
Sister in law called and updated about pt's condition.  She requests that the physician call the brother [name] at [phone] as the family does not understand the plan of care for the patient.  Dorna Bloom RN

## 2019-06-07 NOTE — Progress Notes (Signed)
Patient ID: Adrian Valdez., male   DOB: Feb 06, 1951, 68 y.o.   MRN: [id number] Spoke with Kieren Gordin brother this morning and updated. Questions answered to my best ability. Brother informed if everything goes well pt will d/c to [facility] center today.

## 2019-06-07 NOTE — Progress Notes (Addendum)
Patient bladder scanned 352mL. Encouraged toileting, has not voided.  Patient refused sugar check, MD Willis aware.  Patient states he is tired, but still is restless. AC and charge nurse aware. Will continue to monitor safety.  Beola Cord, RN
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
## 2019-06-07 NOTE — TOC Progression Note (Addendum)
Transition of Care (TOC) - Progression Note    Patient Details  Name: Adrian Valdez. MRN: [id number] Date of Birth: 10-17-1950  Transition of Care [facility]) CM/SW [place], LCSW Phone Number: 06/07/2019, 9:46 AM  Clinical Narrative: Spoke with [facility] admissions coordinator. Patient's last COVID test was 9/21 so he will need a new one prior to discharge. Patient also received IV Haldol last night at 10:46 for restlessness so he will need to be 24 hours without that before he can admit. SNF admissions coordinator will let CSW know if they will have beds this weekend. MD is aware.    2:25 pm: [facility] admissions coordinator said they won't have a bed until Monday but want to continue to monitor how patient does over the weekend without sitter/chemical restraints. Sent MD a secure chat to notify.  3:04 pm: Received call from sister-in-law, Juliann Pulse. She wants to see if it is possible to find a SNF closer to home. Preferences are [facility], [place].  Expected Discharge Plan: Memory Care(SNF with Memory Care) Barriers to Discharge: Other (comment)(needs to be sitter free for 24 hours before discharge)  Expected Discharge Plan and Services Expected Discharge Plan: Memory Care(SNF with Memory Care)   Discharge Planning Services: CM Consult Post Acute Care Choice: [place] arrangements for the past 2 months: [place] Expected Discharge Date: 05/31/19                                     Social Determinants of Health (SDOH) Interventions    Readmission Risk Interventions No flowsheet data found.

## 2019-06-07 NOTE — Progress Notes (Signed)
PT Cancellation Note  Patient Details Name: Adrian Valdez. MRN: [id number] DOB: [birth year]   Cancelled Treatment:    Reason Eval/Treat Not Completed: Patient's level of consciousness  Pt laying in bed eyes closed, able to inconsistently mumble answers to PT's questions but overall was far too lethargic to be able to appropriately participate.    Kreg Shropshire, DPT 06/07/2019, 4:31 PM

## 2019-06-08 LAB — GLUCOSE, CAPILLARY
Glucose-Capillary: 136 mg/dL — ABNORMAL HIGH (ref 70–99)
Glucose-Capillary: 141 mg/dL — ABNORMAL HIGH (ref 70–99)
Glucose-Capillary: 143 mg/dL — ABNORMAL HIGH (ref 70–99)
Glucose-Capillary: 125 mg/dL — ABNORMAL HIGH (ref 70–99)

## 2019-06-08 MED ORDER — DUTASTERIDE 0.5 MG PO CAPS
0.5000 mg | ORAL_CAPSULE | Freq: Every day | ORAL | Status: DC
  Administered 2019-06-08 – 2019-06-14 (×7): 0.5 mg via ORAL
  Filled 2019-06-08 (×7): qty 1

## 2019-06-08 MED ORDER — HALOPERIDOL 1 MG PO TABS
1.0000 mg | ORAL_TABLET | Freq: Two times a day (BID) | ORAL | Status: DC
  Administered 2019-06-08 – 2019-06-09 (×3): 1 mg via ORAL
  Filled 2019-06-08 (×3): qty 1

## 2019-06-08 MED ORDER — TAMSULOSIN HCL 0.4 MG PO CAPS
0.8000 mg | ORAL_CAPSULE | Freq: Every day | ORAL | Status: DC
  Administered 2019-06-09 – 2019-06-14 (×6): 0.8 mg via ORAL
  Filled 2019-06-08 (×6): qty 2

## 2019-06-08 NOTE — Progress Notes (Signed)
Adrian Valdez NAME: Adrian Valdez    MR#:  [id number]  DATE OF BIRTH:  1951/05/20  SUBJECTIVE:  CHIEF COMPLAINT:   Chief Complaint  Patient presents with  . Fall   -Patient with significant dementia, sundowning, aggressive behavior.  Very restless in bed.  Sister-in-law at bedside. -RN mentioned urinary retention  REVIEW OF SYSTEMS:  Review of Systems  Unable to perform ROS: Mental status change    DRUG ALLERGIES:   Allergies  Allergen Reactions  . Oxybutynin Other (See Comments)    Headache  . Myrbetriq [Mirabegron] Other (See Comments)    Other reaction(s): Other (See Comments) migraine Hallucinations      VITALS:  Blood pressure (!) 97/54, pulse 86, temperature 98.7 F (37.1 C), temperature source Oral, resp. rate 17, height 5\' 10"  (1.778 m), weight 87.6 kg, SpO2 100 %.  PHYSICAL EXAMINATION:  Physical Exam   Limited physical exam given patient's acute delirium and confusion   GENERAL:  68 y.o.-year-old patient, very restless in bed.  Not alert EYES: Pupils equal, round, reactive to light and accommodation. No scleral icterus.  HEENT: Head atraumatic, normocephalic. Oropharynx and nasopharynx clear.  NECK:  Supple, no jugular venous distention. No thyroid enlargement, no tenderness.  LUNGS: Normal breath sounds bilaterally, no wheezing, rales,rhonchi or crepitation. No use of accessory muscles of respiration.  Decreased bibasilar breath sound CARDIOVASCULAR: S1, S2 normal. No murmurs, rubs, or gallops.  ABDOMEN: Soft, nontender, nondistended. Bowel sounds present. No organomegaly or mass.  EXTREMITIES: No pedal edema, cyanosis, or clubbing.  NEUROLOGIC: Moving all extremities in bed.  No obvious facial droop noted.Marland Kitchen  PSYCHIATRIC: The patient is disoriented.  Not alert SKIN: No obvious rash, lesion, or ulcer.    LABORATORY PANEL:   CBC Recent Labs  Lab 06/07/19 0047  WBC 12.3*  HGB 14.4  HCT 42.3  PLT  283   ------------------------------------------------------------------------------------------------------------------  Chemistries  Recent Labs  Lab 06/07/19 0047  NA 132*  K 3.6  CL 97*  CO2 25  GLUCOSE 355*  BUN 21  CREATININE 0.90  CALCIUM 8.5*   ------------------------------------------------------------------------------------------------------------------  Cardiac Enzymes No results for input(s): TROPONINI in the last 168 hours. ------------------------------------------------------------------------------------------------------------------  RADIOLOGY:  Dg Chest Port 1 View  Result Date: 06/07/2019 CLINICAL DATA:  68 year old male with fever. EXAM: PORTABLE CHEST 1 VIEW COMPARISON:  Chest radiograph 03/01/2019 and earlier. FINDINGS: Portable AP semi upright view at 0047 hours. Low but improved lung volumes since June. Normal cardiac size and mediastinal contours. Visualized tracheal air column is within normal limits. Linear atelectasis or scarring adjacent to the right hilum is stable. No other confluent pulmonary opacity. No pleural effusion or edema. Visible bowel-gas pattern within normal limits. No acute osseous abnormality identified. IMPRESSION: 1. Low but improved lung volumes since June. 2. Right perihilar scarring or atelectasis. No other acute cardiopulmonary abnormality. Electronically Signed   By: Genevie Ann M.D.   On: 06/07/2019 01:13    EKG:   Orders placed or performed during the hospital encounter of 05/27/19  . ED EKG  . ED EKG  . EKG 12-Lead  . EKG 12-Lead    ASSESSMENT AND PLAN:   Adrian Valdezis a 67 y.o.malewith a history of advanced dementia, diabetes, hypertension, Waldenstrommacroglobulinemia, anemia admitted after unwitnessed mechanical fall. Patient was found on the floor next to his bed. Unclear how long patient was on the floor. Per EMS staff at the nursing facility witness generalized tonic clonic seizures.  1.Generalized  tonic  clonic seizure new onset, unclear etiology -CT had negative for stroke. Posterior parietal scalp hematoma present on the right -MRI brain right posterior parietal scalp hematoma -Neurology following.   -Continue  Keppra 500 mg BID -EEG did not show any new seizure -Seizure precautions  2.DM-2 -Blood sugar better controlled at this time--not on any meds at present. Cont SSI TID  3. Agitation/combativness with progressive dementia  - very agitated when not sedated per nursing. -Unfortunately pt's discharge has been held given chemical restraints and having a sitter. -No sitter at this time.  Off of PRN Haldol and Ativan. -Appreciate psych input.  Seroquel has been increased. -Added Zyprexa PRN if needed.  4. Unwitnessed fall with right posterior parietal scalp hematoma -avoid antiplatelet agent. DC Lovenox  5.  Hypertension-patient on hydralazine and lisinopril  6. History of dementia, chronic -Patient on Namenda and Aricept  7. Urinary rentention-has known BPH.  Likely anticholinergic action from any of his psych meds might worsen this. -In and out for now.  If more than 3 times retaining more than 300 cc urine-place a Foley catheter.  However patient is at risk of pulling out the catheter that could cause hematuria. -Increase his Flomax and also added dutasteride  Adrian Valdez declined to take him back.  Not sure if a rehab facility will help him as patient is difficult to follow commands given his worsening dementia.  Might need long-term placement. -Family interested [facility].  Updated Education officer, museum. -Discussed with sister-in-law at bedside.     All the records are reviewed and case discussed with Care Management/Social Workerr. Management plans discussed with the patient, family and they are in agreement.  CODE STATUS: Full code  TOTAL TIME TAKING CARE OF THIS PATIENT: 39 minutes.   POSSIBLE D/C IN 1-2  DAYS, DEPENDING ON CLINICAL CONDITION.   Gladstone Lighter M.D on 06/08/2019 at 1:36 PM  Between 7am to 6pm - Pager - [phone]  After 6pm go to [email or web address] - Proofreader  [facility]  Office  [phone]  CC: Primary care physician; Adrian Radar, NP
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
## 2019-06-08 NOTE — Progress Notes (Signed)
Bladder scan- 379; in and out cath done with 330 dark yellow concentrated urine with 3 other staff assistance with pt resistive, striking out with hands/moving legs/pulling/pushing with arms.

## 2019-06-08 NOTE — Progress Notes (Signed)
Patient bladder scanned with 507 mL in bladder. Marcille Blanco MD notified. orders for in & out cath given. 355mL output. Patient still restless and combative.  Stacie Glaze, RN

## 2019-06-08 NOTE — Progress Notes (Signed)
Being fed dinner with pt eating well with pt keeping eyes closed. Calm/resting quietly at this time.

## 2019-06-08 NOTE — Progress Notes (Signed)
Pt slept at frequent intervals. Arouses to take crushed meds in applesauce. Pt's family in and active in feeding pt lunch. Remains confused/disoriented/resistive to care. Pt becomes agitated to touch and fights staff for bladder scans- no urinary leakage or voids. Avodart started. Staffed with MD regarding urinary retention history; pt pulled last foley out per nurse report; pt does not have safety sitter now; only safety rounder. Will continue to monitor bladder for retention.

## 2019-06-09 LAB — COMPREHENSIVE METABOLIC PANEL
ALT: 16 U/L (ref 0–44)
AST: 15 U/L (ref 15–41)
Albumin: 3.3 g/dL — ABNORMAL LOW (ref 3.5–5.0)
Alkaline Phosphatase: 62 U/L (ref 38–126)
Anion gap: 9 (ref 5–15)
BUN: 22 mg/dL (ref 8–23)
CO2: 28 mmol/L (ref 22–32)
Calcium: 8.6 mg/dL — ABNORMAL LOW (ref 8.9–10.3)
Chloride: 100 mmol/L (ref 98–111)
Creatinine, Ser: 0.89 mg/dL (ref 0.61–1.24)
GFR calc Af Amer: >60 mL/min (ref >60)
GFR calc non Af Amer: >60 mL/min (ref >60)
Glucose, Bld: 151 mg/dL — ABNORMAL HIGH (ref 70–99)
Potassium: 4.2 mmol/L (ref 3.5–5.1)
Sodium: 137 mmol/L (ref 135–145)
Total Bilirubin: 1 mg/dL (ref 0.3–1.2)
Total Protein: 6.1 g/dL — ABNORMAL LOW (ref 6.5–8.1)

## 2019-06-09 LAB — GLUCOSE, CAPILLARY
Glucose-Capillary: 118 mg/dL — ABNORMAL HIGH (ref 70–99)
Glucose-Capillary: 164 mg/dL — ABNORMAL HIGH (ref 70–99)
Glucose-Capillary: 144 mg/dL — ABNORMAL HIGH (ref 70–99)

## 2019-06-09 MED ORDER — HALOPERIDOL 2 MG PO TABS
2.0000 mg | ORAL_TABLET | Freq: Two times a day (BID) | ORAL | Status: DC
  Administered 2019-06-10 – 2019-06-14 (×8): 2 mg via ORAL
  Filled 2019-06-09 (×10): qty 1
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
## 2019-06-09 NOTE — Progress Notes (Addendum)
[facility] [facility] [place] NAME: Adrian Valdez    MR#:  [id number]  DATE OF BIRTH:  Jul 06, 1951  SUBJECTIVE:  CHIEF COMPLAINT:   Chief Complaint  Patient presents with  . Fall   -Alert this morning, still remains restless.  However less agitated relatively.  REVIEW OF SYSTEMS:  Review of Systems  Unable to perform ROS: Mental status change    DRUG ALLERGIES:   Allergies  Allergen Reactions  . Oxybutynin Other (See Comments)    Headache  . Myrbetriq [Mirabegron] Other (See Comments)    Other reaction(s): Other (See Comments) migraine Hallucinations      VITALS:  Blood pressure 125/80, pulse (!) 50, temperature 98.6 F (37 C), temperature source Oral, resp. rate 20, height 5\' 10"  (1.778 m), weight 87.6 kg, SpO2 96 %.  PHYSICAL EXAMINATION:  Physical Exam   Limited physical exam given patient's  delirium and confusion   GENERAL:  68 y.o.-year-old patient, very restless in bed.  Not alert EYES: Pupils equal, round, reactive to light and accommodation. No scleral icterus.  HEENT: Head atraumatic, normocephalic. Oropharynx and nasopharynx clear.  NECK:  Supple, no jugular venous distention. No thyroid enlargement, no tenderness.  LUNGS: Normal breath sounds bilaterally, no wheezing, rales,rhonchi or crepitation. No use of accessory muscles of respiration.  Decreased bibasilar breath sound CARDIOVASCULAR: S1, S2 normal. No murmurs, rubs, or gallops.  ABDOMEN: Soft, nontender, nondistended. Bowel sounds present. No organomegaly or mass.  EXTREMITIES: No pedal edema, cyanosis, or clubbing.  NEUROLOGIC: Moving all extremities in bed.  No obvious facial droop noted.Marland Kitchen  PSYCHIATRIC: The patient is disoriented.  Remains alert today SKIN: No obvious rash, lesion, or ulcer.    LABORATORY PANEL:   CBC Recent Labs  Lab 06/07/19 0047  WBC 12.3*  HGB 14.4  HCT 42.3  PLT 283    ------------------------------------------------------------------------------------------------------------------  Chemistries  Recent Labs  Lab 06/09/19 0545  NA 137  K 4.2  CL 100  CO2 28  GLUCOSE 151*  BUN 22  CREATININE 0.89  CALCIUM 8.6*  AST 15  ALT 16  ALKPHOS 62  BILITOT 1.0   ------------------------------------------------------------------------------------------------------------------  Cardiac Enzymes No results for input(s): TROPONINI in the last 168 hours. ------------------------------------------------------------------------------------------------------------------  RADIOLOGY:  No results found.  EKG:   Orders placed or performed during the hospital encounter of 05/27/19  . ED EKG  . ED EKG  . EKG 12-Lead  . EKG 12-Lead    ASSESSMENT AND PLAN:   Adrian Valdezis a 68 y.o.malewith a history of advanced dementia, diabetes, hypertension, Waldenstrommacroglobulinemia, anemia admitted after unwitnessed mechanical fall. Patient was found on the floor next to his bed. Unclear how long patient was on the floor. Per EMS staff at the nursing facility witness generalized tonic clonic seizures.  1.Generalized tonic clonic seizure new onset, unclear etiology -CT had negative for stroke. Posterior parietal scalp hematoma present on the right -MRI brain right posterior parietal scalp hematoma -Neurology following.   -Continue  Keppra 500 mg BID -EEG did not show any new seizure -Seizure precautions  2.DM-2 -Blood sugar better controlled at this time--not on any meds at present. Cont SSI TID  3. Agitation/combativness with progressive dementia  - very agitated when not sedated per nursing.  Appreciate psychiatry input. -Unfortunately pt's discharge has been held given chemical restraints and having a sitter. -No sitter at this time.    -Appreciate psych input.  Patient is started on Haldol twice daily.  Also on Seroquel which  is reduced to only  at bedtime.  Continue to monitor closely.  4. Unwitnessed fall with right posterior parietal scalp hematoma -avoid antiplatelet agent. DC Lovenox  5.  Hypertension-patient on hydralazine and lisinopril  6. History of dementia, chronic -Patient on Namenda and Aricept  7. Urinary rentention-has known BPH.  Likely anticholinergic action from any of his psych meds might worsen this. -In and out for now.  If more than 3 times retaining more than 300 cc urine-place a Foley catheter.  However patient is at risk of pulling out the catheter that could cause hematuria. -Increased his Flomax and also added dutasteride  Adrian Valdez declined to take him back.  Not sure if a rehab facility will help him as patient is difficult to follow commands given his worsening dementia.  Might need long-term placement. -Family interested in [facility].  Updated Education officer, museum.   Updated brother over the phone today-patient continues to decline without eating and drinking, will have to check with case manager if sending him back to [facility] with hospice services would be an option.  Family is okay with that.    All the records are reviewed and case discussed with Care Management/Social Workerr. Management plans discussed with the patient, family and they are in agreement.  CODE STATUS: Full code  TOTAL TIME TAKING CARE OF THIS PATIENT: 33 minutes.   POSSIBLE D/C IN 1-2 DAYS, DEPENDING ON CLINICAL CONDITION.   Gladstone Lighter M.D on 06/09/2019 at 12:35 PM  Between 7am to 6pm - Pager - [phone]  After 6pm go to [email or web address] - Proofreader  [facility]  Office  [phone]  CC: Primary care physician; Lawson Radar, NP

## 2019-06-09 NOTE — Progress Notes (Signed)
Patient has been calm and cooperative for the most part. He has not slept and been restless but minimal attempts to put his legs over and no attempts to get out of bed. This mornings bladder scan showed 676ml. Patient was I & O cath'd for a volume of 553ml. Patient tolerated procedure well. He required minimal effort to hold him while being cath'd. Urine was clear yellow.

## 2019-06-09 NOTE — Progress Notes (Signed)
Pt has had less frequent agitated episodes which is primarily response to pt care. Safety rounder changed to telesitter. No urinary leakage; bladder scan indicated urinary retention with In and Out cath done with 400 ml yellow concentrated urine released-flomax 0.8 mg and avodart given as ordered.. Pt less resistive to urine cath with 2 other staff members assisting. Pt still tries to put legs inbetween siderails but less frequently. Decreased appetite but pt takes oral supplements and crushed meds in applesauce.  Family updated.

## 2019-06-09 NOTE — Progress Notes (Signed)
Patient still agitated and very restless in the bed. Cooperative in taking crushed meds in applesauce. Highest reading on bladder scan after several attempts was 176. No bladder scan indicated at this time. No spontaneous void from patient. No bladder distention felt. Will continue to monitor retention and bladder scan as needed.

## 2019-06-10 DIAGNOSIS — Z515 Encounter for palliative care: Secondary | ICD-10-CM

## 2019-06-10 LAB — GLUCOSE, CAPILLARY
Glucose-Capillary: 125 mg/dL — ABNORMAL HIGH (ref 70–99)
Glucose-Capillary: 161 mg/dL — ABNORMAL HIGH (ref 70–99)
Glucose-Capillary: 216 mg/dL — ABNORMAL HIGH (ref 70–99)
Glucose-Capillary: 179 mg/dL — ABNORMAL HIGH (ref 70–99)

## 2019-06-10 MED ORDER — OLANZAPINE 10 MG IM SOLR
5.0000 mg | Freq: Once | INTRAMUSCULAR | Status: AC
  Administered 2019-06-10: 19:00:00 5 mg via INTRAMUSCULAR
  Filled 2019-06-10: qty 10

## 2019-06-10 MED ORDER — QUETIAPINE FUMARATE 25 MG PO TABS: 150.0000 mg | ORAL_TABLET | Freq: Every day | ORAL | Status: DC

## 2019-06-10 MED ORDER — QUETIAPINE FUMARATE 100 MG PO TABS
150.0000 mg | ORAL_TABLET | Freq: Every day | ORAL | Status: DC
  Administered 2019-06-10 – 2019-06-13 (×4): 150 mg via ORAL
  Filled 2019-06-10 (×2): qty 6
  Filled 2019-06-10 (×4): qty 1.5
  Filled 2019-06-10 (×2): qty 6
  Filled 2019-06-10: qty 1.5

## 2019-06-10 NOTE — Progress Notes (Signed)
Physical Therapy Treatment Patient Details Name: Adrian Valdez. MRN: [id number] DOB: 1951-08-03 Today's Date: 06/10/2019    History of Present Illness Per MD H&P: Pt is a 68 yo male with past medical history of hypertension, diabetes with neuropathy, dementia and Waldenstrm's gammaglobulinemia presents to the emergency department from his living facility after being found on the floor.  The patient was conscious and was alert and oriented.  Initial work-up in the emergency department was unremarkable and the patient was being prepared for discharge when he had a witnessed seizure.  Seizure was generalized tonic-clonic.  It lasted for approximately 30 seconds.  The patient did become hypoxic during the episode and required bag mask ventilation, but he did not lose pulse.  He was given 1 mg of Ativan and seizure activity stopped.  He has not had seizures before.  MD assessment 9/26 includes: Generalized tonic clonic seizure new onset, unclear etiology, Relative hypoglycemia, agitation, Unwitnessed fall with right posterior parietal scalp hematoma, hypokalemia, and h/o dementia.    PT Comments    Pt presented with deficits in strength, transfers, mobility, gait, balance, and activity tolerance but made good progress towards goals this session.  Pt presented with a flat affect but was calm with no agitation during the session.  Pt required extensive verbal and tactile cues for sequencing and to initiate movement during all of the following functional tasks but was cooperative throughout:  Pt required min-mod A with bed mobility and CGA with transfers.  Pt was able to amb 30' with a RW with min A to guide the RW and to initiate movement.  Pt presented with no adverse symptoms during amb with SpO2 and HR WNL throughout the session.  Pt primarily appears to be limited by cognition with mobility but would benefit from a trial of HHPT services to address the above deficits for the goal of decreased caregiver  assistance.      Follow Up Recommendations  Home health PT;Supervision/Assistance - 24 hour     Equipment Recommendations  Other (comment)(Unkown what pt has available at [facility], would benefit from RW if does not own one)    Recommendations for Other Services       Precautions / Restrictions Precautions Precautions: Fall Restrictions Weight Bearing Restrictions: No    Mobility  Bed Mobility Overal bed mobility: Needs Assistance Bed Mobility: Supine to Sit;Sit to Supine     Supine to sit: Min assist Sit to supine: Mod assist   General bed mobility comments: Pt required exensive verbal and tactile cues for sequencing  Transfers Overall transfer level: Needs assistance Equipment used: Rolling walker (2 wheeled) Transfers: Sit to/from Stand Sit to Stand: Min guard         General transfer comment: Max verbal and tactile cues to initiate movement but pt did not require physical assist to stand  Ambulation/Gait Ambulation/Gait assistance: Min assist Gait Distance (Feet): 30 Feet Assistive device: Rolling walker (2 wheeled) Gait Pattern/deviations: Step-through pattern;Trunk flexed;Decreased step length - left;Decreased step length - right;Shuffle Gait velocity: decreased   General Gait Details: Pt required min A to guide the RW to initiate movement but was able to amb 30' without signs of stress using a RW taking very slow, shuffling steps.   Stairs             Wheelchair Mobility    Modified Rankin (Stroke Patients Only)       Balance Overall balance assessment: Needs assistance Sitting-balance support: No upper extremity supported;Feet supported Sitting  balance-Leahy Scale: Good     Standing balance support: Bilateral upper extremity supported;During functional activity Standing balance-Leahy Scale: Fair Standing balance comment: No A for stability required in standing this session                            Cognition  Arousal/Alertness: Lethargic Behavior During Therapy: Flat affect Overall Cognitive Status: No family/caregiver present to determine baseline cognitive functioning                                        Exercises Total Joint Exercises Short Arc Quad: AROM;AAROM;Both;10 reps Heel Slides: AROM;AAROM;Both;5 reps;10 reps Hip ABduction/ADduction: AROM;AAROM;Both;5 reps;10 reps Straight Leg Raises: AROM;AAROM;Both;5 reps;10 reps Long Arc Quad: AAROM;AROM;Both;5 reps Knee Flexion: AROM;AAROM;Both;5 reps Marching in Standing: AROM;Both;5 reps    General Comments        Pertinent Vitals/Pain Pain Assessment: No/denies pain    Home Living                      Prior Function            PT Goals (current goals can now be found in the care plan section) Progress towards PT goals: Progressing toward goals    Frequency    Min 2X/week      PT Plan Discharge plan needs to be updated    Co-evaluation              AM-PAC PT "6 Clicks" Mobility   Outcome Measure  Help needed turning from your back to your side while in a flat bed without using bedrails?: A Lot Help needed moving from lying on your back to sitting on the side of a flat bed without using bedrails?: A Lot Help needed moving to and from a bed to a chair (including a wheelchair)?: A Little Help needed standing up from a chair using your arms (e.g., wheelchair or bedside chair)?: A Little Help needed to walk in hospital room?: A Little Help needed climbing 3-5 steps with a railing? : A Lot 6 Click Score: 15    End of Session Equipment Utilized During Treatment: Gait belt Activity Tolerance: Patient tolerated treatment well Patient left: in bed;with bed alarm set;with call bell/phone within reach;with nursing/sitter in room Nurse Communication: Mobility status PT Visit Diagnosis: Unsteadiness on feet (R26.81);Difficulty in walking, not elsewhere classified (R26.2);Muscle weakness  (generalized) (M62.81)     Time: [id number] PT Time Calculation (min) (ACUTE ONLY): 24 min  Charges:  $Gait Training: 8-22 mins $Therapeutic Exercise: 8-22 mins                     D. Scott Shivan Hodes PT, DPT 06/10/19, 5:00 PM
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
## 2019-06-10 NOTE — Plan of Care (Signed)

## 2019-06-10 NOTE — Progress Notes (Signed)
Nutrition Follow-up  DOCUMENTATION CODES:   Not applicable  INTERVENTION:  Continue Ensure Max Protein po BID, each supplement provides 150 kcal and 30 grams of protein.  Continue daily MVI.  NUTRITION DIAGNOSIS:   Inadequate oral intake related to decreased appetite as evidenced by per patient/family report.  Ongoing.  GOAL:   Patient will meet greater than or equal to 90% of their needs  Progressing.  MONITOR:   PO intake, Supplement acceptance, Labs, Weight trends, Skin  REASON FOR ASSESSMENT:   Malnutrition Screening Tool    ASSESSMENT:   68 year old male with PMHx of dementia, glaucoma, vitamin B12 deficiency, HTN, DM, neuropathy admitted with new-onset generalized tonic clonic seizure, also with agitation.  Patient's appetite remains decreased and intake is variable. He is eating 0-90% of meals but mainly 15-45%. Patient is very confused. He did eat half of his oatmeal and milk for breakfast. He is drinking his Ensure Max. Pending discharge plan.  Medications reviewed and include: Haldol 2 mg BID, Novolog 0-9 units TID, Novolog 0-5 units QHS, Keppra, MVI daily, Seroquel, senna-docusate 1 tablet BID, Flomax, vitamin B12 1000 micrograms daily.  Labs reviewed: CBG 118-164.  Diet Order:   Diet Order            Diet - low sodium heart healthy        Diet heart healthy/carb modified Room service appropriate? Yes; Fluid consistency: Thin  Diet effective now             EDUCATION NEEDS:   No education needs have been identified at this time  Skin:  Skin Assessment: Skin Integrity Issues:(stg I bilateral elbows)  Last BM:  06/09/2019 per chart  Height:   Ht Readings from Last 1 Encounters:  05/27/19 5\' 10"  (1.778 m)   Weight:   Wt Readings from Last 1 Encounters:  06/06/19 87.6 kg   Ideal Body Weight:  75.5 kg  BMI:  Body mass index is 27.71 kg/m.  Estimated Nutritional Needs:   Kcal:  2000-2200  Protein:  100-110 grams  Fluid:  2  L/day  Willey Blade, MS, RD, LDN Office: [phone] Pager: [phone] After Hours/Weekend Pager: [phone]

## 2019-06-10 NOTE — Care Management Important Message (Signed)
Important Message  Patient Details  Name: Adrian Valdez. MRN: [id number] Date of Birth: 09-Feb-1951   Medicare Important Message Given:  Yes     [name] Nyle Limb 06/10/2019, 11:08 AM

## 2019-06-10 NOTE — Progress Notes (Signed)
Daily Progress Note   Patient Name: Adrian Valdez.       Date: 06/10/2019 DOB: [birth year]  Age: 68 y.o. MRN#: [id number] Attending Physician: Gladstone Lighter, MD Primary Care Physician: Lawson Radar, NP Admit Date: 05/27/2019  Reason for Consultation/Follow-up: Psychosocial/spiritual support  Subjective: Patient is sitting in bed. He has eaten half of his oatmeal and half a container of milk. He is agitated complaining of "these people are very stupid", and trying to get out of bed. He is confused in his statements changing subjects frequently. Psychiatry following for agitation. Would recommend one of several options. Would recommend either increasing Seroquel dose, changing to Zyprexa, or changing to Risperdal. Per primary team notes, working on disposition. Discussion of rehab vs plans to see if [facility] will take patient back under hospice care.   Length of Stay: 14  Current Medications: Scheduled Meds:  . donepezil  10 mg Oral QHS  . dutasteride  0.5 mg Oral Daily  . haloperidol  2 mg Oral BID  . insulin aspart  0-5 Units Subcutaneous QHS  . insulin aspart  0-9 Units Subcutaneous TID WC  . latanoprost  1 drop Both Eyes QHS  . levETIRAcetam  500 mg Oral BID  . memantine  10 mg Oral BID  . multivitamin with minerals  1 tablet Oral Daily  . Ensure Max Protein  11 oz Oral BID BM  . QUEtiapine  100 mg Oral QHS  . senna-docusate  1 tablet Oral BID  . tamsulosin  0.8 mg Oral Daily  . timolol  1 drop Both Eyes BID  . [START ON 06/13/2019] vitamin B-12  1,000 mcg Oral Daily    Continuous Infusions:   PRN Meds: acetaminophen **OR** acetaminophen, hydrALAZINE, ondansetron **OR** ondansetron (ZOFRAN) IV  Physical Exam Pulmonary:     Effort: Pulmonary effort is normal.   Neurological:     Mental Status: He is alert.             Vital Signs: BP 127/88 (BP Location: Left Arm)   Pulse 76   Temp (!) 96.7 F (35.9 C) (Axillary)   Resp 17   Ht 5\' 10"  (1.778 m)   Wt 87.6 kg   SpO2 98%   BMI 27.71 kg/m  SpO2: SpO2: 98 % O2 Device: O2 Device: Room Air O2 Flow  Rate:    Intake/output summary:   Intake/Output Summary (Last 24 hours) at 06/10/2019 1051 Last data filed at 06/10/2019 0600 Gross per 24 hour  Intake 320 ml  Output 1250 ml  Net -930 ml   LBM: Last BM Date: 06/09/19 Baseline Weight: Weight: 83.1 kg Most recent weight: Weight: 87.6 kg       Palliative Assessment/Data:     Flowsheet Rows     Most Recent Value  Intake Tab  Referral Department  Hospitalist  Unit at Time of Referral  Med/Surg Unit  Date Notified  05/28/19  Palliative Care Type  New Palliative care  Reason for referral  Clarify Goals of Care  Date of Admission  05/27/19  Date first seen by Palliative Care  05/29/19  # of days Palliative referral response time  1 Day(s)  # of days IP prior to Palliative referral  1  Clinical Assessment  Psychosocial & Spiritual Assessment  Palliative Care Outcomes      Patient Active Problem List   Diagnosis Date Noted  . Pressure injury of skin 06/02/2019  . Palliative care by specialist   . Fall   . Hematoma of scalp   . Dementia with behavioral disturbance ([place])   . Seizure ([facility]) 05/27/2019  . Cellulitis 01/03/2019  . Necrosis of finger ([place]) 01/03/2019  . Cellulitis of hand 10/30/2018  . Goals of care, counseling/discussion 11/20/2016  . Encounter for antineoplastic chemotherapy 10/30/2016  . Altered mental status 09/22/2016  . Waldenstrom's macroglobulinemia ([place]) 09/01/2016  . Enlarged prostate with urinary obstruction 08/16/2016  . Prostate cancer screening 07/19/2016  . Right Non-Complex Renal Cyst 07/19/2016  . Dysuria 11/30/2015  . Urgency of urination 11/30/2015  . Nocturia 11/30/2015  . Ascending aorta  enlargement ([place]) 08/18/2015  . B12 deficiency 07/27/2015  . 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 Confusion state 07/07/2015  . BP (high blood pressure) 07/07/2015  . Major depressive disorder, single episode, moderate ([place]) 07/07/2015  . FOM (frequency of micturition) 07/07/2015    Palliative Care Assessment & Plan    Recommendations/Plan:  Recommend continuing to work with medications for agitation.  Recommend increasing Seroquel, changing to Zyprexa, or Risperdal.   Code Status:    Code Status Orders  (From admission, onward)         Start     Ordered   05/31/19 0853  Do not attempt resuscitation (DNR)  Continuous    Question Answer Comment  In the event of cardiac or respiratory ARREST Do not call a "code blue"   In the event of cardiac or respiratory ARREST Do not perform Intubation, CPR, defibrillation or ACLS   In the event of cardiac or respiratory ARREST Use medication by any route, position, wound care, and other measures to relive pain and suffering. May use oxygen, suction and manual treatment of airway obstruction as needed for comfort.      05/31/19 0852        Code Status History    Date Active Date Inactive Code Status Order ID Comments User Context   05/27/2019 0716 05/31/2019 0852 Full Code [id number]  Harrie Foreman, MD ED   01/04/2019 0115 01/04/2019 2026 Full Code [id number]  Leanora Cover, MD Inpatient   01/04/2019 0115 01/04/2019 0115 DNR [id number]  [place] Bing, DO Inpatient   10/30/2018 2053 11/02/2018 1531 Full Code [id number]  Henreitta Leber, MD Inpatient   Advance Care Planning Activity    Advance Directive Documentation     Most  Recent Value  Type of Advance Directive  Healthcare Power of Attorney, Living will  Pre-existing out of facility DNR order (yellow form or pink MOST form)  -  "MOST" Form in Place?  -       Prognosis:   < 6 months Poor PO intake.   Thank you for allowing the Palliative Medicine Team  to assist in the care of this patient.   Total Time 15 min Prolonged Time Billed no      Greater than 50%  of this time was spent counseling and coordinating care related to the above assessment and plan.  Asencion Gowda, NP  Please contact Palliative Medicine Team phone at [phone] for questions and concerns.

## 2019-06-10 NOTE — Progress Notes (Signed)
[place] [facility] NAME: Adrian Valdez    MR#:  [id number]  DATE OF BIRTH:  [birth year]  SUBJECTIVE:  CHIEF COMPLAINT:   Chief Complaint  Patient presents with  . Fall   -More alert today, had his lunch with assistance.  Updated sister-in-law at bedside -Complains of pain all over -Still requiring in and out catheterization  REVIEW OF SYSTEMS:  Review of Systems  Unable to perform ROS: Mental status change    DRUG ALLERGIES:   Allergies  Allergen Reactions  . Oxybutynin Other (See Comments)    Headache  . Myrbetriq [Mirabegron] Other (See Comments)    Other reaction(s): Other (See Comments) migraine Hallucinations      VITALS:  Blood pressure 127/88, pulse 76, temperature (!) 96.7 F (35.9 C), temperature source Axillary, resp. rate 17, height 5\' 10"  (1.778 m), weight 87.6 kg, SpO2 98 %.  PHYSICAL EXAMINATION:  Physical Exam    GENERAL:  68 y.o.-year-old patient, very restless in bed.  Not alert EYES: Pupils equal, round, reactive to light and accommodation. No scleral icterus.  HEENT: Head atraumatic, normocephalic. Oropharynx and nasopharynx clear.  NECK:  Supple, no jugular venous distention. No thyroid enlargement, no tenderness.  LUNGS: Normal breath sounds bilaterally, no wheezing, rales,rhonchi or crepitation. No use of accessory muscles of respiration.  Decreased bibasilar breath sound CARDIOVASCULAR: S1, S2 normal. No murmurs, rubs, or gallops.  ABDOMEN: Soft, nontender, nondistended. Bowel sounds present. No organomegaly or mass.  EXTREMITIES: No pedal edema, cyanosis, or clubbing.  NEUROLOGIC: Moving all extremities in bed.  No obvious facial droop noted.Marland Kitchen  PSYCHIATRIC: The patient is alert but disoriented.  Complains of pain all over SKIN: No obvious rash, lesion, or ulcer.    LABORATORY PANEL:   CBC Recent Labs  Lab 06/07/19 0047  WBC 12.3*  HGB 14.4  HCT 42.3  PLT 283    ------------------------------------------------------------------------------------------------------------------  Chemistries  Recent Labs  Lab 06/09/19 0545  NA 137  K 4.2  CL 100  CO2 28  GLUCOSE 151*  BUN 22  CREATININE 0.89  CALCIUM 8.6*  AST 15  ALT 16  ALKPHOS 62  BILITOT 1.0   ------------------------------------------------------------------------------------------------------------------  Cardiac Enzymes No results for input(s): TROPONINI in the last 168 hours. ------------------------------------------------------------------------------------------------------------------  RADIOLOGY:  No results found.  EKG:   Orders placed or performed during the hospital encounter of 05/27/19  . ED EKG  . ED EKG  . EKG 12-Lead  . EKG 12-Lead    ASSESSMENT AND PLAN:   Adrian Valdezis a 68 y.o.malewith a history of advanced dementia, diabetes, hypertension, Waldenstrommacroglobulinemia, anemia admitted after unwitnessed mechanical fall. Patient was found on the floor next to his bed. Unclear how long patient was on the floor. Per EMS staff at the nursing facility witness generalized tonic clonic seizures.  1.Generalized tonic clonic seizure new onset, unclear etiology -CT had negative for stroke. Posterior parietal scalp hematoma present on the right -MRI brain right posterior parietal scalp hematoma -Neurology following.   -Continue  Keppra 500 mg BID -EEG did not show any new seizure -Seizure precautions  2.DM-2 -Blood sugar better controlled at this time--not on any meds at present. Cont SSI TID  3. Agitation/combativness with progressive dementia  - very agitated when not sedated per nursing.  -Appreciate psych input.  Patient is started on Haldol twice daily.  Also on Seroquel which is reduced to only at bedtime.  Continue to monitor closely. -Appreciate palliative care input as well  4. Unwitnessed fall with right posterior parietal scalp  hematoma -avoid antiplatelet agent. DC Lovenox  5.  Hypertension-patient on hydralazine and lisinopril  6. History of dementia, chronic -Patient on Namenda and Aricept  7. Urinary rentention-has known BPH.  Likely anticholinergic action from any of his psych meds might worsen this. -In and out for now.   However patient is at risk of pulling out Foley catheter that could cause hematuria. -Increased his Flomax and also added dutasteride -Continue in and out catheterizations for now   Not sure if patient is able to participate in rehab.  Discharge possibly back to [facility] with hospice services.Marland Kitchen Updated sister-in-law today    All the records are reviewed and case discussed with Care Management/Social Workerr. Management plans discussed with the patient, family and they are in agreement.  CODE STATUS: Full code  TOTAL TIME TAKING CARE OF THIS PATIENT: 39 minutes.   POSSIBLE D/C IN 1-2 DAYS, DEPENDING ON CLINICAL CONDITION.   Gladstone Lighter M.D on 06/10/2019 at 2:05 PM  Between 7am to 6pm - Pager - [phone]  After 6pm go to [email or web address] - Proofreader  [facility]  Office  [phone]  CC: Primary care physician; Lawson Radar, NP

## 2019-06-10 NOTE — Progress Notes (Signed)
Patient has not slept this shift and has been more irritable. Patient attempting to get out of bed numerous times. Patient not redirectable by tele sitter. Tele sitter having to call RN and NT for assistance with patient on multiple occasions. Bed exit alarm on for back up and patient remains in low bed. Patient requiring frequent rounds by RN and NT.   Patient had to be in and out cath'd twice this shift. He has had no spontaneous void. Patient tolerated both in and out cath procedures with no resistance or complication. After last in and out cath there was a small amount of pink tinged urine noted when catheter was being removed. Patient slightly irritable when being cath'd but was not thrashing or moving much during the cath procedure either time.   1st cath- 400 (bladder scan >414) 2230  2nd cath 450 (bladder scan 523) 0600

## 2019-06-10 NOTE — TOC Progression Note (Signed)
Transition of Care (TOC) - Progression Note    Patient Details  Name: Adrian Valdez. MRN: [id number] Date of Birth: Jan 29, 1951  Transition of Care [facility]) CM/SW Contact  Shelbie Hutching, RN Phone Number: 06/10/2019, 11:51 AM  Clinical Narrative:     Patient's sister in law is currently sitting at the bedside with patient and reports that he looks so much better and is pretty much back to baseline.  The family is requesting that the patient return to [facility] if possible.  RNCM reached out to [facility] and spoke with Ed- [facility] is still concerned about the level of care the patient may need, they are happy to come and reassess the patient but they think he would benefit from SNF for a short period of time.   Patient has been without a sitter and without IV medications for over 24 hours.    Expected Discharge Plan: Memory Care(SNF with Memory Care) Barriers to Discharge: Other (comment)(needs to be sitter free for 24 hours before discharge)  Expected Discharge Plan and Services Expected Discharge Plan: Memory Care(SNF with Memory Care)   Discharge Planning Services: CM Consult Post Acute Care Choice: [place] arrangements for the past 2 months: [place] Expected Discharge Date: 05/31/19                                     Social Determinants of Health (SDOH) Interventions    Readmission Risk Interventions No flowsheet data found.

## 2019-06-10 NOTE — TOC Progression Note (Signed)
Transition of Care (TOC) - Progression Note    Patient Details  Name: Adrian Valdez. MRN: [id number] Date of Birth: 01/03/1951  Transition of Care [facility]) CM/SW Contact  Shelbie Hutching, RN Phone Number: 06/10/2019, 3:23 PM  Clinical Narrative:    RNCM is waiting to hear back from PT about how patient did today working with them.  MD does not think that patient would benefit from SNF and really needs to be followed by hospice.  RNCM will discuss returning to [facility] with Hospice with appropriate staff.     Expected Discharge Plan: Memory Care(SNF with Memory Care) Barriers to Discharge: Other (comment)(needs to be sitter free for 24 hours before discharge)  Expected Discharge Plan and Services Expected Discharge Plan: Memory Care(SNF with Memory Care)   Discharge Planning Services: CM Consult Post Acute Care Choice: [place] arrangements for the past 2 months: [place] Expected Discharge Date: 05/31/19                                     Social Determinants of Health (SDOH) Interventions    Readmission Risk Interventions No flowsheet data found.

## 2019-06-11 LAB — GLUCOSE, CAPILLARY
Glucose-Capillary: 165 mg/dL — ABNORMAL HIGH (ref 70–99)
Glucose-Capillary: 225 mg/dL — ABNORMAL HIGH (ref 70–99)
Glucose-Capillary: 242 mg/dL — ABNORMAL HIGH (ref 70–99)
Glucose-Capillary: 119 mg/dL — ABNORMAL HIGH (ref 70–99)

## 2019-06-11 NOTE — Plan of Care (Signed)

## 2019-06-11 NOTE — Progress Notes (Signed)
[place] [facility] [place] NAME: Adrian Valdez    MR#:  [id number]  DATE OF BIRTH:  May 04, 1951  SUBJECTIVE:  CHIEF COMPLAINT:   Chief Complaint  Patient presents with  . Fall    - sleeping this morning, was more alert and disoriented yesterday but worked with PT  REVIEW OF SYSTEMS:  Review of Systems  Unable to perform ROS: Mental status change    DRUG ALLERGIES:   Allergies  Allergen Reactions  . Oxybutynin Other (See Comments)    Headache  . Myrbetriq [Mirabegron] Other (See Comments)    Other reaction(s): Other (See Comments) migraine Hallucinations      VITALS:  Blood pressure (!) 146/66, pulse 84, temperature 98.6 F (37 C), temperature source Axillary, resp. rate 17, height 5\' 10"  (1.778 m), weight 87.6 kg, SpO2 97 %.  PHYSICAL EXAMINATION:  Physical Exam    GENERAL:  68 y.o.-year-old patient, resting in bed.  Not alert EYES: Pupils equal, round, reactive to light and accommodation. No scleral icterus.  HEENT: Head atraumatic, normocephalic. Oropharynx and nasopharynx clear.  NECK:  Supple, no jugular venous distention. No thyroid enlargement, no tenderness.  LUNGS: Normal breath sounds bilaterally, no wheezing, rales,rhonchi or crepitation. No use of accessory muscles of respiration.  Decreased bibasilar breath sound CARDIOVASCULAR: S1, S2 normal. No murmurs, rubs, or gallops.  ABDOMEN: Soft, nontender, nondistended. Bowel sounds present. No organomegaly or mass.  EXTREMITIES: No pedal edema, cyanosis, or clubbing.  NEUROLOGIC: Moving all extremities in bed.  No obvious facial droop noted.Marland Kitchen  PSYCHIATRIC: The patient is sleeping.    SKIN: No obvious rash, lesion, or ulcer.    LABORATORY PANEL:   CBC Recent Labs  Lab 06/07/19 0047  WBC 12.3*  HGB 14.4  HCT 42.3  PLT 283   ------------------------------------------------------------------------------------------------------------------  Chemistries  Recent Labs   Lab 06/09/19 0545  NA 137  K 4.2  CL 100  CO2 28  GLUCOSE 151*  BUN 22  CREATININE 0.89  CALCIUM 8.6*  AST 15  ALT 16  ALKPHOS 62  BILITOT 1.0   ------------------------------------------------------------------------------------------------------------------  Cardiac Enzymes No results for input(s): TROPONINI in the last 168 hours. ------------------------------------------------------------------------------------------------------------------  RADIOLOGY:  No results found.  EKG:   Orders placed or performed during the hospital encounter of 05/27/19  . ED EKG  . ED EKG  . EKG 12-Lead  . EKG 12-Lead    ASSESSMENT AND PLAN:   Adrian Valdezis a 68 y.o.malewith a history of advanced dementia, diabetes, hypertension, Waldenstrommacroglobulinemia, anemia admitted after unwitnessed mechanical fall. Patient was found on the floor next to his bed. Unclear how long patient was on the floor. Per EMS staff at the nursing facility witness generalized tonic clonic seizures.  1.Generalized tonic clonic seizure new onset, unclear etiology -CT had negative for stroke. Posterior parietal scalp hematoma present on the right -MRI brain right posterior parietal scalp hematoma -Neurology following.   -Continue  Keppra 500 mg BID -EEG did not show any new seizure -Seizure precautions  2.DM-2 -Blood sugar better controlled at this time--not on any meds at present. Cont SSI TID  3. Agitation/combativness with progressive dementia  - very agitated when not sedated per nursing.  -Appreciate psych input.  Patient is started on Haldol twice daily.  Also on Seroquel at bedtime. With improvement in agitation lately. - Continue to monitor closely. -Appreciate palliative care input as well  4. Unwitnessed fall with right posterior parietal scalp hematoma -avoid antiplatelet agent. DC Lovenox  5.  Hypertension-patient on hydralazine and lisinopril  6. History of dementia,  chronic -Patient on Namenda and Aricept  7. Urinary rentention-has known BPH.   -In and out for now.   patient is at risk of pulling out Foley catheter that could cause hematuria. -Increased his Flomax and also added dutasteride -Continue in and out catheterizations for now   Discharge possibly back to [facility] with hospice services.. Social worker notified today Updated brother today    All the records are reviewed and case discussed with Care Management/Social Workerr. Management plans discussed with the patient, family and they are in agreement.  CODE STATUS: Full code  TOTAL TIME TAKING CARE OF THIS PATIENT: 39 minutes.   POSSIBLE D/C IN 1-2 DAYS, DEPENDING ON CLINICAL CONDITION.   Gladstone Lighter M.D on 06/11/2019 at 9:44 AM  Between 7am to 6pm - Pager - [phone]  After 6pm go to [email or web address] - Proofreader  [facility]  Office  [phone]  CC: Primary care physician; Lawson Radar, NP
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
## 2019-06-12 LAB — GLUCOSE, CAPILLARY
Glucose-Capillary: 191 mg/dL — ABNORMAL HIGH (ref 70–99)
Glucose-Capillary: 246 mg/dL — ABNORMAL HIGH (ref 70–99)
Glucose-Capillary: 259 mg/dL — ABNORMAL HIGH (ref 70–99)
Glucose-Capillary: 220 mg/dL — ABNORMAL HIGH (ref 70–99)

## 2019-06-12 MED ORDER — CHLORHEXIDINE GLUCONATE CLOTH 2 % EX PADS
6.0000 | MEDICATED_PAD | Freq: Every day | CUTANEOUS | Status: DC
  Administered 2019-06-13: 6 via TOPICAL

## 2019-06-12 NOTE — Progress Notes (Signed)
Physical Therapy Treatment Patient Details Name: Adrian Valdez. MRN: [id number] DOB: [birth year] Today's Date: 06/12/2019    History of Present Illness Per MD H&P: Pt is a 68 yo male with past medical history of hypertension, diabetes with neuropathy, dementia and Waldenstrm's gammaglobulinemia presents to the emergency department from his living facility after being found on the floor.  The patient was conscious and was alert and oriented.  Initial work-up in the emergency department was unremarkable and the patient was being prepared for discharge when he had a witnessed seizure.  Seizure was generalized tonic-clonic.  It lasted for approximately 30 seconds.  The patient did become hypoxic during the episode and required bag mask ventilation, but he did not lose pulse.  He was given 1 mg of Ativan and seizure activity stopped.  He has not had seizures before.  MD assessment 9/26 includes: Generalized tonic clonic seizure new onset, unclear etiology, Relative hypoglycemia, agitation, Unwitnessed fall with right posterior parietal scalp hematoma, hypokalemia, and h/o dementia.    PT Comments    Pt presented with deficits in strength, transfers, mobility, gait, and balance. Pt was lethargic and required repeated cuing throughout. Pt completed bed exercises as AAROM. Pt required min A to come to sit on EOB, but +2 mod A to return to supine from sitting due to difficulty in sequencing movement. Pt required +2 mod A to complete sit to stand, and tactile cuing to sit from standing. Pt required min A with ambulation to push RW ahead and encourage progression, with CGA for safety. Pt will benefit from HHPT services and 24-hour supervision/asst upon discharge to safely address above deficits for decreased caregiver assistance and eventual return to PLOF.    Follow Up Recommendations  Home health PT;Supervision/Assistance - 24 hour     Equipment Recommendations  Other (comment)(Unkown what pt has  available at [facility], would benefit from RW if does not own one)    Recommendations for Other Services       Precautions / Restrictions Precautions Precautions: Fall Precaution Comments: Seizure precautions Restrictions Weight Bearing Restrictions: No    Mobility  Bed Mobility Overal bed mobility: Needs Assistance Bed Mobility: Supine to Sit;Sit to Supine     Supine to sit: Min assist Sit to supine: Mod assist;+2 for physical assistance   General bed mobility comments: Pt required exensive verbal and tactile cues for sequencing to come to EOB, required +2 mod A to return to supine  Transfers Overall transfer level: Needs assistance Equipment used: Rolling walker (2 wheeled) Transfers: Sit to/from Stand Sit to Stand: Mod assist;+2 physical assistance         General transfer comment: Max verbal and tactile cues to initiate movement and pt required +2 mod A to stand  Ambulation/Gait Ambulation/Gait assistance: Min assist(To push walker in front of pt) Gait Distance (Feet): 30 Feet Assistive device: Rolling walker (2 wheeled) Gait Pattern/deviations: Step-through pattern;Trunk flexed;Decreased step length - left;Decreased step length - right;Shuffle Gait velocity: Deceased   General Gait Details: Pt required min A to guide the RW to initiate and sustain movement but was able to amb 30' without signs of stress using a RW taking very slow, shuffling steps.   Stairs             Wheelchair Mobility    Modified Rankin (Stroke Patients Only)       Balance Overall balance assessment: Needs assistance Sitting-balance support: No upper extremity supported;Feet supported Sitting balance-Leahy Scale: Good Sitting balance - Comments: Good  static sitting on EOB   Standing balance support: Bilateral upper extremity supported;During functional activity Standing balance-Leahy Scale: Fair Standing balance comment: Close CGA maintained for safety while standing.                             Cognition Arousal/Alertness: Lethargic Behavior During Therapy: Flat affect Overall Cognitive Status: Difficult to assess                                 General Comments: Minimal ability to follow 1 step commands with repeated verbal and tactile cues      Exercises Total Joint Exercises Heel Slides: AROM;AAROM;Both;5 reps Hip ABduction/ADduction: AROM;AAROM;Both;5 reps;10 reps Long Arc Quad: AAROM;AROM;Both;5 reps Marching in Standing: AROM;Standing;Both;5 reps Other Exercises Other Exercises: Pt in static seating with min dynamic balance purturbations, and pt in static standing with CGA for safety, each for several minutes.    General Comments        Pertinent Vitals/Pain Pain Assessment: No/denies pain    Home Living                      Prior Function            PT Goals (current goals can now be found in the care plan section) Progress towards PT goals: Not progressing toward goals - comment(Pt functional mobility and cognitive ability were somewhat decreased since previous session.)    Frequency    Min 2X/week      PT Plan Current plan remains appropriate    Co-evaluation              AM-PAC PT "6 Clicks" Mobility   Outcome Measure  Help needed turning from your back to your side while in a flat bed without using bedrails?: A Little Help needed moving from lying on your back to sitting on the side of a flat bed without using bedrails?: A Lot Help needed moving to and from a bed to a chair (including a wheelchair)?: A Lot Help needed standing up from a chair using your arms (e.g., wheelchair or bedside chair)?: A Lot Help needed to walk in hospital room?: A Little Help needed climbing 3-5 steps with a railing? : A Lot 6 Click Score: 14    End of Session Equipment Utilized During Treatment: Gait belt Activity Tolerance: Patient tolerated treatment well Patient left: in bed;with bed alarm  set;with call bell/phone within reach;with nursing/sitter in room(With bilat safety mittens donned.) Nurse Communication: Mobility status PT Visit Diagnosis: Unsteadiness on feet (R26.81);Difficulty in walking, not elsewhere classified (R26.2);Muscle weakness (generalized) (M62.81)     Time: [id number] PT Time Calculation (min) (ACUTE ONLY): 24 min  Charges:                        Juanda Crumble "Gus" Neftaly Swiss, SPT  06/12/19, 4:09 PM

## 2019-06-12 NOTE — TOC Progression Note (Signed)
Transition of Care (TOC) - Progression Note    Patient Details  Name: Adrian Valdez. MRN: [id number] Date of Birth: 1951-04-12  Transition of Care [facility]) CM/SW Contact  Shelbie Hutching, RN Phone Number: 06/12/2019, 1:58 PM  Clinical Narrative:    Vaughan Basta from [facility] was able to come and evaluate patient but unfortunately the patient was sleeping soundly and did not want to get up and out of the bed.  [facility] cannot accept patient back to [facility] until she can verify that the patient can walk and feed himself.  Patient can also not return with the foley catheter. Patient's brother [name] has been updated.  [name] reports that he wants his brother to go back to [facility] with Hospice care.     Expected Discharge Plan: Memory Care(SNF with Memory Care) Barriers to Discharge: Other (comment)(needs to be sitter free for 24 hours before discharge)  Expected Discharge Plan and Services Expected Discharge Plan: Memory Care(SNF with Memory Care)   Discharge Planning Services: CM Consult Post Acute Care Choice: [facility] arrangements for the past 2 months: [facility] Expected Discharge Date: 05/31/19                                     Social Determinants of Health (SDOH) Interventions    Readmission Risk Interventions No flowsheet data found.

## 2019-06-12 NOTE — Plan of Care (Signed)
PMT note: Patient is resting in bed. He has a tech at bedside feeding him. He is cooperative. He does not answer my questions. No obvious distress noted. No changes to regimen recommended.   No charge note.

## 2019-06-12 NOTE — Care Management Important Message (Signed)
Important Message  Patient Details  Name: Adrian Valdez. MRN: [id number] Date of Birth: May 04, 1951   Medicare Important Message Given:  Yes     [name] [name] 06/12/2019, 10:16 AM

## 2019-06-12 NOTE — Progress Notes (Addendum)
[facility] [place] NAME: Adrian Valdez    MR#:  [id number]  DATE OF BIRTH:  [birth year]  SUBJECTIVE:  CHIEF COMPLAINT:   Chief Complaint  Patient presents with  . Fall    -Sleeping this morning.  Foley catheter was placed and last night for urinary retention after several needs for I&O  REVIEW OF SYSTEMS:  Review of Systems  Unable to perform ROS: Mental status change    DRUG ALLERGIES:   Allergies  Allergen Reactions  . Oxybutynin Other (See Comments)    Headache  . Myrbetriq [Mirabegron] Other (See Comments)    Other reaction(s): Other (See Comments) migraine Hallucinations      VITALS:  Blood pressure 118/77, pulse 86, temperature 98.4 F (36.9 C), temperature source Oral, resp. rate 20, height 5\' 10"  (1.778 m), weight 87.6 kg, SpO2 98 %.  PHYSICAL EXAMINATION:  Physical Exam    GENERAL:  68 y.o.-year-old patient, resting in bed.  Not alert EYES: Pupils equal, round, reactive to light and accommodation. No scleral icterus.  HEENT: Head atraumatic, normocephalic. Oropharynx and nasopharynx clear.  NECK:  Supple, no jugular venous distention. No thyroid enlargement, no tenderness.  LUNGS: Normal breath sounds bilaterally, no wheezing, rales,rhonchi or crepitation. No use of accessory muscles of respiration.  Decreased bibasilar breath sound CARDIOVASCULAR: S1, S2 normal. No murmurs, rubs, or gallops.  ABDOMEN: Soft, nontender, nondistended. Bowel sounds present. No organomegaly or mass.  EXTREMITIES: No pedal edema, cyanosis, or clubbing.  NEUROLOGIC: Moving all extremities in bed.  No obvious facial droop noted.Marland Kitchen  PSYCHIATRIC: The patient is sleeping.    SKIN: No obvious rash, lesion, or ulcer.    LABORATORY PANEL:   CBC Recent Labs  Lab 06/07/19 0047  WBC 12.3*  HGB 14.4  HCT 42.3  PLT 283   ------------------------------------------------------------------------------------------------------------------   Chemistries  Recent Labs  Lab 06/09/19 0545  NA 137  K 4.2  CL 100  CO2 28  GLUCOSE 151*  BUN 22  CREATININE 0.89  CALCIUM 8.6*  AST 15  ALT 16  ALKPHOS 62  BILITOT 1.0   ------------------------------------------------------------------------------------------------------------------  Cardiac Enzymes No results for input(s): TROPONINI in the last 168 hours. ------------------------------------------------------------------------------------------------------------------  RADIOLOGY:  No results found.  EKG:   Orders placed or performed during the [facility] encounter of 05/27/19  . ED EKG  . ED EKG  . EKG 12-Lead  . EKG 12-Lead    ASSESSMENT AND PLAN:   JERIMIA VANLANDINGHAM Valdezis a 68 y.o.malewith a history of advanced dementia, diabetes, hypertension, Waldenstrommacroglobulinemia, anemia admitted after unwitnessed mechanical fall. Patient was found on the floor next to his bed. Unclear how long patient was on the floor. Per EMS staff at the nursing facility witness generalized tonic clonic seizures.  1.Generalized tonic clonic seizure new onset, unclear etiology -CT had negative for stroke. Posterior parietal scalp hematoma present on the right -MRI brain right posterior parietal scalp hematoma -Neurology following.   -Continue  Keppra 500 mg BID -EEG did not show any new seizure -Seizure precautions  2.DM-2 -Blood sugar better controlled at this time--not on any meds at present. Cont SSI TID  3. Agitation/combativness with progressive dementia  - very agitated when not sedated per nursing.  -Appreciate psych input.  Patient is started on Haldol twice daily.  Also on Seroquel at bedtime. With improvement in agitation lately. - Continue to monitor closely. -Appreciate palliative care input as well  4. Unwitnessed fall with right posterior parietal scalp hematoma -avoid  antiplatelet agent. DC Lovenox  5.  Hypertension-patient on hydralazine and lisinopril   6. History of dementia, chronic -Patient on Namenda and Aricept  7. Urinary rentention-has known BPH.   -Received 3 times daily in and out, we were concerned about placing Foley catheter as patient might risk pulling it out causing hematuria and urethral trauma.  However he is more calmer now with the medications.  Foley was placed and last night. --Increased his Flomax and also added dutasteride   Discharge possibly back to [facility] with hospice services.. Social worker notified today Updated brother yesterday    All the records are reviewed and case discussed with Care Management/Social Workerr. Management plans discussed with the patient, family and they are in agreement.  CODE STATUS: DNR  TOTAL TIME TAKING CARE OF THIS PATIENT: 33 minutes.   POSSIBLE D/C IN 1-2 DAYS, DEPENDING ON CLINICAL CONDITION.   Gladstone Lighter M.D on 06/12/2019 at 10:41 AM  Between 7am to 6pm - Pager - [phone]  After 6pm go to [email or web address] - Proofreader  [facility]  Office  [phone]  CC: Primary care physician; Lawson Radar, NP

## 2019-06-12 NOTE — Progress Notes (Signed)
Pt urinary retention of 667ml on bladder scan. Attempted to I&O cath pt with 4fr and unable to reach bladder. Used 52fr cath and able to get @700ml  out. Pt tolerated well. Dr Jannifer Franklin notified. New order to leave foley cath in at this time due to pt I&O x 3 today. Orders acknowledged. Pdowless, RN

## 2019-06-13 ENCOUNTER — Inpatient Hospital Stay: Payer: Medicare Other

## 2019-06-13 ENCOUNTER — Telehealth: Payer: Self-pay | Admitting: Urology

## 2019-06-13 LAB — GLUCOSE, CAPILLARY
Glucose-Capillary: 189 mg/dL — ABNORMAL HIGH (ref 70–99)
Glucose-Capillary: 244 mg/dL — ABNORMAL HIGH (ref 70–99)
Glucose-Capillary: 158 mg/dL — ABNORMAL HIGH (ref 70–99)
Glucose-Capillary: 231 mg/dL — ABNORMAL HIGH (ref 70–99)

## 2019-06-13 LAB — URINALYSIS, COMPLETE (UACMP) WITH MICROSCOPIC
Bilirubin Urine: NEGATIVE
Glucose, UA: NEGATIVE mg/dL
Hgb urine dipstick: NEGATIVE
Ketones, ur: NEGATIVE mg/dL
Nitrite: NEGATIVE
Protein, ur: 100 mg/dL — AB
Specific Gravity, Urine: 1.024 (ref 1.005–1.030)
WBC, UA: >50 WBC/hpf — ABNORMAL HIGH (ref 0–5)
pH: 5 (ref 5.0–8.0)

## 2019-06-13 MED ORDER — ADULT MULTIVITAMIN LIQUID CH
15.0000 mL | Freq: Every day | ORAL | Status: DC
  Administered 2019-06-14: 15 mL via ORAL
  Filled 2019-06-13 (×2): qty 15

## 2019-06-13 MED ORDER — CEPHALEXIN 250 MG PO CAPS
250.0000 mg | ORAL_CAPSULE | Freq: Four times a day (QID) | ORAL | Status: DC
  Administered 2019-06-13 – 2019-06-14 (×4): 250 mg via ORAL
  Filled 2019-06-13 (×7): qty 1

## 2019-06-13 MED ORDER — SODIUM CHLORIDE 0.9 % IV SOLN
1.0000 g | INTRAVENOUS | Status: DC
  Filled 2019-06-13 (×2): qty 10

## 2019-06-13 MED ORDER — LEVETIRACETAM 100 MG/ML PO SOLN
500.0000 mg | Freq: Two times a day (BID) | ORAL | Status: DC
  Administered 2019-06-13 – 2019-06-14 (×3): 500 mg via ORAL
  Filled 2019-06-13 (×4): qty 5

## 2019-06-13 NOTE — Progress Notes (Signed)
[facility] at [place] NAME: Adrian Valdez    MR#:  [id number]  DATE OF BIRTH:  05/06/1951  SUBJECTIVE:  CHIEF COMPLAINT:   Chief Complaint  Patient presents with  . Fall    -Sleeping this morning.  Ambulated with PT late yesterday.  Awaiting disposition plan - has a foley catheter for acute urinary retention  REVIEW OF SYSTEMS:  Review of Systems  Unable to perform ROS: Mental status change    DRUG ALLERGIES:   Allergies  Allergen Reactions  . Oxybutynin Other (See Comments)    Headache  . Myrbetriq [Mirabegron] Other (See Comments)    Other reaction(s): Other (See Comments) migraine Hallucinations      VITALS:  Blood pressure 94/70, pulse 72, temperature 98.5 F (36.9 C), temperature source Oral, resp. rate 19, height 5\' 10"  (1.778 m), weight 88 kg, SpO2 96 %.  PHYSICAL EXAMINATION:  Physical Exam    GENERAL:  68 y.o.-year-old patient, resting in bed.  Not alert EYES: Pupils equal, round, reactive to light and accommodation. No scleral icterus.  HEENT: Head atraumatic, normocephalic. Oropharynx and nasopharynx clear.  NECK:  Supple, no jugular venous distention. No thyroid enlargement, no tenderness.  LUNGS: Normal breath sounds bilaterally, no wheezing, rales,rhonchi or crepitation. No use of accessory muscles of respiration.  Decreased bibasilar breath sound CARDIOVASCULAR: S1, S2 normal. No murmurs, rubs, or gallops.  ABDOMEN: Soft, nontender, nondistended. Bowel sounds present. No organomegaly or mass.  EXTREMITIES: No pedal edema, cyanosis, or clubbing.  NEUROLOGIC: Moving all extremities in bed.  No obvious facial droop noted.Marland Kitchen  PSYCHIATRIC: The patient is sleeping.    SKIN: No obvious rash, lesion, or ulcer.    LABORATORY PANEL:   CBC Recent Labs  Lab 06/07/19 0047  WBC 12.3*  HGB 14.4  HCT 42.3  PLT 283    ------------------------------------------------------------------------------------------------------------------  Chemistries  Recent Labs  Lab 06/09/19 0545  NA 137  K 4.2  CL 100  CO2 28  GLUCOSE 151*  BUN 22  CREATININE 0.89  CALCIUM 8.6*  AST 15  ALT 16  ALKPHOS 62  BILITOT 1.0   ------------------------------------------------------------------------------------------------------------------  Cardiac Enzymes No results for input(s): TROPONINI in the last 168 hours. ------------------------------------------------------------------------------------------------------------------  RADIOLOGY:  No results found.  EKG:   Orders placed or performed during the hospital encounter of 05/27/19  . ED EKG  . ED EKG  . EKG 12-Lead  . EKG 12-Lead    ASSESSMENT AND PLAN:   Adrian Valdez.is a 68 y.o.malewith a history of advanced dementia, diabetes, hypertension, Waldenstrommacroglobulinemia, anemia admitted after unwitnessed mechanical fall. Patient was found on the floor next to his bed. Unclear how long patient was on the floor. Per EMS staff at the nursing facility witness generalized tonic clonic seizures.  1.Generalized tonic clonic seizure new onset, unclear etiology -CT had negative for stroke. Posterior parietal scalp hematoma present on the right -MRI brain right posterior parietal scalp hematoma -Neurology following.   -Continue  Keppra 500 mg BID -EEG did not show any new seizure -Seizure precautions  2.DM-2 -Blood sugar better controlled at this time--not on any meds at present. Cont SSI TID  3. Agitation/combativness with progressive dementia  - more calmer as more sedated during the day. Dc tele sitter -Appreciate psych input.  Patient is started on Haldol twice daily.  Also on Seroquel at bedtime. With improvement in agitation lately. - Continue to monitor closely. -Appreciate palliative care input as well  4. Unwitnessed fall with  right  posterior parietal scalp hematoma -avoid antiplatelet agent. DC Lovenox  5.  Hypertension-patient on hydralazine and lisinopril  6. History of dementia, chronic -Patient on Namenda and Aricept  7. Urinary rentention-has known BPH.   -Received 3 times daily in and out, we were concerned about placing Foley catheter as patient might risk pulling it out causing hematuria and urethral trauma.  However he is more calmer now with the medications.  Foley is inserted now --Increased his Flomax and also added dutasteride - appreciate urology consult, continue foley per them   Discharge possibly back to [facility] with hospice services.. Social worker updated again.     All the records are reviewed and case discussed with Care Management/Social Workerr. Management plans discussed with the patient, family and they are in agreement.  CODE STATUS: DNR  TOTAL TIME TAKING CARE OF THIS PATIENT: 31 minutes.   POSSIBLE D/C IN 1-2 DAYS, DEPENDING ON CLINICAL CONDITION.   Gladstone Lighter M.D on 06/13/2019 at 11:42 AM  Between 7am to 6pm - Pager - [phone]  After 6pm go to [email or web address] - Proofreader  [facility]  Office  [phone]  CC: Primary care physician; Lawson Radar, NP

## 2019-06-13 NOTE — NC FL2 (Signed)
[place] LEVEL OF CARE SCREENING TOOL     IDENTIFICATION  Patient Name: Adrian Valdez. Birthdate: 06/20/1951 Sex: male Admission Date (Current Location): 05/27/2019  [place] and [place] Number:  Engineering geologist and Address:  [facility], [address] [place], [place] [address]      Provider Number: [id number]  Attending Physician Name and Address:  Gladstone Lighter, MD  Relative Name and Phone Number:  Randell Bessett [phone]    Current Level of Care: Hospital Recommended Level of Care: [place] Prior Approval Number:    Date Approved/Denied:   PASRR Number: [id number]  Discharge Plan: SNF    Current Diagnoses: Patient Active Problem List   Diagnosis Date Noted  . Pressure injury of skin 06/02/2019  . Palliative care by specialist   . Fall   . Hematoma of scalp   . Dementia with behavioral disturbance ([place])   . Seizure ([place]) 05/27/2019  . Cellulitis 01/03/2019  . Necrosis of finger ([place]) 01/03/2019  . Cellulitis of hand 10/30/2018  . Goals of care, counseling/discussion 11/20/2016  . Encounter for antineoplastic chemotherapy 10/30/2016  . Altered mental status 09/22/2016  . Waldenstrom's macroglobulinemia ([place]) 09/01/2016  . Enlarged prostate with urinary obstruction 08/16/2016  . Prostate cancer screening 07/19/2016  . Right Non-Complex Renal Cyst 07/19/2016  . Dysuria 11/30/2015  . Urgency of urination 11/30/2015  . Nocturia 11/30/2015  . 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 B12 deficiency 07/27/2015  . 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 Confusion state 07/07/2015  . BP (high blood pressure) 07/07/2015  . Major depressive disorder, single episode, moderate ([place]) 07/07/2015  . FOM (frequency of micturition) 07/07/2015    Orientation RESPIRATION BLADDER Height & Weight     Self  Normal Indwelling catheter Weight: 88 kg Height:  5\' 10"  (177.8  cm)  BEHAVIORAL SYMPTOMS/MOOD NEUROLOGICAL BOWEL NUTRITION STATUS      Incontinent Diet  AMBULATORY STATUS COMMUNICATION OF NEEDS Skin   Limited Assist Verbally Bruising, Skin abrasions                       Personal Care Assistance Level of Assistance  Bathing, Feeding, Dressing, Total care Bathing Assistance: Limited assistance Feeding assistance: Limited assistance Dressing Assistance: Limited assistance Total Care Assistance: Maximum assistance   Functional Limitations Info  Sight, Speech, Hearing Sight Info: Adequate Hearing Info: Adequate Speech Info: Adequate    SPECIAL CARE FACTORS FREQUENCY  PT (By licensed PT), OT (By licensed OT)     PT Frequency: 5x per week OT Frequency: 5x per week            Contractures Contractures Info: Not present    Additional Factors Info  Code Status Code Status Info: DNR Allergies Info: oxybutynin, myrbetriq           Current Medications (06/13/2019):  This is the current hospital active medication list Current Facility-Administered Medications  Medication Dose Route Frequency Provider Last Rate Last Dose  . acetaminophen (TYLENOL) tablet 650 mg  650 mg Oral Q6H PRN Harrie Foreman, MD   650 mg at 06/09/19 2218   Or  . acetaminophen (TYLENOL) suppository 650 mg  650 mg Rectal Q6H PRN Harrie Foreman, MD      . Chlorhexidine Gluconate Cloth 2 % PADS 6 each  6 each Topical Daily Gladstone Lighter, MD   6 each at 06/13/19 [phone]  . donepezil (ARICEPT) tablet 10 mg  10  mg Oral QHS Harrie Foreman, MD   10 mg at 06/12/19 2116  . dutasteride (AVODART) capsule 0.5 mg  0.5 mg Oral Daily Patrecia Pour, NP   0.5 mg at 06/13/19 [id number]  . haloperidol (HALDOL) tablet 2 mg  2 mg Oral BID Patrecia Pour, NP   2 mg at 06/13/19 [id number]  . hydrALAZINE (APRESOLINE) injection 10 mg  10 mg Intravenous Q6H PRN Max Sane, MD      . insulin aspart (novoLOG) injection 0-5 Units  0-5 Units Subcutaneous QHS Harrie Foreman, MD   2 Units  at 06/12/19 2141  . insulin aspart (novoLOG) injection 0-9 Units  0-9 Units Subcutaneous TID WC Harrie Foreman, MD   2 Units at 06/13/19 [phone]  . latanoprost (XALATAN) 0.005 % ophthalmic solution 1 drop  1 drop Both Eyes QHS Harrie Foreman, MD   1 drop at 06/12/19 2140  . levETIRAcetam (KEPPRA) 100 MG/ML solution 500 mg  500 mg Oral BID Lu Duffel, RPH   500 mg at 06/13/19 1037  . memantine (NAMENDA) tablet 10 mg  10 mg Oral BID Harrie Foreman, MD   10 mg at 06/13/19 0825  . multivitamin liquid 15 mL  15 mL Oral Daily Shanlever, Pierce Crane, RPH      . ondansetron [facility]) tablet 4 mg  4 mg Oral Q6H PRN Harrie Foreman, MD       Or  . ondansetron [facility]) injection 4 mg  4 mg Intravenous Q6H PRN Harrie Foreman, MD      . protein supplement (ENSURE MAX) liquid  11 oz Oral BID BM Max Sane, MD   11 oz at 06/12/19 2140  . QUEtiapine (SEROQUEL) tablet 150 mg  150 mg Oral QHS Cristofano, Dorene Ar, MD   150 mg at 06/12/19 2113  . senna-docusate (Senokot-S) tablet 1 tablet  1 tablet Oral BID Fritzi Mandes, MD   1 tablet at 06/13/19 0825  . tamsulosin (FLOMAX) capsule 0.8 mg  0.8 mg Oral Daily Gladstone Lighter, MD   0.8 mg at 06/13/19 0825  . timolol (TIMOPTIC) 0.5 % ophthalmic solution 1 drop  1 drop Both Eyes BID Harrie Foreman, MD   1 drop at 06/13/19 0827  . vitamin B-12 (CYANOCOBALAMIN) tablet 1,000 mcg  1,000 mcg Oral Daily Harrie Foreman, MD   1,000 mcg at 06/13/19 [id number]     Discharge Medications: Please see discharge summary for a list of discharge medications.  Relevant Imaging Results:  Relevant Lab Results:   Additional Information ss # [id number]. Pt from ALF. Needs SNF prior to return to facility  Shelbie Hutching, RN

## 2019-06-13 NOTE — Telephone Encounter (Signed)
Done

## 2019-06-13 NOTE — Consult Note (Signed)
Urology Consult  I have been asked to see the patient by Dr. Tressia Miners, for evaluation and management of acute urinary retention.  Chief Complaint: Urinary retention  History of Present Illness: Adrian Valdez. is a 68 y.o. year old male with PMH hypertension, DMII, dementia, and Waldenstrom's gammaglobulinemia admitted on 05/27/2019 following an unwitnessed fall in his living facility, who subsequently suffered a tonic-clonic seizure in the ED.   He was found to have urinary retention during the course of his hospitalization, bladder scan volumes up to 661mL. Per nursing report, patient has previously pulled out a Foley catheter. He was managed with I&O catheterizations for several days before having a Foley placed two days ago.  Urological history significant for BPH, on tamsulosin and finasteride.   Foley in place draining clear yellow urine today. Patient wearing safety mitts with sitter at bedside. He reports feeling well today.   VSS, most recent creatinine 0.89 on 06/09/2019. UA from 06/07/2019 with >50 RBC/hpf and >50 WBC/hpf. No urine culture ordered.  Anti-infectives (From admission, onward)   None      Past Medical History:  Diagnosis Date  . 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 B12 deficiency 07/27/2015  . Dementia ([place])   . Diabetes mellitus, type 2 ([place])   . Enlarged prostate   . Glaucoma   . Glaucoma   . Hypertension   . Neuropathy    feet and legs  . Urinary frequency   . Vitamin B 12 deficiency   . Waldenstrom macroglobulinemia ([place])   . Waldenstrom's macroglobulinemia [facility])     Past Surgical History:  Procedure Laterality Date  . CATARACT EXTRACTION W/ INTRAOCULAR LENS IMPLANT    . COLONOSCOPY    . I&D EXTREMITY Bilateral 01/03/2019   Procedure: IRRIGATION AND DEBRIDEMENT right index finger, and left middle finger;  Surgeon: Leanora Cover, MD;  Location: [place];  Service: Orthopedics;  Laterality: Bilateral;  . KNEE ARTHROSCOPY Right   .  LASIK    . PHOTOCOAGULATION WITH LASER Right 05/24/2017   Procedure: PHOTOCOAGULATION WITH LASER;  Surgeon: Leandrew Koyanagi, MD;  Location: [facility];  Service: Ophthalmology;  Laterality: Right;  Diabetic - oral meds    Home Medications:  No outpatient medications have been marked as taking for the 05/27/19 encounter [facility] Encounter).    Allergies:  Allergies  Allergen Reactions  . Oxybutynin Other (See Comments)    Headache  . Myrbetriq [Mirabegron] Other (See Comments)    Other reaction(s): Other (See Comments) migraine Hallucinations      Family History  Problem Relation Age of Onset  . Hypertension Mother   . Hypertension Father   . CAD Father   . Breast cancer Maternal Grandmother   . Ovarian cancer Paternal Grandmother   . Kidney disease Neg Hx   . Prostate cancer Neg Hx     Social History:  reports that he has never smoked. He has never used smokeless tobacco. He reports current alcohol use. He reports that he does not use drugs.  ROS: A complete review of systems was performed.  All systems are negative except for pertinent findings as noted.  Physical Exam:  Vital signs in last 24 hours: Temp:  [97.7 F (36.5 C)-99.5 F (37.5 C)] 98.5 F (36.9 C) (10/08 0458) Pulse Rate:  [72-116] 72 (10/08 0458) Resp:  [17-19] 19 (10/08 0458) BP: (91-157)/(67-92) 94/70 (10/08 0458) SpO2:  [96 %-100 %] 96 % (10/08 0458) Weight:  [88 kg] 88 kg (10/08 0644)  Constitutional:  Awake and alert, no acute distress HEENT: [place] AT, moist mucus membranes Cardiovascular: No cyanosis or edema Respiratory: Normal respiratory effort Skin: No rashes, bruises or suspicious lesions Neurologic: Grossly intact, no focal deficits, moving all 4 extremities Psychiatric: Appropriate verbal responses to questions, safety mitts in place  Laboratory Data:  Urinalysis    Component Value Date/Time   COLORURINE RED (A) 06/07/2019 0039   APPEARANCEUR BLOODY (A) 06/07/2019 0039    APPEARANCEUR Clear 02/15/2018 1122   LABSPEC 1.025 06/07/2019 0039   PHURINE  06/07/2019 0039    TEST NOT REPORTED DUE TO COLOR INTERFERENCE OF URINE PIGMENT   GLUCOSEU (A) 06/07/2019 0039    TEST NOT REPORTED DUE TO COLOR INTERFERENCE OF URINE PIGMENT   HGBUR (A) 06/07/2019 0039    TEST NOT REPORTED DUE TO COLOR INTERFERENCE OF URINE PIGMENT   BILIRUBINUR (A) 06/07/2019 0039    TEST NOT REPORTED DUE TO COLOR INTERFERENCE OF URINE PIGMENT   BILIRUBINUR Negative 02/15/2018 1122   KETONESUR (A) 06/07/2019 0039    TEST NOT REPORTED DUE TO COLOR INTERFERENCE OF URINE PIGMENT   PROTEINUR (A) 06/07/2019 0039    TEST NOT REPORTED DUE TO COLOR INTERFERENCE OF URINE PIGMENT   NITRITE (A) 06/07/2019 0039    TEST NOT REPORTED DUE TO COLOR INTERFERENCE OF URINE PIGMENT   LEUKOCYTESUR (A) 06/07/2019 0039    TEST NOT REPORTED DUE TO COLOR INTERFERENCE OF URINE PIGMENT   Assessment & Plan:  Patient with dementia and acute urinary retention in the setting of an unwitnessed fall and subsequent seizure.  History of agitation and possible pulling of catheters.  He is a urology patient, managed by Dr. Junious Silk, on tamsulosin and finasteride for BPH.  Recommend continued Foley catheterization for 1 to 2 weeks with subsequent voiding trial in clinic.  Patient did have one UA during admission that is concerning for possible UTI, however cannot rule out colonization given his clinical picture. Recommend repeat UA and culture if he is not at his cognitive baseline.  Thank you for involving me in this patient's care, please page with any further questions or concerns.  Debroah Loop, PA-C 06/13/2019 9:23 AM

## 2019-06-13 NOTE — TOC Progression Note (Signed)
Transition of Care (TOC) - Progression Note    Patient Details  Name: Adrian Valdez. MRN: [id number] Date of Birth: 1951/05/13  Transition of [facility]) CM/SW Contact  Shelbie Hutching, RN Phone Number: 06/13/2019, 12:19 PM  Clinical Narrative:    [name] is willing to accept patient and patient's brother [name] is considering this option.  Medicare will pay for 100 days of rehab and [employer] will call [name] and talk with him about spending down and applying for long term Medicaid.  Outpatient palliative with [facility] can follow patient at [facility].    Expected Discharge Plan: [place] Barriers to Discharge: Requiring sitter/restraints  Expected Discharge Plan and Services Expected Discharge Plan: [place]   Discharge Planning Services: CM Consult Post Acute Care Choice: [place] Living arrangements for the past 2 months: [place] Expected Discharge Date: 05/31/19                                     Social Determinants of Health (SDOH) Interventions    Readmission Risk Interventions No flowsheet data found.

## 2019-06-13 NOTE — TOC Progression Note (Signed)
Transition of Care (TOC) - Progression Note    Patient Details  Name: Adrian Valdez. MRN: [id number] Date of Birth: 06-24-1951  Transition of Care [facility]) CM/SW Contact  Shelbie Hutching, RN Phone Number: 06/13/2019, 3:09 PM  Clinical Narrative:    Admissions coordinator from [facility] has spoken with patient's brother [name] and patient is good to go to the facility as soon as tomorrow as long as medically clear.  RNCM will follow up with patient's brother [name] tomorrow.  Patient will need a repeat COVID before discharge.    Expected Discharge Plan: [place] Barriers to Discharge: Requiring sitter/restraints  Expected Discharge Plan and Services Expected Discharge Plan: [place]   Discharge Planning Services: CM Consult Post Acute Care Choice: [place] Living arrangements for the past 2 months: [place] Expected Discharge Date: 05/31/19                                     Social Determinants of Health (SDOH) Interventions    Readmission Risk Interventions No flowsheet data found.

## 2019-06-13 NOTE — Telephone Encounter (Signed)
-----   Message from Billey Co, MD sent at 06/13/2019 12:42 PM EDT ----- Regarding: follow up Please schedule follow up with [facility] in 1-2 weeks for void trial and PVR  Thanks Nickolas Madrid, MD 06/13/2019

## 2019-06-14 LAB — GLUCOSE, CAPILLARY
Glucose-Capillary: 130 mg/dL — ABNORMAL HIGH (ref 70–99)
Glucose-Capillary: 187 mg/dL — ABNORMAL HIGH (ref 70–99)

## 2019-06-14 LAB — SARS CORONAVIRUS 2 (TAT 6-24 HRS): SARS Coronavirus 2: NEGATIVE

## 2019-06-14 MED ORDER — DUTASTERIDE 0.5 MG PO CAPS: 0.5000 mg | ORAL_CAPSULE | Freq: Every day | ORAL | 2 refills | Status: AC

## 2019-06-14 MED ORDER — QUETIAPINE FUMARATE 50 MG PO TABS: 150.0000 mg | ORAL_TABLET | Freq: Every day | ORAL | 0 refills | Status: AC

## 2019-06-14 MED ORDER — ADULT MULTIVITAMIN LIQUID CH: 15.0000 mL | Freq: Every day | ORAL | 0 refills | Status: AC

## 2019-06-14 MED ORDER — CEPHALEXIN 250 MG PO CAPS: 250.0000 mg | ORAL_CAPSULE | Freq: Four times a day (QID) | ORAL | 0 refills | Status: AC

## 2019-06-14 MED ORDER — HALOPERIDOL 2 MG PO TABS: 2.0000 mg | ORAL_TABLET | Freq: Two times a day (BID) | ORAL | 0 refills | Status: AC

## 2019-06-14 MED ORDER — TAMSULOSIN HCL 0.4 MG PO CAPS: 0.8000 mg | ORAL_CAPSULE | Freq: Every day | ORAL | 1 refills | Status: AC

## 2019-06-14 NOTE — Progress Notes (Signed)
[facility] NAME: Adrian Valdez    MR#:  [id number]  DATE OF BIRTH:  [birth year]  SUBJECTIVE:  CHIEF COMPLAINT:   Chief Complaint  Patient presents with  . Fall    -Patient pulled out his Foley catheter, initially bloody urine returned and some clots. -In and out again this morning consult later but showing very cloudy urine.  REVIEW OF SYSTEMS:  Review of Systems  Unable to perform ROS: Mental status change    DRUG ALLERGIES:   Allergies  Allergen Reactions  . Oxybutynin Other (See Comments)    Headache  . Myrbetriq [Mirabegron] Other (See Comments)    Other reaction(s): Other (See Comments) migraine Hallucinations      VITALS:  Blood pressure 112/61, pulse 69, temperature 98.8 F (37.1 C), temperature source Oral, resp. rate 18, height 5\' 10"  (1.778 Valdez), weight 89.8 kg, SpO2 97 %.  PHYSICAL EXAMINATION:  Physical Exam    GENERAL:  68 y.o.-year-old patient, resting in bed.  Not alert EYES: Pupils equal, round, reactive to light and accommodation. No scleral icterus.  HEENT: Head atraumatic, normocephalic. Oropharynx and nasopharynx clear.  NECK:  Supple, no jugular venous distention. No thyroid enlargement, no tenderness.  LUNGS: Normal breath sounds bilaterally, no wheezing, rales,rhonchi or crepitation. No use of accessory muscles of respiration.  Decreased bibasilar breath sound CARDIOVASCULAR: S1, S2 normal. No murmurs, rubs, or gallops.  ABDOMEN: Soft, nontender, nondistended. Bowel sounds present. No organomegaly or mass.  EXTREMITIES: No pedal edema, cyanosis, or clubbing.  NEUROLOGIC: Moving all extremities in bed.  No obvious facial droop noted.Marland Kitchen  PSYCHIATRIC: The patient is sleeping.    SKIN: No obvious rash, lesion, or ulcer.    LABORATORY PANEL:   CBC No results for input(s): WBC, HGB, HCT, PLT in the last 168 hours.  ------------------------------------------------------------------------------------------------------------------  Chemistries  Recent Labs  Lab 06/09/19 0545  NA 137  K 4.2  CL 100  CO2 28  GLUCOSE 151*  BUN 22  CREATININE 0.89  CALCIUM 8.6*  AST 15  ALT 16  ALKPHOS 62  BILITOT 1.0   ------------------------------------------------------------------------------------------------------------------  Cardiac Enzymes No results for input(s): TROPONINI in the last 168 hours. ------------------------------------------------------------------------------------------------------------------  RADIOLOGY:  Ct Head Wo Contrast  Result Date: 06/13/2019 CLINICAL DATA:  Altered mental status (AMS), unclear cause. Additional history provided: 68 year old male with history of advanced dementia, diabetes, hypertension, Waldenstroms macroglobulinemia, anemia admitted after unwitnessed mechanical fall. Patient found on floor next to bed. EXAM: CT HEAD WITHOUT CONTRAST TECHNIQUE: Contiguous axial images were obtained from the base of the skull through the vertex without intravenous contrast. COMPARISON:  Brain MRI 05/27/2019, head CT 05/27/2019 FINDINGS: Brain: No evidence of acute intracranial hemorrhage. No demarcated cortical infarction. No evidence of intracranial mass. No midline shift or extra-axial fluid collection. Mild chronic small vessel ischemic disease was better appreciated on prior MRI 05/27/2019. Mild-to-moderate generalized parenchymal atrophy. Partially empty sella turcica. Vascular: No hyperdense vessel. Atherosclerotic calcification of the carotid artery siphons. Skull: No calvarial fracture. Sinuses/Orbits: Visualized orbits demonstrate no acute abnormality. No significant paranasal sinus disease or mastoid effusion at the imaged levels IMPRESSION: 1. No evidence of acute intracranial abnormality. 2. Generalized parenchymal atrophy with mild chronic small vessel ischemic disease.  Electronically Signed   By: Kellie Simmering   On: 06/13/2019 16:27    EKG:   Orders placed or performed during the hospital encounter of 05/27/19  . ED EKG  . ED EKG  . EKG 12-Lead  .  EKG 12-Lead    ASSESSMENT AND PLAN:   Adrian Valdez.is a 68 y.o.malewith a history of advanced dementia, diabetes, hypertension, Waldenstrommacroglobulinemia, anemia admitted after unwitnessed mechanical fall. Patient was found on the floor next to his bed. Unclear how long patient was on the floor. Per EMS staff at the nursing facility witness generalized tonic clonic seizures.  1.Generalized tonic clonic seizure new onset, unclear etiology -CT had negative for stroke. Posterior parietal scalp hematoma present on the right -MRI brain right posterior parietal scalp hematoma -on Keppra 500 mg BID -EEG did not show any new seizure -Seizure precautions -Repeat CT of the head without any acute findings  2.DM-2 -Blood sugar better controlled at this time--not on any meds at present. Cont SSI TID  3. Agitation/combativness with progressive dementia  - more calmer now -Appreciate psych input.  Patient is started on Haldol twice daily.  Also on Seroquel at bedtime. With improvement in agitation lately. - Continue to monitor closely. -Appreciate palliative care input as well  4. Unwitnessed fall with right posterior parietal scalp hematoma -avoid antiplatelet agent. DC Lovenox  5.  Hypertension-patient on hydralazine and lisinopril  6. History of dementia, chronic -Patient on Namenda and Aricept  7. Urinary rentention-has known BPH.  Also has acute UTI. Foley catheter pulled out twice.  Continue to do in and out catheterizations.  We will also place on oral antibiotics.  Risk of infections given multiple catheterizations explained to family. -Appreciate urology consult. --Increased his Flomax and also added dutasteride -No further changes to plan   Discharge to [facility]  today with palliative services and then conversion to hospice     All the records are reviewed and case discussed with Care Management/Social Workerr. Management plans discussed with the patient, family and they are in agreement.  CODE STATUS: DNR  TOTAL TIME TAKING CARE OF THIS PATIENT: 39 minutes.   POSSIBLE D/C IN 1-2 DAYS, DEPENDING ON CLINICAL CONDITION.   Adrian Valdez.D on 06/14/2019 at 10:32 AM  Between 7am to 6pm - Pager - [phone]  After 6pm go to [email or web address] - Proofreader  [facility]  Office  [phone]  CC: Primary care physician; Lawson Radar, NP

## 2019-06-14 NOTE — Discharge Summary (Addendum)
[facility] [place] NAME: Adrian Valdez    MR#:  [id number]  DATE OF BIRTH:  Apr 06, 1951  DATE OF ADMISSION:  05/27/2019   ADMITTING PHYSICIAN: Harrie Foreman, MD  DATE OF DISCHARGE:  06/14/19  PRIMARY CARE PHYSICIAN: Lawson Radar, NP   ADMISSION DIAGNOSIS:   Seizure ([place]) [R56.9] Hematoma of scalp, initial encounter [S00.03XA] Fall, initial encounter [W19.XXXA]  DISCHARGE DIAGNOSIS:   Active Problems:   Major depressive disorder, single episode, moderate (HCC)   Goals of care, counseling/discussion   Seizure [facility])   Palliative care by specialist   Fall   Hematoma of scalp   Dementia with behavioral disturbance ([place])   Pressure injury of skin   SECONDARY DIAGNOSIS:   Past Medical History:  Diagnosis Date  . 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 B12 deficiency 07/27/2015  . Dementia ([place])   . Diabetes mellitus, type 2 ([place])   . Enlarged prostate   . Glaucoma   . Glaucoma   . Hypertension   . Neuropathy    feet and legs  . Urinary frequency   . Vitamin B 12 deficiency   . Waldenstrom macroglobulinemia ([place])   . Waldenstrom's macroglobulinemia [facility]     HOSPITAL COURSE:   Adrian Valdezis a 68 y.o.malewith a history of advanced dementia, diabetes, hypertension, Waldenstrommacroglobulinemia, anemia admitted after unwitnessed mechanical fall. Patient was found on the floor next to his bed. Unclear how long patient was on the floor. Per EMS staff at the nursing facility witness generalized tonic clonic seizures.  1.Generalized tonic clonic seizure new onset, unclear etiology -CT had negative for stroke. Posterior parietal scalp hematoma present on the right -MRI brain right posterior parietal scalp hematoma -onKeppra 500 mg BID -EEG did not show any new seizure -Seizure precautions -Repeat CT of the head without any acute findings  2. Urinary rentention-has known BPH.  Also has acute UTI.  Foley catheter pulled out twice.  Continue to do in and out catheterizations.  -Urine analysis showing infection.  Cultures are pending.  Currently started on Keflex as no fever showed elevated white count. -Patient will need in and out catheterizations every 8 hours -  Risk of infections given multiple catheterizations explained to family. -Appreciate urology consult. --Increased his Flomax and also added dutasteride  3. Agitation/combativness with progressive dementia - more calmer now -Appreciate psych input.  Patient is started on Haldol twice daily.  Also on Seroquel at bedtime. With improvement in agitation lately. - Continue to monitor closely. -Appreciate palliative care input as well -If not improving, consider hospice services  4. Unwitnessed fall with right posterior parietal scalp hematoma -avoid antiplatelet agent. DC Lovenox  5.  Hypertension-patient on hydralazine and lisinopril  6. History of dementia, chronic -Patient on Namenda and Aricept  Patient will be discharged to [facility] today Palliative services to be following   DISCHARGE CONDITIONS:   Guarded  CONSULTS OBTAINED:   Treatment Team:  Catarina Hartshorn, MD Alexis Goodell, MD Sharma Covert, MD Cristofano, Dorene Ar, MD Billey Co, MD  DRUG ALLERGIES:   Allergies  Allergen Reactions  . Oxybutynin Other (See Comments)    Headache  . Myrbetriq [Mirabegron] Other (See Comments)    Other reaction(s): Other (See Comments) migraine Hallucinations     DISCHARGE MEDICATIONS:   Allergies as of 06/14/2019      Reactions   Oxybutynin Other (See Comments)   Headache   Myrbetriq [mirabegron] Other (See Comments)  Other reaction(s): Other (See Comments) migraine Hallucinations       Medication List    STOP taking these medications   diclofenac sodium 1 % Gel Commonly known as: VOLTAREN   gabapentin 300 MG capsule Commonly known as: NEURONTIN   glimepiride 2  MG tablet Commonly known as: AMARYL   guaifenesin 400 MG Tabs tablet Commonly known as: HUMIBID E   HYDROcodone-acetaminophen 5-325 MG tablet Commonly known as: NORCO/VICODIN   lisinopril 20 MG tablet Commonly known as: ZESTRIL   meloxicam 15 MG tablet Commonly known as: MOBIC   methazolamide 50 MG tablet Commonly known as: NEPTAZANE   mupirocin ointment 2 % Commonly known as: BACTROBAN   sertraline 50 MG tablet Commonly known as: ZOLOFT   triamcinolone cream 0.1 % Commonly known as: KENALOG     TAKE these medications   acetaminophen 650 MG CR tablet Commonly known as: TYLENOL Take 650 mg by mouth every 6 (six) hours as needed for pain.   cephALEXin 250 MG capsule Commonly known as: KEFLEX Take 1 capsule (250 mg total) by mouth every 6 (six) hours for 10 days.   donepezil 10 MG tablet Commonly known as: ARICEPT Take 10 mg by mouth at bedtime.   dutasteride 0.5 MG capsule Commonly known as: AVODART Take 1 capsule (0.5 mg total) by mouth daily. Start taking on: June 15, 2019   fluticasone 50 MCG/ACT nasal spray Commonly known as: FLONASE Place 2 sprays into both nostrils daily.   haloperidol 2 MG tablet Commonly known as: HALDOL Take 1 tablet (2 mg total) by mouth 2 (two) times daily.   levETIRAcetam 100 MG/ML solution Commonly known as: KEPPRA Take 5 mLs (500 mg total) by mouth 2 (two) times daily.   memantine 10 MG tablet Commonly known as: NAMENDA Take 10 mg by mouth 2 (two) times daily.   multivitamin Liqd Take 15 mLs by mouth daily. Start taking on: June 15, 2019   QUEtiapine 50 MG tablet Commonly known as: SEROQUEL Take 3 tablets (150 mg total) by mouth at bedtime. What changed:   medication strength  how much to take  when to take this   tamsulosin 0.4 MG Caps capsule Commonly known as: FLOMAX Take 2 capsules (0.8 mg total) by mouth daily. Start taking on: June 15, 2019 What changed: how much to take   timolol 0.5 %  ophthalmic solution Commonly known as: BETIMOL Place 1 drop into both eyes 2 (two) times daily.   Travoprost (BAK Free) 0.004 % Soln ophthalmic solution Commonly known as: TRAVATAN Place 1 drop into both eyes at bedtime.   vitamin B-12 1000 MCG tablet Commonly known as: CYANOCOBALAMIN Take 1,000 mcg by mouth. One time every month on the 8th of every month        DISCHARGE INSTRUCTIONS:   1.  PCP follow-up in 1 week 2.  Neurology follow-up in 1 week 3.  Palliative care follow-up  DIET:   Regular diet  ACTIVITY:   Activity as tolerated  OXYGEN:   Home Oxygen: No.  Oxygen Delivery: room air  DISCHARGE LOCATION:   nursing home   If you experience worsening of your admission symptoms, develop shortness of breath, life threatening emergency, suicidal or homicidal thoughts you must seek medical attention immediately by calling 911 or calling your MD immediately  if symptoms less severe.  You Must read complete instructions/literature along with all the possible adverse reactions/side effects for all the Medicines you take and that have been prescribed to you. Take any  new Medicines after you have completely understood and accpet all the possible adverse reactions/side effects.   Please note  You were cared for by a hospitalist during your hospital stay. If you have any questions about your discharge medications or the care you received while you were in the hospital after you are discharged, you can call the unit and asked to speak with the hospitalist on call if the hospitalist that took care of you is not available. Once you are discharged, your primary care physician will handle any further medical issues. Please note that NO REFILLS for any discharge medications will be authorized once you are discharged, as it is imperative that you return to your primary care physician (or establish a relationship with a primary care physician if you do not have one) for your aftercare  needs so that they can reassess your need for medications and monitor your lab values.    On the day of Discharge:  VITAL SIGNS:   Blood pressure 112/61, pulse 69, temperature 98.8 F (37.1 C), temperature source Oral, resp. rate 18, height 5\' 10"  (1.778 m), weight 89.8 kg, SpO2 97 %.  PHYSICAL EXAMINATION:    GENERAL:  68 y.o.-year-old patient, resting in bed.  Not alert EYES: Pupils equal, round, reactive to light and accommodation. No scleral icterus.  HEENT: Head atraumatic, normocephalic. Oropharynx and nasopharynx clear.  NECK:  Supple, no jugular venous distention. No thyroid enlargement, no tenderness.  LUNGS: Normal breath sounds bilaterally, no wheezing, rales,rhonchi or crepitation. No use of accessory muscles of respiration.  Decreased bibasilar breath sound CARDIOVASCULAR: S1, S2 normal. No murmurs, rubs, or gallops.  ABDOMEN: Soft, nontender, nondistended. Bowel sounds present. No organomegaly or mass.  EXTREMITIES: No pedal edema, cyanosis, or clubbing.  NEUROLOGIC: Moving all extremities in bed.  No obvious facial droop noted.Marland Kitchen  PSYCHIATRIC: The patient is sleeping.    SKIN: No obvious rash, lesion, or ulcer. Marland Kitchen   DATA REVIEW:   CBC No results for input(s): WBC, HGB, HCT, PLT in the last 168 hours.  Chemistries  Recent Labs  Lab 06/09/19 0545  NA 137  K 4.2  CL 100  CO2 28  GLUCOSE 151*  BUN 22  CREATININE 0.89  CALCIUM 8.6*  AST 15  ALT 16  ALKPHOS 37  BILITOT 1.0     Microbiology Results  Results for orders placed or performed during the hospital encounter of 05/27/19  SARS CORONAVIRUS 2 (TAT 6-24 HRS) Nasopharyngeal Nasopharyngeal Swab     Status: None   Collection Time: 05/27/19  5:18 AM   Specimen: Nasopharyngeal Swab  Result Value Ref Range Status   SARS Coronavirus 2 NEGATIVE NEGATIVE Final    Comment: (NOTE) SARS-CoV-2 target nucleic acids are NOT DETECTED. The SARS-CoV-2 RNA is generally detectable in upper and lower respiratory  specimens during the acute phase of infection. Negative results do not preclude SARS-CoV-2 infection, do not rule out co-infections with other pathogens, and should not be used as the sole basis for treatment or other patient management decisions. Negative results must be combined with clinical observations, patient history, and epidemiological information. The expected result is Negative. Fact Sheet for Patients: SugarRoll.be Fact Sheet for Healthcare Providers: https://www.woods-mathews.com/ This test is not yet approved or cleared by the [place] FDA and  has been authorized for detection and/or diagnosis of SARS-CoV-2 by FDA under an Emergency Use Authorization (EUA). This EUA will remain  in effect (meaning this test can be used) for the duration of the COVID-19 declaration under  Section 56 4(b)(1) of the Act, 21 U.S.C. section 360bbb-3(b)(1), unless the authorization is terminated or revoked sooner. Performed at [facility], [place] [address] [place], [place] [address]   MRSA PCR Screening     Status: None   Collection Time: 05/27/19 11:04 AM   Specimen: Nasopharyngeal  Result Value Ref Range Status   MRSA by PCR NEGATIVE NEGATIVE Final    Comment:        The GeneXpert MRSA Assay (FDA approved for NASAL specimens only), is one component of a comprehensive MRSA colonization surveillance program. It is not intended to diagnose MRSA infection nor to guide or monitor treatment for MRSA infections. Performed at [facility], [place], [place] [address]   SARS CORONAVIRUS 2 (TAT 6-24 HRS) Nasopharyngeal Nasopharyngeal Swab     Status: None   Collection Time: 06/13/19  5:37 PM   Specimen: Nasopharyngeal Swab  Result Value Ref Range Status   SARS Coronavirus 2 NEGATIVE NEGATIVE Final    Comment: (NOTE) SARS-CoV-2 target nucleic acids are NOT DETECTED. The SARS-CoV-2 RNA is generally detectable in  upper and lower respiratory specimens during the acute phase of infection. Negative results do not preclude SARS-CoV-2 infection, do not rule out co-infections with other pathogens, and should not be used as the sole basis for treatment or other patient management decisions. Negative results must be combined with clinical observations, patient history, and epidemiological information. The expected result is Negative. Fact Sheet for Patients: SugarRoll.be Fact Sheet for Healthcare Providers: https://www.woods-mathews.com/ This test is not yet approved or cleared by the [place] FDA and  has been authorized for detection and/or diagnosis of SARS-CoV-2 by FDA under an Emergency Use Authorization (EUA). This EUA will remain  in effect (meaning this test can be used) for the duration of the COVID-19 declaration under Section 56 4(b)(1) of the Act, 21 U.S.C. section 360bbb-3(b)(1), unless the authorization is terminated or revoked sooner. Performed at [facility], [place] [address], [place],  [address]     RADIOLOGY:  Ct Head Wo Contrast  Result Date: 06/13/2019 CLINICAL DATA:  Altered mental status (AMS), unclear cause. Additional history provided: 68 year old male with history of advanced dementia, diabetes, hypertension, Waldenstroms macroglobulinemia, anemia admitted after unwitnessed mechanical fall. Patient found on floor next to bed. EXAM: CT HEAD WITHOUT CONTRAST TECHNIQUE: Contiguous axial images were obtained from the base of the skull through the vertex without intravenous contrast. COMPARISON:  Brain MRI 05/27/2019, head CT 05/27/2019 FINDINGS: Brain: No evidence of acute intracranial hemorrhage. No demarcated cortical infarction. No evidence of intracranial mass. No midline shift or extra-axial fluid collection. Mild chronic small vessel ischemic disease was better appreciated on prior MRI 05/27/2019. Mild-to-moderate  generalized parenchymal atrophy. Partially empty sella turcica. Vascular: No hyperdense vessel. Atherosclerotic calcification of the carotid artery siphons. Skull: No calvarial fracture. Sinuses/Orbits: Visualized orbits demonstrate no acute abnormality. No significant paranasal sinus disease or mastoid effusion at the imaged levels IMPRESSION: 1. No evidence of acute intracranial abnormality. 2. Generalized parenchymal atrophy with mild chronic small vessel ischemic disease. Electronically Signed   By: Kellie Simmering   On: 06/13/2019 16:27     Management plans discussed with the patient, family and they are in agreement.  CODE STATUS:     Code Status Orders  (From admission, onward)         Start     Ordered   05/31/19 0853  Do not attempt resuscitation (DNR)  Continuous    Question Answer  Comment  In the event of cardiac or respiratory ARREST Do not call a "code blue"   In the event of cardiac or respiratory ARREST Do not perform Intubation, CPR, defibrillation or ACLS   In the event of cardiac or respiratory ARREST Use medication by any route, position, wound care, and other measures to relive pain and suffering. May use oxygen, suction and manual treatment of airway obstruction as needed for comfort.      05/31/19 0852        Code Status History    Date Active Date Inactive Code Status Order ID Comments User Context   05/27/2019 0716 05/31/2019 0852 Full Code [id number]  Harrie Foreman, MD ED   01/04/2019 0115 01/04/2019 2026 Full Code [id number]  Leanora Cover, MD Inpatient   01/04/2019 0115 01/04/2019 0115 DNR [id number]  Somerset Bing, DO Inpatient   10/30/2018 2053 11/02/2018 1531 Full Code [id number]  Henreitta Leber, MD Inpatient   Advance Care Planning Activity    Advance Directive Documentation     Most Recent Value  Type of Advance Directive  Healthcare Power of [place], Living will  Pre-existing out of facility DNR order (yellow form or pink MOST form)  -  "MOST" Form in  Place?  -      TOTAL TIME TAKING CARE OF THIS PATIENT: 39 minutes.    Gladstone Lighter M.D on 06/14/2019 at 11:39 AM  Between 7am to 6pm - Pager - [phone]  After 6pm go to [email or web address] - Proofreader  [facility]  Office  [phone]  CC: Primary care physician; Lawson Radar, NP   Note: This dictation was prepared with Dragon dictation along with smaller phrase technology. Any transcriptional errors that result from this process are unintentional.

## 2019-06-14 NOTE — Progress Notes (Signed)
Patient was bladder scanned and it showed him to have 509 ml of urine. Patient was I&O'd and 250 ml of urine was returned. Urine had some blood clots in it. Will continue to monitor.

## 2019-06-14 NOTE — Care Management Important Message (Signed)
Important Message  Patient Details  Name: Adrian Valdez. MRN: [id number] Date of Birth: 1951/02/19   Medicare Important Message Given:  Yes     Dannette Barbara 06/14/2019, 11:35 AM

## 2019-06-14 NOTE — TOC Transition Note (Signed)
Transition of Care [facility]) - CM/SW Discharge Note   Patient Details  Name: Azzam Elsaesser. MRN: [id number] Date of Birth: [birth year]  Transition [facility]) CM/SW Contact:  Shelbie Hutching, RN Phone Number: 06/14/2019, 11:51 AM   Clinical Narrative:    Patient is medically stable for discharge today.  [facility] has offered a bed and family has accepted bed offer.  Patient will discharge to [facility] today, [facility] will provide transport.  Referral for outpatient palliative given to Flo Shanks with [facility], patient to be followed by palliative outpatient.  Brother [name] has been updated and aware to today's discharge.  Patient is going to room 5 A at [facility] will call report to [phone].    Final next level of care: [place] Barriers to Discharge: Barriers Resolved   Patient Goals and CMS Choice Patient states their goals for this hospitalization and ongoing recovery are:: Brother [name] is Healthcare POA and would like for the patient to return to [employer] Compare Post Acute Care list provided to:: Patient Represenative (must comment)([name] - Brother) Choice offered to / list presented to : [facility] POA / [place]  Discharge Placement              Patient chooses bed at: [facility] Patient to be transferred to facility by: [facility] Name of family member notified: Kodey Soares Brother Patient and family notified of of transfer: 06/14/19  Discharge Plan and Services   Discharge Planning Services: CM Consult Post Acute Care Choice: [place]                               Social Determinants of Health (SDOH) Interventions     Readmission Risk Interventions No flowsheet data found.

## 2019-06-15 LAB — URINE CULTURE: Culture: >=100000 — AB

## 2019-06-15 NOTE — Progress Notes (Signed)
Patient with seizures, advanced dementia, diabetes and hypertension who was discharged to [facility] on 06/14/2019.  He was noted to have UTI prior to discharge.  He was discharged on Keflex.  Urine cultures now resulted as ESBL E. coli. -Based on sensitivities, sending a prescription for nitrofurantoin for 7 days. -Patient has a urology appointment coming up, can be monitored further. He is followed by palliative care

## 2019-06-15 NOTE — Care Management (Signed)
Post discharge note entry: RNCM received notification from Dr. Tressia Miners that patient's discharge antibiotic will change to Nitroforantoin 100mg  PO BID for 7 days. I have notified Claiborne Billings at [facility] and faxed new prescription to [phone] at [facility] request.

## 2019-06-26 ENCOUNTER — Ambulatory Visit: Payer: BLUE CROSS/BLUE SHIELD | Admitting: Urology

## 2019-07-07 DEATH — deceased

## 2020-06-26 IMAGING — CT CT CERVICAL SPINE W/O CM
4 of 6 series · 15 of 33 positions shown, 16 images · non-contrast
Comparison: CT scan of July 01, 2015.

CLINICAL DATA: Right arm weakness.

EXAM:
CT HEAD WITHOUT CONTRAST
CT CERVICAL SPINE WITHOUT CONTRAST
TECHNIQUE: Multidetector CT imaging of the head and cervical spine was
performed following the standard protocol without intravenous
contrast. Multiplanar CT image reconstructions of the cervical spine
were also generated.

[Series 4: coronal soft tissue · coronal · 0.27mm/px · 3 of 66 slices shown]
[im 14/66  bone]
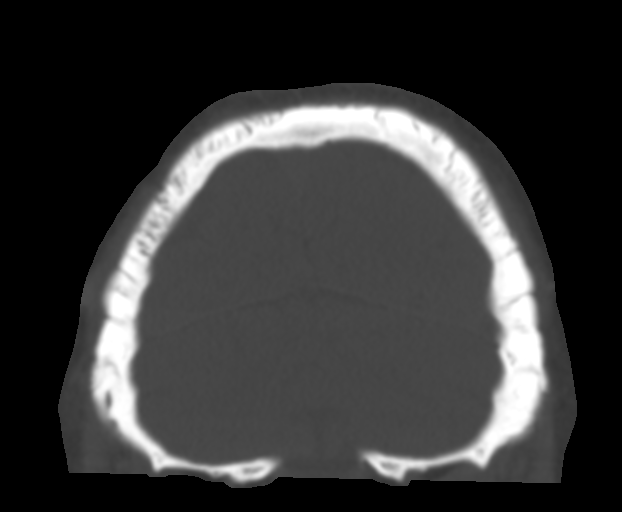
[im 27/66  bone]
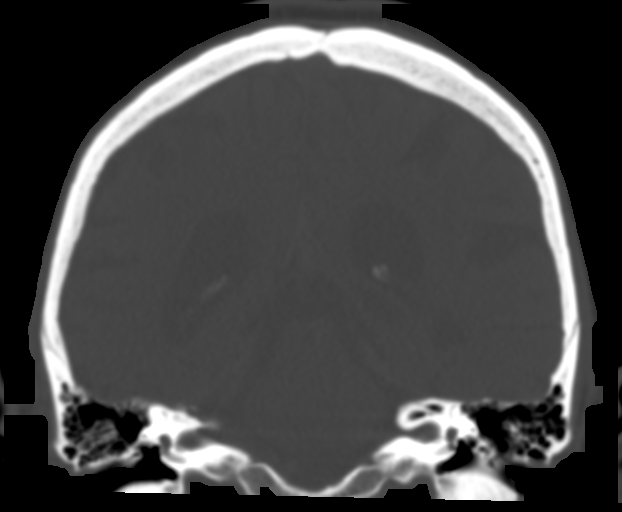
[im 40/66  bone]
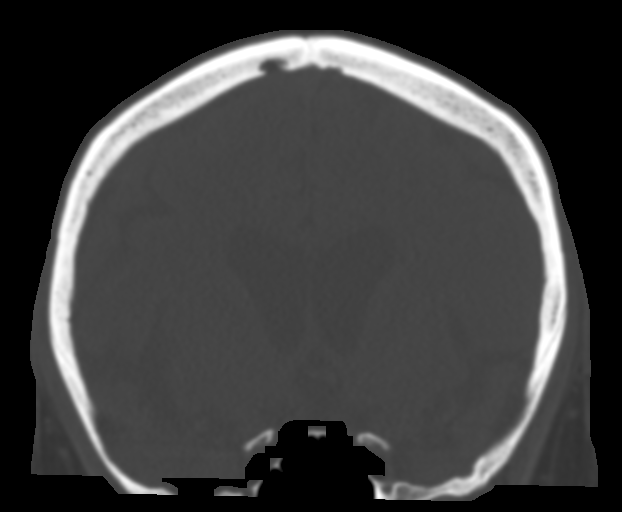

[Series 7: c spine soft · axial · 0.38mm/px · z∈[-190,-114]mm · 3 of 97 slices shown]
[im 20/97  soft-tissue]
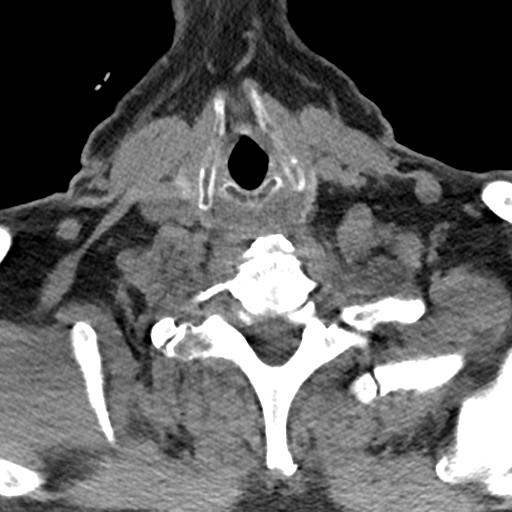
[im 39/97  soft-tissue]
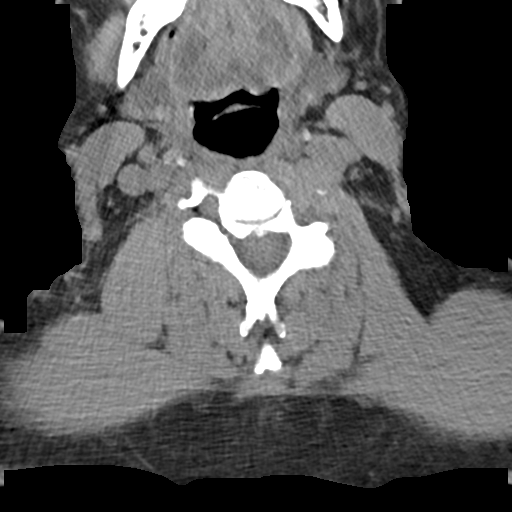
[im 58/97  soft-tissue]
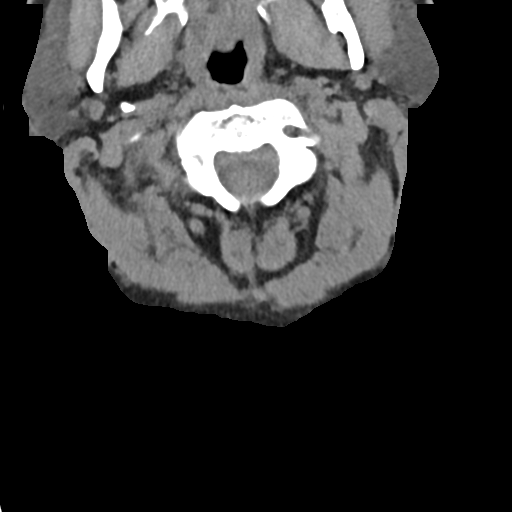

[Series 8: sagittal bone · sagittal · 0.28mm/px · 5 of 64 slices shown]
[im 11/64  bone]
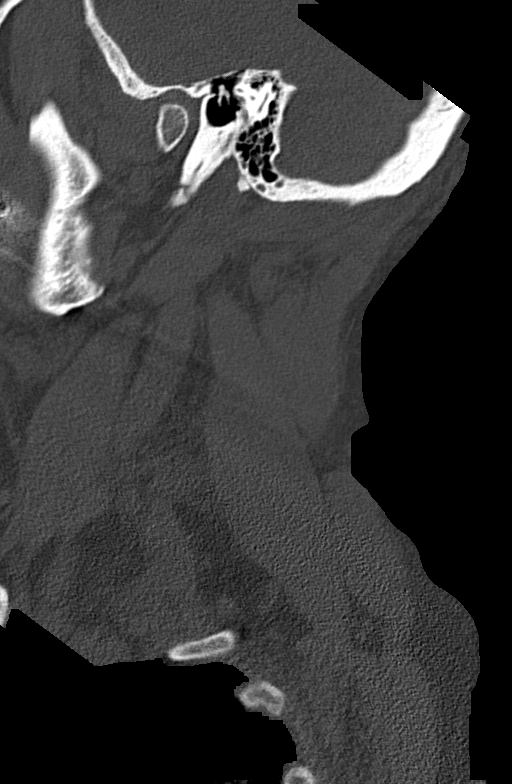
[im 22/64  bone]
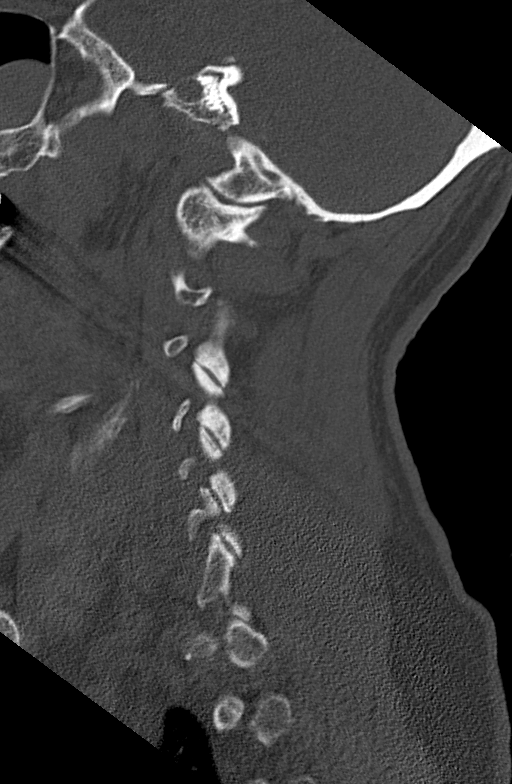
[im 32/64  bone]
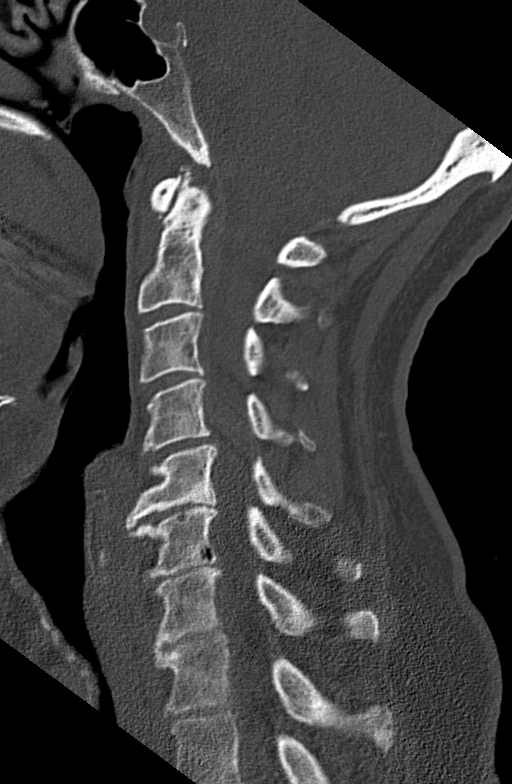
[im 43/64  bone]
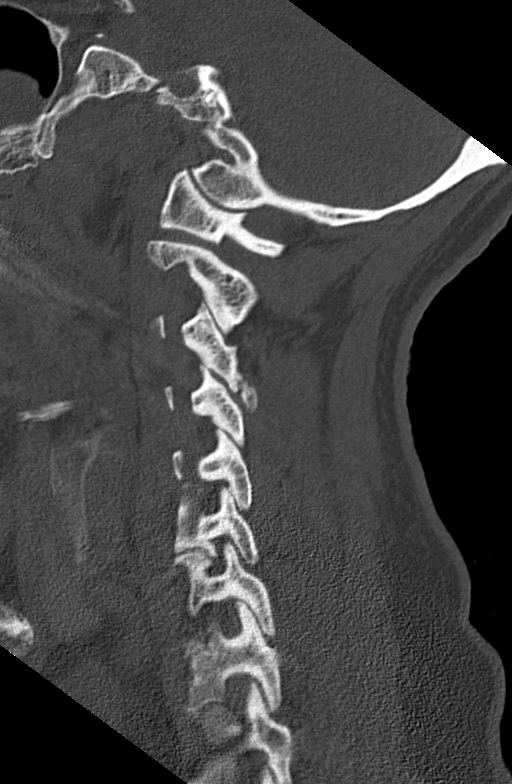
[im 53/64  bone]
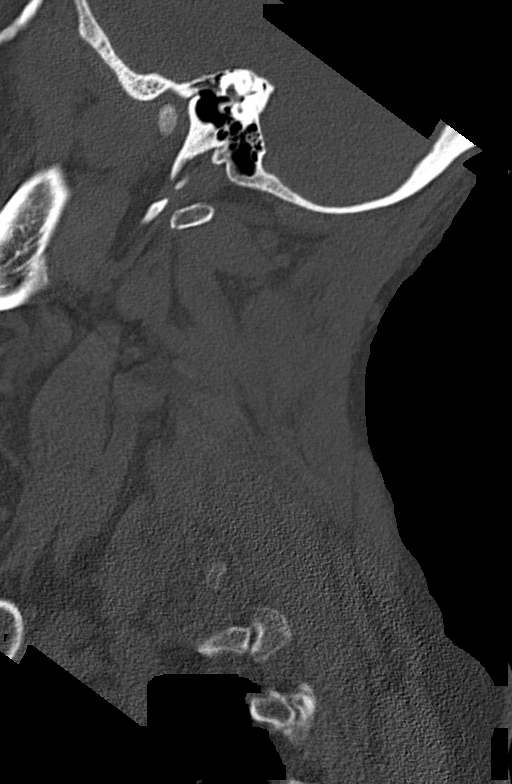

[Series 10: orthogonal bone · axial · 0.24mm/px · z∈[-233,-130]mm · 4 of 108 slices shown, 5 images]
[im 22/108  soft-tissue]
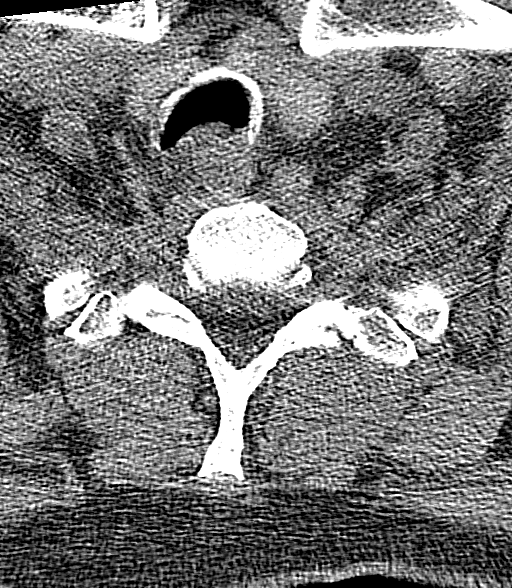
[im 22/108  bone]
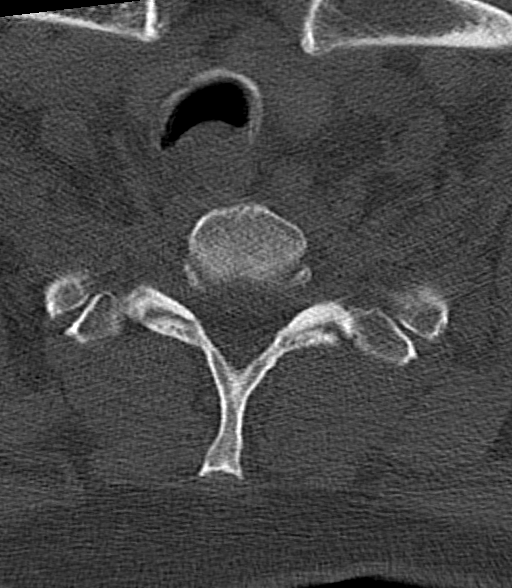
[im 43/108  bone]
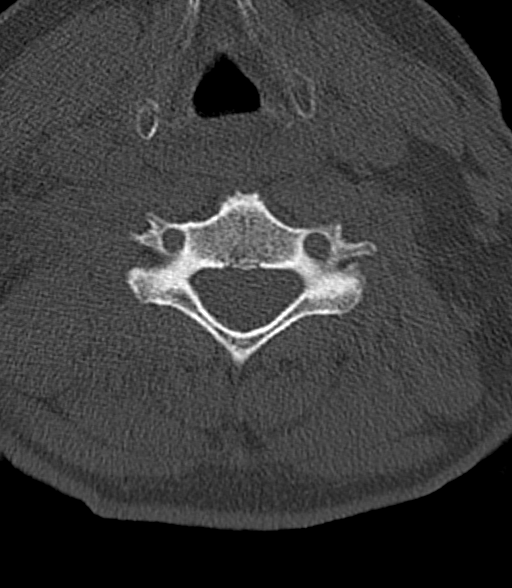
[im 65/108  bone]
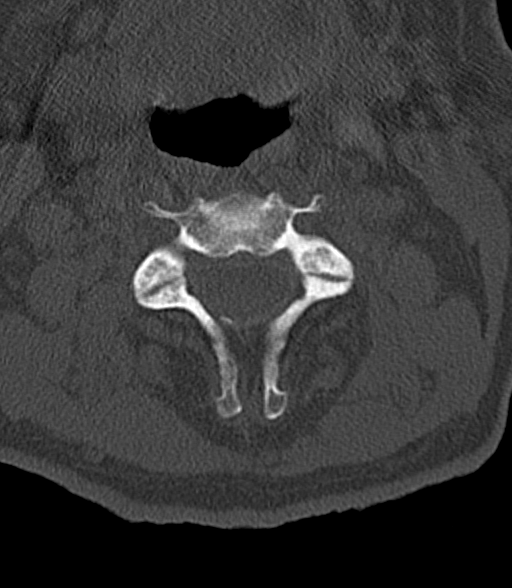
[im 86/108  bone]
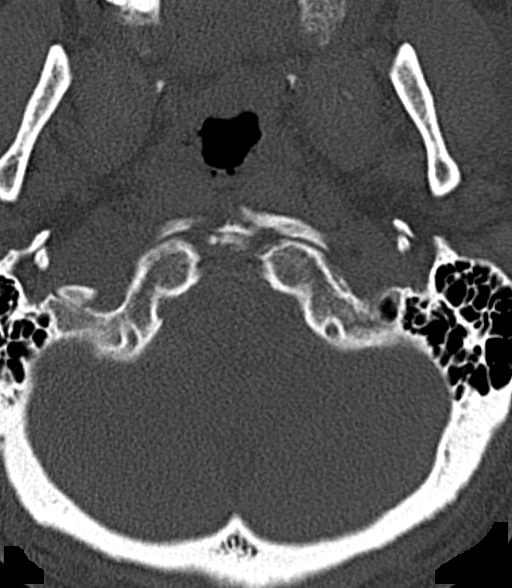

[15 of 33 positions shown; findings below may reference images not displayed]

FINDINGS: CT HEAD FINDINGS

Brain: Mild diffuse cortical atrophy is noted. No mass effect or
midline shift is noted. Ventricular size is within normal limits.
There is no evidence of mass lesion, hemorrhage or acute infarction.

Vascular: No hyperdense vessel or unexpected calcification.

Skull: Normal. Negative for fracture or focal lesion.

Sinuses/Orbits: Mild bilateral ethmoid sinusitis is noted.

Other: None.

CT CERVICAL SPINE FINDINGS

Alignment: Normal.

Skull base and vertebrae: No acute fracture. No primary bone lesion
or focal pathologic process.

Soft tissues and spinal canal: No prevertebral fluid or swelling. No
visible canal hematoma.

Disc levels: Moderate degenerative disc disease is noted at C5-6 and
C6-7 with anterior posterior osteophyte formation.

Upper chest: Negative.

Other: None.
IMPRESSION: Mild diffuse cortical atrophy. No acute intracranial abnormality
seen.

Moderate multilevel degenerative disc disease. No acute abnormality
seen in the cervical spine.
